# Patient Record
Sex: Male | Born: 1965 | Race: Black or African American | Hispanic: No | Marital: Single | Smoking: Current every day smoker
Health system: Southern US, Community
[De-identification: ages and names within clinical notes are randomized; demographics above are authoritative.]

## PROBLEM LIST (undated history)

## (undated) DIAGNOSIS — F329 Major depressive disorder, single episode, unspecified: Secondary | ICD-10-CM

## (undated) DIAGNOSIS — F32A Depression, unspecified: Secondary | ICD-10-CM

## (undated) DIAGNOSIS — I10 Essential (primary) hypertension: Secondary | ICD-10-CM

## (undated) DIAGNOSIS — F1410 Cocaine abuse, uncomplicated: Secondary | ICD-10-CM

## (undated) DIAGNOSIS — Z720 Tobacco use: Secondary | ICD-10-CM

## (undated) DIAGNOSIS — T63301A Toxic effect of unspecified spider venom, accidental (unintentional), initial encounter: Secondary | ICD-10-CM

## (undated) DIAGNOSIS — I219 Acute myocardial infarction, unspecified: Secondary | ICD-10-CM

## (undated) DIAGNOSIS — E785 Hyperlipidemia, unspecified: Secondary | ICD-10-CM

## (undated) DIAGNOSIS — I2510 Atherosclerotic heart disease of native coronary artery without angina pectoris: Secondary | ICD-10-CM

## (undated) HISTORY — DX: Cocaine abuse, uncomplicated: F14.10

## (undated) HISTORY — DX: Hyperlipidemia, unspecified: E78.5

## (undated) HISTORY — DX: Toxic effect of unspecified spider venom, accidental (unintentional), initial encounter: T63.301A

## (undated) HISTORY — DX: Acute myocardial infarction, unspecified: I21.9

## (undated) HISTORY — PX: HEMORRHOID SURGERY: SHX153

## (undated) HISTORY — DX: Atherosclerotic heart disease of native coronary artery without angina pectoris: I25.10

## (undated) HISTORY — DX: Major depressive disorder, single episode, unspecified: F32.9

## (undated) HISTORY — DX: Depression, unspecified: F32.A

## (undated) HISTORY — DX: Tobacco use: Z72.0

## (undated) HISTORY — PX: CARDIAC CATHETERIZATION: SHX172

## (undated) HISTORY — DX: Essential (primary) hypertension: I10

---

## 1997-12-04 ENCOUNTER — Emergency Department (HOSPITAL_COMMUNITY): Admission: EM | Admit: 1997-12-04 | Discharge: 1997-12-04 | Payer: Self-pay | Admitting: Emergency Medicine

## 2008-02-03 ENCOUNTER — Other Ambulatory Visit: Payer: Self-pay

## 2008-02-03 ENCOUNTER — Emergency Department: Payer: Self-pay | Admitting: Emergency Medicine

## 2008-02-06 ENCOUNTER — Emergency Department: Payer: Self-pay | Admitting: Emergency Medicine

## 2010-02-06 ENCOUNTER — Emergency Department: Payer: Self-pay | Admitting: Internal Medicine

## 2010-02-08 ENCOUNTER — Emergency Department: Payer: Self-pay | Admitting: Emergency Medicine

## 2011-08-25 DIAGNOSIS — I219 Acute myocardial infarction, unspecified: Secondary | ICD-10-CM

## 2011-08-25 HISTORY — PX: CARDIAC CATHETERIZATION: SHX172

## 2011-08-25 HISTORY — DX: Acute myocardial infarction, unspecified: I21.9

## 2011-09-01 ENCOUNTER — Inpatient Hospital Stay: Payer: Self-pay | Admitting: Internal Medicine

## 2011-09-01 DIAGNOSIS — I517 Cardiomegaly: Secondary | ICD-10-CM

## 2011-09-01 LAB — URINALYSIS, COMPLETE
Bacteria: NONE SEEN
Bilirubin,UR: NEGATIVE
Blood: NEGATIVE
Glucose,UR: NEGATIVE mg/dL (ref 0–75)
Ketone: NEGATIVE
Leukocyte Esterase: NEGATIVE
Nitrite: NEGATIVE
Ph: 5 (ref 4.5–8.0)
Protein: NEGATIVE
RBC,UR: <1 /HPF (ref 0–5)
Specific Gravity: 1.012 (ref 1.003–1.030)
Squamous Epithelial: <1
WBC UR: <1 /HPF (ref 0–5)

## 2011-09-01 LAB — DRUG SCREEN, URINE
Amphetamines, Ur Screen: NEGATIVE (ref ?–1000)
Barbiturates, Ur Screen: NEGATIVE (ref ?–200)
Benzodiazepine, Ur Scrn: NEGATIVE (ref ?–200)
Cannabinoid 50 Ng, Ur ~~LOC~~: NEGATIVE (ref ?–50)
Cocaine Metabolite,Ur ~~LOC~~: POSITIVE (ref ?–300)
MDMA (Ecstasy)Ur Screen: NEGATIVE (ref ?–500)
Methadone, Ur Screen: NEGATIVE (ref ?–300)
Opiate, Ur Screen: POSITIVE (ref ?–300)
Phencyclidine (PCP) Ur S: NEGATIVE (ref ?–25)
Tricyclic, Ur Screen: NEGATIVE (ref ?–1000)

## 2011-09-01 LAB — CBC
HCT: 44.7 % (ref 40.0–52.0)
HGB: 15.2 g/dL (ref 13.0–18.0)
MCH: 32.5 pg (ref 26.0–34.0)
MCHC: 34 g/dL (ref 32.0–36.0)
MCV: 96 fL (ref 80–100)
Platelet: 253 10*3/uL (ref 150–440)
RBC: 4.68 10*6/uL (ref 4.40–5.90)
RDW: 12.9 % (ref 11.5–14.5)
WBC: 9.6 10*3/uL (ref 3.8–10.6)

## 2011-09-01 LAB — BASIC METABOLIC PANEL
Anion Gap: 6 — ABNORMAL LOW (ref 7–16)
BUN: 9 mg/dL (ref 7–18)
Calcium, Total: 9.2 mg/dL (ref 8.5–10.1)
Chloride: 105 mmol/L (ref 98–107)
Co2: 28 mmol/L (ref 21–32)
Creatinine: 0.92 mg/dL (ref 0.60–1.30)
EGFR (African American): >60
EGFR (Non-African Amer.): >60
Glucose: 93 mg/dL (ref 65–99)
Osmolality: 276 (ref 275–301)
Potassium: 3.7 mmol/L (ref 3.5–5.1)
Sodium: 139 mmol/L (ref 136–145)

## 2011-09-01 LAB — CK TOTAL AND CKMB (NOT AT ARMC)
CK, Total: 349 U/L — ABNORMAL HIGH (ref 35–232)
CK, Total: 328 U/L — ABNORMAL HIGH (ref 35–232)
CK-MB: 21.2 ng/mL — ABNORMAL HIGH (ref 0.5–3.6)
CK-MB: 19.7 ng/mL — ABNORMAL HIGH (ref 0.5–3.6)

## 2011-09-01 LAB — APTT
Activated PTT: 35.8 secs (ref 23.6–35.9)
Activated PTT: 78.4 secs — ABNORMAL HIGH (ref 23.6–35.9)

## 2011-09-01 LAB — TROPONIN I
Troponin-I: 4.47 ng/mL — ABNORMAL HIGH
Troponin-I: 5.27 ng/mL — ABNORMAL HIGH

## 2011-09-02 DIAGNOSIS — I2510 Atherosclerotic heart disease of native coronary artery without angina pectoris: Secondary | ICD-10-CM

## 2011-09-02 LAB — LIPID PANEL
Cholesterol: 154 mg/dL (ref 0–200)
HDL Cholesterol: 28 mg/dL — ABNORMAL LOW (ref 40–60)
Ldl Cholesterol, Calc: 96 mg/dL (ref 0–100)
Triglycerides: 151 mg/dL (ref 0–200)
VLDL Cholesterol, Calc: 30 mg/dL (ref 5–40)

## 2011-09-02 LAB — TROPONIN I: Troponin-I: 5.95 ng/mL — ABNORMAL HIGH

## 2011-09-02 LAB — CK TOTAL AND CKMB (NOT AT ARMC)
CK, Total: 251 U/L — ABNORMAL HIGH (ref 35–232)
CK-MB: 12.3 ng/mL — ABNORMAL HIGH (ref 0.5–3.6)

## 2011-09-02 LAB — APTT
Activated PTT: 63.4 secs — ABNORMAL HIGH (ref 23.6–35.9)
Activated PTT: 56.7 secs — ABNORMAL HIGH (ref 23.6–35.9)

## 2011-09-02 LAB — MAGNESIUM: Magnesium: 2 mg/dL

## 2011-09-02 LAB — PROTIME-INR
INR: 0.9
Prothrombin Time: 12.3 secs (ref 11.5–14.7)

## 2011-09-02 LAB — HEMOGLOBIN A1C: Hemoglobin A1C: 5.3 % (ref 4.2–6.3)

## 2011-09-03 DIAGNOSIS — I214 Non-ST elevation (NSTEMI) myocardial infarction: Secondary | ICD-10-CM

## 2011-09-03 LAB — BASIC METABOLIC PANEL
Anion Gap: 6 — ABNORMAL LOW (ref 7–16)
BUN: 14 mg/dL (ref 7–18)
Calcium, Total: 8.3 mg/dL — ABNORMAL LOW (ref 8.5–10.1)
Chloride: 108 mmol/L — ABNORMAL HIGH (ref 98–107)
Co2: 26 mmol/L (ref 21–32)
Creatinine: 0.97 mg/dL (ref 0.60–1.30)
EGFR (African American): >60
EGFR (Non-African Amer.): >60
Glucose: 91 mg/dL (ref 65–99)
Osmolality: 279 (ref 275–301)
Potassium: 4 mmol/L (ref 3.5–5.1)
Sodium: 140 mmol/L (ref 136–145)

## 2011-09-03 LAB — CK TOTAL AND CKMB (NOT AT ARMC)
CK, Total: 151 U/L (ref 35–232)
CK-MB: 3.6 ng/mL (ref 0.5–3.6)

## 2011-09-05 LAB — COMPREHENSIVE METABOLIC PANEL
Albumin: 3.6 g/dL (ref 3.4–5.0)
Alkaline Phosphatase: 92 U/L (ref 50–136)
Anion Gap: 5 — ABNORMAL LOW (ref 7–16)
BUN: 11 mg/dL (ref 7–18)
Bilirubin,Total: 0.4 mg/dL (ref 0.2–1.0)
Calcium, Total: 9.3 mg/dL (ref 8.5–10.1)
Chloride: 107 mmol/L (ref 98–107)
Co2: 29 mmol/L (ref 21–32)
Creatinine: 1.16 mg/dL (ref 0.60–1.30)
EGFR (African American): >60
EGFR (Non-African Amer.): >60
Glucose: 78 mg/dL (ref 65–99)
Osmolality: 280 (ref 275–301)
Potassium: 3.9 mmol/L (ref 3.5–5.1)
SGOT(AST): 22 U/L (ref 15–37)
SGPT (ALT): 25 U/L
Sodium: 141 mmol/L (ref 136–145)
Total Protein: 7.6 g/dL (ref 6.4–8.2)

## 2011-09-05 LAB — CK TOTAL AND CKMB (NOT AT ARMC)
CK, Total: 105 U/L (ref 35–232)
CK-MB: <0.5 ng/mL — ABNORMAL LOW (ref 0.5–3.6)

## 2011-09-05 LAB — DRUG SCREEN, URINE

## 2011-09-05 LAB — CBC
HCT: 38.6 % — ABNORMAL LOW (ref 40.0–52.0)
HGB: 13.3 g/dL (ref 13.0–18.0)
MCH: 32.8 pg (ref 26.0–34.0)
MCHC: 34.4 g/dL (ref 32.0–36.0)
MCV: 95 fL (ref 80–100)
Platelet: 224 10*3/uL (ref 150–440)
RBC: 4.05 10*6/uL — ABNORMAL LOW (ref 4.40–5.90)
RDW: 13 % (ref 11.5–14.5)
WBC: 7.3 10*3/uL (ref 3.8–10.6)

## 2011-09-05 LAB — TROPONIN I
Troponin-I: 0.82 ng/mL — ABNORMAL HIGH
Troponin-I: 0.92 ng/mL — ABNORMAL HIGH

## 2011-09-05 LAB — PROTIME-INR
INR: 0.9
Prothrombin Time: 12.5 secs (ref 11.5–14.7)

## 2011-09-05 LAB — PRO B NATRIURETIC PEPTIDE: B-Type Natriuretic Peptide: 50 pg/mL (ref 0–125)

## 2011-09-06 ENCOUNTER — Inpatient Hospital Stay: Payer: Self-pay | Admitting: Internal Medicine

## 2011-09-06 DIAGNOSIS — R079 Chest pain, unspecified: Secondary | ICD-10-CM

## 2011-09-06 LAB — CK TOTAL AND CKMB (NOT AT ARMC)
CK, Total: 96 U/L (ref 35–232)
CK-MB: <0.5 ng/mL — ABNORMAL LOW (ref 0.5–3.6)

## 2011-09-06 LAB — APTT
Activated PTT: 36.3 secs — ABNORMAL HIGH (ref 23.6–35.9)
Activated PTT: 68.3 secs — ABNORMAL HIGH (ref 23.6–35.9)

## 2011-09-06 LAB — TROPONIN I: Troponin-I: 0.91 ng/mL — ABNORMAL HIGH

## 2011-09-08 ENCOUNTER — Encounter: Payer: Self-pay | Admitting: Cardiovascular Disease

## 2011-09-13 ENCOUNTER — Encounter: Payer: Self-pay | Admitting: Cardiovascular Disease

## 2011-09-13 ENCOUNTER — Ambulatory Visit (INDEPENDENT_AMBULATORY_CARE_PROVIDER_SITE_OTHER): Payer: Self-pay | Admitting: Cardiovascular Disease

## 2011-09-13 DIAGNOSIS — I10 Essential (primary) hypertension: Secondary | ICD-10-CM

## 2011-09-13 DIAGNOSIS — R079 Chest pain, unspecified: Secondary | ICD-10-CM

## 2011-09-13 DIAGNOSIS — R0602 Shortness of breath: Secondary | ICD-10-CM

## 2011-09-13 DIAGNOSIS — F1410 Cocaine abuse, uncomplicated: Secondary | ICD-10-CM

## 2011-09-13 DIAGNOSIS — Z720 Tobacco use: Secondary | ICD-10-CM

## 2011-09-13 DIAGNOSIS — F17200 Nicotine dependence, unspecified, uncomplicated: Secondary | ICD-10-CM

## 2011-09-13 DIAGNOSIS — E785 Hyperlipidemia, unspecified: Secondary | ICD-10-CM

## 2011-09-13 DIAGNOSIS — I2510 Atherosclerotic heart disease of native coronary artery without angina pectoris: Secondary | ICD-10-CM

## 2011-09-13 NOTE — Patient Instructions (Signed)
Increase Lisinopril to 40 mg once daily.  Increase Metoprolol to 25 mg twice daily.  Start Plavix 75 mg once daily once you finish Effient.   Follow up in 3 month.

## 2011-09-13 NOTE — Assessment & Plan Note (Signed)
The patient seems to be doing reasonably well after recent angioplasty in bare-metal stent placement. He had non-ST elevation myocardial infarction. Ejection fraction was normal. I had a prolonged discussion with him again about the importance of making positive changes in his life. He understands the risk of recurrent ischemic events if he does not improve his lifestyle. I recommend continuing dual antiplatelet therapy for a minimum of one month. However, he would benefit from continuing that for at least a year given that he did have non-ST elevation myocardial infarction. He is now on Effient 10 mg once daily. He will finish his supplies in about 2 weeks. After that, he can switch to Plavix 75 mg once daily which should be continued for 11 months. I asked him to start an exercise program. He can return to work in one week. He is currently unemployed.

## 2011-09-13 NOTE — Assessment & Plan Note (Signed)
I advised him to quit smoking but his prior to her to quit cocaine and alcohol use which he already did.

## 2011-09-13 NOTE — Assessment & Plan Note (Signed)
His blood pressure is still elevated. I recommend increasing the dose of metoprolol to 25 mg twice daily and the dose of lisinopril to 40 mg once daily.

## 2011-09-13 NOTE — Assessment & Plan Note (Signed)
Continue treatment with Pravachol once daily.

## 2011-09-13 NOTE — Assessment & Plan Note (Signed)
The patient quit cocaine use after his myocardial infarction. I explained to him the dangers of resuming that and interaction with some of his medications.

## 2011-09-13 NOTE — Progress Notes (Signed)
HPI   this is a 46 year old male who is here today for a followup visit. He presented early this month to [facility] with non-ST elevation myocardial infarction in the setting of cocaine use. He underwent cardiac catheterization which showed a 99% mid left circumflex stenosis with a large thrombus. There was mild disease in the LAD and RCA with normal ejection fraction. He underwent an angioplasty in bare-metal stent placement to the mid left circumflex without complications. He presented 2 days after discharge with recurrent chest pain but there was no new ECG changes and cardiac enzymes were trending down from his previous myocardial infarction. He was discharged home. He feels significantly better. He stopped using cocaine and alcohol and is trying to quit smoking. He has been taking his medications as prescribed. He still gets occasional chest tightness feeling which is mostly at rest and does not worsen with physical activities.  Allergies  Allergen Reactions  . Penicillins     rash     Current Outpatient Prescriptions on File Prior to Visit  Medication Sig Dispense Refill  . aspirin 81 MG tablet Take 81 mg by mouth daily.      Marland Kitchen lisinopril (PRINIVIL,ZESTRIL) 20 MG tablet Take 20 mg by mouth daily.      . prasugrel (EFFIENT) 10 MG TABS Take by mouth daily.      . pravastatin (PRAVACHOL) 20 MG tablet Take 20 mg by mouth daily.         Past Medical History  Diagnosis Date  . Acute MI MAY 2013  . CAD (coronary artery disease)     NSTEMI in 05/13 in setting of Cocaine use. Cath: 99% mid LCX stenosis with a large thrombus. PCI and BMS (4.0 X15 mm Vision) placement to mid LCX, LAD: 20%, RCA: 30%, EF: 60%.   . Hyperlipidemia   . Hypertension   . Tobacco abuse   . Cocaine abuse     quit in 08/2011     Past Surgical History  Procedure Date  . Hemorrhoid surgery   . Cardiac catheterization MAY 2013    s/p stent @ [facility]     Family History  Problem Relation Age of Onset  . Heart  attack Maternal Grandfather   . Heart attack Paternal Grandfather      History   Social History  . Marital Status: Married    Spouse Name: N/A    Number of Children: N/A  . Years of Education: N/A   Occupational History  . Not on file.   Social History Main Topics  . Smoking status: Current Everyday Smoker -- 0.2 packs/day for 30 years    Types: Cigarettes  . Smokeless tobacco: Not on file  . Alcohol Use: No  . Drug Use: No     used cocaine 15-20 years  . Sexually Active: Yes    Birth Control/ Protection: Other-see comments   Other Topics Concern  . Not on file   Social History Narrative  . No narrative on file     PHYSICAL EXAM   BP 143/93  Pulse 61  Ht 5\' 10"  (1.778 m)  Wt 156 lb (70.761 kg)  BMI 22.38 kg/m2 Constitutional: He is oriented to person, place, and time. He appears well-developed and well-nourished. No distress.  HENT: No nasal discharge.  Head: Normocephalic and atraumatic.  Eyes: Pupils are equal and round. Right eye exhibits no discharge. Left eye exhibits no discharge.  Neck: Normal range of motion. Neck supple. No JVD present. No thyromegaly  present.  Cardiovascular: Normal rate, regular rhythm, normal heart sounds and. Exam reveals no gallop and no friction rub. No murmur heard.  Pulmonary/Chest: Effort normal and breath sounds normal. No stridor. No respiratory distress. He has no wheezes. He has no rales. He exhibits no tenderness.  Abdominal: Soft. Bowel sounds are normal. He exhibits no distension. There is no tenderness. There is no rebound and no guarding.  Musculoskeletal: Normal range of motion. He exhibits no edema and no tenderness.  Neurological: He is alert and oriented to person, place, and time. Coordination normal.  Skin: Skin is warm and dry. No rash noted. He is not diaphoretic. No erythema. No pallor.  Psychiatric: He has a normal mood and affect. His behavior is normal. Judgment and thought content normal.  Right radial  pulse is normal with no hematoma.     EKG: Sinus  Rhythm  -  Negative T-waves  -Possible  Anterolateral  ischemia. (chronic changes).    ASSESSMENT AND PLAN

## 2011-10-26 ENCOUNTER — Emergency Department: Payer: Self-pay | Admitting: Emergency Medicine

## 2011-10-26 LAB — DRUG SCREEN, URINE
Amphetamines, Ur Screen: NEGATIVE (ref ?–1000)
Barbiturates, Ur Screen: NEGATIVE (ref ?–200)
Benzodiazepine, Ur Scrn: NEGATIVE (ref ?–200)
Cannabinoid 50 Ng, Ur ~~LOC~~: NEGATIVE (ref ?–50)
Cocaine Metabolite,Ur ~~LOC~~: POSITIVE (ref ?–300)
MDMA (Ecstasy)Ur Screen: NEGATIVE (ref ?–500)
Methadone, Ur Screen: NEGATIVE (ref ?–300)
Opiate, Ur Screen: NEGATIVE (ref ?–300)
Phencyclidine (PCP) Ur S: NEGATIVE (ref ?–25)
Tricyclic, Ur Screen: NEGATIVE (ref ?–1000)

## 2011-10-26 LAB — COMPREHENSIVE METABOLIC PANEL
Albumin: 3.7 g/dL (ref 3.4–5.0)
Alkaline Phosphatase: 80 U/L (ref 50–136)
Anion Gap: 7 (ref 7–16)
BUN: 12 mg/dL (ref 7–18)
Bilirubin,Total: 1.2 mg/dL — ABNORMAL HIGH (ref 0.2–1.0)
Calcium, Total: 8.9 mg/dL (ref 8.5–10.1)
Chloride: 107 mmol/L (ref 98–107)
Co2: 27 mmol/L (ref 21–32)
Creatinine: 1.12 mg/dL (ref 0.60–1.30)
EGFR (African American): >60
EGFR (Non-African Amer.): >60
Glucose: 146 mg/dL — ABNORMAL HIGH (ref 65–99)
Osmolality: 284 (ref 275–301)
Potassium: 3 mmol/L — ABNORMAL LOW (ref 3.5–5.1)
SGOT(AST): 17 U/L (ref 15–37)
SGPT (ALT): 15 U/L
Sodium: 141 mmol/L (ref 136–145)
Total Protein: 7 g/dL (ref 6.4–8.2)

## 2011-10-26 LAB — CBC
HCT: 39.8 % — ABNORMAL LOW (ref 40.0–52.0)
HGB: 13.5 g/dL (ref 13.0–18.0)
MCH: 32.2 pg (ref 26.0–34.0)
MCHC: 33.9 g/dL (ref 32.0–36.0)
MCV: 95 fL (ref 80–100)
Platelet: 222 10*3/uL (ref 150–440)
RBC: 4.19 10*6/uL — ABNORMAL LOW (ref 4.40–5.90)
RDW: 13.6 % (ref 11.5–14.5)
WBC: 7.3 10*3/uL (ref 3.8–10.6)

## 2011-10-26 LAB — TROPONIN I
Troponin-I: <0.02 ng/mL
Troponin-I: <0.02 ng/mL

## 2011-10-26 LAB — CK TOTAL AND CKMB (NOT AT ARMC)
CK, Total: 151 U/L (ref 35–232)
CK-MB: 0.8 ng/mL (ref 0.5–3.6)

## 2011-10-26 LAB — PROTIME-INR
INR: 0.9
Prothrombin Time: 12.6 secs (ref 11.5–14.7)

## 2011-12-09 ENCOUNTER — Encounter: Payer: Self-pay | Admitting: Cardiovascular Disease

## 2011-12-09 ENCOUNTER — Ambulatory Visit (INDEPENDENT_AMBULATORY_CARE_PROVIDER_SITE_OTHER): Payer: Medicaid Other | Admitting: Cardiovascular Disease

## 2011-12-09 VITALS — BP 128/96 | HR 55 | Ht 70.0 in | Wt 156.0 lb

## 2011-12-09 DIAGNOSIS — E785 Hyperlipidemia, unspecified: Secondary | ICD-10-CM

## 2011-12-09 DIAGNOSIS — I10 Essential (primary) hypertension: Secondary | ICD-10-CM

## 2011-12-09 DIAGNOSIS — I2510 Atherosclerotic heart disease of native coronary artery without angina pectoris: Secondary | ICD-10-CM

## 2011-12-09 DIAGNOSIS — R079 Chest pain, unspecified: Secondary | ICD-10-CM

## 2011-12-09 DIAGNOSIS — R0602 Shortness of breath: Secondary | ICD-10-CM

## 2011-12-09 NOTE — Patient Instructions (Addendum)
Your physician has requested that you have an echocardiogram. Echocardiography is a painless test that uses sound waves to create images of your heart. It provides your doctor with information about the size and shape of your heart and how well your heart's chambers and valves are working. This procedure takes approximately one hour. There are no restrictions for this procedure.  Your physician recommends that you continue on your current medications as directed. Please refer to the Current Medication list given to you today.  Your physician recommends that you schedule a follow-up appointment in: 3 months with Dr. Kirke Corin

## 2011-12-10 ENCOUNTER — Encounter: Payer: Self-pay | Admitting: Cardiovascular Disease

## 2011-12-10 DIAGNOSIS — R0602 Shortness of breath: Secondary | ICD-10-CM | POA: Insufficient documentation

## 2011-12-10 LAB — BASIC METABOLIC PANEL
BUN/Creatinine Ratio: 14 (ref 9–20)
BUN: 14 mg/dL (ref 6–24)
CO2: 21 mmol/L (ref 19–28)
Calcium: 9.3 mg/dL (ref 8.7–10.2)
Chloride: 102 mmol/L (ref 97–108)
Creatinine, Ser: 1.02 mg/dL (ref 0.76–1.27)
GFR calc Af Amer: 100 mL/min/{1.73_m2} (ref >59)
GFR calc non Af Amer: 87 mL/min/{1.73_m2} (ref >59)
Glucose: 76 mg/dL (ref 65–99)
Potassium: 3.8 mmol/L (ref 3.5–5.2)
Sodium: 138 mmol/L (ref 134–144)

## 2011-12-10 LAB — LIPID PANEL
Chol/HDL Ratio: 4.4 ratio units (ref 0.0–5.0)
Cholesterol, Total: 174 mg/dL (ref 100–199)
HDL: 40 mg/dL (ref >39)
LDL Calculated: 114 mg/dL — ABNORMAL HIGH (ref 0–99)
Triglycerides: 101 mg/dL (ref 0–149)
VLDL Cholesterol Cal: 20 mg/dL (ref 5–40)

## 2011-12-10 LAB — HEPATIC FUNCTION PANEL
ALT: 12 IU/L (ref 0–44)
AST: 18 IU/L (ref 0–40)
Albumin: 4.4 g/dL (ref 3.5–5.5)
Alkaline Phosphatase: 72 IU/L (ref 44–102)
Bilirubin, Direct: 0.28 mg/dL (ref 0.00–0.40)
Total Bilirubin: 1.2 mg/dL (ref 0.0–1.2)
Total Protein: 7 g/dL (ref 6.0–8.5)

## 2011-12-10 NOTE — Assessment & Plan Note (Signed)
The patient seems to be doing reasonably well . Ejection fraction was normal. Continue dual antiplatelet therapy for one year. No clear chest pain but does complain of increased dyspnea.

## 2011-12-10 NOTE — Progress Notes (Signed)
HPI  this is a 46 year old male who is here today for a followup visit. He presented in May of this year to [facility] with non-ST elevation myocardial infarction in the setting of cocaine use. He underwent cardiac catheterization which showed a 99% mid left circumflex stenosis with a large thrombus. There was mild disease in the LAD and RCA with normal ejection fraction. He underwent an angioplasty and bare-metal stent placement to the mid left circumflex without complications. He presented 2 days after discharge with recurrent chest pain but there was no new ECG changes and cardiac enzymes were trending down from his previous myocardial infarction. He was discharged home. No events since then. He stopped using cocaine and alcohol and is trying to quit smoking. He has been taking his medications as prescribed. He was able to get Medicaid recently. Overall, his chest pain is now very rare. However, he complains of increased dyspnea and fatigue. It seems that he has been taking 2 different prescriptions for metoprolol by mistake. One of them was for 25 mg twice daily and the other was for 50 mg twice daily.   Allergies  Allergen Reactions  . Penicillins     rash     Current Outpatient Prescriptions on File Prior to Visit  Medication Sig Dispense Refill  . aspirin 81 MG tablet Take 81 mg by mouth daily.      . pravastatin (PRAVACHOL) 20 MG tablet Take 20 mg by mouth daily.         Past Medical History  Diagnosis Date  . Acute MI MAY 2013  . CAD (coronary artery disease)     NSTEMI in 05/13 in setting of Cocaine use. Cath: 99% mid LCX stenosis with a large thrombus. PCI and BMS (4.0 X15 mm Vision) placement to mid LCX, LAD: 20%, RCA: 30%, EF: 60%.   . Hyperlipidemia   . Hypertension   . Tobacco abuse   . Cocaine abuse     quit in 08/2011  . Spider bite      Past Surgical History  Procedure Date  . Hemorrhoid surgery   . Cardiac catheterization MAY 2013    s/p stent @ [facility]      Family History  Problem Relation Age of Onset  . Heart attack Maternal Grandfather   . Heart attack Paternal Grandfather      History   Social History  . Marital Status: Married    Spouse Name: N/A    Number of Children: N/A  . Years of Education: N/A   Occupational History  . Not on file.   Social History Main Topics  . Smoking status: Current Everyday Smoker -- 0.2 packs/day for 30 years    Types: Cigarettes  . Smokeless tobacco: Not on file  . Alcohol Use: 0.0 oz/week    1-2 Cans of beer per week  . Drug Use: No     used cocaine 15-20 years  . Sexually Active: Yes    Birth Control/ Protection: Other-see comments   Other Topics Concern  . Not on file   Social History Narrative  . No narrative on file     PHYSICAL EXAM   BP 128/96  Pulse 55  Ht 5\' 10"  (1.778 m)  Wt 156 lb (70.761 kg)  BMI 22.38 kg/m2  Constitutional: He is oriented to person, place, and time. He appears well-developed and well-nourished. No distress.  HENT: No nasal discharge.  Head: Normocephalic and atraumatic.  Eyes: Pupils are equal and round. Right  eye exhibits no discharge. Left eye exhibits no discharge.  Neck: Normal range of motion. Neck supple. No JVD present. No thyromegaly present.  Cardiovascular: Normal rate, regular rhythm, normal heart sounds and. Exam reveals no gallop and no friction rub. No murmur heard.  Pulmonary/Chest: Effort normal and breath sounds normal. No stridor. No respiratory distress. He has no wheezes. He has no rales. He exhibits no tenderness.  Abdominal: Soft. Bowel sounds are normal. He exhibits no distension. There is no tenderness. There is no rebound and no guarding.  Musculoskeletal: Normal range of motion. He exhibits no edema and no tenderness.  Neurological: He is alert and oriented to person, place, and time. Coordination normal.  Skin: Skin is warm and dry. No rash noted. He is not diaphoretic. No erythema. No pallor.  Psychiatric: He has  a normal mood and affect. His behavior is normal. Judgment and thought content normal.      EKG: Sinus  Bradycardia  -RSR(V1) -nondiagnostic.   Voltage criteria for LVH  (R(V5) exceeds 2.60 mV).   -  Nonspecific T-abnormality.   ABNORMAL    ASSESSMENT AND PLAN

## 2011-12-10 NOTE — Assessment & Plan Note (Signed)
I recommend fasting lipid and liver profile. He will likely need a more potent statin than pravastatin.

## 2011-12-10 NOTE — Assessment & Plan Note (Signed)
His blood pressure is well controlled but he is mildly bradycardic. He has been taking 75 mg twice daily of Lopressor by mistake. I will decrease the dose back to 50 mg twice daily.

## 2011-12-10 NOTE — Assessment & Plan Note (Addendum)
He reports increased dyspnea and fatigue. I will obtain an echocardiogram.

## 2011-12-13 ENCOUNTER — Other Ambulatory Visit: Payer: Medicaid Other

## 2011-12-14 ENCOUNTER — Encounter: Payer: Self-pay | Admitting: Cardiovascular Disease

## 2011-12-16 ENCOUNTER — Telehealth: Payer: Self-pay

## 2011-12-16 DIAGNOSIS — Z79899 Other long term (current) drug therapy: Secondary | ICD-10-CM

## 2011-12-16 DIAGNOSIS — E785 Hyperlipidemia, unspecified: Secondary | ICD-10-CM

## 2011-12-16 MED ORDER — ATORVASTATIN CALCIUM 40 MG PO TABS: 40.0000 mg | ORAL_TABLET | Freq: Every day | ORAL | Status: DC

## 2011-12-16 NOTE — Telephone Encounter (Signed)
Message copied by Festus Aloe on Thu Dec 16, 2011  2:05 PM ------      Message from: Lorine Bears A      Created: Fri Dec 10, 2011 11:25 AM       Inform patient that labs were normal. Cholesterol was high and not at target. Stop pravastatin. Start atorvastatin 40 mg at bedtime. 6 refills. Repeat fasting lipid and liver profile in 6 weeks.

## 2011-12-16 NOTE — Telephone Encounter (Signed)
Rx sent to [facility] for Atorvastatin 40 mg take one tablet at bedtime.

## 2012-01-10 ENCOUNTER — Other Ambulatory Visit: Payer: Medicaid Other

## 2012-01-10 DIAGNOSIS — R0989 Other specified symptoms and signs involving the circulatory and respiratory systems: Secondary | ICD-10-CM

## 2012-01-18 ENCOUNTER — Emergency Department: Payer: Self-pay | Admitting: Emergency Medicine

## 2012-01-18 LAB — BASIC METABOLIC PANEL
Anion Gap: 6 — ABNORMAL LOW (ref 7–16)
BUN: 10 mg/dL (ref 7–18)
Calcium, Total: 9.2 mg/dL (ref 8.5–10.1)
Chloride: 107 mmol/L (ref 98–107)
Co2: 29 mmol/L (ref 21–32)
Creatinine: 1.09 mg/dL (ref 0.60–1.30)
EGFR (African American): >60
EGFR (Non-African Amer.): >60
Glucose: 126 mg/dL — ABNORMAL HIGH (ref 65–99)
Osmolality: 284 (ref 275–301)
Potassium: 3.5 mmol/L (ref 3.5–5.1)
Sodium: 142 mmol/L (ref 136–145)

## 2012-01-18 LAB — URINALYSIS, COMPLETE
Bacteria: NONE SEEN
Bilirubin,UR: NEGATIVE
Blood: NEGATIVE
Glucose,UR: NEGATIVE mg/dL (ref 0–75)
Ketone: NEGATIVE
Leukocyte Esterase: NEGATIVE
Nitrite: NEGATIVE
Ph: 6 (ref 4.5–8.0)
Protein: NEGATIVE
RBC,UR: NONE SEEN /HPF (ref 0–5)
Specific Gravity: 1.008 (ref 1.003–1.030)
Squamous Epithelial: <1
WBC UR: NONE SEEN /HPF (ref 0–5)

## 2012-01-18 LAB — CBC
HCT: 42.5 % (ref 40.0–52.0)
HGB: 14.7 g/dL (ref 13.0–18.0)
MCH: 32.6 pg (ref 26.0–34.0)
MCHC: 34.5 g/dL (ref 32.0–36.0)
MCV: 94 fL (ref 80–100)
Platelet: 249 10*3/uL (ref 150–440)
RBC: 4.5 10*6/uL (ref 4.40–5.90)
RDW: 13.5 % (ref 11.5–14.5)
WBC: 7.3 10*3/uL (ref 3.8–10.6)

## 2012-01-27 ENCOUNTER — Encounter: Payer: Self-pay | Admitting: Cardiovascular Disease

## 2012-01-28 LAB — CBC
HCT: 40.4 % (ref 40.0–52.0)
HGB: 14.1 g/dL (ref 13.0–18.0)
MCH: 33.2 pg (ref 26.0–34.0)
MCHC: 35 g/dL (ref 32.0–36.0)
MCV: 95 fL (ref 80–100)
Platelet: 251 10*3/uL (ref 150–440)
RBC: 4.25 10*6/uL — ABNORMAL LOW (ref 4.40–5.90)
RDW: 13 % (ref 11.5–14.5)
WBC: 7.6 10*3/uL (ref 3.8–10.6)

## 2012-01-28 LAB — COMPREHENSIVE METABOLIC PANEL
Albumin: 3.6 g/dL (ref 3.4–5.0)
Alkaline Phosphatase: 89 U/L (ref 50–136)
Anion Gap: 10 (ref 7–16)
BUN: 12 mg/dL (ref 7–18)
Bilirubin,Total: 0.8 mg/dL (ref 0.2–1.0)
Calcium, Total: 8.9 mg/dL (ref 8.5–10.1)
Chloride: 107 mmol/L (ref 98–107)
Co2: 25 mmol/L (ref 21–32)
Creatinine: 1.08 mg/dL (ref 0.60–1.30)
EGFR (African American): >60
EGFR (Non-African Amer.): >60
Glucose: 73 mg/dL (ref 65–99)
Osmolality: 281 (ref 275–301)
Potassium: 3.6 mmol/L (ref 3.5–5.1)
SGOT(AST): 20 U/L (ref 15–37)
SGPT (ALT): 13 U/L (ref 12–78)
Sodium: 142 mmol/L (ref 136–145)
Total Protein: 7 g/dL (ref 6.4–8.2)

## 2012-01-28 LAB — DRUG SCREEN, URINE
Amphetamines, Ur Screen: NEGATIVE (ref ?–1000)
Barbiturates, Ur Screen: NEGATIVE (ref ?–200)
Benzodiazepine, Ur Scrn: NEGATIVE (ref ?–200)
Cannabinoid 50 Ng, Ur ~~LOC~~: NEGATIVE (ref ?–50)
Cocaine Metabolite,Ur ~~LOC~~: POSITIVE (ref ?–300)
MDMA (Ecstasy)Ur Screen: NEGATIVE (ref ?–500)
Methadone, Ur Screen: NEGATIVE (ref ?–300)
Opiate, Ur Screen: NEGATIVE (ref ?–300)
Phencyclidine (PCP) Ur S: NEGATIVE (ref ?–25)
Tricyclic, Ur Screen: NEGATIVE (ref ?–1000)

## 2012-01-28 LAB — URINALYSIS, COMPLETE
Bacteria: NONE SEEN
Bilirubin,UR: NEGATIVE
Blood: NEGATIVE
Glucose,UR: NEGATIVE mg/dL (ref 0–75)
Ketone: NEGATIVE
Leukocyte Esterase: NEGATIVE
Nitrite: NEGATIVE
Ph: 6 (ref 4.5–8.0)
Protein: NEGATIVE
RBC,UR: 1 /HPF (ref 0–5)
Specific Gravity: 1.03 (ref 1.003–1.030)
Squamous Epithelial: 1
WBC UR: 1 /HPF (ref 0–5)

## 2012-01-28 LAB — TSH: Thyroid Stimulating Horm: 1.54 u[IU]/mL

## 2012-01-28 LAB — ETHANOL
Ethanol %: <0.003 % (ref 0.000–0.080)
Ethanol: <3 mg/dL

## 2012-01-29 ENCOUNTER — Inpatient Hospital Stay: Payer: Self-pay | Admitting: Psychiatry

## 2012-02-01 ENCOUNTER — Other Ambulatory Visit: Payer: Medicaid Other

## 2012-02-01 LAB — FOLATE: Folic Acid: 13.9 ng/mL (ref 3.1–100.0)

## 2012-02-01 LAB — LIPID PANEL
Cholesterol: 134 mg/dL (ref 0–200)
HDL Cholesterol: 32 mg/dL — ABNORMAL LOW (ref 40–60)
Ldl Cholesterol, Calc: 89 mg/dL (ref 0–100)
Triglycerides: 67 mg/dL (ref 0–200)
VLDL Cholesterol, Calc: 13 mg/dL (ref 5–40)

## 2012-02-10 ENCOUNTER — Telehealth: Payer: Self-pay | Admitting: Cardiovascular Disease

## 2012-02-10 NOTE — Telephone Encounter (Signed)
Pt needs a letter stating he is able to do physical work about 20hrs worth.  He needs this letter faxed to [phone] Attn to Lorinda Creed.  Please call pt if any questions.

## 2012-02-10 NOTE — Telephone Encounter (Signed)
Is this ok with you  ?

## 2012-02-13 ENCOUNTER — Emergency Department: Payer: Self-pay | Admitting: Emergency Medicine

## 2012-02-13 LAB — COMPREHENSIVE METABOLIC PANEL
Albumin: 4.3 g/dL (ref 3.4–5.0)
Alkaline Phosphatase: 100 U/L (ref 50–136)
Anion Gap: 12 (ref 7–16)
BUN: 18 mg/dL (ref 7–18)
Bilirubin,Total: 1.5 mg/dL — ABNORMAL HIGH (ref 0.2–1.0)
Calcium, Total: 9.2 mg/dL (ref 8.5–10.1)
Chloride: 105 mmol/L (ref 98–107)
Co2: 23 mmol/L (ref 21–32)
Creatinine: 1.02 mg/dL (ref 0.60–1.30)
EGFR (African American): >60
EGFR (Non-African Amer.): >60
Glucose: 80 mg/dL (ref 65–99)
Osmolality: 280 (ref 275–301)
Potassium: 4.2 mmol/L (ref 3.5–5.1)
SGOT(AST): 32 U/L (ref 15–37)
SGPT (ALT): 25 U/L (ref 12–78)
Sodium: 140 mmol/L (ref 136–145)
Total Protein: 7.8 g/dL (ref 6.4–8.2)

## 2012-02-13 LAB — CBC
HCT: 41.6 % (ref 40.0–52.0)
HGB: 14.3 g/dL (ref 13.0–18.0)
MCH: 32.3 pg (ref 26.0–34.0)
MCHC: 34.3 g/dL (ref 32.0–36.0)
MCV: 94 fL (ref 80–100)
Platelet: 245 10*3/uL (ref 150–440)
RBC: 4.43 10*6/uL (ref 4.40–5.90)
RDW: 13.4 % (ref 11.5–14.5)
WBC: 14.5 10*3/uL — ABNORMAL HIGH (ref 3.8–10.6)

## 2012-02-13 LAB — TROPONIN I
Troponin-I: <0.02 ng/mL
Troponin-I: <0.02 ng/mL

## 2012-02-13 NOTE — Telephone Encounter (Signed)
Have him come for a follow up visit to discuss this. I think he was recently hospitalized at [facility] and need to know more details.

## 2012-02-14 NOTE — Telephone Encounter (Signed)
I spoke with Pt's father, who says pt is not at home right now. He will get in touch with pt and have him contact us.

## 2012-02-14 NOTE — Telephone Encounter (Signed)
Pt walked in office, requesting a note needed for drug rehab program. Says he needs this faxed today. He understands Dr. Kirke Corin wanted to see him first but he is unable to wait for an appt. Says he is going to participate in a church sponsored rehab called "[facility]" and they need a letter faxed from Dr. Kirke Corin stating he may participate 20 hours/week light duty.    I discussed this with Dr. Kirke Corin in office. He gave the ok to give pt a letter of clearance with no heavy lifting.   Letter given to pt Also faxed to [facility] at # provided.

## 2012-02-15 ENCOUNTER — Ambulatory Visit: Payer: Medicaid Other | Admitting: Cardiovascular Disease

## 2012-02-15 ENCOUNTER — Encounter: Payer: Self-pay | Admitting: Cardiovascular Disease

## 2012-02-15 ENCOUNTER — Ambulatory Visit (INDEPENDENT_AMBULATORY_CARE_PROVIDER_SITE_OTHER): Payer: Medicaid Other | Admitting: Cardiovascular Disease

## 2012-02-15 VITALS — BP 110/80 | HR 56 | Ht 70.0 in | Wt 157.0 lb

## 2012-02-15 DIAGNOSIS — E785 Hyperlipidemia, unspecified: Secondary | ICD-10-CM

## 2012-02-15 DIAGNOSIS — F17200 Nicotine dependence, unspecified, uncomplicated: Secondary | ICD-10-CM

## 2012-02-15 DIAGNOSIS — Z720 Tobacco use: Secondary | ICD-10-CM

## 2012-02-15 DIAGNOSIS — I2510 Atherosclerotic heart disease of native coronary artery without angina pectoris: Secondary | ICD-10-CM

## 2012-02-15 DIAGNOSIS — R079 Chest pain, unspecified: Secondary | ICD-10-CM

## 2012-02-15 DIAGNOSIS — F1410 Cocaine abuse, uncomplicated: Secondary | ICD-10-CM

## 2012-02-15 DIAGNOSIS — I10 Essential (primary) hypertension: Secondary | ICD-10-CM

## 2012-02-15 NOTE — Assessment & Plan Note (Signed)
The patient has known history of coronary artery disease with previous myocardial infarction and bare-metal stent placement to the mid left circumflex. He is having recurrent chest pain with some atypical features. There is a possibility of cocaine-induced chest pain given his relapse. I recommend evaluation with a treadmill nuclear stress test for further evaluation. A treadmill stress test alone is not sufficient due to significant T wave changes in the anterior and anterolateral leads. Continue current medications.

## 2012-02-15 NOTE — Assessment & Plan Note (Signed)
I advised him to quit smoking.  

## 2012-02-15 NOTE — Assessment & Plan Note (Signed)
Continue pravastatin for now. I will likely switch him to atorvastatin in the future.

## 2012-02-15 NOTE — Patient Instructions (Addendum)
Your physician has requested that you have en exercise stress myoview. For further information please visit [email or web address] Please follow instruction sheet, as given.  Do not take Metoprolol the morning of stress test.  Follow up in 3 months. Bring your medications to appointment.

## 2012-02-15 NOTE — Assessment & Plan Note (Signed)
I had a prolonged discussion with the patient about the importance of quitting cocaine use. He is at significant risk for future cardiovascular events if he continues.

## 2012-02-15 NOTE — Assessment & Plan Note (Signed)
His blood pressure is reasonably controlled. 

## 2012-02-15 NOTE — Progress Notes (Signed)
HPI  this is a 46 year old [place] American male who is here today for a followup visit. He presented in May of this year to [facility] with non-ST elevation myocardial infarction in the setting of cocaine use. He underwent cardiac catheterization which showed a 99% mid left circumflex stenosis with a large thrombus. There was mild disease in the LAD and RCA with normal ejection fraction. He underwent an angioplasty and bare-metal stent placement to the mid left circumflex without complications. He was hospitalized recently at [facility] for depression. Unfortunately, he resumed using cocaine and continues to smoke. He continued to have intermittent episode of chest pain after his myocardial infarction. However, he had worsening symptoms recently and went to the emergency room at [facility] over the weekend due to substernal chest tightness which lasted for about an hour. His cardiac enzymes were negative. ECG showed anterolateral T wave changes which were not new. The patient complains of feeling tired and has no energy. He has significant exertional dyspnea continues to have intermittent chest pain.  Allergies  Allergen Reactions  . Penicillins     rash     Current Outpatient Prescriptions on File Prior to Visit  Medication Sig Dispense Refill  . aspirin 81 MG tablet Take 81 mg by mouth daily.      . clopidogrel (PLAVIX) 75 MG tablet Take 75 mg by mouth daily.      Marland Kitchen lisinopril (PRINIVIL,ZESTRIL) 40 MG tablet Take 40 mg by mouth daily.      . metoprolol (LOPRESSOR) 50 MG tablet Take 50 mg by mouth 2 (two) times daily.         Past Medical History  Diagnosis Date  . Acute MI MAY 2013  . CAD (coronary artery disease)     NSTEMI in 05/13 in setting of Cocaine use. Cath: 99% mid LCX stenosis with a large thrombus. PCI and BMS (4.0 X15 mm Vision) placement to mid LCX, LAD: 20%, RCA: 30%, EF: 60%.   . Hyperlipidemia   . Hypertension   . Tobacco abuse   . Cocaine abuse     quit in 08/2011  . Spider bite    . Depression      Past Surgical History  Procedure Date  . Hemorrhoid surgery   . Cardiac catheterization MAY 2013    s/p stent @ [facility]     Family History  Problem Relation Age of Onset  . Heart attack Maternal Grandfather   . Heart attack Paternal Grandfather      History   Social History  . Marital Status: Married    Spouse Name: N/A    Number of Children: N/A  . Years of Education: N/A   Occupational History  . Not on file.   Social History Main Topics  . Smoking status: Current Every Day Smoker -- 0.2 packs/day for 30 years    Types: Cigarettes  . Smokeless tobacco: Not on file  . Alcohol Use: 0.0 oz/week    1-2 Cans of beer per week  . Drug Use: No     used cocaine 15-20 years  . Sexually Active: Yes    Birth Control/ Protection: Other-see comments   Other Topics Concern  . Not on file   Social History Narrative  . No narrative on file     PHYSICAL EXAM   BP 110/80  Pulse 56  Ht 5\' 10"  (1.778 m)  Wt 157 lb (71.215 kg)  BMI 22.53 kg/m2  Constitutional: He is oriented to person, place, and  time. He appears well-developed and well-nourished. No distress.  HENT: No nasal discharge.  Head: Normocephalic and atraumatic.  Eyes: Pupils are equal and round. Right eye exhibits no discharge. Left eye exhibits no discharge.  Neck: Normal range of motion. Neck supple. No JVD present. No thyromegaly present.  Cardiovascular: Normal rate, regular rhythm, normal heart sounds and. Exam reveals no gallop and no friction rub. No murmur heard.  Pulmonary/Chest: Effort normal and breath sounds normal. No stridor. No respiratory distress. He has no wheezes. He has no rales. He exhibits no tenderness.  Abdominal: Soft. Bowel sounds are normal. He exhibits no distension. There is no tenderness. There is no rebound and no guarding.  Musculoskeletal: Normal range of motion. He exhibits no edema and no tenderness.  Neurological: He is alert and oriented to person, place,  and time. Coordination normal.  Skin: Skin is warm and dry. No rash noted. He is not diaphoretic. No erythema. No pallor.  Psychiatric: He has a normal mood and affect. His behavior is normal. Judgment and thought content normal.      EKG: Sinus  Bradycardia  -  Negative T-waves  - Anterior and anterolateral ischemia.   ABNORMAL    ASSESSMENT AND PLAN

## 2012-02-18 ENCOUNTER — Ambulatory Visit: Payer: Self-pay | Admitting: Cardiovascular Disease

## 2012-02-18 ENCOUNTER — Encounter: Payer: Self-pay | Admitting: Cardiovascular Disease

## 2012-02-18 DIAGNOSIS — R079 Chest pain, unspecified: Secondary | ICD-10-CM

## 2012-02-21 ENCOUNTER — Telehealth: Payer: Self-pay

## 2012-02-21 ENCOUNTER — Emergency Department: Payer: Self-pay | Admitting: Emergency Medicine

## 2012-02-21 LAB — CBC
HCT: 36.3 % — ABNORMAL LOW (ref 40.0–52.0)
HGB: 12.7 g/dL — ABNORMAL LOW (ref 13.0–18.0)
MCH: 33 pg (ref 26.0–34.0)
MCHC: 34.9 g/dL (ref 32.0–36.0)
MCV: 95 fL (ref 80–100)
Platelet: 238 10*3/uL (ref 150–440)
RBC: 3.84 10*6/uL — ABNORMAL LOW (ref 4.40–5.90)
RDW: 12.9 % (ref 11.5–14.5)
WBC: 6.4 10*3/uL (ref 3.8–10.6)

## 2012-02-21 LAB — BASIC METABOLIC PANEL
Anion Gap: 8 (ref 7–16)
BUN: 12 mg/dL (ref 7–18)
Calcium, Total: 8.7 mg/dL (ref 8.5–10.1)
Chloride: 109 mmol/L — ABNORMAL HIGH (ref 98–107)
Co2: 28 mmol/L (ref 21–32)
Creatinine: 0.99 mg/dL (ref 0.60–1.30)
EGFR (African American): >60
EGFR (Non-African Amer.): >60
Glucose: 73 mg/dL (ref 65–99)
Osmolality: 287 (ref 275–301)
Potassium: 4 mmol/L (ref 3.5–5.1)
Sodium: 145 mmol/L (ref 136–145)

## 2012-02-21 LAB — CK TOTAL AND CKMB (NOT AT ARMC)
CK, Total: 84 U/L (ref 35–232)
CK-MB: <0.5 ng/mL — ABNORMAL LOW (ref 0.5–3.6)

## 2012-02-21 LAB — TROPONIN I: Troponin-I: <0.02 ng/mL

## 2012-02-21 NOTE — Telephone Encounter (Signed)
Message copied by Marcelle Overlie on Mon Feb 21, 2012  7:55 AM ------      Message from: Lorine Bears A      Created: Fri Feb 18, 2012 12:41 PM       inform him that stress test was normal.

## 2012-02-22 NOTE — Telephone Encounter (Signed)
Informed pt of stress test result.

## 2012-02-25 ENCOUNTER — Encounter: Payer: Self-pay | Admitting: Cardiovascular Disease

## 2012-03-10 ENCOUNTER — Ambulatory Visit: Payer: Medicaid Other | Admitting: Cardiovascular Disease

## 2012-03-17 ENCOUNTER — Other Ambulatory Visit: Payer: Self-pay | Admitting: Cardiovascular Disease

## 2012-03-17 DIAGNOSIS — R079 Chest pain, unspecified: Secondary | ICD-10-CM

## 2012-03-17 DIAGNOSIS — I2510 Atherosclerotic heart disease of native coronary artery without angina pectoris: Secondary | ICD-10-CM

## 2012-04-07 ENCOUNTER — Ambulatory Visit: Payer: Self-pay | Admitting: Cardiovascular Disease

## 2012-05-18 ENCOUNTER — Ambulatory Visit: Payer: Medicaid Other | Admitting: Cardiovascular Disease

## 2012-05-18 ENCOUNTER — Ambulatory Visit: Payer: Self-pay | Admitting: Cardiovascular Disease

## 2012-10-05 ENCOUNTER — Observation Stay: Payer: Self-pay | Admitting: Internal Medicine

## 2012-10-05 LAB — CBC WITH DIFFERENTIAL/PLATELET
Basophil #: 0.1 10*3/uL (ref 0.0–0.1)
Basophil %: 0.7 %
Eosinophil #: 0.2 10*3/uL (ref 0.0–0.7)
Eosinophil %: 2.7 %
HCT: 41.4 % (ref 40.0–52.0)
HGB: 14.4 g/dL (ref 13.0–18.0)
Lymphocyte #: 2.2 10*3/uL (ref 1.0–3.6)
Lymphocyte %: 28.4 %
MCH: 34.3 pg — ABNORMAL HIGH (ref 26.0–34.0)
MCHC: 34.8 g/dL (ref 32.0–36.0)
MCV: 99 fL (ref 80–100)
Monocyte #: 0.6 x10 3/mm (ref 0.2–1.0)
Monocyte %: 7.4 %
Neutrophil #: 4.7 10*3/uL (ref 1.4–6.5)
Neutrophil %: 60.8 %
Platelet: 226 10*3/uL (ref 150–440)
RBC: 4.2 10*6/uL — ABNORMAL LOW (ref 4.40–5.90)
RDW: 13.2 % (ref 11.5–14.5)
WBC: 7.7 10*3/uL (ref 3.8–10.6)

## 2012-10-05 LAB — DRUG SCREEN, URINE

## 2012-10-05 LAB — PROTIME-INR
INR: 0.9
Prothrombin Time: 12.5 secs (ref 11.5–14.7)

## 2012-10-05 LAB — URINALYSIS, COMPLETE
Bacteria: NONE SEEN
Bilirubin,UR: NEGATIVE
Blood: NEGATIVE
Glucose,UR: NEGATIVE mg/dL (ref 0–75)
Hyaline Cast: 5
Ketone: NEGATIVE
Leukocyte Esterase: NEGATIVE
Nitrite: NEGATIVE
Ph: 5 (ref 4.5–8.0)
Protein: NEGATIVE
RBC,UR: <1 /HPF (ref 0–5)
Specific Gravity: 1.025 (ref 1.003–1.030)
Squamous Epithelial: 1
WBC UR: 2 /HPF (ref 0–5)

## 2012-10-05 LAB — COMPREHENSIVE METABOLIC PANEL
Albumin: 3.8 g/dL (ref 3.4–5.0)
Alkaline Phosphatase: 89 U/L (ref 50–136)
Anion Gap: 7 (ref 7–16)
BUN: 18 mg/dL (ref 7–18)
Bilirubin,Total: 0.6 mg/dL (ref 0.2–1.0)
Calcium, Total: 9.4 mg/dL (ref 8.5–10.1)
Chloride: 108 mmol/L — ABNORMAL HIGH (ref 98–107)
Co2: 24 mmol/L (ref 21–32)
Creatinine: 1 mg/dL (ref 0.60–1.30)
EGFR (African American): >60
EGFR (Non-African Amer.): >60
Glucose: 102 mg/dL — ABNORMAL HIGH (ref 65–99)
Osmolality: 280 (ref 275–301)
Potassium: 4 mmol/L (ref 3.5–5.1)
SGOT(AST): 29 U/L (ref 15–37)
SGPT (ALT): 27 U/L (ref 12–78)
Sodium: 139 mmol/L (ref 136–145)
Total Protein: 7.2 g/dL (ref 6.4–8.2)

## 2012-10-05 LAB — CK TOTAL AND CKMB (NOT AT ARMC)
CK, Total: 239 U/L — ABNORMAL HIGH (ref 35–232)
CK, Total: 194 U/L (ref 35–232)
CK-MB: 1 ng/mL (ref 0.5–3.6)
CK-MB: 0.9 ng/mL (ref 0.5–3.6)

## 2012-10-05 LAB — TROPONIN I
Troponin-I: <0.02 ng/mL
Troponin-I: <0.02 ng/mL

## 2012-12-21 ENCOUNTER — Telehealth: Payer: Self-pay

## 2012-12-21 NOTE — Telephone Encounter (Signed)
Request from Disability [employer] , sent to [employer] on 12/21/2012 .

## 2013-01-09 ENCOUNTER — Telehealth: Payer: Self-pay

## 2013-01-09 NOTE — Telephone Encounter (Signed)
Request from Disability Determination Services , sent to [employer] on 01/09/2013.   

## 2014-04-22 ENCOUNTER — Emergency Department: Payer: Self-pay | Admitting: Emergency Medicine

## 2014-04-22 LAB — URINALYSIS, COMPLETE
Bacteria: NONE SEEN
Bilirubin,UR: NEGATIVE
Blood: NEGATIVE
Glucose,UR: NEGATIVE mg/dL (ref 0–75)
Ketone: NEGATIVE
Leukocyte Esterase: NEGATIVE
Nitrite: NEGATIVE
Ph: 5 (ref 4.5–8.0)
Protein: NEGATIVE
RBC,UR: 1 /HPF (ref 0–5)
Specific Gravity: 1.017 (ref 1.003–1.030)
Squamous Epithelial: <1
WBC UR: <1 /HPF (ref 0–5)

## 2014-04-22 LAB — COMPREHENSIVE METABOLIC PANEL
Albumin: 3.6 g/dL (ref 3.4–5.0)
Alkaline Phosphatase: 84 U/L
Anion Gap: 7 (ref 7–16)
BUN: 10 mg/dL (ref 7–18)
Bilirubin,Total: 0.7 mg/dL (ref 0.2–1.0)
Calcium, Total: 8.5 mg/dL (ref 8.5–10.1)
Chloride: 106 mmol/L (ref 98–107)
Co2: 28 mmol/L (ref 21–32)
Creatinine: 1.07 mg/dL (ref 0.60–1.30)
EGFR (African American): >60
EGFR (Non-African Amer.): >60
Glucose: 88 mg/dL (ref 65–99)
Osmolality: 280 (ref 275–301)
Potassium: 3.8 mmol/L (ref 3.5–5.1)
SGOT(AST): 13 U/L — ABNORMAL LOW (ref 15–37)
SGPT (ALT): 18 U/L
Sodium: 141 mmol/L (ref 136–145)
Total Protein: 7.1 g/dL (ref 6.4–8.2)

## 2014-04-22 LAB — CBC
HCT: 45.1 % (ref 40.0–52.0)
HGB: 15.4 g/dL (ref 13.0–18.0)
MCH: 33.2 pg (ref 26.0–34.0)
MCHC: 34.1 g/dL (ref 32.0–36.0)
MCV: 98 fL (ref 80–100)
Platelet: 230 10*3/uL (ref 150–440)
RBC: 4.63 10*6/uL (ref 4.40–5.90)
RDW: 13.5 % (ref 11.5–14.5)
WBC: 7.1 10*3/uL (ref 3.8–10.6)

## 2014-04-22 LAB — TROPONIN I: Troponin-I: <0.02 ng/mL

## 2014-06-04 ENCOUNTER — Observation Stay: Payer: Self-pay | Admitting: Internal Medicine

## 2014-06-05 DIAGNOSIS — R079 Chest pain, unspecified: Secondary | ICD-10-CM

## 2014-08-13 LAB — COMPREHENSIVE METABOLIC PANEL
Albumin: 4.7 g/dL
Alkaline Phosphatase: 79 U/L
Anion Gap: 10 (ref 7–16)
BUN: 18 mg/dL
Bilirubin,Total: 1.3 mg/dL — ABNORMAL HIGH
Calcium, Total: 9.7 mg/dL
Chloride: 102 mmol/L
Co2: 28 mmol/L
Creatinine: 1.12 mg/dL
EGFR (African American): >60
EGFR (Non-African Amer.): >60
Glucose: 93 mg/dL
Potassium: 3.9 mmol/L
SGOT(AST): 23 U/L
SGPT (ALT): 17 U/L
Sodium: 140 mmol/L
Total Protein: 8.2 g/dL — ABNORMAL HIGH

## 2014-08-13 LAB — CBC
HCT: 50.4 % (ref 40.0–52.0)
HGB: 17.2 g/dL (ref 13.0–18.0)
MCH: 33.1 pg (ref 26.0–34.0)
MCHC: 34.2 g/dL (ref 32.0–36.0)
MCV: 97 fL (ref 80–100)
Platelet: 241 10*3/uL (ref 150–440)
RBC: 5.21 10*6/uL (ref 4.40–5.90)
RDW: 13.4 % (ref 11.5–14.5)
WBC: 8.5 10*3/uL (ref 3.8–10.6)

## 2014-08-13 LAB — ETHANOL: Ethanol: <5 mg/dL

## 2014-08-13 LAB — ACETAMINOPHEN LEVEL: Acetaminophen: <10 ug/mL

## 2014-08-13 LAB — SALICYLATE LEVEL: Salicylates, Serum: <4 mg/dL

## 2014-08-13 NOTE — H&P (Signed)
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 The patient is divorced since 25 after being married for two months and lives by himself in a one-bedroom house that is owned by his father.  The patient comes for his first inpatient hospitalization on psychiatry at [facility] with the chief complaint, "I started getting depressed.  I am feeling low and down.  I don't have money coming in.  I don't have a job and I am sick and I am not able to pay bills.  I called mental health and they sent me by cab for help here."  HISTORY OF PRESENT ILLNESS:  When the patient was asked when he last felt well, he reported May 2013.  He reports that he had to quit working because he could not work any more as he feels breathless since he had a massive myocardial infarction in May 2013.  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  According to information obtained from the chart, the patient presented to the Emergency Department  from [facility] with the chief complaint of depression, suicidal ideas, being paranoid, and having auditory and visual hallucinations and having feelings of hurting others and refused to state who it is and he reported that since the heart attack he had stents put in and this limits him from doing things and he can't work.  When he was walking down the street he saw a white woman and he spoke and when he got back home the police were there to bring him here for breaking into the same lady's house and she later apologized.  PAST PSYCHIATRIC  HISTORY:  No previous history of inpatient hospitalization on psychiatry.  No history of suicide attempts. Not being followed by a psychiatrist at this time.  FAMILY HISTORY OF MENTAL ILLNESS:   Brother has depression.  No known history of suicides in the family.  FAMILY HISTORY:  Raised by parents.  Father is retired after working for the school system.  Father is living at 33 years old.  Mother is living, retired, 54 years old.  He has one brother and one sister, close to family.  PERSONAL HISTORY:  Born in [place].  Dropped out in the 11th grade because, "I just dropped out.  No reason."  No GED.   WORK HISTORY:  First job was at [employer] at age 69 years.  This job lasted for a year.  Quit for a better job.  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  This job lasted for five years, quit for another job.  Last worked in May 2013 as a Administrator, Civil Service for [employer], had a myocardial infarction and had to quit working.  MILITARY HISTORY:  None.  MARRIAGES:  Married once.  Marriage lasted for two months.  Cause of divorce- could not get along.  Has a son who is 50 years old from a relationship.  He is in touch with his son.  ALCOHOL AND DRUGS:  First drink  of alcohol at 12 or 13 years.  He used to drink a lot.  He had one DWI, lost his driver's license for cocaine being positive.  Never arrested for public drunkenness.  Currently he has a drink of alcohol occasionally.  He admits that he did smoke cocaine on a regular basis for many years and went to drug rehab in 2001, stayed sober for a year from cocaine smoking and started back smoking cocaine because of stress of breaking up with a girlfriend.  Currently he smokes on an occasional basis whenever his friends give it to him.  Last smoked cocaine on 01/27/2012.  No history of IV drugs.  Does admit smoking nicotine cigarettes at the rate of 6 per day for many years.  MEDICAL HISTORY:  Has high blood pressure.  No known diabetes  mellitus.  Status post  surgery for hemorrhoids.  No major injuries.  No history of motor vehicle accidents.  Never been unconscious.  ALLERGIES:  Penicillin.  CURRENT MEDICATIONS: 1. Lisinopril 40 mg daily for high blood pressure. 2. Clopidogrel 75 mg daily. 3. Metoprolol succinate ER tablet, 50 mg daily. 4. Pravastatin 20 mg at bedtime. 5. Celexa 40 mg daily. 6. Risperdal 1 mg b.i.d. 7. Aspirin 325 mg p.o. daily.  He is being followed by [facility].  There was an initial assessment done.  It said the patient has a long history of cocaine abuse, last used it on 01/27/2012, and he was given a diagnosis of MDDseverewith psychosis.  He does not have a doctor.  [facility]. Can't remember when his last appointment was, can't remember when his next appointment is, and is to be made.  PHYSICAL EXAMINATION: VITAL SIGNS:  Temperature 97.4, pulse 80 per minute and regular, respirations 20 per minute and regular.  Blood pressure 130/90 mmHg.  HEENT:  Head is normocephalic, atraumatic.  Eyes- pupils equal, round, reactive to light and accommodation.  Fundi bilaterally benign.  EOMs visualized.  Tympanic membranes visualized, no exudate.    NECK:  Supple without any organomegaly, lymphadenopathy, or thyromegaly.   CHEST:  Normal expansion.  Normal breath sounds.   HEART:  Normal S1, S2 without murmurs or gallops.  ABDOMEN:  Soft.  No organomegaly.  Bowel sounds heard.  RECTAL:  Examination deferred.  NEURO:  Gait is normal. Romberg is negative.  Cranial nerves II-XII grossly intact.  DTRs 2+ and normal.  Plantars are normal response.  MENTAL STATUS EXAMINATION:  The patient is dressed in hospital clothes, alert and oriented to place, person, and time.  Fully aware of the situation that brought him for admission to [facility].  Affect is flat with mood depressed.  Admits feeling hopeless and helpless,  worthless and useless since he is not able to pay bills, not able to work.  Denies any suicidal or homicidal ideas, admits "not now" but he did feel like that when he was admitted and he is hopeful that he can be helped.  Denies auditory or visual hallucinations and stated "not any more".  He is not hearing any voices, but he just feels tired about the way life is going.  He could spell the word world forward and backward without any problems.  He could count money.  Regarding judgment, for a stamped and addressed envelope he reported mailbox, for a fire he said he would leave.  He knew that apples and oranges were fruits.  He knew a  car and airplane were for transportation.  He could do serial 7s.  He does admit to sleep and appetite disturbance and feeling very tired about the way things are going.   Insight and judgment guarded.  IMPRESSION: AXIS I:  Major depressive disorder, single episode with history of psychosis but currently denies auditory or visual hallucinations.  Alcohol abuse.  Cocaine dependence- abuse at this time.  Nicotine dependence.  AXIS II:  Deferred.  AXIS III:  Hypertension, high cholesterol, status post  hemorrhoid surgery- remote.  AXIS IV:  Severe.  Loss of job, occupational, financial, very limited support.  AXIS V:  GAF: 25.  PLAN:  The patient is admitted to [facility] for close observation, evaluation, and help.  He will be started back on all of his medications along with antidepressant medications to help him with his depression.  During his stay in the hospital he will be given milieu therapy and supportive counseling where coping skills will be addressed.  In addition, substance abuse issues will be addressed.  At the time of discharge social services will look into appropriate placement and probably vocational rehabilitation as the patient was working until he had a myocardial infarction in May 2013.  Appropriate follow-up  appointments will be made in the community.   ____________________________ Jannet Mantis. Guss Bunde, MD skc:bjt D: 01/29/2012 19:41:22 ET T: 01/30/2012 09:12:52 ET JOB#: [id number]  cc: Monika Salk K. Guss Bunde, MD, <Dictator> Beau Fanny MD ELECTRONICALLY SIGNED 01/30/2012 20:57

## 2014-08-14 ENCOUNTER — Inpatient Hospital Stay
Admit: 2014-08-14 | Disposition: A | Discharge status: Home / Self Care | Payer: Self-pay | Attending: Psychiatry | Admitting: Psychiatry

## 2014-08-15 LAB — LIPID PANEL
Cholesterol: 212 mg/dL — ABNORMAL HIGH
HDL Cholesterol: 28 mg/dL — ABNORMAL LOW
Ldl Cholesterol, Calc: 148 mg/dL — ABNORMAL HIGH
Triglycerides: 179 mg/dL — ABNORMAL HIGH
VLDL Cholesterol, Calc: 36 mg/dL

## 2014-08-15 LAB — HEMOGLOBIN A1C: Hemoglobin A1C: 5.2 %

## 2014-08-16 LAB — URINALYSIS, COMPLETE
Bacteria: NONE SEEN
Bilirubin,UR: NEGATIVE
Blood: NEGATIVE
Glucose,UR: NEGATIVE mg/dL (ref 0–75)
Ketone: NEGATIVE
Leukocyte Esterase: NEGATIVE
Nitrite: NEGATIVE
Ph: 5 (ref 4.5–8.0)
Protein: NEGATIVE
Specific Gravity: 1.013 (ref 1.003–1.030)

## 2014-08-16 LAB — DRUG SCREEN, URINE
Amphetamines, Ur Screen: NEGATIVE
Barbiturates, Ur Screen: NEGATIVE
Benzodiazepine, Ur Scrn: NEGATIVE
Cannabinoid 50 Ng, Ur ~~LOC~~: NEGATIVE
Cocaine Metabolite,Ur ~~LOC~~: POSITIVE
MDMA (Ecstasy)Ur Screen: NEGATIVE
Methadone, Ur Screen: NEGATIVE
Opiate, Ur Screen: NEGATIVE
Phencyclidine (PCP) Ur S: NEGATIVE
Tricyclic, Ur Screen: NEGATIVE

## 2014-08-16 NOTE — H&P (Signed)
PATIENT NAME:  Kenneth Morales, Kenneth Morales MR#:  [id number] DATE OF BIRTH:  [birth year]  DATE OF ADMISSION:  10/05/2012  PRIMARY CARE PHYSICIAN: Adrian BlackwaterShaukat Khan, MD  REFERRING PHYSICIAN: Bayard Malesandolph Brown, MD  CHIEF COMPLAINT: Chest pain.   HISTORY OF PRESENT ILLNESS: Kenneth Morales is a 49 year old African American male with past medical history of coronary artery disease status post stent placement about 1 year back, history of hypertension and bipolar disorder who presented to the Emergency Department with complaints of chest pain. The patient has previous history of cocaine use. However, the patient states has not used in the last 1 year. Continues to smoke. The patient states that the pain is on the left side of the chest which is similar to the patient he experienced with MI. The pain started at rest, radiating to the left arm. Denies having any shortness of breath, nausea, vomiting, lightheadedness. Initial work-up with cardiac enzymes and EKG was unremarkable. Concerning about the patient's previous history of coronary artery disease and concern about noncompliance, the patient will be admitted for further work-up.  PAST MEDICAL HISTORY:  1.  Carotid artery disease, status post stent placement.  2.  Hypertension.  3.  Depression. 4.  Bipolar disorder. 5.  Previous admissions for psychosis to behavioral health.  ALLERGIES: PENICILLIN.   HOME MEDICATIONS: 1.  Metoprolol tartrate 50 mg 2 times a day.  2.  Losartan 30 mg once a day.  3.  Dexlansoprazole 1 tablet once a day.  4.  Crestor 10 mg once a day.  5.  Plavix 75 mg once a day.  6.  Atorvastatin 40 mg once a day.  7.  Aspirin 81 mg once a day.   SOCIAL HISTORY: He drinks alcohol occasionally. The patient states last use of cocaine was 1 year back. Currently smokes about 1/2 pack a day.   REVIEW OF SYSTEMS: CONSTITUTIONAL: No generalized weakness, fatigue.  EYES: No change in vision.  ENT: No tinnitus or ear pain.  RESPIRATORY: No cough,  shortness of breath.  CARDIOVASCULAR: Chest pain. GASTROENTEROLOGY:  No nausea, vomiting, abdominal pain. GENITOURINARY: No dysuria or hematuria. ENDOCRINE: No polyuria or polydipsia.  HEME:  No easy bruising or bleeding.  SKIN: No rash or lesions.  MUSCULOSKELETAL: No joint pains or aches.  NEUROLOGIC: No weakness or numbness in any part of the body.  PSYCHIATRIC:  Diagnosis of bipolar disorder.   PHYSICAL EXAMINATION: GENERAL: This is a well built, well nourished, age-appropriate male lying down in the bed, not in distress.  VITAL SIGNS: Temperature 97.6, pulse 64, blood pressure 113/73, respiratory rate 64 and oxygen saturation 97% on room air.  HEENT: Head normocephalic, atraumatic. There is no sclerae icterus. Conjunctivae normal. Pupils equal and react to light. Mucous membranes moist.  NECK: Supple. No lymphadenopathy. No JVD. No carotid bruit. No thyromegaly.  HEART:  Has no focal tenderness.  LUNGS: Bilaterally clear to auscultation.  HEART: S1 and S2 regular rate and rhythm. No murmurs are heard. No pedal edema. Pulses 2+.  ABDOMEN: Bowel sounds present. Soft, nontender and nondistended. No hepatosplenomegaly.  SKIN: No rash or lesions.  LYMPHATIC: No axillary or inguinal lymphadenopathy.  MUSCULOSKELETAL: Good range of motion in all the extremities.  NEUROLOGIC: The patient is alert and oriented to place, person and time. Cranial nerves II through XII intact. Motor 5/5 in upper and lower extremities. No sensory deficits.   LABORATORY AND DIAGNOSTICS: UA negative for nitrites and leukocyte esterase. Troponin less than 0.02. CMP is completely within normal limits. CBC is completely  within normal limits. CK 239. CK-MB 1.  Chest x-ray, one view, portable: Hyperinflation. No acute cardiopulmonary disease.   EKG, 12-lead: Normal sinus rhythm with no ST-T wave abnormalities.  ASSESSMENT AND PLAN: Kenneth Morales is a 49 year old male who comes to the Emergency Department with complaints  of chest pain.  1.  Chest pain.  Considering the patient's risk factors, will further rule out with cardiac enzymes x 3. If negative, the patient will be discharged home. Continue with aspirin, Plavix, statin and beta blocker.  2.  Hypertension. Currently well controlled. Continue the home medications.  3.  Tobacco use. Counseled with the patient.  4.  Keep the patient on deep vein thrombosis prophylaxis with Lovenox.   TIME SPENT: 55 minutes. ____________________________ Susa Griffins, MD pv:sb D: 10/05/2012 08:20:39 ET T: 10/05/2012 08:57:22 ET JOB#: [id number]  cc: Susa Griffins, MD, <Dictator> Laurier Nancy, MD Clerance Lav Fintan Grater MD ELECTRONICALLY SIGNED 10/06/2012 8:01

## 2014-08-16 NOTE — Discharge Summary (Signed)
PATIENT NAME:  Kenneth Morales, Kenneth Morales MR#:  [id number] DATE OF BIRTH:  [birth year]  DATE OF ADMISSION:  10/05/2012 DATE OF DISCHARGE:  10/05/2012  ADMITTING DIAGNOSIS: Chest pain.   DISCHARGE DIAGNOSES: 1.  Chest pain felt to be atypical in nature.  2.  Coronary artery disease with previous stent placement in the past.  3.  Hypertension.  4.  Depression.  5.  Bipolar disorder.  6.  [facility]  PERTINENT LABORATORY AND DIAGNOSTICS:  Chest x-ray showed hyperinflation.   INR 0.9. Troponin less than 0.02. Glucose 102, BUN 18, creatinine 1.0, sodium 139, potassium 4.0, chloride 108, CO2 24, calcium 9.4. LFTs were normal. WBC 7.7, hemoglobin 14.4, platelet count 226, CK-MB 1.0.   EKG:  Normal sinus rhythm with minimal voltage criteria for LVH.  Nonspecific T wave changes. TUDS were negative. His subsequent troponin was negative.   HOSPITAL COURSE: Please refer to H and P done by the admitting physician. The patient is a 49 year old African American male who presented with complaint of having chest pain. He reported that the pain was worse with certain movements like raising his arm. He states that it was going on for the past few days. His symptoms were very atypical and because he continued to have these symptoms he came to the ED.  In the ER, his EKG was normal and his cardiac enzymes were negative. He did have a cardiac cath done in December 2013 which showed at the mid circumflex there was tubular 30% stenosis at this site of prior stent. Otherwise, no other significant obstruction was noted. So because he presented with these symptoms he was placed under observation. I discussed the case with Dr. Welton FlakesKhan who stated that he will see the patient right away in the office today and determine if anything further needs to be done. He is currently being discharged to follow with Dr. Welton FlakesKhan after discharge.   DISCHARGE MEDICATIONS:  1.  Metoprolol tartrate 50 mg 1  tab p.o. b.i.d. 2.  Aspirin 81 mg 1 tab p.o. daily. 3.  Crestor 10 mg at bedtime. 4.  Losartan 25 mg p.o. daily. 5.  Lansoprazole 60 mg 1 tab p.o. daily. 6.  Plavix 75 mg p.o. daily. 7.  Atorvastatin 40 mg at bedtime.   DIET: Low-sodium, low-fat, low-cholesterol.   ACTIVITY: As tolerated.   DISCHARGE FOLLOWUP:  With Dr. Welton FlakesKhan today in his office.  TIME SPENT ON DISCHARGE:  32 minutes. ____________________________ [name], MD shp:sb D: 10/05/2012 12:55:20 ET T: 10/05/2012 13:32:41 ET JOB#: [id number]  cc: Longino Trefz H. Allena KatzPatel, MD, <Dictator> Charise CarwinSHREYANG H Shomari Scicchitano MD ELECTRONICALLY SIGNED 10/07/2012 15:25

## 2014-08-18 NOTE — Discharge Summary (Signed)
PATIENT NAME:  Kenneth Morales, Kenneth Morales MR#:  [id number] DATE OF BIRTH:  1965-05-30  DATE OF ADMISSION:  09/01/2011 DATE OF DISCHARGE:  09/03/2011  DISCHARGE DIAGNOSES: Chest pain due to coronary artery disease, non-ST-elevation myocardial infarction. Stent in mid left circumflex artery. History of polysubstance abuse.   MEDICATIONS:  1. Aspirin 81 mg daily.  2. Lisinopril 20 mg daily.  3. Pravachol 20 mg daily. 4. Effient 10 mg daily.   DIET: Low sodium diet.  CONSULTATIONS: Dr. Kirke CorinArida   PROCEDURES: Angiogram. Cardiac catheterization 05/09 which showed 99% stenosis in the mid LCx. The patient also had thrombus in the left LCx. Thrombectomy was done and bare metal stent was placed in the left circumflex.  The patient is doing much better and has no chest pain.   HOSPITAL COURSE:  49 year old male with history of cocaine abuse, hypertension, and tobacco abuse who came in because of chest pressure. The patient was admitted to the hospitalist service because the patient's initial troponin was 4.47. He was admitted to the Intensive Care Unit and started on a heparin drip along with nitrates and aspirin. Beta blockers not given due to cocaine use. The patient continued on aspirin and Zocor. Second set of troponins were high at 5.95 and CK total 251 and CPK-MB 12.3. The patient was taken for cardiac catheterization yesterday by Dr. Kirke CorinArida. As mentioned, it showed ejection fraction of 60%, 99% stenosis in mid circumflex and a stent was placed. Also the patient had thrombus in the LCx. Thrombectomy was done. The patient's condition is stable. The patient is going to be discharged on aspirin and Plavix. The patient will be getting a one-week supply from an assistance program.  Also Dr. Kirke CorinArida said he will give Effient samples to him.  The patient needs to stop smoking and using cocaine. Follow up with Dr.Arida in a month.   CONDITION: Stable.   DISCHARGE VITAL SIGNS: Temperature 98, pulse 66, respirations 25,   blood pressure 109/71, sats 97% on room air.   TIME SPENT ON DISCHARGE PREPARATION: More than 30 minutes.    ____________________________ Katha HammingSnehalatha Elias Bordner, MD sk:bjt D: 09/03/2011 08:40:32 ET T: 09/03/2011 14:01:32 ET JOB#: [id number]  cc: Katha HammingSnehalatha Lilac Hoff, MD, <Dictator> Muhammad A. Kirke CorinArida, MD Katha HammingSNEHALATHA Tadeo Besecker MD ELECTRONICALLY SIGNED 09/08/2011 23:16

## 2014-08-18 NOTE — Discharge Summary (Signed)
PATIENT NAME:  Kenneth Morales, Kenneth Morales MR#:  [id number] DATE OF BIRTH:  01-03-1966  DATE OF ADMISSION:  09/06/2011 DATE OF DISCHARGE:  09/06/2011  ADMITTING DIAGNOSES: Chest pain.  DISCHARGE DIAGNOSES:  1. Chest pain felt to be due to atypical chest pain. The patient had a recent stent placed on 09/02/2011 and had single severe coronary artery disease at the left circumflex and underwent thrombectomy as well as bare metal stent.  2. Hypertension.  3. History of polysubstance abuse.  4. Status post hemorrhoidectomy.   PERTINENT LABS/EVALUATIONS: Glucose 78, BUN 11, creatinine 1.16, sodium 141, potassium 3.9, chloride 107, and CO2 29. Troponin 0.92. Urine drug screen was negative. WBC 7.3, hemoglobin 13.3, and platelet count 224. INR 0.9.   EKG shows no ST-T wave changes.   CONSULTANTS: Cardiology.   HOSPITAL COURSE: Please see history and physical done by the admitting physician. The patient is a 49 year old African American male who was recently hospitalized and discharged on 09/03/2011 after he had presented with chest pain. At that time, he underwent a cardiac catheterization revealing severe single vessel disease in the left circumflex. He underwent stent placement as well as thrombectomy. The patient tolerated the procedure well and was discharged home. He returned back when his symptoms began last night with complaint of having chest pain. The patient on presentation also was noted to have a slightly elevated blood pressure. His EKG was nonrevealing. His cardiac enzymes were elevated; however, they were trending down from last cardiac enzymes with a troponin of 5.95 on 09/02/2011. The patient was placed on the telemetry floor. Dr. Eldridge DaceMuhammed Arida who had done the catheterization and stent came and saw the patient. He did not feel that the patient had restenosis and this would be too early. Kenneth Morales's symptoms have resolved and he recommended ambulating the patient. If no further symptoms he could be  safely discharged home. The patient is asymptomatic and is stable for discharge.   DISCHARGE MEDICATIONS:  1. Aspirin 81 mg 1 tab p.o. daily.  2. Lisinopril 20 mg daily.  3. Pravachol 20 mg 1 tab p.o. daily.  4. Effient 10 mg p.o. daily to finish total course of four weeks.   5. Metoprolol tartrate 12.5 mg p.o. twice a day.   DIET: Low fat, low sodium.   ACTIVITY: As tolerated.   DISCHARGE FOLLOWUP: Followup with Dr. Eldridge DaceMuhammed Arida on 09/13/2011 as scheduled previously.    TIME SPENT: 32 minutes. ____________________________ [name], MD shp:slb D: 09/06/2011 10:20:22 ET T: 09/07/2011 12:14:22 ET JOB#: [id number]  cc: Kevan Prouty H. Allena KatzPatel, MD, <Dictator> Charise CarwinSHREYANG H Jessamy Torosyan MD ELECTRONICALLY SIGNED 09/09/2011 17:36

## 2014-08-18 NOTE — Consult Note (Signed)
General Aspect 49 year old [place] American male with history of hypertension, hemorrhoids and remote history of cocaine abuse presented to the ED with chest pain for two days (since monday night). Cardiology was consulted for NSTEMI.  He reports developing chest pain at rest on monday night, substernal, constant tight, pressure, no radiation, no exacerbation factor. No sweating, nausea, vomiting. Patient has headache but no dizziness. Pain was constant for two days until he presented to the ER.  He was found to have a high troponin level at 4.47 and was treated with aspirin, nitroglycerin drip and heparin drip and admitted for non-STEMI.   He denies recent cocaine use. Urine tox was positive for cocaine. ER physicin reported he used cocaine last week.   PAST MEDICAL HISTORY:  1. Hypertension. 2. Remote history of cocaine abuse but denies any drug abuse recently.   PAST SURGICAL HISTORY: Hemorrhoid surgery.   SOCIAL HISTORY: Patient smokes q to 5 cigarettes a day for the past 20 years. Patient has occasional alcohol drinking but denies any drug abuse recently. He has remote history of cocaine abuse.   FAMILY HISTORY: Both grandfathers had heart attack. Father had hypertension. No CVA, diabetes history.   Physical Exam:   GEN well developed, well nourished, no acute distress, thin    HEENT red conjunctivae    NECK supple    RESP normal resp effort  clear BS    CARD Regular rate and rhythm  No murmur    ABD denies tenderness  soft    LYMPH negative neck    EXTR negative edema    SKIN normal to palpation    NEURO cranial nerves intact, motor/sensory function intact    PSYCH alert, A+O to time, place, person, good insight   Review of Systems:   Subjective/Chief Complaint cherst pain, resolved in the ER    General: No Complaints    Skin: No Complaints    ENT: No Complaints    Eyes: No Complaints    Neck: No Complaints    Respiratory: No Complaints     Cardiovascular: Chest pain or discomfort    Gastrointestinal: No Complaints    Genitourinary: No Complaints    Vascular: No Complaints    Musculoskeletal: No Complaints    Neurologic: No Complaints    Hematologic: No Complaints    Endocrine: No Complaints    Psychiatric: No Complaints    Review of Systems: All other systems were reviewed and found to be negative    Medications/Allergies Reviewed Medications/Allergies reviewed     cocaine abuse:    hypertension:        Admit Diagnosis:   ELEVATION MYOCARDIAL INFARCTION: 01-Sep-2011, Active, ELEVATION MYOCARDIAL INFARCTION      Admit Reason:   Non-ST elevation myocardial infarction (NSTEMI): (410.70) Active, ICD9, Acute myocardial infarction, subendocardial infarction, episode of care unspecified  Routine Hem:  08-May-13 11:15    WBC (CBC) 9.6   RBC (CBC) 4.68   Hemoglobin (CBC) 15.2   Hematocrit (CBC) 44.7   Platelet Count (CBC) 253   MCV 96   MCH 32.5   MCHC 34.0   RDW 12.9  Routine Chem:  08-May-13 11:15    Glucose, Serum 93   BUN 9   Creatinine (comp) 0.92   Sodium, Serum 139   Potassium, Serum 3.7   Chloride, Serum 105   CO2, Serum 28   Calcium (Total), Serum 9.2   Anion Gap 6   Osmolality (calc) 276   eGFR (African American) >60  eGFR (Non-African American) >60  Cardiac:  08-May-13 11:15    Troponin I 4.47   CK, Total 349   CPK-MB, Serum 21.2  Routine Coag:  08-May-13 11:15    Activated PTT (APTT) 35.8  Cardiac:  08-May-13 20:00    Troponin I 5.27   CK, Total 328   CPK-MB, Serum 19.7   EKG:   Interpretation EKG showing NSR with rate 66 bpm, ST and T wave ABN anterolateral leads   Radiology Results: XRay:    08-May-13 13:11, Chest Portable Single View   Chest Portable Single View    REASON FOR EXAM:    chest pain  COMMENTS:       PROCEDURE: DXR - DXR PORTABLE CHEST SINGLE VIEW  - Sep 01 2011  1:11PM     RESULT: The lungs are clear. The heart and pulmonary vessels are normal.    The bony and mediastinal structures are unremarkable. There is no   effusion. There is no pneumothorax or evidence of congestive failure.    IMPRESSION:  No acute cardiopulmonary disease.    Dictation Site: 2          Verified By: Sundra Aland, M.D., MD    PCN: Other  Vital Signs/Nurse's Notes: **Vital Signs.:   09-May-13 04:00   Temperature Temperature (F) 97.5   Celsius 36.3   Temperature Source oral   Pulse Pulse 60   Respirations Respirations 17   Systolic BP Systolic BP 829   Diastolic BP (mmHg) Diastolic BP (mmHg) 85   Mean BP 97   Pulse Ox % Pulse Ox % 99   Pulse Ox Heart Rate 60     Impression 49 year old [place] American male with history of hypertension, hemorrhoids and remote history of cocaine abuse presented to the ED with chest pain for two days (since monday night). Cardiology was consulted for NSTEMI.  1) Chest pain Nonspecificn  EKG changes Elevated cardiac enz Severe chest pain x 2 days stgarting monday night, constant until arrival in ER and started on medical management. -positive for cocaine. patient denies. Smoking hx. --Will schedule for cardiac cath to evaluate underlying CAD May need bare metal stent for disease.  2) cocaine: counselled this AM He denies any recenrt use  3) HTN: Will monitor BP while inpatient   Electronic Signatures: Ida Rogue (MD)  (Signed 09-May-13 07:38)  Authored: General Aspect/Present Illness, History and Physical Exam, Review of System, Past Medical History, Health Issues, Labs, EKG , Radiology, Allergies, Vital Signs/Nurse's Notes, Impression/Plan   Last Updated: 09-May-13 07:38 by Ida Rogue (MD)

## 2014-08-18 NOTE — H&P (Signed)
PATIENT NAME:  Kenneth Morales, Kenneth Morales#:  [id number] DATE OF BIRTH:  [birth year]  DATE OF ADMISSION:  09/01/2011  PRIMARY CARE PHYSICIAN: Nonlocal  REFERRING PHYSICIAN: Dr. Daphine DeutscherMartin, ED physician.   CHIEF COMPLAINT: Chest pain for two days.   HISTORY OF PRESENT ILLNESS: 49 year old African American male with history of hypertension, hemorrhoids and remote history of cocaine abuse presented to the ED with chest pain for two days; substernal, constant tight, pressure-like, no radiation, no exacerbation factor. No sweating, nausea, vomiting. Patient has headache but no dizziness. He did not see any physician for the past two days until today, he came to ED for further evaluation. He was found to have a high troponin level at 4.47 and was treated with aspirin, nitroglycerin drip and heparin drip and admitted for non-STEMI.   PAST MEDICAL HISTORY:  1. Hypertension. 2. Remote history of cocaine abuse but denies any drug abuse recently.   PAST SURGICAL HISTORY: Hemorrhoid surgery.   SOCIAL HISTORY: Patient smokes q to 5 cigarettes a day for the past 20 years. Patient has occasional alcohol drinking but denies any drug abuse recently. He has remote history of cocaine abuse.   FAMILY HISTORY: Both grandfathers had heart attack. Father had hypertension. No CVA, diabetes history.   ALLERGIES: No.   MEDICATIONS: No.  REVIEW OF SYSTEMS: CONSTITUTIONAL: Patient denies any fever, chills but has headache. No dizziness, weight loss or weakness. HEENT: No double vision, blurred vision, epistaxis, postnasal drip. No hearing loss. No dysphagia. No slurred speech. CARDIOVASCULAR: Positive for chest pain but no orthopnea, nocturnal dyspnea or leg edema. PULMONARY: No cough, sputum, shortness of breath or hemoptysis. GASTROINTESTINAL: No nausea, vomiting, or diarrhea. No abdominal pain, melena, or bloody stool. GENITOURINARY: No dysuria, hematuria, or incontinence. ENDOCRINE: No polyuria or polydipsia. No heat or cold  intolerance. HEMATOLOGY: No easy bruising or bleeding. NEUROLOGY: No syncope, loss of consciousness or seizure. SKIN: No rash or jaundice. PSYCHIATRIC: No depression or anxiety.   LABORATORY, DIAGNOSTIC, AND RADIOLOGICAL DATA: CBC normal. Electrolytes normal. BUN 9, creatinine 0.92, troponin 4.47. CK 349, CK-MB 21.2. PTT 35.8. Chest x-ray no acute cardiopulmonary disease. EKG shows normal sinus rhythm at 66 beats per minute, possible left atrial enlargement with left ventricular hypertrophy, questionable ST elevation, possible early repolarization.   Urine screen: cocaine positive.  IMPRESSION:  1. Non-STEMI.   2. Hypertension.  3. Tobacco use.  4. Cocaine abuse.   PLAN OF TREATMENT:  1. Patient will be admitted to Critical Care Unit. Will continue telemetry monitor, closely monitor vital signs and give oxygen by nasal cannula. Continue heparin drip and nitroglycerin drip.   2. Will continue aspirin 325 mg p.o. daily and start lisinopril and Zocor. Also we will check lipid panel, TSH.  3. GI and deep vein thrombosis prophylaxis.  4. No lopressor due to cocaine positive in urine toxicology test.  Discussed the patient's situation with the patient and the patient's father and Dr. Daphine DeutscherMartin. Dr. Mariah MillingGollan, cardiologist, was already called by Dr. Daphine DeutscherMartin and Dr. Daphine DeutscherMartin discussed patient's situation with him. He will see the patient latera.   TIME SPENT: About 65 minutes.  ____________________________ Shaune PollackQing Colvin Blatt, MD qc:cms D: 09/01/2011 15:32:19 ET T: 09/01/2011 16:00:59 ET  JOB#: [id number] cc: Shaune PollackQing Manreet Kiernan, MD, <Dictator> Shaune PollackQING Icelynn Onken MD ELECTRONICALLY SIGNED 09/01/2011 21:55

## 2014-08-18 NOTE — H&P (Signed)
PATIENT NAME:  Kenneth Morales, SCICCHITANO MR#:  [id number] DATE OF BIRTH:  05/31/1965  DATE OF ADMISSION:  09/06/2011  PRIMARY CARE PHYSICIAN: None  REFERRING PHYSICIAN: Dr. Mindi Junker    CHIEF COMPLAINT: Chest pain.   HISTORY OF PRESENT ILLNESS: This is a 49 year old male who was recently discharged from [facility]. Patient was admitted 09/01/2011 for chest pain due to coronary artery disease where he was found to have non-ST elevated MI where he had a stent put in mid left circumflex artery and he was found to have thrombosis status post thrombectomy. He had a cardiac catheterization done by Dr. Kirke Corin on 09/02/2011. Patient was discharged on aspirin and lisinopril, Pravachol and Effient.   Patient did have an episode of chest pain at rest. Patient reports chest pain is midsternal, radiating to the left chest and left arm. Pain was intermittent. No relieving or provoking factors. As well it was accompanied by nausea, shortness of breath, headache and dizziness. Pain is sharp and of stabbing quality. In ED patient was not found to have any EKG changes and his troponins were elevated but actually were trending down from the ones when he was discharged. Troponins on discharge were 5.95 on 05/09 and today they were 0.82, repeat 0.92. Patient was started on IV heparin drip, given nitro paste and given 324 mg of aspirin and he did take his Effient today at home.    PAST MEDICAL HISTORY:  1. Coronary artery disease.   2. Hypertension.  3. Remote history of cocaine abuse. Denies any recent abuse.   PAST SURGICAL HISTORY: Hemorrhoid surgery.   SOCIAL HISTORY: Smokes five cigarettes a day for the past 20 years. Has occasional alcohol drinking but no abuse. Remote history of cocaine abuse; he reports last time been weeks to months.   FAMILY HISTORY: Both grandfathers had heart attack. Father had hypertension. No CVA. No diabetes.   ALLERGIES: No known drug allergies.   MEDICATIONS:  1. Aspirin 81 mg  daily. 2. Effient 10 mg oral daily.  3. Lisinopril 20 mg oral daily.  4. Pravachol 20 mg at bedtime.   REVIEW OF SYSTEMS: CONSTITUTIONAL: Denies any fever, fatigue, weakness. EYES: Denies any blurry vision, double vision, pain. ENT: Denies any tinnitus, ear pain, hearing loss, epistaxis. RESPIRATORY: Denies any cough, wheezing, hemoptysis, asthma. Has shortness of breath, mild. CARDIOVASCULAR: Has complaints of chest pain radiating to the left arm. Denies any worsening edema, palpitations. GASTROINTESTINAL: Denies any vomiting, diarrhea, abdominal pain. Has complaints of nausea. GENITOURINARY: Denies any dysuria, hematuria, renal colic. ENDO: Denies any polyuria, polydipsia, heat or cold intolerance. HEMATOLOGY: Denies any easy bruising, bleeding diathesis, history of blood clot. MUSCULOSKELETAL: Denies any shoulder pain, knee pain, arthritis, swelling or gout. NEUROLOGICAL: Denies any numbness, weakness, dysarthria, epilepsy. PSYCH: Denies any alcohol abuse. Has remote history of cocaine abuse.   PHYSICAL EXAMINATION:  VITAL SIGNS: Temperature 97.8, pulse 60, respiratory rate 20, blood pressure 159/95, saturating 98% on room air.   GENERAL: Young male, looks comfortable, not apparent distress.   HEENT: Head atraumatic, normocephalic. Pupils equal, reactive to light. Pink conjunctivae. Anicteric sclerae. Moist oral mucosa.   NECK: Supple. No thyromegaly. No JVD.   CHEST: Good air entry bilaterally. No wheezing, rales, rhonchi.   CARDIOVASCULAR: S1, S2 heard. No rubs, murmur, gallops.   ABDOMEN: Soft, nontender, nondistended. Bowel sounds present.   EXTREMITIES: No edema, no clubbing, no cyanosis.   PSYCHIATRIC: Appropriate affect. Awake, alert x3. Intact judgment and insight.   LABORATORY, DIAGNOSTIC AND RADIOLOGICAL DATA: Glucose 78,  BUN 11, creatinine 1.16, sodium 141, potassium 3.9, chloride 107, CO2 29, troponin 0.92. Urine drug negative. White blood cells 7.3, hemoglobin 3.3,  hematocrit 38.6, platelet 224, INR 0.9.   ASSESSMENT AND PLAN: This is a 49 year old male with recent history of non-ST elevated MI status post thrombectomy and bare metal stent in mid left circumflex artery presents with:  1. Chest pain. Cardiac enzymes are mildly elevated but actually it is trending down from discharge level on 05/09. This may represent unstable angina versus restenosis of stent. Patient already given 324 of aspirin. He is on Effient and he is on nitro paste. Was discharged on beta blocker, metoprolol 12.5 b.i.d. Will start on IV heparin drip. [facility] who will see the patient in a.m.  2. Hypertension. Continue with lisinopril. Will add low-dose metoprolol for his history of coronary artery disease.  3. Continue with Pravachol.  4. Tobacco abuse. Patient was counseled to stop smoking.  5. CODE STATUS: Patient is FULL CODE.   TOTAL TIME SPENT ON PATIENT CARE: 45 minutes.   ____________________________ Starleen Armsawood S. Mahrukh Seguin, MD dse:cms D: 09/06/2011 01:23:58 ET T: 09/06/2011 07:08:05 ET JOB#: [id number]  cc: Starleen Armsawood S. Brayli Klingbeil, MD, <Dictator> Ceirra Belli Teena IraniS Miko Sirico MD ELECTRONICALLY SIGNED 09/07/2011 1:18

## 2014-08-18 NOTE — Consult Note (Signed)
General Aspect 49 year old [place] American male with history of hypertension, hemorrhoids and  history of cocaine abuse presented to the ED with chest pain which happened yesterday.   He was recently hospitalized for NSTEMI in setting of Cocaine use. Cardiac caths showed a 99% stenosis in mid LCX with a large thrombus. He underwent a successful PCI and a bare metal stent placement. He was dicharged on the 9th.  Came back with substernal sharp chest pain radiating to left arm similar to his recent MI but less intense. He was hypertensive on presentation. ECG showed no new changes. TnI was slightly high but CKBM was normal. He is chest pain free now.   PAST MEDICAL HISTORY:  1. Hypertension. 2. cocaine abuse . 3.           CAD: NSTEMI in 08/2011. s/p BMS placement to mid LCX.   PAST SURGICAL HISTORY: Hemorrhoid surgery.   SOCIAL HISTORY: Patient smokes q to 5 cigarettes a day for the past 20 years. Patient has occasional alcohol drinking but denies any drug abuse recently. He was positive for cocaine during recent admission.   FAMILY HISTORY: Both grandfathers had heart attack. Father had hypertension. No CVA, diabetes history.   Physical Exam:   GEN well developed, well nourished, no acute distress, thin    HEENT red conjunctivae    NECK supple    RESP normal resp effort  clear BS    CARD Regular rate and rhythm  No murmur    ABD denies tenderness  soft    LYMPH negative neck    EXTR negative edema    SKIN normal to palpation    NEURO cranial nerves intact, motor/sensory function intact    PSYCH alert, A+O to time, place, person, good insight   Review of Systems:   Subjective/Chief Complaint cherst pain, resolved in the ER    General: No Complaints    Skin: No Complaints    ENT: No Complaints    Eyes: No Complaints    Neck: No Complaints    Respiratory: No Complaints    Cardiovascular: Chest pain or discomfort    Gastrointestinal: No Complaints     Genitourinary: No Complaints    Vascular: No Complaints    Musculoskeletal: No Complaints    Neurologic: No Complaints    Hematologic: No Complaints    Endocrine: No Complaints    Psychiatric: No Complaints    Review of Systems: All other systems were reviewed and found to be negative    Medications/Allergies Reviewed Medications/Allergies reviewed   Home Medications: Medication Instructions Status  aspirin 81 mg oral tablet 1 tab(s) orally once a day Active  lisinopril 20 mg oral tablet 1 tab(s) orally once a day Active  Pravachol 20 mg oral tablet 1 tab(s) orally once a day (at bedtime) Active  Effient 10 mg oral tablet 1 tab(s) orally once a day for 4 weeks Active   Routine Hem:  12-May-13 17:13    WBC (CBC) 7.3   RBC (CBC) 4.05   Hemoglobin (CBC) 13.3   Hematocrit (CBC) 38.6   Platelet Count (CBC) 224   MCV 95   MCH 32.8   MCHC 34.4   RDW 13.0  Routine Chem:  12-May-13 17:13    Glucose, Serum 78   BUN 11   Creatinine (comp) 1.16   Sodium, Serum 141   Potassium, Serum 3.9   Chloride, Serum 107   CO2, Serum 29   Calcium (Total), Serum 9.3  Hepatic:  12-May-13  17:13    Bilirubin, Total 0.4   Alkaline Phosphatase 92   SGPT (ALT) 25   SGOT (AST) 22   Total Protein, Serum 7.6   Albumin, Serum 3.6  Routine Chem:  12-May-13 17:13    Osmolality (calc) 280   eGFR (African American) >60   eGFR (Non-African American) >60   Anion Gap 5  Cardiac:  12-May-13 17:13    Troponin I 0.82   CK, Total 105   CPK-MB, Serum < 0.5  Routine Chem:  12-May-13 17:13    B-Type Natriuretic Peptide [facility]) 50  Urine Drugs:  80-HOZ-22 48:25    Tricyclic Antidepressant, Ur Qual (comp) NEGATIVE   Amphetamines, Urine Qual. NEGATIVE   MDMA, Urine Qual. NEGATIVE   Cocaine Metabolite, Urine Qual. NEGATIVE   Opiate, Urine qual NEGATIVE   Phencyclidine, Urine Qual. NEGATIVE   Cannabinoid, Urine Qual. NEGATIVE   Barbiturates, Urine Qual. NEGATIVE   Benzodiazepine, Urine Qual.  NEGATIVE   Methadone, Urine Qual. NEGATIVE  Cardiac:  12-May-13 21:25    Troponin I 0.92  Routine Coag:  12-May-13 23:02    Prothrombin 12.5   INR 0.9   Activated PTT (APTT) 36.3   EKG:   Interpretation EKG showing NSR with rate 66 bpm, ST and T wave ABN anterolateral leads    EKG Comparision Not changed from   Radiology Results: XRay:    12-May-13 17:51, Chest Portable Single View   Chest Portable Single View    REASON FOR EXAM:    chest pain  COMMENTS:       PROCEDURE: DXR - DXR PORTABLE CHEST SINGLE VIEW  - Sep 05 2011  5:51PM     RESULT: Comparison is made to the previous examination of 01 Sep 2011.    Right neck monitoring electrodes are present. The lungs are clear. The   heart and pulmonary vessels are normal. The bony and mediastinal   structures are unremarkable. There is no effusion. There is no   pneumothorax or evidence of congestive failure.    IMPRESSION:  No acute cardiopulmonary disease.    Dictation Site: 6    Verified By: Sundra Aland, M.D., MD    PCN: Other  Vital Signs/Nurse's Notes: **Vital Signs.:   13-May-13 08:00   Vital Signs Type Routine   Temperature Temperature (F) 97.3   Celsius 36.2   Temperature Source oral   Pulse Pulse 58   Systolic BP Systolic BP 003   Diastolic BP (mmHg) Diastolic BP (mmHg) 72   Mean BP 83   Pulse Ox % Pulse Ox % 97   Pulse Ox Activity Level  At rest   Oxygen Delivery Room Air/ 21 %     Impression 1) Chest pain No new EKG changes. TnI is trending down from recent MI. No evidence of new injury with normal CKMB.  Stent restenosis does not happen that fast and subacute stent thrombosis is usually associated with MI. No evidence for both. He is chest pain free now.  Stop Heparin. Ambulate in hallway. If chest pain, can discharge home later today on same medications but will need to add SL NTG prn. He already has a follow up appointment with me within 1 week.   2) cocaine: negative during this admision.    3) HTN: Continue Lisinopril.   Electronic Signatures: Kathlyn Sacramento (MD)  (Signed 13-May-13 08:49)  Authored: General Aspect/Present Illness, History and Physical Exam, Review of System, Home Medications, Labs, EKG , Radiology, Allergies, Vital Signs/Nurse's Notes, Impression/Plan  Last Updated: 13-May-13 08:49 by Kathlyn Sacramento (MD)

## 2014-08-25 NOTE — Consult Note (Addendum)
PATIENT NAME:  Kenneth Morales, Kenneth Morales MR#:  [id number] DATE OF BIRTH:  August 03, 1965  DATE OF CONSULTATION:  08/13/2014  REFERRING PHYSICIAN:   CONSULTING PHYSICIAN:  Audery AmelJohn T. Clapacs, MD  IDENTIFYING INFORMATION AND REASON FOR CONSULTATION: A 49 year old man with a past history of depression, comes into the Emergency Room with a chief complaint "I've been depressed."   HISTORY OF PRESENT ILLNESS: Information from the patient and the chart. The patient says that his mood has been very depressed for the last couple of months. He has not been able to work in about 7 weeks. He barely ever gets out of bed. He feels irritable and angry all the time and loses his temper over minor things. He has been sleeping poorly at night and his appetite has been poor and he has lost quite a bit of weight. He admits that he is still drinking but says that he does not drink every day and he does not think the drinking has gotten any worse since he has been depressed, although this last weekend he had a day of binging. Denies that he is abusing any other drugs. He is not currently getting any kind of outpatient mental health treatment. He has been having some hopeless thoughts and suicidal thoughts. Stopped taking all of his cardiac medicines out of hopelessness.   PAST PSYCHIATRIC HISTORY: The patient had an admission to the hospital in 2013 with severe depression, and at that time was also having some psychotic symptoms. Was treated with citalopram and Risperdal. He said he took his medicine for a little while but then discontinued it and has not been getting any follow up since then. No history of actual full-blown suicide attempts. No other medications that has been used as far as we know.   SUBSTANCE ABUSE HISTORY: History of intermittent alcohol abuse. No history of seizures or delirium tremens. Denies that he abuses any other drugs.   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 He lives with his girlfriend, but she  left him within the last week. He has been staying by himself, minimal contact with family.   PAST MEDICAL HISTORY: The patient has a history of chest pain, coronary artery disease, possible myocardial infarction, and high blood pressure.   FAMILY HISTORY: No known family history of mental illness.   CURRENT MEDICATIONS: He is supposed to be taking lisinopril 20 mg a day, metoprolol 50 mg twice a day, lovastatin 40 mg a day.   ALLERGIES: PENICILLIN.   REVIEW OF SYSTEMS: Depressed mood. Low appetite. Weight loss. Poor sleep. Fatigue and irritability. Also says that he is hearing voices waking him up at night calling his name, but does not report any other psychotic symptoms.   MENTAL STATUS EXAMINATION: Slightly disheveled man who looks his stated age, cooperative with the interview. Eye contact good. Psychomotor activity slow. Speech is quiet, decreased in amount. Affect blunted. Mood stated as depressed. Thoughts are lucid with no loosening of associations or delusions. Has hallucinations at night, but not during the day. No other psychotic symptoms. Had suicidal thoughts that are passive. No homicidal ideation. He is alert and oriented x4. Knows 3 words immediately and can repeat 2 of them at 3 minutes. Judgment and insight appear to be adequate.   LABORATORY RESULTS: We do not have a urine or a drug screen back. Alcohol negative on admission. Bilirubin elevated at 1.3, nothing else noticeable on the chemistry profile. CBC normal.   VITAL SIGNS: Blood pressure 146/103, respirations 14,  pulse 73, temperature 98.1.   ASSESSMENT: A 49 year old man with recurrent severe depression with suicidal ideation, very poor functioning, severe, weight loss and poor self-care. Needs hospitalization for stabilization and safety.   TREATMENT PLAN: Discussed plan with the patient. He will be admitted to the psychiatry ward. Restart citalopram 20 mg a day. I will hold off on antipsychotics. Restart his cardiac  medicine. Suicide precautions in place.   DIAGNOSIS, PRINCIPAL AND PRIMARY:  AXIS I: Major depression, recurrent, severe.   SECONDARY DIAGNOSES:  AXIS I:  1.  Alcohol abuse, moderate.  2.  Coronary artery disease.  3.  Hypertension.   ____________________________ Audery Amel, MD jtc:TM D: 08/13/2014 20:28:00 ET T: 08/13/2014 20:39:53 ET JOB#: [id number]  cc: Audery Amel, MD, <Dictator> Audery Amel MD ELECTRONICALLY SIGNED 08/30/2014 19:30

## 2014-08-25 NOTE — H&P (Signed)
PATIENT NAME:  Kenneth Morales, Kenneth Morales MR#:  [id number] DATE OF BIRTH:  02-19-1966  DATE OF ADMISSION:  06/04/2014   ADMITTING PHYSICIAN: Enid Baas, MD   PRIMARY CARE PHYSICIAN: Imelda Pillow, PA at [facility].   PRIMARY CARDIOLOGIST: Dr. Kirke Corin at [facility].   CHIEF COMPLAINT: Chest pain.   HISTORY OF PRESENT ILLNESS: Kenneth Morales is a 49 year old African American male with past medical history significant for history of coronary artery disease with left circumflex thrombus 99% blockage in 2013 with a thrombectomy and bare metal stent placed in at that time, hypertension, bipolar with depression and ongoing smoking, presents to the hospital from work secondary to chest pain. The patient says Kenneth Morales is having intermittent sharp chest pains in the mid sternal region going on this morning. Kenneth Morales has noticed an occasional chest pains on exertion, especially lifting heavy stuff in the past at work.  This morning the pain was more pressure-like with sharp quality at times and presented to the ER as his chest pain when Kenneth Morales had his heart attack was a similar kind of pain.  Kenneth Morales denies radiation of the pain to the arm, but both his arms ache especially him lifting heavy stuff.  No nausea, vomiting. No diaphoresis or lightheadedness.  The first set of troponin is negative.  EKG is unremarkable, but Kenneth Morales is being admitted for observation.   PAST MEDICAL HISTORY:  1.  Coronary artery disease status post left circumflex bare metal stent placement.  2.  Hypertension.  3.  Depression with bipolar disorder.  4.  Ongoing smoking.   PAST SURGICAL HISTORY: Cardiac stent placement.   ALLERGIES TO MEDICATIONS:  PENICILLIN AND LISINOPRIL, CAN CAUSE DRY COUGH.   CURRENT HOME MEDICATIONS:  1.  Aspirin 81 mg p.o. daily.  2.  Lisinopril 20 mg p.o. daily.  3.  Metoprolol 50 mg p.o. b.i.d.  4.  Atorvastatin 40 mg p.o. at bedtime.   SOCIAL HISTORY: Lives at home by himself, continues to smoke about 1/2 pack per  day. Occasional alcohol use, used cocaine in the past, but Kenneth Morales denies using anything in the last year.   FAMILY HISTORY: Hypertension in the family.   REVIEW OF SYSTEMS.  CONSTITUTIONAL: No fever, fatigue, or weakness.  EYES: No blurred vision, double vision, inflammation or glaucoma.  ENT: No tinnitus, ear pain, hearing loss, epistaxis or discharge.  RESPIRATORY: No cough, wheeze, hemoptysis, or chronic obstructive pulmonary disease.  CARDIOVASCULAR: Positive for chest pain, no orthopnea, edema,  palpitations or syncope.  GASTROINTESTINAL: No nausea, vomiting, diarrhea, abdominal pain, hematemesis, or melena.  GENITOURINARY: No dysuria, hematuria, renal calculus, frequency or incontinence.  ENDOCRINE: No polyuria, nocturia, thyroid problems, heat or cold intolerance.  HEMATOLOGY: No anemia, easy bruising or bleeding.  SKIN: No acne, rash or lesions.  MUSCULOSKELETAL: No neck, back, shoulder pain, arthritis or gout.  NEUROLOGIC: No numbness, weakness, CVA, transient ischemic attack or seizures.  PSYCHOLOGICAL: No anxiety, insomnia or depression.   PHYSICAL EXAMINATION:  VITAL SIGNS: Temperature 98.3 degrees Fahrenheit, pulse 78, respirations 18, blood pressure 145/96, pulse oximetry 99% on room air.  GENERAL: Well-built, well-nourished male, lying in bed, not in any acute distress.  HEENT: Normocephalic, atraumatic. Pupils equal, round, reacting to light. Anicteric sclerae. Extraocular movements intact. Right upper eye lid, towards the inner one-third, there is Hordeolum externum, which is nontender., non-erythematous.  OROPHARYNX: Clear without erythema, mass or exudates.  NECK: Supple. No thyromegaly, JVD or carotid bruits. No lymphadenopathy.  LUNGS: Moving air bilaterally. No wheeze or crackles. No use  of accessory muscles for breathing.  CARDIOVASCULAR: S1, S2, regular rate and rhythm. No murmurs, rubs, or gallops.  ABDOMEN: Soft, nontender, nondistended. No hepatosplenomegaly. Normal  bowel sounds.  EXTREMITIES: No pedal edema. No clubbing or cyanosis, 2+ dorsalis pedis pulses palpable bilaterally.  SKIN: No acne, rash or lesions.  LYMPHATICS: No cervical or inguinal lymphadenopathy.  NEUROLOGIC: Cranial nerves intact. No focal motor or sensory deficits.  PSYCHOLOGICAL: The patient is awake, alert, oriented x 3.   LABORATORY DATA: WBC 7.6, hemoglobin 16.1, hematocrit 47.4, platelet count 227,000.   Sodium 139, potassium 3.7, chloride 105, bicarbonate 27, BUN 11, creatinine 0.98, glucose 91, calcium of 8.9. Troponin is less than 0.02.  Chest x-ray showing clear lung fields, normal chest otherwise.  EKG showing normal sinus rhythm, heart rate of 69. EKG with normal sinus rhythm, heart rate of 69 and T wave inversions noted in V5 and V6.   ASSESSMENT AND PLAN: A 49 year old male with history of hypertension, coronary artery disease status post left circumflex, bare metal stent in 2013, depression, ongoing smoking, admitted for chest pain.  1.  Chest pain, atypical in presentation, angina versus musculoskeletal pain.  We will admit to telemetry under observation.  Recycle troponins. Get a Myoview in the a.m.  cardiology as per the patient's request, Nitro paste ordered. Continue aspirin and statin metoprolol and lisinopril.    2.  Coronary artery disease status post left circumflex thrombus in 2015, status post bare metal stent after thrombectomy in 2013, appears stable; continue cardiac medications. Kenneth Morales has history of cocaine abuse. We will check urine drug screen.  3.  Hypertension, on metoprolol, lisinopril. 4.  Tobacco use disorder.  Nicotine patch. Counseled against smoking for 3 minutes and urine drug screen ordered.  5.  Deep vein thrombosis prophylaxis with Lovenox.   CODE STATUS: FULL CODE.   TIME SPENT ON ADMISSION: 50 minutes.      ____________________________ Enid Baasadhika Rehana Uncapher, MD rk:DT D: 06/04/2014 12:42:14 ET T: 06/04/2014 13:40:53  ET JOB#: [id number]  cc: Enid Baasadhika Jadon Harbaugh, MD, <Dictator> Muhammad A. Kirke CorinArida, MD Chelsa B. Marcelle OverlieHolland, NP   Enid BaasADHIKA Vandella Ord MD ELECTRONICALLY SIGNED 07/06/2014 13:09

## 2014-08-25 NOTE — Discharge Summary (Signed)
PATIENT NAME:  Kenneth Morales, Kenneth Morales MR#:  [id number] DATE OF BIRTH:  1965-10-15  DATE OF ADMISSION:  06/04/2014 DATE OF DISCHARGE:  06/05/2014  PRESENTING COMPLAINT:  Chest pain.   DISCHARGE DIAGNOSES: 1.  Chest pain, resolved.  2.  History of coronary artery disease status post stent placement in the past in 2013.  3.  Ongoing tobacco abuse.   CODE STATUS:  Full code.   MEDICATIONS AT DISCHARGE: 1.  Metoprolol 50 mg b.i.d.  2.  Aspirin 81 mg daily.  3.  Lisinopril 20 mg daily.  4.  Lovastatin 40 mg at bedtime.   FOLLOWUP:  At [facility] in 1 to 2 weeks.   DIAGNOSTIC DATA: 1.  Cardiac enzymes x 3 were negative.  2.  Myoview stress test essentially was negative with ejection fraction of 55%. No wall motion abnormality was noted. 3.  CBC and basic metabolic panel were within normal limits.   HOSPITAL COURSE:  [name] is a 49 year old African-American gentleman with history of coronary artery disease status post stent in 2013, who came to the Emergency Room with:   1.  Chest pain. It appeared atypical in presentation. Cardiac enzymes remained negative. Blood pressure was stable. He was continued on aspirin, statins, beta blockers, and lisinopril. Cardiology did a Myoview stress test, which was negative. Ejection fraction was 55%.  2.  Coronary artery disease status post left circumflex thrombus, status post thrombectomy and bare-metal stent placement in 2013, stable.  3.  History of cocaine use. The patient's urine drug screen was negative.  4.  Tobacco abuse. Smoking cessation counseling was done.  5.  Hypertension. Metoprolol and lisinopril were given.  6.  The patient was advised to follow up at [facility] until he gets his insurance settled in from his job. He was given a 90-day prescription for his medications.   TIME SPENT:  40 minutes.    ____________________________ Wylie HailSona A. Allena KatzPatel, MD sap:nb D: 06/05/2014 14:27:33 ET T: 06/05/2014 21:59:34  ET JOB#: [id number]  cc: [name] A. Allena KatzPatel, MD, <Dictator> Willow OraSONA A Harryette Shuart MD ELECTRONICALLY SIGNED 06/25/2014 17:28

## 2014-08-25 NOTE — H&P (Signed)
PATIENT NAME:  Kenneth Morales, NEAR MR#:  [id number] DATE OF BIRTH:  19-Sep-1965  DATE OF ADMISSION:  08/14/2014  REFERRING PHYSICIAN: Emergency Room MD.   ATTENDING PHYSICIAN: Lota Leamer B. Jennet Maduro, MD.   IDENTIFYING DATA: Mr. Kenneth Morales is a 49 year old male with a history of psychotic depression.   CHIEF COMPLAINT: "I'm suicidal."   HISTORY OF PRESENT ILLNESS: Mr. Kenneth Morales was hospitalized at [facility] in 2013 for psychotic depression. He was treated with Risperdal and Celexa and did get better.  He did not have any problems with mood, anxiety, or psychosis up until 7 weeks ago. He has not been seeing a psychiatrist or taking any medications. 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 He has been trying to find a job, but finds it is difficult. He became increasingly depressed and incapacitated by depression. He complains of poor sleep, decreased appetite, anhedonia, feeling of guilt, hopelessness, worthlessness, poor energy and concentration, to the point that he stays in bed all day long, unable to do anything. He has crying spells and isolates himself. He eventually developed auditory command hallucinations telling him to kill himself and came to the hospital asking for help. He endorses some heightened anxiety, but no panic attacks. He denies symptoms suggestive of bipolar mania. He has been drinking more, but does not think that he would need alcohol detoxification. He used cocaine a few days ago. There is no prescription pill abuse.   PAST PSYCHIATRIC HISTORY: One psychiatric hospitalization before. He never attempted suicide, has never been treated for substance use.   FAMILY PSYCHIATRIC HISTORY: None reported.   PAST MEDICAL HISTORY: Dyslipidemia, hypertension,   ALLERGIES: PENICILLIN.   MEDICATIONS ON ADMISSION: Lovastatin 40 mg at bedtime, lisinopril 20 mg  daily.  SOCIAL HISTORY:  He is a Chartered certified accountant.  He owns a house, lives in the house with his girlfriend who has been supportive and trying to get him to the doctor's office sooner.  He has a daughter who has been also supportive. 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   REVIEW OF SYSTEMS:  CONSTITUTIONAL: No fevers.  No chills. No weight changes.  EYES: No double or blurred vision.  EARS, NOSE, AND THROAT: No hearing loss.  RESPIRATORY: No shortness of breath or cough.  CARDIOVASCULAR: No chest pain or orthopnea.  GASTROINTESTINAL: No abdominal pain, nausea, vomiting, or diarrhea.  GENITOURINARY: No incontinence or frequency.  ENDOCRINE: No heat or cold intolerance.  LYMPHATIC: No anemia or easy bruising.  INTEGUMENTARY: No acne or rash.  MUSCULOSKELETAL: No muscle or joint pain.  NEUROLOGIC: No tingling or weakness.  PSYCHIATRIC: See history of present illness for details.   PHYSICAL EXAMINATION:  VITAL SIGNS: Blood pressure 141/94, pulse 62, respirations 18, temperature 98.1.  GENERAL: This is a well-developed, middle-aged male in no acute distress.  HEENT: The pupils are equal, round, and reactive to light. Sclerae anicteric.  NECK: Supple. No thyromegaly.  LUNGS: Clear to auscultation. No dullness to percussion.  HEART: Regular rhythm and rate. No members, rubs, or gallops.   ABDOMEN: Soft, nontender, nondistended. Positive bowel sounds.  MUSCULOSKELETAL: Normal muscle strength in all extremities.   SKIN: No rashes or bruises.   LYMPHATIC: No cervical adenopathy.  NEUROLOGIC: Cranial nerves II through XII are intact.   LABORATORY DATA: Chemistries are within normal limits. Blood alcohol level less  than 5. LFTs within normal limits except for total bilirubin of 1.3. CBC within normal limits. Serum acetaminophen and salicylates are low. Urinalysis and urine drug screen not available at the time of dictation.   MENTAL STATUS EXAMINATION ON ADMISSION: The  patient is alert and oriented to person, place, time, and situation. He is pleasant, polite, and cooperative. He is well groomed, wearing hospital scrubs. He maintains limited eye contact. His speech is soft. Mood is depressed with flat affect. Thought process is logical, but influenced by auditory hallucinations. He is not delusional or paranoid. He endorses command hallucinations telling him to hurt himself, his cognition is grossly intact. Registration, recall, short and long-term memory are intact. He is of average intelligence and fund of knowledge. His insight and judgment are fair.   SUICIDE RISK ASSESSMENT ON ADMISSION: This is a patient with a history of psychotic depression, who became depressed a few weeks ago following job loss. He is at increased risk of violence.   INITIAL DIAGNOSES:   AXIS I:  1.  Major depressive disorder, recurrent, severe with psychotic features.  2.  Alcohol use disorder, severe.  3.  Cocaine use disorder, severe.   4.  Tobacco use disorder, severe.    AXIS III:   1.  Hypertension.  2.  Dyslipidemia.   PLAN: The patient was [facility] unit for safety, stabilization, and medication management.   1.  Suicidal ideation: The patient is able to contract for safety in the hospital.  2.  Mood. We will restart Celexa 20 mg daily and Risperdal 1 mg at bedtime for depression and psychosis.  3.  Insomnia: We will start trazodone 100 mg at bedtime,  4.  Smoking. He will receiving nicotine products. He is not interested in smoking cessation today.  5.  Hypertension. We will continue lisinopril 20 mg daily.  6.  Dyslipidemia. We will continue Mevacor and a low-fat, low-cholesterol diet.  7.  Alcohol. We will monitor for symptoms of alcohol withdrawal.  8.  Substance abuse treatment. He minimizes the problems and declines residential treatment.  9.  Disposition. He will be discharged to home with his girlfriend.  He  will likely follow up with [facility] IOP program. He will talk to Unk PintoHarvey Bryant tomorrow morning.     ____________________________ Ellin GoodieJolanta B. Jennet MaduroPucilowska, MD jbp:bu D: 08/14/2014 16:40:25 ET T: 08/14/2014 17:14:24 ET JOB#: [id number]  cc: Titania Gault B. Jennet MaduroPucilowska, MD, <Dictator> Shari ProwsJOLANTA B Khanh Cordner MD ELECTRONICALLY SIGNED 08/22/2014 10:41

## 2014-09-04 ENCOUNTER — Ambulatory Visit: Payer: Self-pay

## 2014-12-15 ENCOUNTER — Encounter: Payer: Self-pay | Admitting: Emergency Medicine

## 2014-12-15 ENCOUNTER — Emergency Department
Admission: EM | Admit: 2014-12-15 | Discharge: 2014-12-15 | Disposition: A | Discharge status: Home / Self Care | Payer: Self-pay | Attending: Emergency Medicine | Admitting: Emergency Medicine

## 2014-12-15 DIAGNOSIS — Z720 Tobacco use: Secondary | ICD-10-CM | POA: Insufficient documentation

## 2014-12-15 DIAGNOSIS — Z79899 Other long term (current) drug therapy: Secondary | ICD-10-CM | POA: Insufficient documentation

## 2014-12-15 DIAGNOSIS — M5442 Lumbago with sciatica, left side: Secondary | ICD-10-CM | POA: Insufficient documentation

## 2014-12-15 DIAGNOSIS — Z7982 Long term (current) use of aspirin: Secondary | ICD-10-CM | POA: Insufficient documentation

## 2014-12-15 DIAGNOSIS — Z7902 Long term (current) use of antithrombotics/antiplatelets: Secondary | ICD-10-CM | POA: Insufficient documentation

## 2014-12-15 DIAGNOSIS — I10 Essential (primary) hypertension: Secondary | ICD-10-CM | POA: Insufficient documentation

## 2014-12-15 LAB — URINALYSIS COMPLETE WITH MICROSCOPIC (ARMC ONLY)
Bacteria, UA: NONE SEEN
Bilirubin Urine: NEGATIVE
Glucose, UA: NEGATIVE mg/dL
Hgb urine dipstick: NEGATIVE
Ketones, ur: NEGATIVE mg/dL
Nitrite: NEGATIVE
Protein, ur: NEGATIVE mg/dL
Specific Gravity, Urine: 1.011 (ref 1.005–1.030)
pH: 6 (ref 5.0–8.0)

## 2014-12-15 MED ORDER — CYCLOBENZAPRINE HCL 10 MG PO TABS
10.0000 mg | ORAL_TABLET | Freq: Once | ORAL | Status: AC
  Administered 2014-12-15: 10 mg via ORAL
  Filled 2014-12-15: qty 1

## 2014-12-15 MED ORDER — KETOROLAC TROMETHAMINE 60 MG/2ML IM SOLN
60.0000 mg | Freq: Once | INTRAMUSCULAR | Status: AC
  Administered 2014-12-15: 60 mg via INTRAMUSCULAR
  Filled 2014-12-15: qty 2

## 2014-12-15 MED ORDER — PREDNISONE 10 MG PO TABS: ORAL_TABLET | ORAL | Status: DC

## 2014-12-15 NOTE — ED Notes (Signed)
Patient to ED with c/o left lower back pain radiating down left leg for the last 4-5 days, reports pain is worse today than it has been.

## 2014-12-15 NOTE — ED Provider Notes (Signed)
[facility] Emergency Department Provider Note  ____________________________________________  Time seen: Approximately 9:44 AM  I have reviewed the triage vital signs and the nursing notes.   HISTORY  Chief Complaint Back Pain   HPI Kenneth Morales is a 49 y.o. male is here complaining of constant left lower back pain radiating into his left leg for the last 4 or 5 days. He has had problems with his back but has never seen an orthopedist for it. He denies any recent injury. He states he has been taking aspirin and ibuprofen at home without any relief. He also believes that his "kidneys"however he denies any urinary symptoms. He denies any previous kidney stones. He denies any bowel or bladder incontinence. Currently he rates his pain as 10 out of 10.Patient is ambulatory in the ED.   Past Medical History  Diagnosis Date  . Acute MI MAY 2013  . CAD (coronary artery disease)     NSTEMI in 05/13 in setting of Cocaine use. Cath: 99% mid LCX stenosis with a large thrombus. PCI and BMS (4.0 X15 mm Vision) placement to mid LCX, LAD: 20%, RCA: 30%, EF: 60%.   . Hyperlipidemia   . Hypertension   . Tobacco abuse   . Cocaine abuse     quit in 08/2011  . Spider bite   . Depression     Patient Active Problem List   Diagnosis Date Noted  . SOB (shortness of breath) 12/10/2011  . CAD (coronary artery disease)   . Hyperlipidemia   . Hypertension   . Tobacco abuse   . Cocaine abuse     Past Surgical History  Procedure Laterality Date  . Hemorrhoid surgery    . Cardiac catheterization  MAY 2013    s/p stent @ [facility]    Current Outpatient Rx  Name  Route  Sig  Dispense  Refill  . aspirin 81 MG tablet   Oral   Take 81 mg by mouth daily.         . clopidogrel (PLAVIX) 75 MG tablet   Oral   Take 75 mg by mouth daily.         Marland Kitchen lisinopril (PRINIVIL,ZESTRIL) 40 MG tablet   Oral   Take 40 mg by mouth daily.         . metoprolol (LOPRESSOR) 50 MG  tablet   Oral   Take 50 mg by mouth 2 (two) times daily.         . pravastatin (PRAVACHOL) 20 MG tablet   Oral   Take 20 mg by mouth daily.         . predniSONE (DELTASONE) 10 MG tablet      Take 6 tablets  today, on day 2 take 5 tablets, day 3 take 4 tablets, day 4 take 3 tablets, day 5 take  2 tablets and 1 tablet the last day   21 tablet   0     Allergies Penicillins  Family History  Problem Relation Age of Onset  . Heart attack Maternal Grandfather   . Heart attack Paternal Grandfather     Social History Social History  Substance Use Topics  . Smoking status: Current Every Day Smoker -- 0.25 packs/day for 30 years    Types: Cigarettes  . Smokeless tobacco: None  . Alcohol Use: 0.0 oz/week    1-2 Cans of beer per week    Review of Systems Constitutional: No fever/chills ENT: No sore throat. Cardiovascular: Denies chest pain. Respiratory:  Denies shortness of breath. Gastrointestinal: No abdominal pain.  No nausea, no vomiting.  Genitourinary: Negative for dysuria. Musculoskeletal: Negative for back pain. Skin: Negative for rash. Neurological: Negative for headaches, focal weakness or numbness.  10-point ROS otherwise negative.  ____________________________________________   PHYSICAL EXAM:  VITAL SIGNS: ED Triage Vitals  Enc Vitals Group     BP 12/15/14 0933 150/98 mmHg     Pulse Rate 12/15/14 0933 66     Resp 12/15/14 0933 20     Temp 12/15/14 0933 98.3 F (36.8 C)     Temp Source 12/15/14 0933 Oral     SpO2 12/15/14 0933 99 %     Weight 12/15/14 0933 165 lb (74.844 kg)     Height 12/15/14 0933  (1.778 m)     Head Cir --      Peak Flow --      Pain Score 12/15/14 0930 10     Pain Loc --      Pain Edu? --      Excl. in GC? --     Constitutional: Alert and oriented. Well appearing and in no acute distress. Eyes: Conjunctivae are normal. PERRL. EOMI. Head: Atraumatic. Nose: No congestion/rhinnorhea. Neck: No stridor.    Cardiovascular: Normal rate, regular rhythm. Grossly normal heart sounds.  Good peripheral circulation. Respiratory: Normal respiratory effort.  No retractions. Lungs CTAB. Gastrointestinal: Soft and nontender. No distention.  No CVA tenderness. Musculoskeletal: Back exam no gross deformity was noted. There is some tenderness on the left lumbar paravertebral muscles. No active muscle spasms were seen but patient is restricted in range of motion secondary to pain. Straight leg raises were approximate 75 bilaterally with reproduction of his pain in his left lower SI  area. No lower extremity tenderness nor edema.  No joint effusions. Reflexes were 1+ bilaterally Neurologic:  Normal speech and language. No gross focal neurologic deficits are appreciated. No gait instability. Skin:  Skin is warm, dry and intact. No rash noted. Psychiatric: Mood and affect are normal. Speech and behavior are normal.  ____________________________________________   LABS (all labs ordered are listed, but only abnormal results are displayed)  Labs Reviewed  URINALYSIS COMPLETEWITH MICROSCOPIC ([facility] ONLY) - Abnormal; Notable for the following:    Color, Urine STRAW (*)    APPearance CLEAR (*)    Leukocytes, UA TRACE (*)    Squamous Epithelial / LPF 0-5 (*)    All other components within normal limits    RADIOLOGY  Deferred ____________________________________________   PROCEDURES  Procedure(s) performed: None  Critical Care performed: No  ____________________________________________   INITIAL IMPRESSION / ASSESSMENT AND PLAN / ED COURSE  Pertinent labs & imaging results that were available during my care of the patient were reviewed by me and considered in my medical decision making (see chart for details).  Urinalysis was completely normal and patient was told that this was muscle skeletal pain rather than "kidney pain". While waiting in the emergency room he was given an injection of Toradol 60  mg IM and Flexeril 10 mg by mouth. Patient states that his back pain is much better. He was discharged with a prescription of prednisone 60 mg 6 day taper. ____________________________________________   FINAL CLINICAL IMPRESSION(S) / ED DIAGNOSES  Final diagnoses:  Left-sided low back pain with left-sided sciatica      Tommi Rumps, PA-C 12/15/14 1404  Minna Antis, MD 12/15/14 1525

## 2014-12-16 ENCOUNTER — Emergency Department
Admission: EM | Admit: 2014-12-16 | Discharge: 2014-12-16 | Disposition: A | Discharge status: Home / Self Care | Payer: Self-pay | Attending: Emergency Medicine | Admitting: Emergency Medicine

## 2014-12-16 ENCOUNTER — Encounter: Payer: Self-pay | Admitting: *Deleted

## 2014-12-16 DIAGNOSIS — M545 Low back pain, unspecified: Secondary | ICD-10-CM

## 2014-12-16 DIAGNOSIS — Z880 Allergy status to penicillin: Secondary | ICD-10-CM | POA: Insufficient documentation

## 2014-12-16 DIAGNOSIS — I10 Essential (primary) hypertension: Secondary | ICD-10-CM | POA: Insufficient documentation

## 2014-12-16 DIAGNOSIS — Z7902 Long term (current) use of antithrombotics/antiplatelets: Secondary | ICD-10-CM | POA: Insufficient documentation

## 2014-12-16 DIAGNOSIS — Z7982 Long term (current) use of aspirin: Secondary | ICD-10-CM | POA: Insufficient documentation

## 2014-12-16 DIAGNOSIS — Z79899 Other long term (current) drug therapy: Secondary | ICD-10-CM | POA: Insufficient documentation

## 2014-12-16 DIAGNOSIS — Z720 Tobacco use: Secondary | ICD-10-CM | POA: Insufficient documentation

## 2014-12-16 MED ORDER — KETOROLAC TROMETHAMINE 30 MG/ML IJ SOLN
30.0000 mg | Freq: Once | INTRAMUSCULAR | Status: AC
  Administered 2014-12-16: 30 mg via INTRAMUSCULAR
  Filled 2014-12-16: qty 1

## 2014-12-16 MED ORDER — DIAZEPAM 5 MG PO TABS: 5.0000 mg | ORAL_TABLET | Freq: Three times a day (TID) | ORAL | Status: DC | PRN

## 2014-12-16 MED ORDER — NAPROXEN 500 MG PO TABS: 500.0000 mg | ORAL_TABLET | Freq: Two times a day (BID) | ORAL | Status: DC

## 2014-12-16 MED ORDER — DIAZEPAM 5 MG PO TABS
5.0000 mg | ORAL_TABLET | Freq: Once | ORAL | Status: AC
  Administered 2014-12-16: 5 mg via ORAL
  Filled 2014-12-16: qty 1

## 2014-12-16 NOTE — ED Notes (Signed)
Pt seen yesterday for back pain, pt reports increased back pain today

## 2014-12-16 NOTE — ED Provider Notes (Signed)
[facility] Emergency Department Provider Note  ____________________________________________  Time seen: On arrival  I have reviewed the triage vital signs and the nursing notes.   HISTORY  Chief Complaint Back Pain    HPI Kenneth Morales is a 49 y.o. male who presents with back pain. He was seen yesterday for similar complaints but has not gotten his prescriptions filled. He complains of left-sided paraspinal lower pain. He denies neuro deficits. He denies fevers chills. He denies abdominal pain. No numbness or tingling    Past Medical History  Diagnosis Date  . Acute MI MAY 2013  . CAD (coronary artery disease)     NSTEMI in 05/13 in setting of Cocaine use. Cath: 99% mid LCX stenosis with a large thrombus. PCI and BMS (4.0 X15 mm Vision) placement to mid LCX, LAD: 20%, RCA: 30%, EF: 60%.   . Hyperlipidemia   . Hypertension   . Tobacco abuse   . Cocaine abuse     quit in 08/2011  . Spider bite   . Depression     Patient Active Problem List   Diagnosis Date Noted  . SOB (shortness of breath) 12/10/2011  . CAD (coronary artery disease)   . Hyperlipidemia   . Hypertension   . Tobacco abuse   . Cocaine abuse     Past Surgical History  Procedure Laterality Date  . Hemorrhoid surgery    . Cardiac catheterization  MAY 2013    s/p stent @ [facility]    Current Outpatient Rx  Name  Route  Sig  Dispense  Refill  . aspirin 81 MG tablet   Oral   Take 81 mg by mouth daily.         . clopidogrel (PLAVIX) 75 MG tablet   Oral   Take 75 mg by mouth daily.         . diazepam (VALIUM) 5 MG tablet   Oral   Take 1 tablet (5 mg total) by mouth every 8 (eight) hours as needed for anxiety.   20 tablet   0   . lisinopril (PRINIVIL,ZESTRIL) 40 MG tablet   Oral   Take 40 mg by mouth daily.         . metoprolol (LOPRESSOR) 50 MG tablet   Oral   Take 50 mg by mouth 2 (two) times daily.         . naproxen (NAPROSYN) 500 MG tablet   Oral   Take  1 tablet (500 mg total) by mouth 2 (two) times daily with a meal.   20 tablet   2   . pravastatin (PRAVACHOL) 20 MG tablet   Oral   Take 20 mg by mouth daily.         . predniSONE (DELTASONE) 10 MG tablet      Take 6 tablets  today, on day 2 take 5 tablets, day 3 take 4 tablets, day 4 take 3 tablets, day 5 take  2 tablets and 1 tablet the last day   21 tablet   0     Allergies Penicillins  Family History  Problem Relation Age of Onset  . Heart attack Maternal Grandfather   . Heart attack Paternal Grandfather     Social History Social History  Substance Use Topics  . Smoking status: Current Every Day Smoker -- 0.25 packs/day for 30 years    Types: Cigarettes  . Smokeless tobacco: None  . Alcohol Use: 0.0 oz/week    1-2 Cans of beer  per week    Review of Systems  Constitutional: Negative for fever.  Genitourinary: Negative for dysuria or incontinence Musculoskeletal: Negative for neck pain Skin: Negative for rash. Neurological: Negative for headaches or focal weakness   ____________________________________________   PHYSICAL EXAM:  VITAL SIGNS: ED Triage Vitals  Enc Vitals Group     BP 12/16/14 1719 147/100 mmHg     Pulse Rate 12/16/14 1719 77     Resp --      Temp 12/16/14 1719 98.5 F (36.9 C)     Temp Source 12/16/14 1719 Oral     SpO2 12/16/14 1719 98 %     Weight 12/16/14 1719 165 lb (74.844 kg)     Height 12/16/14 1719 5\' 10"  (1.778 m)     Head Cir --      Peak Flow --      Pain Score 12/16/14 1720 10     Pain Loc --      Pain Edu? --      Excl. in GC? --      Constitutional: Alert and oriented. Well appearing and in no distress. Eyes: Conjunctivae are normal.  ENT   Head: Normocephalic and atraumatic.   Mouth/Throat: Mucous membranes are moist. Cardiovascular: Normal rate, regular rhythm.  Respiratory: Normal respiratory effort without tachypnea nor retractions.  Gastrointestinal: Soft and non-tender in all quadrants. No  distention. There is no CVA tenderness. Musculoskeletal: Nontender with normal range of motion in all extremities. Patient with left lumbar paraspinal muscle tenderness to palpation. No vertebral tenderness to palpation normal lower extremity strength Neurologic:  Normal speech and language. No gross focal neurologic deficits are appreciated. Skin:  Skin is warm, dry and intact. No rash noted. Psychiatric: Mood and affect are normal. Patient exhibits appropriate insight and judgment.  ____________________________________________    LABS (pertinent positives/negatives)  Labs Reviewed - No data to display  ____________________________________________     ____________________________________________    RADIOLOGY I have personally reviewed any xrays that were ordered on this patient: None  ____________________________________________   PROCEDURES  Procedure(s) performed: none   ____________________________________________   INITIAL IMPRESSION / ASSESSMENT AND PLAN / ED COURSE  Pertinent labs & imaging results that were available during my care of the patient were reviewed by me and considered in my medical decision making (see chart for details).  Toradol 30 mg IM given Valium 5 mg by mouth given and recommend supportive care for muscular skeletal back pain  ____________________________________________   FINAL CLINICAL IMPRESSION(S) / ED DIAGNOSES  Final diagnoses:  Left-sided low back pain without sciatica     Jene Every, MD 12/16/14 [phone]

## 2014-12-16 NOTE — ED Notes (Signed)
States he was seen yesterday for back pain  conts to have pain

## 2014-12-16 NOTE — Discharge Instructions (Signed)
Back Pain, Adult Low back pain is very common. About 1 in 5 people have back pain.The cause of low back pain is rarely dangerous. The pain often gets better over time.About half of people with a sudden onset of back pain feel better in just 2 weeks. About 8 in 10 people feel better by 6 weeks.  CAUSES Some common causes of back pain include:  Strain of the muscles or ligaments supporting the spine.  Wear and tear (degeneration) of the spinal discs.  Arthritis.  Direct injury to the back. DIAGNOSIS Most of the time, the direct cause of low back pain is not known.However, back pain can be treated effectively even when the exact cause of the pain is unknown.Answering your caregiver's questions about your overall health and symptoms is one of the most accurate ways to make sure the cause of your pain is not dangerous. If your caregiver needs more information, he or she may order lab work or imaging tests (X-rays or MRIs).However, even if imaging tests show changes in your back, this usually does not require surgery. HOME CARE INSTRUCTIONS For many people, back pain returns.Since low back pain is rarely dangerous, it is often a condition that people can learn to manageon their own.   Remain active. It is stressful on the back to sit or stand in one place. Do not sit, drive, or stand in one place for more than 30 minutes at a time. Take short walks on level surfaces as soon as pain allows.Try to increase the length of time you walk each day.  Do not stay in bed.Resting more than 1 or 2 days can delay your recovery.  Do not avoid exercise or work.Your body is made to move.It is not dangerous to be active, even though your back may hurt.Your back will likely heal faster if you return to being active before your pain is gone.  Pay attention to your body when you bend and lift. Many people have less discomfortwhen lifting if they bend their knees, keep the load close to their bodies,and  avoid twisting. Often, the most comfortable positions are those that put less stress on your recovering back.  Find a comfortable position to sleep. Use a firm mattress and lie on your side with your knees slightly bent. If you lie on your back, put a pillow under your knees.  Only take over-the-counter or prescription medicines as directed by your caregiver. Over-the-counter medicines to reduce pain and inflammation are often the most helpful.Your caregiver may prescribe muscle relaxant drugs.These medicines help dull your pain so you can more quickly return to your normal activities and healthy exercise.  Put ice on the injured area.  Put ice in a plastic bag.  Place a towel between your skin and the bag.  Leave the ice on for 15-20 minutes, 03-04 times a day for the first 2 to 3 days. After that, ice and heat may be alternated to reduce pain and spasms.  Ask your caregiver about trying back exercises and gentle massage. This may be of some benefit.  Avoid feeling anxious or stressed.Stress increases muscle tension and can worsen back pain.It is important to recognize when you are anxious or stressed and learn ways to manage it.Exercise is a great option. SEEK MEDICAL CARE IF:  You have pain that is not relieved with rest or medicine.  You have pain that does not improve in 1 week.  You have new symptoms.  You are generally not feeling well. SEEK   IMMEDIATE MEDICAL CARE IF:   You have pain that radiates from your back into your legs.  You develop new bowel or bladder control problems.  You have unusual weakness or numbness in your arms or legs.  You develop nausea or vomiting.  You develop abdominal pain.  You feel faint. Document Released: 04/12/2005 Document Revised: 10/12/2011 Document Reviewed: 08/14/2013 Exit[employer] Patient Information 2015 [employer]. This information is not intended to replace advice given to you by your health care provider. Make sure you  discuss any questions you have with your health care provider.  

## 2015-01-14 ENCOUNTER — Encounter: Payer: Self-pay | Admitting: Emergency Medicine

## 2015-01-14 ENCOUNTER — Emergency Department
Admission: EM | Admit: 2015-01-14 | Discharge: 2015-01-14 | Disposition: A | Discharge status: Home / Self Care | Payer: Self-pay | Attending: Emergency Medicine | Admitting: Emergency Medicine

## 2015-01-14 DIAGNOSIS — Z7902 Long term (current) use of antithrombotics/antiplatelets: Secondary | ICD-10-CM | POA: Insufficient documentation

## 2015-01-14 DIAGNOSIS — I10 Essential (primary) hypertension: Secondary | ICD-10-CM | POA: Insufficient documentation

## 2015-01-14 DIAGNOSIS — K644 Residual hemorrhoidal skin tags: Secondary | ICD-10-CM

## 2015-01-14 DIAGNOSIS — Z880 Allergy status to penicillin: Secondary | ICD-10-CM | POA: Insufficient documentation

## 2015-01-14 DIAGNOSIS — Z7982 Long term (current) use of aspirin: Secondary | ICD-10-CM | POA: Insufficient documentation

## 2015-01-14 DIAGNOSIS — Z720 Tobacco use: Secondary | ICD-10-CM | POA: Insufficient documentation

## 2015-01-14 DIAGNOSIS — Z791 Long term (current) use of non-steroidal anti-inflammatories (NSAID): Secondary | ICD-10-CM | POA: Insufficient documentation

## 2015-01-14 DIAGNOSIS — K645 Perianal venous thrombosis: Secondary | ICD-10-CM | POA: Insufficient documentation

## 2015-01-14 MED ORDER — PRAMOXINE HCL 1 % RE FOAM: 1.0000 "application " | Freq: Three times a day (TID) | RECTAL | Status: DC | PRN

## 2015-01-14 MED ORDER — TUCKS 50 % EX PADS: 1.0000 "application " | MEDICATED_PAD | Freq: Three times a day (TID) | CUTANEOUS | Status: DC | PRN

## 2015-01-14 MED ORDER — DOCUSATE SODIUM 100 MG PO CAPS: 200.0000 mg | ORAL_CAPSULE | Freq: Two times a day (BID) | ORAL | Status: DC

## 2015-01-14 NOTE — ED Provider Notes (Signed)
[facility] Emergency Department Provider Note  ____________________________________________  Time seen: 9:50 PM  I have reviewed the triage vital signs and the nursing notes.   HISTORY  Chief Complaint Hemorrhoids    HPI Kenneth Morales is a 49 y.o. male who complains of hemorrhoids that became engorged earlier today and been painful all day. He reports a history of hemorrhoids that required resection by [facility] surgery. He's not been taking any stool softeners since then. Denies any chest pain shortness of breath or abdominal pain. No blood in the stool or melena. No nausea vomiting.     Past Medical History  Diagnosis Date  . Acute MI MAY 2013  . CAD (coronary artery disease)     NSTEMI in 05/13 in setting of Cocaine use. Cath: 99% mid LCX stenosis with a large thrombus. PCI and BMS (4.0 X15 mm Vision) placement to mid LCX, LAD: 20%, RCA: 30%, EF: 60%.   . Hyperlipidemia   . Hypertension   . Tobacco abuse   . Cocaine abuse     quit in 08/2011  . Spider bite   . Depression      Patient Active Problem List   Diagnosis Date Noted  . SOB (shortness of breath) 12/10/2011  . CAD (coronary artery disease)   . Hyperlipidemia   . Hypertension   . Tobacco abuse   . Cocaine abuse      Past Surgical History  Procedure Laterality Date  . Hemorrhoid surgery    . Cardiac catheterization  MAY 2013    s/p stent @ [facility]  . Cardiac catheterization       Current Outpatient Rx  Name  Route  Sig  Dispense  Refill  . aspirin 81 MG tablet   Oral   Take 81 mg by mouth daily.         . clopidogrel (PLAVIX) 75 MG tablet   Oral   Take 75 mg by mouth daily.         . diazepam (VALIUM) 5 MG tablet   Oral   Take 1 tablet (5 mg total) by mouth every 8 (eight) hours as needed for anxiety.   20 tablet   0   . docusate sodium (COLACE) 100 MG capsule   Oral   Take 2 capsules (200 mg total) by mouth 2 (two) times daily.   120 capsule   3   .  lisinopril (PRINIVIL,ZESTRIL) 40 MG tablet   Oral   Take 40 mg by mouth daily.         . metoprolol (LOPRESSOR) 50 MG tablet   Oral   Take 50 mg by mouth 2 (two) times daily.         . naproxen (NAPROSYN) 500 MG tablet   Oral   Take 1 tablet (500 mg total) by mouth 2 (two) times daily with a meal.   20 tablet   2   . pramoxine (PROCTOFOAM) 1 % foam   Rectal   Place 1 application rectally 3 (three) times daily as needed for itching.   15 g   0   . pravastatin (PRAVACHOL) 20 MG tablet   Oral   Take 20 mg by mouth daily.         . predniSONE (DELTASONE) 10 MG tablet      Take 6 tablets  today, on day 2 take 5 tablets, day 3 take 4 tablets, day 4 take 3 tablets, day 5 take  2 tablets and 1  tablet the last day   21 tablet   0   . Witch Hazel (TUCKS) 50 % PADS   Apply externally   Apply 1 application topically 3 (three) times daily as needed.   40 each   3      Allergies Penicillins   Family History  Problem Relation Age of Onset  . Heart attack Maternal Grandfather   . Heart attack Paternal Grandfather     Social History Social History  Substance Use Topics  . Smoking status: Current Every Day Smoker -- 0.50 packs/day for 30 years    Types: Cigarettes  . Smokeless tobacco: None  . Alcohol Use: 0.0 oz/week    1-2 Cans of beer per week    Review of Systems  Constitutional:   No fever or chills. No weight changes Eyes:   No blurry vision or double vision.  ENT:   No sore throat. Cardiovascular:   No chest pain. Respiratory:   No dyspnea or cough. Gastrointestinal:   Negative for abdominal pain, vomiting and diarrhea.  No BRBPR or melena. Hemorrhoids as above Genitourinary:   Negative for dysuria, urinary retention, bloody urine, or difficulty urinating. Musculoskeletal:   Negative for back pain. No joint swelling or pain. Skin:   Negative for rash. Neurological:   Negative for headaches, focal weakness or numbness. Psychiatric:  No anxiety or  depression.   Endocrine:  No hot/cold intolerance, changes in energy, or sleep difficulty.  10-point ROS otherwise negative.  ____________________________________________   PHYSICAL EXAM:  VITAL SIGNS: ED Triage Vitals  Enc Vitals Group     BP 01/14/15 1944 154/113 mmHg     Pulse Rate 01/14/15 1944 84     Resp 01/14/15 1944 18     Temp 01/14/15 1944 98.3 F (36.8 C)     Temp Source 01/14/15 1944 Oral     SpO2 01/14/15 1944 95 %     Weight 01/14/15 1944 165 lb (74.844 kg)     Height 01/14/15 1944  (1.778 m)     Head Cir --      Peak Flow --      Pain Score 01/14/15 1946 10     Pain Loc --      Pain Edu? --      Excl. in GC? --      Constitutional:   Alert and oriented. Well appearing and in no distress. Eyes:   No scleral icterus. No conjunctival pallor. PERRL. EOMI ENT   Gastrointestinal:   Soft and nontender. No distention. There is no CVA tenderness.  No rebound, rigidity, or guarding. Rectal exam reveals a large prolapsed external hemorrhoid that is soft but mildly tender to the touch. This is reducible with decompression of the hemorrhoid. Rectal exam is unremarkable other than the external hemorrhoid. Brown stool on exam Genitourinary:   deferred Musculoskeletal:   Nontender with normal range of motion in all extremities. No joint effusions.  No lower extremity tenderness.  No edema. Neurologic:   Normal speech and language.  CN 2-10 normal. Motor grossly intact.  Normal gait. No gross focal neurologic deficits are appreciated.  Skin:    Skin is warm, dry and intact. No rash noted.  No petechiae, purpura, or bullae. Psychiatric:   Mood and affect are normal. Speech and behavior are normal. Patient exhibits appropriate insight and judgment.  ____________________________________________    LABS (pertinent positives/negatives) (all labs ordered are listed, but only abnormal results are displayed) Labs Reviewed - No data to  display ____________________________________________   EKG    ____________________________________________    RADIOLOGY    ____________________________________________   PROCEDURES   ____________________________________________   INITIAL IMPRESSION / ASSESSMENT AND PLAN / ED COURSE  Pertinent labs & imaging results that were available during my care of the patient were reviewed by me and considered in my medical decision making (see chart for details).  Patient presents with prolapsed external hemorrhoid. Not thrombosed. No bleeding. I reduced the hemorrhoid at the bedside with some symptomatic improvement. We'll discharge the patient with Tucks witch hazel, Proctofoam, sitz bath instructions, and daily Colace and have him follow-up with his [facility].     ____________________________________________   FINAL CLINICAL IMPRESSION(S) / ED DIAGNOSES  Final diagnoses:  Prolapsed external hemorrhoids      Sharman Cheek, MD 01/14/15 2218

## 2015-01-14 NOTE — Discharge Instructions (Signed)

## 2015-01-14 NOTE — ED Notes (Signed)
Pt presents to ED with c/o hemorrhoids. Pt states he had them lanced in 2012; reports he noticed they came back this morning while having a bowel movement. Previous surgery was preformed at [facility]. Denies bleeding.

## 2015-06-24 ENCOUNTER — Emergency Department
Admission: EM | Admit: 2015-06-24 | Discharge: 2015-06-24 | Disposition: A | Discharge status: Home / Self Care | Payer: Self-pay | Attending: Emergency Medicine | Admitting: Emergency Medicine

## 2015-06-24 DIAGNOSIS — I10 Essential (primary) hypertension: Secondary | ICD-10-CM | POA: Insufficient documentation

## 2015-06-24 DIAGNOSIS — Z880 Allergy status to penicillin: Secondary | ICD-10-CM | POA: Insufficient documentation

## 2015-06-24 DIAGNOSIS — X58XXXD Exposure to other specified factors, subsequent encounter: Secondary | ICD-10-CM | POA: Insufficient documentation

## 2015-06-24 DIAGNOSIS — Z7982 Long term (current) use of aspirin: Secondary | ICD-10-CM | POA: Insufficient documentation

## 2015-06-24 DIAGNOSIS — S39012D Strain of muscle, fascia and tendon of lower back, subsequent encounter: Secondary | ICD-10-CM | POA: Insufficient documentation

## 2015-06-24 DIAGNOSIS — Z79899 Other long term (current) drug therapy: Secondary | ICD-10-CM | POA: Insufficient documentation

## 2015-06-24 DIAGNOSIS — Z791 Long term (current) use of non-steroidal anti-inflammatories (NSAID): Secondary | ICD-10-CM | POA: Insufficient documentation

## 2015-06-24 DIAGNOSIS — F17210 Nicotine dependence, cigarettes, uncomplicated: Secondary | ICD-10-CM | POA: Insufficient documentation

## 2015-06-24 DIAGNOSIS — Z7901 Long term (current) use of anticoagulants: Secondary | ICD-10-CM | POA: Insufficient documentation

## 2015-06-24 MED ORDER — NAPROXEN 500 MG PO TABS: 500.0000 mg | ORAL_TABLET | Freq: Two times a day (BID) | ORAL | Status: DC

## 2015-06-24 MED ORDER — KETOROLAC TROMETHAMINE 60 MG/2ML IM SOLN
60.0000 mg | Freq: Once | INTRAMUSCULAR | Status: AC
  Administered 2015-06-24: 60 mg via INTRAMUSCULAR
  Filled 2015-06-24: qty 2

## 2015-06-24 MED ORDER — DIAZEPAM 5 MG PO TABS: 5.0000 mg | ORAL_TABLET | Freq: Three times a day (TID) | ORAL | Status: DC | PRN

## 2015-06-24 NOTE — ED Notes (Signed)
TRIAGE ON PAPER

## 2015-06-24 NOTE — ED Notes (Signed)
PT HERE WITH REPORTS OF BACK PAIN  "i WAS PLAYING WITH MY DAUGHTER AND MY BACK JUST CAUGHT AND THAT HAPPENED Sunday  I HAVE BEEN HURTING SINCE THEN - I HAVE COME HERE BEFORE FOR THE SAME PAIN AND YA'LL NORMALLY GIVE ME A SHOT."

## 2015-06-24 NOTE — ED Provider Notes (Signed)
[facility] Emergency Department Provider Note  ____________________________________________  Time seen: Approximately 1:40 PM  I have reviewed the triage vital signs and the nursing notes.   HISTORY  Chief Complaint No chief complaint on file.    HPI Kenneth Morales is a 50 y.o. male patient complaining low back pain for 2 days status post playing with his daughter. He stated he moved around and felt a catch in his back and his pain ever since that incident. Patient stated pain not relieved with over-the-counter anti-inflammatory medications. He denies any radicular component to this pain. Patient denies any bladder or bowel dysfunction. No positive measures taken for this complaint. Patient rated his pain as a 10 over 10. Patient described pain as sharp.   Past Medical History  Diagnosis Date  . Acute MI MAY 2013  . CAD (coronary artery disease)     NSTEMI in 05/13 in setting of Cocaine use. Cath: 99% mid LCX stenosis with a large thrombus. PCI and BMS (4.0 X15 mm Vision) placement to mid LCX, LAD: 20%, RCA: 30%, EF: 60%.   . Hyperlipidemia   . Hypertension   . Tobacco abuse   . Cocaine abuse     quit in 08/2011  . Spider bite   . Depression     Patient Active Problem List   Diagnosis Date Noted  . SOB (shortness of breath) 12/10/2011  . CAD (coronary artery disease)   . Hyperlipidemia   . Hypertension   . Tobacco abuse   . Cocaine abuse     Past Surgical History  Procedure Laterality Date  . Hemorrhoid surgery    . Cardiac catheterization  MAY 2013    s/p stent @ [facility]  . Cardiac catheterization      Current Outpatient Rx  Name  Route  Sig  Dispense  Refill  . aspirin 81 MG tablet   Oral   Take 81 mg by mouth daily.         . clopidogrel (PLAVIX) 75 MG tablet   Oral   Take 75 mg by mouth daily.         . diazepam (VALIUM) 5 MG tablet   Oral   Take 1 tablet (5 mg total) by mouth every 8 (eight) hours as needed for anxiety.    20 tablet   0   . diazepam (VALIUM) 5 MG tablet   Oral   Take 1 tablet (5 mg total) by mouth every 8 (eight) hours as needed for anxiety.   15 tablet   0   . docusate sodium (COLACE) 100 MG capsule   Oral   Take 2 capsules (200 mg total) by mouth 2 (two) times daily.   120 capsule   3   . lisinopril (PRINIVIL,ZESTRIL) 40 MG tablet   Oral   Take 40 mg by mouth daily.         . metoprolol (LOPRESSOR) 50 MG tablet   Oral   Take 50 mg by mouth 2 (two) times daily.         . naproxen (NAPROSYN) 500 MG tablet   Oral   Take 1 tablet (500 mg total) by mouth 2 (two) times daily with a meal.   20 tablet   2   . naproxen (NAPROSYN) 500 MG tablet   Oral   Take 1 tablet (500 mg total) by mouth 2 (two) times daily with a meal.   20 tablet   0   . pramoxine (PROCTOFOAM) 1 %  foam   Rectal   Place 1 application rectally 3 (three) times daily as needed for itching.   15 g   0   . pravastatin (PRAVACHOL) 20 MG tablet   Oral   Take 20 mg by mouth daily.         . predniSONE (DELTASONE) 10 MG tablet      Take 6 tablets  today, on day 2 take 5 tablets, day 3 take 4 tablets, day 4 take 3 tablets, day 5 take  2 tablets and 1 tablet the last day   21 tablet   0   . Witch Hazel (TUCKS) 50 % PADS   Apply externally   Apply 1 application topically 3 (three) times daily as needed.   40 each   3     Allergies Penicillins  Family History  Problem Relation Age of Onset  . Heart attack Maternal Grandfather   . Heart attack Paternal Grandfather     Social History Social History  Substance Use Topics  . Smoking status: Current Every Day Smoker -- 0.50 packs/day for 30 years    Types: Cigarettes  . Smokeless tobacco: Not on file  . Alcohol Use: 0.0 oz/week    1-2 Cans of beer per week    Review of Systems Constitutional: No fever/chills Eyes: No visual changes. ENT: No sore throat. Cardiovascular: Denies chest pain. Respiratory: Denies shortness of  breath. Gastrointestinal: No abdominal pain.  No nausea, no vomiting.  No diarrhea.  No constipation. Genitourinary: Negative for dysuria. Musculoskeletal positive for back pain. Skin: Negative for rash. Neurological: Negative for headaches, focal weakness or numbness. Endocrine:Hypertension ____________________________________________   PHYSICAL EXAM:  VITAL SIGNS: ED Triage Vitals  Enc Vitals Group     BP --      Pulse --      Resp --      Temp --      Temp src --      SpO2 --      Weight --      Height --      Head Cir --      Peak Flow --      Pain Score 06/24/15 1306 10     Pain Loc --      Pain Edu? --      Excl. in GC? --     Constitutional: Alert and oriented. Well appearing and in no acute distress. Eyes: Conjunctivae are normal. PERRL. EOMI. Head: Atraumatic. Nose: No congestion/rhinnorhea. Mouth/Throat: Mucous membranes are moist.  Oropharynx non-erythematous. Neck: No stridor.  No cervical spine tenderness to palpation. Hematological/Lymphatic/Immunilogical: No cervical lymphadenopathy. Cardiovascular: Normal rate, regular rhythm. Grossly normal heart sounds.  Good peripheral circulation. Respiratory: Normal respiratory effort.  No retractions. Lungs CTAB. Gastrointestinal: Soft and nontender. No distention. No abdominal bruits. No CVA tenderness. Musculoskeletal: No obvious spinal deformity. Patient stands with heavy reliance on upper extremities. Patient is some moderate guarding palpation L3 and 4. Patient has negative straight leg test. Neurologic:  Normal speech and language. No gross focal neurologic deficits are appreciated. No gait instability. Skin:  Skin is warm, dry and intact. No rash noted. Psychiatric: Mood and affect are normal. Speech and behavior are normal.  ____________________________________________   LABS (all labs ordered are listed, but only abnormal results are displayed)  Labs Reviewed - No data to  display ____________________________________________  EKG   ____________________________________________  RADIOLOGY   ____________________________________________   PROCEDURES  Procedure(s) performed: None  Critical Care performed: No  ____________________________________________  INITIAL IMPRESSION / ASSESSMENT AND PLAN / ED COURSE  Pertinent labs & imaging results that were available during my care of the patient were reviewed by me and considered in my medical decision making (see chart for details).  Acute lumbar strain. Patient given discharge care instructions. Patient given a work note for 2 days. Patient given a prescription for Valium and naproxen. Patient advised follow-up with family doctor if no improvement or worsening complaint in the next 3 days. ____________________________________________   FINAL CLINICAL IMPRESSION(S) / ED DIAGNOSES  Final diagnoses:  Lumbar strain, subsequent encounter      Joni Reining, PA-C 06/24/15 1343  Arnaldo Natal, MD 06/24/15 [phone]

## 2015-06-24 NOTE — Discharge Instructions (Signed)
Take medication as directed.

## 2015-06-27 ENCOUNTER — Encounter: Payer: Self-pay | Admitting: Emergency Medicine

## 2015-06-27 ENCOUNTER — Emergency Department
Admission: EM | Admit: 2015-06-27 | Discharge: 2015-06-27 | Disposition: A | Discharge status: Home / Self Care | Payer: Self-pay | Attending: Emergency Medicine | Admitting: Emergency Medicine

## 2015-06-27 DIAGNOSIS — Z880 Allergy status to penicillin: Secondary | ICD-10-CM | POA: Insufficient documentation

## 2015-06-27 DIAGNOSIS — Z7902 Long term (current) use of antithrombotics/antiplatelets: Secondary | ICD-10-CM | POA: Insufficient documentation

## 2015-06-27 DIAGNOSIS — Z79899 Other long term (current) drug therapy: Secondary | ICD-10-CM | POA: Insufficient documentation

## 2015-06-27 DIAGNOSIS — Z7982 Long term (current) use of aspirin: Secondary | ICD-10-CM | POA: Insufficient documentation

## 2015-06-27 DIAGNOSIS — X58XXXD Exposure to other specified factors, subsequent encounter: Secondary | ICD-10-CM | POA: Insufficient documentation

## 2015-06-27 DIAGNOSIS — Z791 Long term (current) use of non-steroidal anti-inflammatories (NSAID): Secondary | ICD-10-CM | POA: Insufficient documentation

## 2015-06-27 DIAGNOSIS — S39012D Strain of muscle, fascia and tendon of lower back, subsequent encounter: Secondary | ICD-10-CM | POA: Insufficient documentation

## 2015-06-27 DIAGNOSIS — F17210 Nicotine dependence, cigarettes, uncomplicated: Secondary | ICD-10-CM | POA: Insufficient documentation

## 2015-06-27 DIAGNOSIS — I10 Essential (primary) hypertension: Secondary | ICD-10-CM | POA: Insufficient documentation

## 2015-06-27 MED ORDER — DEXAMETHASONE SODIUM PHOSPHATE 4 MG/ML IJ SOLN
4.0000 mg | Freq: Once | INTRAMUSCULAR | Status: AC
  Administered 2015-06-27: 4 mg via INTRAMUSCULAR
  Filled 2015-06-27: qty 1

## 2015-06-27 MED ORDER — KETOROLAC TROMETHAMINE 30 MG/ML IJ SOLN
30.0000 mg | Freq: Once | INTRAMUSCULAR | Status: AC
  Administered 2015-06-27: 30 mg via INTRAMUSCULAR
  Filled 2015-06-27: qty 1

## 2015-06-27 MED ORDER — PREDNISONE 10 MG PO TABS: 50.0000 mg | ORAL_TABLET | Freq: Every day | ORAL | Status: DC

## 2015-06-27 MED ORDER — CYCLOBENZAPRINE HCL 10 MG PO TABS: 10.0000 mg | ORAL_TABLET | Freq: Three times a day (TID) | ORAL | Status: DC | PRN

## 2015-06-27 NOTE — ED Provider Notes (Signed)
[facility] Emergency Department Provider Note  ____________________________________________  Time seen: Approximately 11:58 AM  I have reviewed the triage vital signs and the nursing notes.   HISTORY  Chief Complaint Back Pain    HPI Kenneth Morales is a 50 y.o. male who presents for evaluation of low back pain. Patient states he was seen here 3 days ago for the same no relief with current prescription medication of Flexeril and Naprosyn. He states that he has had a past medical history of back pains on and off usually gets resolved with the shop at this time pain does not seem to be helping. In addition desires a work note for back to work on Monday. Currently describes his pain as 10 over 10 around the right lumbar paraspinal area. Pain is increased worse with walking and bending relief minimal with rest. Denies any direct, but does a lot of lifting at work.   Past Medical History  Diagnosis Date  . Acute MI (HCC) MAY 2013  . CAD (coronary artery disease)     NSTEMI in 05/13 in setting of Cocaine use. Cath: 99% mid LCX stenosis with a large thrombus. PCI and BMS (4.0 X15 mm Vision) placement to mid LCX, LAD: 20%, RCA: 30%, EF: 60%.   . Hyperlipidemia   . Hypertension   . Tobacco abuse   . Cocaine abuse     quit in 08/2011  . Spider bite   . Depression     Patient Active Problem List   Diagnosis Date Noted  . SOB (shortness of breath) 12/10/2011  . CAD (coronary artery disease)   . Hyperlipidemia   . Hypertension   . Tobacco abuse   . Cocaine abuse     Past Surgical History  Procedure Laterality Date  . Hemorrhoid surgery    . Cardiac catheterization  MAY 2013    s/p stent @ [facility]  . Cardiac catheterization      Current Outpatient Rx  Name  Route  Sig  Dispense  Refill  . aspirin 81 MG tablet   Oral   Take 81 mg by mouth daily.         . clopidogrel (PLAVIX) 75 MG tablet   Oral   Take 75 mg by mouth daily.         .  cyclobenzaprine (FLEXERIL) 10 MG tablet   Oral   Take 1 tablet (10 mg total) by mouth every 8 (eight) hours as needed for muscle spasms.   30 tablet   1   . docusate sodium (COLACE) 100 MG capsule   Oral   Take 2 capsules (200 mg total) by mouth 2 (two) times daily.   120 capsule   3   . lisinopril (PRINIVIL,ZESTRIL) 40 MG tablet   Oral   Take 40 mg by mouth daily.         . metoprolol (LOPRESSOR) 50 MG tablet   Oral   Take 50 mg by mouth 2 (two) times daily.         . naproxen (NAPROSYN) 500 MG tablet   Oral   Take 1 tablet (500 mg total) by mouth 2 (two) times daily with a meal.   20 tablet   2   . naproxen (NAPROSYN) 500 MG tablet   Oral   Take 1 tablet (500 mg total) by mouth 2 (two) times daily with a meal.   20 tablet   0   . pramoxine (PROCTOFOAM) 1 % foam  Rectal   Place 1 application rectally 3 (three) times daily as needed for itching.   15 g   0   . pravastatin (PRAVACHOL) 20 MG tablet   Oral   Take 20 mg by mouth daily.         . predniSONE (DELTASONE) 10 MG tablet   Oral   Take 5 tablets (50 mg total) by mouth daily with breakfast.   25 tablet   0   . Witch Hazel (TUCKS) 50 % PADS   Apply externally   Apply 1 application topically 3 (three) times daily as needed.   40 each   3     Allergies Penicillins  Family History  Problem Relation Age of Onset  . Heart attack Maternal Grandfather   . Heart attack Paternal Grandfather     Social History Social History  Substance Use Topics  . Smoking status: Current Every Day Smoker -- 0.50 packs/day for 30 years    Types: Cigarettes  . Smokeless tobacco: None  . Alcohol Use: 0.0 oz/week    1-2 Cans of beer per week    Review of Systems Constitutional: No fever/chills Eyes: No visual changes. ENT: No sore throat. Cardiovascular: Denies chest pain. Respiratory: Denies shortness of breath. Gastrointestinal: No abdominal pain.  No nausea, no vomiting.  No diarrhea.  No  constipation. Genitourinary: Negative for dysuria. Musculoskeletal: Positive for lumbar paraspinal back pain. Skin: Negative for rash. Neurological: Negative for headaches, focal weakness or numbness.  10-point ROS otherwise negative.  ____________________________________________   PHYSICAL EXAM:  VITAL SIGNS: ED Triage Vitals  Enc Vitals Group     BP 06/27/15 1135 123/77 mmHg     Pulse Rate 06/27/15 1135 94     Resp 06/27/15 1135 18     Temp 06/27/15 1135 98.6 F (37 C)     Temp Source 06/27/15 1135 Oral     SpO2 06/27/15 1136 95 %     Weight 06/27/15 1135 160 lb (72.576 kg)     Height 06/27/15 1135  (1.778 m)     Head Cir --      Peak Flow --      Pain Score 06/27/15 1135 10     Pain Loc --      Pain Edu? --      Excl. in GC? --     Constitutional: Alert and oriented. Well appearing and in no acute distress.r.   Cardiovascular: Normal rate, regular rhythm. Grossly normal heart sounds.  Good peripheral circulation. Respiratory: Normal respiratory effort.  No retractions. Lungs CTAB. Musculoskeletal: No lower extremity tenderness nor edema.  No joint effusions. Straight leg raise was negative bilaterally point tenderness noted to the lumbar right paraspinal region.  Neurologic:  Normal speech and language. No gross focal neurologic deficits are appreciated. No gait instability. Distally neurovascularly intact. Skin:  Skin is warm, dry and intact. No rash noted. Psychiatric: Mood and affect are normal. Speech and behavior are normal.  ____________________________________________   LABS (all labs ordered are listed, but only abnormal results are displayed)  Labs Reviewed - No data to display ____________________________________________   PROCEDURES  Procedure(s) performed: None  Critical Care performed: No  ____________________________________________   INITIAL IMPRESSION / ASSESSMENT AND PLAN / ED COURSE  Pertinent labs & imaging results that were  available during my care of the patient were reviewed by me and considered in my medical decision making (see chart for details).  Acute exacerbation of lumbar pain. Rx encourage the patient to  use Flexeril and stop the Valium. Toradol 30 mg IM given while in the ED. Patient started on a 5 day burst of prednisone and to follow up with PCP or return to the ER. Work excuse given until Monday. Voices no other emergency medical complaints at this time. ____________________________________________   FINAL CLINICAL IMPRESSION(S) / ED DIAGNOSES  Final diagnoses:  Lumbar strain, subsequent encounter     This chart was dictated using voice recognition software/Dragon. Despite best efforts to proofread, errors can occur which can change the meaning. Any change was purely unintentional.   Evangeline Dakinharles M Baer Hinton, PA-C 06/27/15 1234  Governor Rooksebecca Lord, MD 06/27/15 1350

## 2015-06-27 NOTE — ED Notes (Signed)
Lower back pain since last Sun, was seen here earlier this week for the same.

## 2015-09-23 ENCOUNTER — Emergency Department
Admission: EM | Admit: 2015-09-23 | Discharge: 2015-09-23 | Disposition: A | Discharge status: Home / Self Care | Payer: Self-pay | Attending: Student | Admitting: Student

## 2015-09-23 ENCOUNTER — Encounter: Payer: Self-pay | Admitting: Medical Oncology

## 2015-09-23 ENCOUNTER — Emergency Department: Payer: Self-pay

## 2015-09-23 DIAGNOSIS — Z79899 Other long term (current) drug therapy: Secondary | ICD-10-CM | POA: Insufficient documentation

## 2015-09-23 DIAGNOSIS — I10 Essential (primary) hypertension: Secondary | ICD-10-CM | POA: Insufficient documentation

## 2015-09-23 DIAGNOSIS — F17210 Nicotine dependence, cigarettes, uncomplicated: Secondary | ICD-10-CM | POA: Insufficient documentation

## 2015-09-23 DIAGNOSIS — I252 Old myocardial infarction: Secondary | ICD-10-CM | POA: Insufficient documentation

## 2015-09-23 DIAGNOSIS — F329 Major depressive disorder, single episode, unspecified: Secondary | ICD-10-CM | POA: Insufficient documentation

## 2015-09-23 DIAGNOSIS — E785 Hyperlipidemia, unspecified: Secondary | ICD-10-CM | POA: Insufficient documentation

## 2015-09-23 DIAGNOSIS — R0789 Other chest pain: Secondary | ICD-10-CM | POA: Insufficient documentation

## 2015-09-23 DIAGNOSIS — Z7982 Long term (current) use of aspirin: Secondary | ICD-10-CM | POA: Insufficient documentation

## 2015-09-23 DIAGNOSIS — R079 Chest pain, unspecified: Secondary | ICD-10-CM

## 2015-09-23 DIAGNOSIS — I2510 Atherosclerotic heart disease of native coronary artery without angina pectoris: Secondary | ICD-10-CM | POA: Insufficient documentation

## 2015-09-23 LAB — BASIC METABOLIC PANEL
Anion gap: 6 (ref 5–15)
BUN: 10 mg/dL (ref 6–20)
CO2: 26 mmol/L (ref 22–32)
Calcium: 9.2 mg/dL (ref 8.9–10.3)
Chloride: 107 mmol/L (ref 101–111)
Creatinine, Ser: 0.88 mg/dL (ref 0.61–1.24)
GFR calc Af Amer: >60 mL/min (ref >60)
GFR calc non Af Amer: >60 mL/min (ref >60)
Glucose, Bld: 89 mg/dL (ref 65–99)
Potassium: 3.8 mmol/L (ref 3.5–5.1)
Sodium: 139 mmol/L (ref 135–145)

## 2015-09-23 LAB — CBC
HCT: 44.6 % (ref 40.0–52.0)
Hemoglobin: 15.3 g/dL (ref 13.0–18.0)
MCH: 32.8 pg (ref 26.0–34.0)
MCHC: 34.3 g/dL (ref 32.0–36.0)
MCV: 95.8 fL (ref 80.0–100.0)
Platelets: 226 10*3/uL (ref 150–440)
RBC: 4.65 MIL/uL (ref 4.40–5.90)
RDW: 13.2 % (ref 11.5–14.5)
WBC: 10.2 10*3/uL (ref 3.8–10.6)

## 2015-09-23 LAB — TROPONIN I: Troponin I: <0.03 ng/mL (ref <0.031)

## 2015-09-23 MED ORDER — ASPIRIN 81 MG PO CHEW
324.0000 mg | CHEWABLE_TABLET | Freq: Once | ORAL | Status: AC
  Administered 2015-09-23: 324 mg via ORAL
  Filled 2015-09-23: qty 4

## 2015-09-23 NOTE — ED Notes (Signed)
Pt presents with central chest pain since yesterday. States pain is intermittent, "kind of like I need to burp." Pt states he had MI in 2013, and this feels similar. Pt states he is not currently in pain, but when it comes, it is severe. Also reports SOB and diaphoresis today. NAD noted at this time.

## 2015-09-23 NOTE — ED Provider Notes (Addendum)
[facility] Emergency Department Provider Note   ____________________________________________  Time seen: Approximately 11:20 AM  I have reviewed the triage vital signs and the nursing notes.   HISTORY  Chief Complaint Chest Pain    HPI Kenneth Morales is a 50 y.o. male with history of coronaryartery disease status post bare metal stent to left circumflex in 2013, hypertension, hyperlipidemia, tobacco use, history of cocaine use who presents for evaluation of intermittent central chest pain since yesterday, usually gradual onset and self resolves within 20-30 minutes, no modifying factors. Patient reports he had central chest tightness at rest yesterday which was self resolving, it was not worsened with exertion. Today while he was at work he also had 30 minutes of the same as tightness. He reports that when he has the pain he feels a little bit sweaty and short of breath, but he also feels "like there is foam in my throat and I need to burp or spit up ". The pain does not radiate into the arms, torso back or travel down towards the feet. He reports that his pain is in the same location as when he had a prior heart attack but the character of the pain is much different. He reports that previously when he had a heart attack he had pins and needles in his central chest that was radiating into the arms, he is not having that currently. He denies any pleuritic chest pain, no history of PE or DVT. No cough, vomiting, diarrhea, fevers or chills. He reports he had pain between 10 AM and 10:30 AM but pain has since resolved and currently he is chest pain-free.   Past Medical History  Diagnosis Date  . Acute MI (HCC) MAY 2013  . CAD (coronary artery disease)     NSTEMI in 05/13 in setting of Cocaine use. Cath: 99% mid LCX stenosis with a large thrombus. PCI and BMS (4.0 X15 mm Vision) placement to mid LCX, LAD: 20%, RCA: 30%, EF: 60%.   . Hyperlipidemia   . Hypertension    . Tobacco abuse   . Cocaine abuse     quit in 08/2011  . Spider bite   . Depression     Patient Active Problem List   Diagnosis Date Noted  . SOB (shortness of breath) 12/10/2011  . CAD (coronary artery disease)   . Hyperlipidemia   . Hypertension   . Tobacco abuse   . Cocaine abuse     Past Surgical History  Procedure Laterality Date  . Hemorrhoid surgery    . Cardiac catheterization  MAY 2013    s/p stent @ [facility]  . Cardiac catheterization      Current Outpatient Rx  Name  Route  Sig  Dispense  Refill  . aspirin 81 MG tablet   Oral   Take 81 mg by mouth daily.         . clopidogrel (PLAVIX) 75 MG tablet   Oral   Take 75 mg by mouth daily.         . cyclobenzaprine (FLEXERIL) 10 MG tablet   Oral   Take 1 tablet (10 mg total) by mouth every 8 (eight) hours as needed for muscle spasms.   30 tablet   1   . docusate sodium (COLACE) 100 MG capsule   Oral   Take 2 capsules (200 mg total) by mouth 2 (two) times daily.   120 capsule   3   . lisinopril (PRINIVIL,ZESTRIL) 40 MG  tablet   Oral   Take 40 mg by mouth daily.         . metoprolol (LOPRESSOR) 50 MG tablet   Oral   Take 50 mg by mouth 2 (two) times daily.         . naproxen (NAPROSYN) 500 MG tablet   Oral   Take 1 tablet (500 mg total) by mouth 2 (two) times daily with a meal. Patient not taking: Reported on 09/23/2015   20 tablet   2   . naproxen (NAPROSYN) 500 MG tablet   Oral   Take 1 tablet (500 mg total) by mouth 2 (two) times daily with a meal.   20 tablet   0   . pramoxine (PROCTOFOAM) 1 % foam   Rectal   Place 1 application rectally 3 (three) times daily as needed for itching. Patient not taking: Reported on 09/23/2015   15 g   0   . pravastatin (PRAVACHOL) 20 MG tablet   Oral   Take 20 mg by mouth daily.         . predniSONE (DELTASONE) 10 MG tablet   Oral   Take 5 tablets (50 mg total) by mouth daily with breakfast. Patient not taking: Reported on 09/23/2015   25  tablet   0   . Witch Hazel (TUCKS) 50 % PADS   Apply externally   Apply 1 application topically 3 (three) times daily as needed. Patient not taking: Reported on 09/23/2015   40 each   3     Allergies Penicillins  Family History  Problem Relation Age of Onset  . Heart attack Maternal Grandfather   . Heart attack Paternal Grandfather     Social History Social History  Substance Use Topics  . Smoking status: Current Every Day Smoker -- 0.50 packs/day for 30 years    Types: Cigarettes  . Smokeless tobacco: None  . Alcohol Use: 0.0 oz/week    1-2 Cans of beer per week    Review of Systems Constitutional: No fever/chills Eyes: No visual changes. ENT: No sore throat. Cardiovascular: +chest pain. Respiratory: +shortness of breath. Gastrointestinal: No abdominal pain.  No nausea, no vomiting.  No diarrhea.  No constipation. Genitourinary: Negative for dysuria. Musculoskeletal: Negative for back pain. Skin: Negative for rash. Neurological: Negative for headaches, focal weakness or numbness.  10-point ROS otherwise negative.  ____________________________________________   PHYSICAL EXAM:  Filed Vitals:   09/23/15 1100 09/23/15 1115 09/23/15 1130 09/23/15 1200  BP:    141/96  Pulse: 68 67 71 78  Temp:      TempSrc:      Resp: 13 19 21 16   Height:      Weight:      SpO2: 99% 98% 99% 100%    VITAL SIGNS: ED Triage Vitals  Enc Vitals Group     BP 09/23/15 1022 161/108 mmHg     Pulse Rate 09/23/15 1022 74     Resp 09/23/15 1022 20     Temp 09/23/15 1022 98.2 F (36.8 C)     Temp Source 09/23/15 1022 Oral     SpO2 09/23/15 1022 100 %     Weight 09/23/15 1022 160 lb (72.576 kg)     Height 09/23/15 1022 5\' 10"  (1.778 m)     Head Cir --      Peak Flow --      Pain Score 09/23/15 1022 6     Pain Loc --      Pain Edu? --  Excl. in GC? --     Constitutional: Alert and oriented. Well appearing and in no acute distress. Eyes: Conjunctivae are normal. PERRL.  EOMI. Head: Atraumatic. Nose: No congestion/rhinnorhea. Mouth/Throat: Mucous membranes are moist.  Oropharynx non-erythematous. Neck: No stridor.  Supple without meningismus. Cardiovascular: Normal rate, regular rhythm. Grossly normal heart sounds.  Good peripheral circulation. Respiratory: Normal respiratory effort.  No retractions. Lungs CTAB. Gastrointestinal: Soft and nontender. No distention.  No CVA tenderness. Genitourinary: deferred Musculoskeletal: No lower extremity tenderness nor edema.  No joint effusions. No calf swelling/asymmetry/tenderness. Neurologic:  Normal speech and language. No gross focal neurologic deficits are appreciated. No gait instability. Skin:  Skin is warm, dry and intact. No rash noted. Psychiatric: Mood and affect are normal. Speech and behavior are normal.  ____________________________________________   LABS (all labs ordered are listed, but only abnormal results are displayed)  Labs Reviewed  BASIC METABOLIC PANEL  CBC  TROPONIN I   ____________________________________________  EKG  ED ECG REPORT I, Gayla Doss, the attending physician, personally viewed and interpreted this ECG.   Date: 09/23/2015  EKG Time: 10:20  Rate: 72  Rhythm: normal sinus rhythm  Axis: normal  Intervals:none  ST&T Change: No acute ST elevation. T-wave inversions in V4, V5, V6, lead 1, aVL are chronic and there is no change in the EKG when compared to the EKG obtained on 06/04/2014.  ____________________________________________  RADIOLOGY  CXR IMPRESSION: No active cardiopulmonary disease. ____________________________________________   PROCEDURES  Procedure(s) performed: None  Critical Care performed: No  ____________________________________________   INITIAL IMPRESSION / ASSESSMENT AND PLAN / ED COURSE  Pertinent labs & imaging results that were available during my care of the patient were reviewed by me and considered in my medical decision  making (see chart for details).  Kenneth Morales is a 50 y.o. male with history of coronaryartery disease status post bare metal stent to left circumflex in 2013, hypertension, hyperlipidemia, tobacco use, history of cocaine use who presents for evaluation of intermittent central chest pain since yesterday. On exam, he is very well-appearing and in no acute distress. Bowel signs stable, he is afebrile. Currently he is chest pain-free and requesting to go home. His EKG is unchanged from 06/04/2014. Of note, he was seen here at [facility] for chest pain in 2014, had negative cardiac enzymes 3 and normal Myoview stress test. His symptoms today sound a bit atypical for ACS but given his history, we'll obtain cardiac enzymes and discussed the case with cardiology, we'll observe on a cardiac monitor. Pain not ripping or tearing in nature, does not radiate to the back or down towards the feet, doubt acute aortic dissection. No pleuritic pain, no clinical evidence to support DVT, no hypoxia or increased work of breathing, doubt PE. We'll give aspirin. Reassess for disposition.   ----------------------------------------- 12:51 PM on 09/23/2015 ----------------------------------------- CBC, BMP unremarkable, troponin negative. Chest x-ray with no acute cardiopulmonary disease. The patient is sleeping. When I awaken him, he reports he remains chest pain free and he has had no recurrence of chest pain since arrival to the ER. His vital signs remained stable. I discussed the case with Dr. Adrian Blackwater of cardiology, he has previously seen the patient in clinic. We discussed the case and Dr. Welton Flakes recommends the patient be sent to his office now and he will evaluate the patient and perform an echocardiogram to evaluate for wall motion abnormalities. He does not recommend the patient stay in the ER for second troponin as he can  perform additional testing at the office. I discussed this with the patient and he is  comfortable with the plan. He will be discharged from the emergency department and will head straight over to Dr. Milta DeitersKhan's office for evaluation. I discussed with him that if at anytime he develops any additional chest pain, trouble breathing or any other concerns he should return to the emergency department. He voices understanding of this. DC home.  ____________________________________________   FINAL CLINICAL IMPRESSION(S) / ED DIAGNOSES  Final diagnoses:  Chest pain, unspecified chest pain type      NEW MEDICATIONS STARTED DURING THIS VISIT:  New Prescriptions   No medications on file     Note:  This document was prepared using Dragon voice recognition software and may include unintentional dictation errors.    Gayla DossEryka A Everlena Mackley, MD 09/23/15 1255  Gayla DossEryka A Leonila Speranza, MD 09/23/15 [phone]

## 2015-09-23 NOTE — ED Notes (Signed)
Pt reports central chest tightness that began yesterday but went away but worsened again this am, pt has had MI in past and this feels similar. Pt also reports sob

## 2016-07-01 ENCOUNTER — Encounter: Payer: Self-pay | Admitting: Emergency Medicine

## 2016-07-01 ENCOUNTER — Other Ambulatory Visit: Payer: Self-pay

## 2016-07-01 ENCOUNTER — Observation Stay
Admission: EM | Admit: 2016-07-01 | Discharge: 2016-07-02 | Disposition: A | Discharge status: Home / Self Care | Payer: Self-pay | Attending: Internal Medicine | Admitting: Internal Medicine

## 2016-07-01 ENCOUNTER — Emergency Department: Payer: Self-pay

## 2016-07-01 DIAGNOSIS — Z7982 Long term (current) use of aspirin: Secondary | ICD-10-CM | POA: Insufficient documentation

## 2016-07-01 DIAGNOSIS — R079 Chest pain, unspecified: Secondary | ICD-10-CM

## 2016-07-01 DIAGNOSIS — Z91199 Patient's noncompliance with other medical treatment and regimen due to unspecified reason: Secondary | ICD-10-CM

## 2016-07-01 DIAGNOSIS — Z880 Allergy status to penicillin: Secondary | ICD-10-CM | POA: Insufficient documentation

## 2016-07-01 DIAGNOSIS — I252 Old myocardial infarction: Secondary | ICD-10-CM | POA: Insufficient documentation

## 2016-07-01 DIAGNOSIS — I160 Hypertensive urgency: Secondary | ICD-10-CM | POA: Insufficient documentation

## 2016-07-01 DIAGNOSIS — I208 Other forms of angina pectoris: Principal | ICD-10-CM | POA: Insufficient documentation

## 2016-07-01 DIAGNOSIS — Z79899 Other long term (current) drug therapy: Secondary | ICD-10-CM | POA: Insufficient documentation

## 2016-07-01 DIAGNOSIS — I10 Essential (primary) hypertension: Secondary | ICD-10-CM

## 2016-07-01 DIAGNOSIS — E785 Hyperlipidemia, unspecified: Secondary | ICD-10-CM | POA: Insufficient documentation

## 2016-07-01 DIAGNOSIS — Z9119 Patient's noncompliance with other medical treatment and regimen: Secondary | ICD-10-CM | POA: Insufficient documentation

## 2016-07-01 DIAGNOSIS — F329 Major depressive disorder, single episode, unspecified: Secondary | ICD-10-CM | POA: Insufficient documentation

## 2016-07-01 DIAGNOSIS — Z7902 Long term (current) use of antithrombotics/antiplatelets: Secondary | ICD-10-CM | POA: Insufficient documentation

## 2016-07-01 DIAGNOSIS — F17210 Nicotine dependence, cigarettes, uncomplicated: Secondary | ICD-10-CM | POA: Insufficient documentation

## 2016-07-01 DIAGNOSIS — Z8249 Family history of ischemic heart disease and other diseases of the circulatory system: Secondary | ICD-10-CM | POA: Insufficient documentation

## 2016-07-01 DIAGNOSIS — Z87898 Personal history of other specified conditions: Secondary | ICD-10-CM | POA: Insufficient documentation

## 2016-07-01 DIAGNOSIS — Z9114 Patient's other noncompliance with medication regimen: Secondary | ICD-10-CM | POA: Insufficient documentation

## 2016-07-01 DIAGNOSIS — I2510 Atherosclerotic heart disease of native coronary artery without angina pectoris: Secondary | ICD-10-CM | POA: Insufficient documentation

## 2016-07-01 LAB — CBC
HCT: 41.6 % (ref 40.0–52.0)
Hemoglobin: 14.7 g/dL (ref 13.0–18.0)
MCH: 33.9 pg (ref 26.0–34.0)
MCHC: 35.2 g/dL (ref 32.0–36.0)
MCV: 96.1 fL (ref 80.0–100.0)
Platelets: 226 10*3/uL (ref 150–440)
RBC: 4.33 MIL/uL — ABNORMAL LOW (ref 4.40–5.90)
RDW: 13.3 % (ref 11.5–14.5)
WBC: 6.7 10*3/uL (ref 3.8–10.6)

## 2016-07-01 LAB — URINE DRUG SCREEN, QUALITATIVE (ARMC ONLY)
Amphetamines, Ur Screen: NOT DETECTED
Barbiturates, Ur Screen: NOT DETECTED
Benzodiazepine, Ur Scrn: NOT DETECTED
Cannabinoid 50 Ng, Ur ~~LOC~~: NOT DETECTED
Cocaine Metabolite,Ur ~~LOC~~: NOT DETECTED
MDMA (Ecstasy)Ur Screen: NOT DETECTED
Methadone Scn, Ur: NOT DETECTED
Opiate, Ur Screen: NOT DETECTED
Phencyclidine (PCP) Ur S: NOT DETECTED
Tricyclic, Ur Screen: NOT DETECTED

## 2016-07-01 LAB — HEPATIC FUNCTION PANEL
ALT: 13 U/L — ABNORMAL LOW (ref 17–63)
AST: 22 U/L (ref 15–41)
Albumin: 4.2 g/dL (ref 3.5–5.0)
Alkaline Phosphatase: 73 U/L (ref 38–126)
Bilirubin, Direct: <0.1 mg/dL — ABNORMAL LOW (ref 0.1–0.5)
Total Bilirubin: 0.6 mg/dL (ref 0.3–1.2)
Total Protein: 7.5 g/dL (ref 6.5–8.1)

## 2016-07-01 LAB — BASIC METABOLIC PANEL
Anion gap: 7 (ref 5–15)
BUN: 9 mg/dL (ref 6–20)
CO2: 26 mmol/L (ref 22–32)
Calcium: 9.4 mg/dL (ref 8.9–10.3)
Chloride: 109 mmol/L (ref 101–111)
Creatinine, Ser: 0.62 mg/dL (ref 0.61–1.24)
GFR calc Af Amer: >60 mL/min (ref >60)
GFR calc non Af Amer: >60 mL/min (ref >60)
Glucose, Bld: 92 mg/dL (ref 65–99)
Potassium: 3.9 mmol/L (ref 3.5–5.1)
Sodium: 142 mmol/L (ref 135–145)

## 2016-07-01 LAB — TSH: TSH: 2.882 u[IU]/mL (ref 0.350–4.500)

## 2016-07-01 LAB — PROTIME-INR
INR: 0.94
Prothrombin Time: 12.6 seconds (ref 11.4–15.2)

## 2016-07-01 LAB — TROPONIN I
Troponin I: <0.03 ng/mL (ref <0.03)
Troponin I: <0.03 ng/mL (ref <0.03)

## 2016-07-01 LAB — APTT: aPTT: 30 seconds (ref 24–36)

## 2016-07-01 LAB — LIPASE, BLOOD: Lipase: 12 U/L (ref 11–51)

## 2016-07-01 MED ORDER — INFLUENZA VAC SPLIT QUAD 0.5 ML IM SUSY
0.5000 mL | PREFILLED_SYRINGE | INTRAMUSCULAR | Status: AC
  Administered 2016-07-02: 0.5 mL via INTRAMUSCULAR
  Filled 2016-07-01: qty 0.5

## 2016-07-01 MED ORDER — NITROGLYCERIN 2 % TD OINT
TOPICAL_OINTMENT | TRANSDERMAL | Status: AC
  Administered 2016-07-01: 1 [in_us] via TOPICAL
  Filled 2016-07-01: qty 1

## 2016-07-01 MED ORDER — ASPIRIN 81 MG PO CHEW
CHEWABLE_TABLET | ORAL | Status: AC
  Administered 2016-07-01: 324 mg via ORAL
  Filled 2016-07-01: qty 4

## 2016-07-01 MED ORDER — SODIUM CHLORIDE 0.9% FLUSH: 3.0000 mL | INTRAVENOUS | Status: DC | PRN

## 2016-07-01 MED ORDER — LABETALOL HCL 5 MG/ML IV SOLN
20.0000 mg | Freq: Once | INTRAVENOUS | Status: AC
  Administered 2016-07-01: 20 mg via INTRAVENOUS

## 2016-07-01 MED ORDER — LORAZEPAM 2 MG/ML IJ SOLN
1.0000 mg | Freq: Once | INTRAMUSCULAR | Status: AC
  Administered 2016-07-01: 1 mg via INTRAVENOUS

## 2016-07-01 MED ORDER — PNEUMOCOCCAL VAC POLYVALENT 25 MCG/0.5ML IJ INJ
0.5000 mL | INJECTION | INTRAMUSCULAR | Status: AC
  Administered 2016-07-02: 0.5 mL via INTRAMUSCULAR
  Filled 2016-07-01 (×2): qty 0.5

## 2016-07-01 MED ORDER — ONDANSETRON HCL 4 MG/2ML IJ SOLN
4.0000 mg | Freq: Four times a day (QID) | INTRAMUSCULAR | Status: DC | PRN
Start: 2016-07-01 — End: 2016-07-02

## 2016-07-01 MED ORDER — LABETALOL HCL 5 MG/ML IV SOLN: 10.0000 mg | Freq: Once | INTRAVENOUS | Status: DC

## 2016-07-01 MED ORDER — ASPIRIN EC 81 MG PO TBEC
81.0000 mg | DELAYED_RELEASE_TABLET | Freq: Every day | ORAL | Status: DC
  Administered 2016-07-01 – 2016-07-02 (×2): 81 mg via ORAL
  Filled 2016-07-01 (×2): qty 1

## 2016-07-01 MED ORDER — NICOTINE 14 MG/24HR TD PT24
14.0000 mg | MEDICATED_PATCH | Freq: Every day | TRANSDERMAL | Status: DC
Start: 2016-07-02 — End: 2016-07-02

## 2016-07-01 MED ORDER — ASPIRIN 81 MG PO CHEW
324.0000 mg | CHEWABLE_TABLET | Freq: Once | ORAL | Status: AC
Start: 2016-07-01 — End: 2016-07-01
  Administered 2016-07-01: 324 mg via ORAL

## 2016-07-01 MED ORDER — LISINOPRIL 20 MG PO TABS
ORAL_TABLET | ORAL | Status: AC
  Administered 2016-07-01: 40 mg via ORAL
  Filled 2016-07-01: qty 2

## 2016-07-01 MED ORDER — LABETALOL HCL 5 MG/ML IV SOLN
INTRAVENOUS | Status: AC
  Administered 2016-07-01: 20 mg via INTRAVENOUS
  Filled 2016-07-01: qty 4

## 2016-07-01 MED ORDER — LISINOPRIL 10 MG PO TABS
40.0000 mg | ORAL_TABLET | Freq: Every day | ORAL | Status: DC
  Administered 2016-07-01 – 2016-07-02 (×3): 40 mg via ORAL
  Filled 2016-07-01 (×2): qty 4

## 2016-07-01 MED ORDER — METOPROLOL TARTRATE 25 MG PO TABS
25.0000 mg | ORAL_TABLET | Freq: Two times a day (BID) | ORAL | Status: DC
  Administered 2016-07-01 – 2016-07-02 (×2): 25 mg via ORAL
  Filled 2016-07-01 (×2): qty 1

## 2016-07-01 MED ORDER — LABETALOL HCL 5 MG/ML IV SOLN
10.0000 mg | Freq: Once | INTRAVENOUS | Status: AC
  Administered 2016-07-01: 10 mg via INTRAVENOUS

## 2016-07-01 MED ORDER — PRAVASTATIN SODIUM 20 MG PO TABS
20.0000 mg | ORAL_TABLET | Freq: Every day | ORAL | Status: DC
  Administered 2016-07-01 – 2016-07-02 (×2): 20 mg via ORAL
  Filled 2016-07-01 (×2): qty 1

## 2016-07-01 MED ORDER — NITROGLYCERIN 2 % TD OINT
1.0000 [in_us] | TOPICAL_OINTMENT | Freq: Once | TRANSDERMAL | Status: AC
  Administered 2016-07-01: 1 [in_us] via TOPICAL

## 2016-07-01 MED ORDER — NITROGLYCERIN 2 % TD OINT
1.0000 [in_us] | TOPICAL_OINTMENT | Freq: Four times a day (QID) | TRANSDERMAL | Status: DC
  Administered 2016-07-02: 1 [in_us] via TOPICAL
  Filled 2016-07-01: qty 1

## 2016-07-01 MED ORDER — ENOXAPARIN SODIUM 40 MG/0.4ML ~~LOC~~ SOLN
40.0000 mg | SUBCUTANEOUS | Status: DC
  Administered 2016-07-01: 40 mg via SUBCUTANEOUS
  Filled 2016-07-01: qty 0.4

## 2016-07-01 MED ORDER — CLOPIDOGREL BISULFATE 75 MG PO TABS
75.0000 mg | ORAL_TABLET | Freq: Every day | ORAL | Status: DC
  Administered 2016-07-01 – 2016-07-02 (×2): 75 mg via ORAL
  Filled 2016-07-01 (×2): qty 1

## 2016-07-01 MED ORDER — ACETAMINOPHEN 650 MG RE SUPP: 650.0000 mg | Freq: Four times a day (QID) | RECTAL | Status: DC | PRN

## 2016-07-01 MED ORDER — SODIUM CHLORIDE 0.9% FLUSH: 3.0000 mL | Freq: Two times a day (BID) | INTRAVENOUS | Status: DC

## 2016-07-01 MED ORDER — ACETAMINOPHEN 325 MG PO TABS: 650.0000 mg | ORAL_TABLET | Freq: Four times a day (QID) | ORAL | Status: DC | PRN

## 2016-07-01 MED ORDER — LORAZEPAM 2 MG/ML IJ SOLN
INTRAMUSCULAR | Status: AC
  Administered 2016-07-01: 1 mg via INTRAVENOUS
  Filled 2016-07-01: qty 1

## 2016-07-01 MED ORDER — ONDANSETRON HCL 4 MG PO TABS: 4.0000 mg | ORAL_TABLET | Freq: Four times a day (QID) | ORAL | Status: DC | PRN

## 2016-07-01 MED ORDER — SODIUM CHLORIDE 0.9 % IV SOLN: 250.0000 mL | INTRAVENOUS | Status: DC | PRN

## 2016-07-01 MED ORDER — SODIUM CHLORIDE 0.9% FLUSH
3.0000 mL | Freq: Two times a day (BID) | INTRAVENOUS | Status: DC
  Administered 2016-07-01: 3 mL via INTRAVENOUS

## 2016-07-01 NOTE — ED Triage Notes (Signed)
Pt states out of all med x 1 month - including blood thinner and bp pill

## 2016-07-01 NOTE — H&P (Addendum)
[facility] [place] [facility]   PATIENT NAME: Kenneth Morales    MR#:  [id number]  DATE OF BIRTH:  1966/01/17  DATE OF ADMISSION:  07/01/2016  PRIMARY CARE PHYSICIAN: No PCP Per Patient   REQUESTING/REFERRING PHYSICIAN:   CHIEF COMPLAINT:   Chief Complaint  Patient presents with  . Chest Pain    HISTORY OF PRESENT ILLNESS: Kenneth Bilisimothy Yasui  is a 51 y.o. male with a known history of Coronary artery disease, status post MI in 2013, hyperlipidemia, hypertension, ongoing tobacco abuse, noncompliance, not taking these medications for the past one month, who presents to the hospital with complaints of chest pain. Continue the patient, he was doing well up until lunch time, when he started having worsening chest pain in the sternal area. Patient was describing pain as pressure type, and other 10 by intensity, lasting at least 30 minutes. Constant pain. He stated that he was not able to walk, felt like he needed to sit down, the pain was accompanied by shortness of breath, feeling sweaty, presyncopal. He was having headaches as well. There was no pain radiation, no alleviating or aggravating factors. He presented to the hospital for further evaluation and treatment, he was noted to have markedly elevated blood pressure, systolic blood pressure was intermittently as high as 204, diastolic as high as 126. EKG reveals a T depressions in multiple leads, lateral as well as inferior, also LVH . Hospitalist services were contacted for admission  PAST MEDICAL HISTORY:   Past Medical History:  Diagnosis Date  . Acute MI MAY 2013  . CAD (coronary artery disease)    NSTEMI in 05/13 in setting of Cocaine use. Cath: 99% mid LCX stenosis with a large thrombus. PCI and BMS (4.0 X15 mm Vision) placement to mid LCX, LAD: 20%, RCA: 30%, EF: 60%.   . Cocaine abuse    quit in 08/2011  . Depression   . Hyperlipidemia   . Hypertension   . Spider bite   . Tobacco abuse     PAST SURGICAL  HISTORY: Past Surgical History:  Procedure Laterality Date  . CARDIAC CATHETERIZATION  MAY 2013   s/p stent @ [facility]  . CARDIAC CATHETERIZATION    . HEMORRHOID SURGERY      SOCIAL HISTORY:  Social History  Substance Use Topics  . Smoking status: Current Every Day Smoker    Packs/day: 0.50    Years: 30.00    Types: Cigarettes  . Smokeless tobacco: Never Used  . Alcohol use 0.0 oz/week    1 - 2 Cans of beer per week     Comment: weekly    FAMILY HISTORY:  Family History  Problem Relation Age of Onset  . Heart attack Maternal Grandfather   . Heart attack Paternal Grandfather     DRUG ALLERGIES:  Allergies  Allergen Reactions  . Penicillins     rash    Review of Systems  Constitutional: Positive for malaise/fatigue. Negative for chills, fever and weight loss.  HENT: Negative for congestion.   Eyes: Negative for blurred vision and double vision.  Respiratory: Positive for shortness of breath. Negative for cough, sputum production and wheezing.   Cardiovascular: Positive for chest pain. Negative for palpitations, orthopnea, leg swelling and PND.  Gastrointestinal: Negative for abdominal pain, blood in stool, constipation, diarrhea, nausea and vomiting.  Genitourinary: Negative for dysuria, frequency, hematuria and urgency.  Musculoskeletal: Negative for falls.  Neurological: Positive for headaches. Negative for dizziness, tremors and focal weakness.  Endo/Heme/Allergies:  Does not bruise/bleed easily.  Psychiatric/Behavioral: Negative for depression. The patient does not have insomnia.     MEDICATIONS AT HOME:  Prior to Admission medications   Medication Sig Start Date End Date Taking? Authorizing Provider  aspirin 81 MG tablet Take 81 mg by mouth daily.    Historical Provider, MD  clopidogrel (PLAVIX) 75 MG tablet Take 75 mg by mouth daily.    Historical Provider, MD  cyclobenzaprine (FLEXERIL) 10 MG tablet Take 1 tablet (10 mg total) by mouth every 8 (eight) hours as  needed for muscle spasms. 06/27/15   Charmayne Sheer Beers, PA-C  docusate sodium (COLACE) 100 MG capsule Take 2 capsules (200 mg total) by mouth 2 (two) times daily. 01/14/15   Sharman Cheek, MD  lisinopril (PRINIVIL,ZESTRIL) 40 MG tablet Take 40 mg by mouth daily.    Historical Provider, MD  metoprolol (LOPRESSOR) 50 MG tablet Take 50 mg by mouth 2 (two) times daily.    Historical Provider, MD  naproxen (NAPROSYN) 500 MG tablet Take 1 tablet (500 mg total) by mouth 2 (two) times daily with a meal. Patient not taking: Reported on 09/23/2015 12/16/14   Jene Every, MD  naproxen (NAPROSYN) 500 MG tablet Take 1 tablet (500 mg total) by mouth 2 (two) times daily with a meal. 06/24/15   Joni Reining, PA-C  pramoxine (PROCTOFOAM) 1 % foam Place 1 application rectally 3 (three) times daily as needed for itching. Patient not taking: Reported on 09/23/2015 01/14/15   Sharman Cheek, MD  pravastatin (PRAVACHOL) 20 MG tablet Take 20 mg by mouth daily.    Historical Provider, MD  predniSONE (DELTASONE) 10 MG tablet Take 5 tablets (50 mg total) by mouth daily with breakfast. Patient not taking: Reported on 09/23/2015 06/27/15   Charmayne Sheer Beers, PA-C  Witch Hazel (TUCKS) 50 % PADS Apply 1 application topically 3 (three) times daily as needed. Patient not taking: Reported on 09/23/2015 01/14/15   Sharman Cheek, MD      PHYSICAL EXAMINATION:   VITAL SIGNS: Blood pressure (!) 177/126, pulse 65, temperature 98.4 F (36.9 C), temperature source Oral, resp. rate 17, height 5\' 10"  (1.778 m), weight 72.6 kg (160 lb), SpO2 100 %.  GENERAL:  51 y.o.-year-old patient lying in the bed with no acute distress.  EYES: Pupils equal, round, reactive to light and accommodation. No scleral icterus. Extraocular muscles intact.  HEENT: Head atraumatic, normocephalic. Oropharynx and nasopharynx clear.  NECK:  Supple, no jugular venous distention. No thyroid enlargement, no tenderness.  LUNGS: Some diminished breath sounds  bilaterally, no wheezing, rales,rhonchi or crepitation. No use of accessory muscles of respiration.  CARDIOVASCULAR: S1, S2 . Rhythm was regular. No murmurs, rubs, or gallops.  ABDOMEN: Soft, nontender, nondistended. Bowel sounds present. No organomegaly or mass.  EXTREMITIES: No pedal edema, cyanosis, or clubbing.  NEUROLOGIC: Cranial nerves II through XII are intact. Muscle strength 5/5 in all extremities. Sensation intact. Gait not checked.  PSYCHIATRIC: The patient is alert and oriented x 3.  SKIN: No obvious rash, lesion, or ulcer.   LABORATORY PANEL:   CBC  Recent Labs Lab 07/01/16 1440  WBC 6.7  HGB 14.7  HCT 41.6  PLT 226  MCV 96.1  MCH 33.9  MCHC 35.2  RDW 13.3   ------------------------------------------------------------------------------------------------------------------  Chemistries   Recent Labs Lab 07/01/16 1440 07/01/16 1748  NA 142  --   K 3.9  --   CL 109  --   CO2 26  --   GLUCOSE 92  --  BUN 9  --   CREATININE 0.62  --   CALCIUM 9.4  --   AST  --  22  ALT  --  13*  ALKPHOS  --  73  BILITOT  --  0.6   ------------------------------------------------------------------------------------------------------------------  Cardiac Enzymes  Recent Labs Lab 07/01/16 1440  TROPONINI <0.03   ------------------------------------------------------------------------------------------------------------------  RADIOLOGY: Dg Chest 2 View  Result Date: 07/01/2016 CLINICAL DATA:  Chest pain EXAM: CHEST  2 VIEW COMPARISON:  09/23/2015 FINDINGS: The heart size and mediastinal contours are within normal limits. Both lungs are clear. The visualized skeletal structures are unremarkable. IMPRESSION: No active cardiopulmonary disease. Electronically Signed   By: Marlan Palau M.D.   On: 07/01/2016 15:34    EKG: Orders placed or performed during the hospital encounter of 07/01/16  . EKG 12-Lead  . EKG 12-Lead  . ED EKG within 10 minutes  . ED EKG within 10  minutes  EKG in the emergency room reveals sinus rhythm at 64 bpm, left atrial enlargement per EKG criteria LVH, ST-T wave depressions in inferior and lateral leads, no acute ST-T changes  IMPRESSION AND PLAN:  Principal Problem:   Chest pain Active Problems:   Malignant essential hypertension  #1. Chest pain, admit patient to medical floor, restart him on aspirin, Plavix, metoprolol,  nitroglycerin topically, check cardiac enzymes 3, get cardiologist involved for recommendations, get Myoview stress test in the morning #2, malignant essential hypertension, resume outpatient medications, patient has been off medications for at least 1 month, get care management involved for medication management, get urine drug screen, as patient is known to have abused cocaine in the past #3. Headache, likely hypertensive encephalopathy, follow with blood pressure improvement #4. Tobacco abuse. Counseling, discussed this patient for 4 minutes, nicotine replacement therapy will be initiated, patient is agreeable #5. Cocaine abuse, get urine drug screen to rule out cocaine in the system  All the records are reviewed and case discussed with ED provider. Management plans discussed with the patient, family and they are in agreement.  CODE STATUS: Code Status History    This patient does not have a recorded code status. Please follow your organizational policy for patients in this situation.       TOTAL TIME TAKING CARE OF THIS PATIENT: 50 minutes.    Katharina Caper M.D on 07/01/2016 at 7:17 PM  Between 7am to 6pm - Pager - [phone] After 6pm go to [email or web address] - password [email or web address] [facility]  [facility]  Office  [phone]  CC: Primary care physician; No PCP Per Patient

## 2016-07-01 NOTE — ED Triage Notes (Signed)
Chest pain, states feels like he has to burp "like I have bubbles in there".

## 2016-07-01 NOTE — ED Notes (Signed)
Patient here for CP he experienced earlier today. Patient states, "I think it may be acid reflux because I feel fine now". MI with Stents placed in 2013. Patient denies pain at this time.

## 2016-07-01 NOTE — ED Provider Notes (Addendum)
[facility] Emergency Department Provider Note  ____________________________________________   I have reviewed the triage vital signs and the nursing notes.   HISTORY  Chief Complaint Chest Pain    HPI Kenneth Morales is a 51 y.o. male strip bare-metal stent in 2013 to the circumflex presents today with substernal chest pressure. Denies any fever or chills or cough. States he was very short of breath with it. Lasted for about 45 minutes and gradually went away. He had to stop and he is doing. Does not feel like it was exactly the same as his prior heart attack. Denies leg swelling or shortness of breath. Mask and with family out of the room he specifically denies cocaine. Patient states he is not taking any of his medication and hasn't for "a while". Pain was a pressure-like sensation. Nonradiating. Nothing made it better aside from sitting down and nothing made it worse     Past Medical History:  Diagnosis Date  . Acute MI MAY 2013  . CAD (coronary artery disease)    NSTEMI in 05/13 in setting of Cocaine use. Cath: 99% mid LCX stenosis with a large thrombus. PCI and BMS (4.0 X15 mm Vision) placement to mid LCX, LAD: 20%, RCA: 30%, EF: 60%.   . Cocaine abuse    quit in 08/2011  . Depression   . Hyperlipidemia   . Hypertension   . Spider bite   . Tobacco abuse     Patient Active Problem List   Diagnosis Date Noted  . SOB (shortness of breath) 12/10/2011  . CAD (coronary artery disease)   . Hyperlipidemia   . Hypertension   . Tobacco abuse   . Cocaine abuse     Past Surgical History:  Procedure Laterality Date  . CARDIAC CATHETERIZATION  MAY 2013   s/p stent @ [facility]  . CARDIAC CATHETERIZATION    . HEMORRHOID SURGERY      Prior to Admission medications   Medication Sig Start Date End Date Taking? Authorizing Provider  aspirin 81 MG tablet Take 81 mg by mouth daily.    Historical Provider, MD  clopidogrel (PLAVIX) 75 MG tablet Take 75 mg by  mouth daily.    Historical Provider, MD  cyclobenzaprine (FLEXERIL) 10 MG tablet Take 1 tablet (10 mg total) by mouth every 8 (eight) hours as needed for muscle spasms. 06/27/15   Charmayne Sheerharles M Beers, PA-C  docusate sodium (COLACE) 100 MG capsule Take 2 capsules (200 mg total) by mouth 2 (two) times daily. 01/14/15   Sharman CheekPhillip Stafford, MD  lisinopril (PRINIVIL,ZESTRIL) 40 MG tablet Take 40 mg by mouth daily.    Historical Provider, MD  metoprolol (LOPRESSOR) 50 MG tablet Take 50 mg by mouth 2 (two) times daily.    Historical Provider, MD  naproxen (NAPROSYN) 500 MG tablet Take 1 tablet (500 mg total) by mouth 2 (two) times daily with a meal. Patient not taking: Reported on 09/23/2015 12/16/14   Jene Everyobert Kinner, MD  naproxen (NAPROSYN) 500 MG tablet Take 1 tablet (500 mg total) by mouth 2 (two) times daily with a meal. 06/24/15   Joni Reiningonald K Smith, PA-C  pramoxine (PROCTOFOAM) 1 % foam Place 1 application rectally 3 (three) times daily as needed for itching. Patient not taking: Reported on 09/23/2015 01/14/15   Sharman CheekPhillip Stafford, MD  pravastatin (PRAVACHOL) 20 MG tablet Take 20 mg by mouth daily.    Historical Provider, MD  predniSONE (DELTASONE) 10 MG tablet Take 5 tablets (50 mg total) by mouth daily  with breakfast. Patient not taking: Reported on 09/23/2015 06/27/15   Charmayne Sheer Beers, PA-C  Witch Hazel (TUCKS) 50 % PADS Apply 1 application topically 3 (three) times daily as needed. Patient not taking: Reported on 09/23/2015 01/14/15   Sharman Cheek, MD    Allergies Penicillins  Family History  Problem Relation Age of Onset  . Heart attack Maternal Grandfather   . Heart attack Paternal Grandfather     Social History Social History  Substance Use Topics  . Smoking status: Current Every Day Smoker    Packs/day: 0.50    Years: 30.00    Types: Cigarettes  . Smokeless tobacco: Never Used  . Alcohol use 0.0 oz/week    1 - 2 Cans of beer per week     Comment: weekly    Review of Systems Constitutional:  No fever/chills Eyes: No visual changes. ENT: No sore throat. No stiff neck no neck pain Cardiovascular: Positive chest pain. Respiratory: Positive shortness of breath. Gastrointestinal:   no vomiting.  No diarrhea.  No constipation. Genitourinary: Negative for dysuria. Musculoskeletal: Negative lower extremity swelling Skin: Negative for rash. Neurological: Negative for severe headaches, focal weakness or numbness. 10-point ROS otherwise negative.  ____________________________________________   PHYSICAL EXAM:  VITAL SIGNS: ED Triage Vitals  Enc Vitals Group     BP 07/01/16 1438 (!) 179/115     Pulse Rate 07/01/16 1436 65     Resp 07/01/16 1436 16     Temp 07/01/16 1436 98.4 F (36.9 C)     Temp Source 07/01/16 1436 Oral     SpO2 07/01/16 1436 99 %     Weight 07/01/16 1434 160 lb (72.6 kg)     Height 07/01/16 1434 5\' 10"  (1.778 m)     Head Circumference --      Peak Flow --      Pain Score 07/01/16 1740 2     Pain Loc --      Pain Edu? --      Excl. in GC? --     Constitutional: Alert and oriented. Well appearing and in no acute distress. Eyes: Conjunctivae are normal. PERRL. EOMI. Head: Atraumatic. Nose: No congestion/rhinnorhea. Mouth/Throat: Mucous membranes are moist.  Oropharynx non-erythematous. Neck: No stridor.   Nontender with no meningismus Cardiovascular: Normal rate, regular rhythm. Grossly normal heart sounds.  Good peripheral circulation. Respiratory: Normal respiratory effort.  No retractions. Lungs CTAB. Abdominal: Soft and nontender. No distention. No guarding no rebound Back:  There is no focal tenderness or step off.  there is no midline tenderness there are no lesions noted. there is no CVA tenderness Musculoskeletal: No lower extremity tenderness, no upper extremity tenderness. No joint effusions, no DVT signs strong distal pulses no edema Neurologic:  Normal speech and language. No gross focal neurologic deficits are appreciated.  Skin:  Skin is  warm, dry and intact. No rash noted. Psychiatric: Mood and affect are normal. Speech and behavior are normal.  ____________________________________________   LABS (all labs ordered are listed, but only abnormal results are displayed)  Labs Reviewed  CBC - Abnormal; Notable for the following:       Result Value   RBC 4.33 (*)    All other components within normal limits  HEPATIC FUNCTION PANEL - Abnormal; Notable for the following:    ALT 13 (*)    Bilirubin, Direct <0.1 (*)    All other components within normal limits  BASIC METABOLIC PANEL  TROPONIN I  LIPASE, BLOOD  PROTIME-INR  APTT  URINE DRUG SCREEN, QUALITATIVE ([facility] ONLY)   ____________________________________________  EKG  I personally interpreted any EKGs ordered by me or triage  Sinus rhythm at 64 bpm no acute ST elevation, no acute ST depression flipped T waves noted in V4 5 and 6 which is similar to prior EKG  Repeat EKG sinus rhythm, no acute change in his repolarization abnormality, ongoing flipped T waves seen on prior EKGs. ____________________________________________  RADIOLOGY  I reviewed any imaging ordered by me or triage that were performed during my shift and, if possible, patient and/or family made aware of any abnormal findings. ____________________________________________   PROCEDURES  Procedure(s) performed: None  Procedures  Critical Care performed: CRITICAL CARE Performed by: Jeanmarie Plant   Total critical care time: 42 minutes  Critical care time was exclusive of separately billable procedures and treating other patients.  Critical care was necessary to treat or prevent imminent or life-threatening deterioration.  Critical care was time spent personally by me on the following activities: development of treatment plan with patient and/or surrogate as well as nursing, discussions with consultants, evaluation of patient's response to treatment, examination of patient, obtaining  history from patient or surrogate, ordering and performing treatments and interventions, ordering and review of laboratory studies, ordering and review of radiographic studies, pulse oximetry and re-evaluation of patient's condition.   ____________________________________________   INITIAL IMPRESSION / ASSESSMENT AND PLAN / ED COURSE  Pertinent labs & imaging results that were available during my care of the patient were reviewed by me and considered in my medical decision making (see chart for details).  Patient with chest pain and shortness of breath now gone. We have given him nitroglycerin. Blood pressures were elevated. He states, with his family out of his room, that he is not taking cocaine. Therefore we will try labetalol. He does understand that if he is actually taking with cocaine a beta blocker could cause him harm. He is adamant that he has not had cocaine in months. He is asymptomatic at this time he is quite hypertensive. This could be his baseline because of his significant noncompliance issues. Nonetheless, I have discussed with his cardiologist, and they agree with admission, Dr. Welton Flakes. Discussed also with hospitalist and they agree and will admit   ----------------------------------------- 7:31 PM on 07/01/2016 -----------------------------------------  Patient's blood pressure remains quite elevated but he is not having any symptoms with this. Is unclear what his baseline is. Hospitalist requested another 20 mg of labetalol to see if he can bring it down which we will do. Patient likely had elevated blood pressure for many months as he has not been compliant but we will try to get into a more reasonable number given his chest pain today.  ----------------------------------------- 8:04 PM on 07/01/2016 -----------------------------------------  Pressure is 165/113, patient still has no chest pain has not had any since he's been here, repeat EKG documented above. Discussed  with hospitalist Dr. Seth Bake, who did want to give the lisinopril this time. Patient's pressure has gone down significantly but his diastolic is still over 100. They will give the oral lisinopril the hospitalist and he will go upstairs. Patient has no complaints     ____________________________________________   FINAL CLINICAL IMPRESSION(S) / ED DIAGNOSES  Final diagnoses:  None      This chart was dictated using voice recognition software.  Despite best efforts to proofread,  errors can occur which can change meaning.      Jeanmarie Plant, MD 07/01/16 1851    Rudy Jew  Alphonzo Lemmings, MD 07/01/16 1932    Jeanmarie Plant, MD 07/01/16 2005

## 2016-07-02 ENCOUNTER — Other Ambulatory Visit: Payer: Medicaid Other

## 2016-07-02 ENCOUNTER — Other Ambulatory Visit: Payer: Self-pay | Admitting: Radiology

## 2016-07-02 LAB — BASIC METABOLIC PANEL
Anion gap: 7 (ref 5–15)
BUN: 13 mg/dL (ref 6–20)
CO2: 26 mmol/L (ref 22–32)
Calcium: 8.9 mg/dL (ref 8.9–10.3)
Chloride: 107 mmol/L (ref 101–111)
Creatinine, Ser: 1.02 mg/dL (ref 0.61–1.24)
GFR calc Af Amer: >60 mL/min (ref >60)
GFR calc non Af Amer: >60 mL/min (ref >60)
Glucose, Bld: 95 mg/dL (ref 65–99)
Potassium: 3.2 mmol/L — ABNORMAL LOW (ref 3.5–5.1)
Sodium: 140 mmol/L (ref 135–145)

## 2016-07-02 LAB — CBC
HCT: 40.1 % (ref 40.0–52.0)
Hemoglobin: 14 g/dL (ref 13.0–18.0)
MCH: 33.3 pg (ref 26.0–34.0)
MCHC: 34.8 g/dL (ref 32.0–36.0)
MCV: 95.7 fL (ref 80.0–100.0)
Platelets: 226 10*3/uL (ref 150–440)
RBC: 4.19 MIL/uL — ABNORMAL LOW (ref 4.40–5.90)
RDW: 13.4 % (ref 11.5–14.5)
WBC: 7.8 10*3/uL (ref 3.8–10.6)

## 2016-07-02 LAB — TROPONIN I: Troponin I: <0.03 ng/mL (ref <0.03)

## 2016-07-02 MED ORDER — CLOPIDOGREL BISULFATE 75 MG PO TABS: 75.0000 mg | ORAL_TABLET | Freq: Every day | ORAL | 2 refills | Status: DC

## 2016-07-02 MED ORDER — PRAVASTATIN SODIUM 20 MG PO TABS: 20.0000 mg | ORAL_TABLET | Freq: Every day | ORAL | 2 refills | Status: DC

## 2016-07-02 MED ORDER — POTASSIUM CHLORIDE CRYS ER 20 MEQ PO TBCR
40.0000 meq | EXTENDED_RELEASE_TABLET | Freq: Once | ORAL | Status: AC
  Administered 2016-07-02: 40 meq via ORAL
  Filled 2016-07-02: qty 2

## 2016-07-02 MED ORDER — METOPROLOL TARTRATE 25 MG PO TABS: 25.0000 mg | ORAL_TABLET | Freq: Two times a day (BID) | ORAL | 2 refills | Status: DC

## 2016-07-02 MED ORDER — ASPIRIN 81 MG PO TABS: 81.0000 mg | ORAL_TABLET | Freq: Every day | ORAL | 2 refills | Status: DC

## 2016-07-02 MED ORDER — LISINOPRIL 40 MG PO TABS: 40.0000 mg | ORAL_TABLET | Freq: Every day | ORAL | 2 refills | Status: DC

## 2016-07-02 MED ORDER — ISOSORBIDE MONONITRATE ER 30 MG PO TB24: 30.0000 mg | ORAL_TABLET | Freq: Every day | ORAL | 2 refills | Status: DC

## 2016-07-02 NOTE — Progress Notes (Signed)
Kenneth Morales is a 51 y.o. male  [id number]  Primary Cardiologist: Adrian BlackwaterShaukat Ciel Chervenak  Reason for Consultation: Chest pain  HPI: This is a 51 year old African-American male with a past medical history of PCI and stenting in the past presented to the hospital with chest pain because he's been noncompliant with medication as he ran out of medicines because he had no insurance. He has no further chest pain is feeling much better.   Review of Systems: No orthopnea PND and and no further chest pain   Past Medical History:  Diagnosis Date  . Acute MI MAY 2013  . CAD (coronary artery disease)    NSTEMI in 05/13 in setting of Cocaine use. Cath: 99% mid LCX stenosis with a large thrombus. PCI and BMS (4.0 X15 mm Vision) placement to mid LCX, LAD: 20%, RCA: 30%, EF: 60%.   . Cocaine abuse    quit in 08/2011  . Depression   . Hyperlipidemia   . Hypertension   . Spider bite   . Tobacco abuse     Medications Prior to Admission  Medication Sig Dispense Refill  . acetaminophen (TYLENOL) 500 MG tablet Take 500 mg by mouth every 6 (six) hours as needed.    Marland Kitchen. aspirin 81 MG tablet Take 81 mg by mouth daily.    . clopidogrel (PLAVIX) 75 MG tablet Take 75 mg by mouth daily.    . cyclobenzaprine (FLEXERIL) 10 MG tablet Take 1 tablet (10 mg total) by mouth every 8 (eight) hours as needed for muscle spasms. 30 tablet 1  . docusate sodium (COLACE) 100 MG capsule Take 2 capsules (200 mg total) by mouth 2 (two) times daily. 120 capsule 3  . lisinopril (PRINIVIL,ZESTRIL) 40 MG tablet Take 40 mg by mouth daily.    . metoprolol (LOPRESSOR) 50 MG tablet Take 50 mg by mouth 2 (two) times daily.    . naproxen (NAPROSYN) 500 MG tablet Take 1 tablet (500 mg total) by mouth 2 (two) times daily with a meal. 20 tablet 0  . pravastatin (PRAVACHOL) 20 MG tablet Take 20 mg by mouth daily.       Marland Kitchen. aspirin EC  81 mg Oral Daily  . clopidogrel  75 mg Oral Daily  . enoxaparin (LOVENOX) injection  40 mg Subcutaneous Q24H   . Influenza vac split quadrivalent PF  0.5 mL Intramuscular Tomorrow-1000  . lisinopril  40 mg Oral Daily  . metoprolol  25 mg Oral BID  . nicotine  14 mg Transdermal Daily  . nitroGLYCERIN  1 inch Topical Q6H  . pneumococcal 23 valent vaccine  0.5 mL Intramuscular Tomorrow-1000  . pravastatin  20 mg Oral Daily  . sodium chloride flush  3 mL Intravenous Q12H    Infusions:   Allergies  Allergen Reactions  . Penicillins     rash    Social History   Social History  . Marital status: Single    Spouse name: N/A  . Number of children: N/A  . Years of education: N/A   Occupational History  . Not on file.   Social History Main Topics  . Smoking status: Current Every Day Smoker    Packs/day: 0.50    Years: 30.00    Types: Cigarettes  . Smokeless tobacco: Never Used  . Alcohol use 0.0 oz/week    1 - 2 Cans of beer per week     Comment: weekly  . Drug use: No     Comment: used cocaine 15-20 years  .  Sexual activity: Yes    Birth control/ protection: Other-see comments   Other Topics Concern  . Not on file   Social History Narrative  . No narrative on file    Family History  Problem Relation Age of Onset  . Heart attack Maternal Grandfather   . Heart attack Paternal Grandfather     PHYSICAL EXAM: Vitals:   07/02/16 0440 07/02/16 0713  BP: 127/88 (!) 140/95  Pulse: 63 61  Resp: 14 16  Temp: 98 F (36.7 C) 97.8 F (36.6 C)    No intake or output data in the 24 hours ending 07/02/16 0827  General:  Well appearing. No respiratory difficulty HEENT: normal Neck: supple. no JVD. Carotids 2+ bilat; no bruits. No lymphadenopathy or thryomegaly appreciated. Cor: PMI nondisplaced. Regular rate & rhythm. No rubs, gallops or murmurs. Lungs: clear Abdomen: soft, nontender, nondistended. No hepatosplenomegaly. No bruits or masses. Good bowel sounds. Extremities: no cyanosis, clubbing, rash, edema Neuro: alert & oriented x 3, cranial nerves grossly intact. moves all  4 extremities w/o difficulty. Affect pleasant.  ECG: Sinus rhythm with T-wave inversion in in the lateral leads which are unchanged from 2070 daily.  Results for orders placed or performed during the hospital encounter of 07/01/16 (from the past 24 hour(s))  Basic metabolic panel     Status: None   Collection Time: 07/01/16  2:40 PM  Result Value Ref Range   Sodium 142 135 - 145 mmol/L   Potassium 3.9 3.5 - 5.1 mmol/L   Chloride 109 101 - 111 mmol/L   CO2 26 22 - 32 mmol/L   Glucose, Bld 92 65 - 99 mg/dL   BUN 9 6 - 20 mg/dL   Creatinine, Ser 9.52 0.61 - 1.24 mg/dL   Calcium 9.4 8.9 - 84.1 mg/dL   GFR calc non Af Amer >60 >60 mL/min   GFR calc Af Amer >60 >60 mL/min   Anion gap 7 5 - 15  CBC     Status: Abnormal   Collection Time: 07/01/16  2:40 PM  Result Value Ref Range   WBC 6.7 3.8 - 10.6 K/uL   RBC 4.33 (L) 4.40 - 5.90 MIL/uL   Hemoglobin 14.7 13.0 - 18.0 g/dL   HCT 32.4 40.1 - 02.7 %   MCV 96.1 80.0 - 100.0 fL   MCH 33.9 26.0 - 34.0 pg   MCHC 35.2 32.0 - 36.0 g/dL   RDW 25.3 66.4 - 40.3 %   Platelets 226 150 - 440 K/uL  Troponin I     Status: None   Collection Time: 07/01/16  2:40 PM  Result Value Ref Range   Troponin I <0.03 <0.03 ng/mL  Hepatic function panel     Status: Abnormal   Collection Time: 07/01/16  5:48 PM  Result Value Ref Range   Total Protein 7.5 6.5 - 8.1 g/dL   Albumin 4.2 3.5 - 5.0 g/dL   AST 22 15 - 41 U/L   ALT 13 (L) 17 - 63 U/L   Alkaline Phosphatase 73 38 - 126 U/L   Total Bilirubin 0.6 0.3 - 1.2 mg/dL   Bilirubin, Direct <4.7 (L) 0.1 - 0.5 mg/dL   Indirect Bilirubin NOT CALCULATED 0.3 - 0.9 mg/dL  Lipase, blood     Status: None   Collection Time: 07/01/16  5:48 PM  Result Value Ref Range   Lipase 12 11 - 51 U/L  Protime-INR     Status: None   Collection Time: 07/01/16  5:48 PM  Result Value Ref Range   Prothrombin Time 12.6 11.4 - 15.2 seconds   INR 0.94   APTT     Status: None   Collection Time: 07/01/16  5:48 PM  Result Value  Ref Range   aPTT 30 24 - 36 seconds  Urine Drug Screen, Qualitative     Status: None   Collection Time: 07/01/16  8:39 PM  Result Value Ref Range   Tricyclic, Ur Screen NONE DETECTED NONE DETECTED   Amphetamines, Ur Screen NONE DETECTED NONE DETECTED   MDMA (Ecstasy)Ur Screen NONE DETECTED NONE DETECTED   Cocaine Metabolite,Ur [place] NONE DETECTED NONE DETECTED   Opiate, Ur Screen NONE DETECTED NONE DETECTED   Phencyclidine (PCP) Ur S NONE DETECTED NONE DETECTED   Cannabinoid 50 Ng, Ur [place] NONE DETECTED NONE DETECTED   Barbiturates, Ur Screen NONE DETECTED NONE DETECTED   Benzodiazepine, Ur Scrn NONE DETECTED NONE DETECTED   Methadone Scn, Ur NONE DETECTED NONE DETECTED  Troponin I     Status: None   Collection Time: 07/01/16  9:55 PM  Result Value Ref Range   Troponin I <0.03 <0.03 ng/mL  TSH     Status: None   Collection Time: 07/01/16  9:55 PM  Result Value Ref Range   TSH 2.882 0.350 - 4.500 uIU/mL  Troponin I     Status: None   Collection Time: 07/02/16  2:50 AM  Result Value Ref Range   Troponin I <0.03 <0.03 ng/mL  Basic metabolic panel     Status: Abnormal   Collection Time: 07/02/16  2:50 AM  Result Value Ref Range   Sodium 140 135 - 145 mmol/L   Potassium 3.2 (L) 3.5 - 5.1 mmol/L   Chloride 107 101 - 111 mmol/L   CO2 26 22 - 32 mmol/L   Glucose, Bld 95 65 - 99 mg/dL   BUN 13 6 - 20 mg/dL   Creatinine, Ser 1.61 0.61 - 1.24 mg/dL   Calcium 8.9 8.9 - 09.6 mg/dL   GFR calc non Af Amer >60 >60 mL/min   GFR calc Af Amer >60 >60 mL/min   Anion gap 7 5 - 15  CBC     Status: Abnormal   Collection Time: 07/02/16  2:50 AM  Result Value Ref Range   WBC 7.8 3.8 - 10.6 K/uL   RBC 4.19 (L) 4.40 - 5.90 MIL/uL   Hemoglobin 14.0 13.0 - 18.0 g/dL   HCT 04.5 40.9 - 81.1 %   MCV 95.7 80.0 - 100.0 fL   MCH 33.3 26.0 - 34.0 pg   MCHC 34.8 32.0 - 36.0 g/dL   RDW 91.4 78.2 - 95.6 %   Platelets 226 150 - 440 K/uL   Dg Chest 2 View  Result Date: 07/01/2016 CLINICAL DATA:  Chest pain  EXAM: CHEST  2 VIEW COMPARISON:  09/23/2015 FINDINGS: The heart size and mediastinal contours are within normal limits. Both lungs are clear. The visualized skeletal structures are unremarkable. IMPRESSION: No active cardiopulmonary disease. Electronically Signed   By: Marlan Palau M.D.   On: 07/01/2016 15:34     ASSESSMENT AND PLAN: Atypical chest pain with MI being ruled out and EKG unchanged from 2017. Patient was started back on aspirin and Plavix nitrates and lisinopril and is feeling much better and blood pressure is much better. May go home and will do outpatient CTA coronaries in the office to evaluate chest pain. Advise not stressing him.  Leatha Rohner A

## 2016-07-02 NOTE — Progress Notes (Signed)
Notified by Dr. Park BreedKahn that stress test should be cancelled and patient be discharged on current medications. Nuclear med made aware. Dr. Nemiah CommanderKalisetti made aware, en route to see patient.

## 2016-07-02 NOTE — Discharge Summary (Signed)
[facility] [place] [facility]   PATIENT NAME: Kenneth Morales    MR#:  [id number]  DATE OF BIRTH:  July 18, 1965  DATE OF ADMISSION:  07/01/2016   ADMITTING PHYSICIAN: Altamese Dilling, MD  DATE OF DISCHARGE: 07/02/2016 11:19 AM  PRIMARY CARE PHYSICIAN: No PCP Per Patient   ADMISSION DIAGNOSIS:   Noncompliance [Z91.19] Chest pain [R07.9] Chest pain, unspecified type [R07.9] Hypertension, unspecified type [I10]  DISCHARGE DIAGNOSIS:   Principal Problem:   Chest pain Active Problems:   Malignant essential hypertension   SECONDARY DIAGNOSIS:   Past Medical History:  Diagnosis Date  . Acute MI MAY 2013  . CAD (coronary artery disease)    NSTEMI in 05/13 in setting of Cocaine use. Cath: 99% mid LCX stenosis with a large thrombus. PCI and BMS (4.0 X15 mm Vision) placement to mid LCX, LAD: 20%, RCA: 30%, EF: 60%.   . Cocaine abuse    quit in 08/2011  . Depression   . Hyperlipidemia   . Hypertension   . Spider bite   . Tobacco abuse     HOSPITAL COURSE:   51 year old male with past medical history significant for coronary artery disease status post left circumflex PCI in 2013, hypertension, hyperlipidemia, ongoing smoking who has not been taking his medications for several months now presents to the hospital secondary to chest pain.   #1 chest pain-secondary to stable angina from elevated blood pressure. -Once his cardiac medications were restarted, his blood pressure has improved and his chest pain has resolved. -He'll follow up with cardiology as outpatient for either stress test or CT angiogram  #2 coronary reactive disease-started on aspirin, Plavix, lisinopril, statin. Imdur has been added. Also on metoprolol.  #3 hypertensive urgency-will be discharged on metoprolol, lisinopril and Imdur  #4 tobacco use disorder-strongly counseled against smoking at discharge.  Will be discharged today.   DISCHARGE CONDITIONS:   Stable  CONSULTS  OBTAINED:   Treatment Team:  Laurier Nancy, MD  DRUG ALLERGIES:   Allergies  Allergen Reactions  . Penicillins     rash   DISCHARGE MEDICATIONS:   Allergies as of 07/02/2016      Reactions   Penicillins    rash      Medication List    STOP taking these medications   cyclobenzaprine 10 MG tablet Commonly known as:  FLEXERIL   naproxen 500 MG tablet Commonly known as:  NAPROSYN     TAKE these medications   acetaminophen 500 MG tablet Commonly known as:  TYLENOL Take 500 mg by mouth every 6 (six) hours as needed.   aspirin 81 MG tablet Take 1 tablet (81 mg total) by mouth daily.   clopidogrel 75 MG tablet Commonly known as:  PLAVIX Take 1 tablet (75 mg total) by mouth daily.   docusate sodium 100 MG capsule Commonly known as:  COLACE Take 2 capsules (200 mg total) by mouth 2 (two) times daily.   isosorbide mononitrate 30 MG 24 hr tablet Commonly known as:  IMDUR Take 1 tablet (30 mg total) by mouth daily.   lisinopril 40 MG tablet Commonly known as:  PRINIVIL,ZESTRIL Take 1 tablet (40 mg total) by mouth daily.   metoprolol tartrate 25 MG tablet Commonly known as:  LOPRESSOR Take 1 tablet (25 mg total) by mouth 2 (two) times daily. What changed:  medication strength  how much to take   pravastatin 20 MG tablet Commonly known as:  PRAVACHOL Take 1 tablet (20 mg total) by  mouth daily.        DISCHARGE INSTRUCTIONS:   1. F/u with cardiology in 1 week 2. PCP f/u in 1-2 weeks  DIET:   Cardiac diet  ACTIVITY:   Activity as tolerated  OXYGEN:   Home Oxygen: No.  Oxygen Delivery: room air  DISCHARGE LOCATION:   home   If you experience worsening of your admission symptoms, develop shortness of breath, life threatening emergency, suicidal or homicidal thoughts you must seek medical attention immediately by calling 911 or calling your MD immediately  if symptoms less severe.  You Must read complete instructions/literature along with all  the possible adverse reactions/side effects for all the Medicines you take and that have been prescribed to you. Take any new Medicines after you have completely understood and accpet all the possible adverse reactions/side effects.   Please note  You were cared for by a hospitalist during your hospital stay. If you have any questions about your discharge medications or the care you received while you were in the hospital after you are discharged, you can call the unit and asked to speak with the hospitalist on call if the hospitalist that took care of you is not available. Once you are discharged, your primary care physician will handle any further medical issues. Please note that NO REFILLS for any discharge medications will be authorized once you are discharged, as it is imperative that you return to your primary care physician (or establish a relationship with a primary care physician if you do not have one) for your aftercare needs so that they can reassess your need for medications and monitor your lab values.    On the day of Discharge:  VITAL SIGNS:   Blood pressure (!) 140/93, pulse 67, temperature 97.8 F (36.6 C), temperature source Oral, resp. rate 16, height 5\' 10"  (1.778 m), weight 74 kg (163 lb 1.6 oz), SpO2 97 %.  PHYSICAL EXAMINATION:    GENERAL:  51 y.o.-year-old patient lying in the bed with no acute distress.  EYES: Pupils equal, round, reactive to light and accommodation. No scleral icterus. Extraocular muscles intact.  HEENT: Head atraumatic, normocephalic. Oropharynx and nasopharynx clear.  NECK:  Supple, no jugular venous distention. No thyroid enlargement, no tenderness.  LUNGS: Normal breath sounds bilaterally, no wheezing, rales,rhonchi or crepitation. No use of accessory muscles of respiration.  CARDIOVASCULAR: S1, S2 normal. No murmurs, rubs, or gallops.  ABDOMEN: Soft, non-tender, non-distended. Bowel sounds present. No organomegaly or mass.  EXTREMITIES: No  pedal edema, cyanosis, or clubbing.  NEUROLOGIC: Cranial nerves II through XII are intact. Muscle strength 5/5 in all extremities. Sensation intact. Gait not checked.  PSYCHIATRIC: The patient is alert and oriented x 3.  SKIN: No obvious rash, lesion, or ulcer.   DATA REVIEW:   CBC  Recent Labs Lab 07/02/16 0250  WBC 7.8  HGB 14.0  HCT 40.1  PLT 226    Chemistries   Recent Labs Lab 07/01/16 1748 07/02/16 0250  NA  --  140  K  --  3.2*  CL  --  107  CO2  --  26  GLUCOSE  --  95  BUN  --  13  CREATININE  --  1.02  CALCIUM  --  8.9  AST 22  --   ALT 13*  --   ALKPHOS 73  --   BILITOT 0.6  --      Microbiology Results  No results found for this or any previous visit.  RADIOLOGY:  Dg Chest 2 View  Result Date: 07/01/2016 CLINICAL DATA:  Chest pain EXAM: CHEST  2 VIEW COMPARISON:  09/23/2015 FINDINGS: The heart size and mediastinal contours are within normal limits. Both lungs are clear. The visualized skeletal structures are unremarkable. IMPRESSION: No active cardiopulmonary disease. Electronically Signed   By: Marlan Palauharles  Clark M.D.   On: 07/01/2016 15:34     Management plans discussed with the patient, family and they are in agreement.  CODE STATUS:  Code Status History    Date Active Date Inactive Code Status Order ID Comments User Context   07/01/2016  9:04 PM 07/02/2016  2:19 PM Full Code [id number]  Katharina Caperima Vaickute, MD Inpatient      TOTAL TIME TAKING CARE OF THIS PATIENT: 38 minutes.    Enid BaasKALISETTI,Lovis More M.D on 07/02/2016 at 2:33 PM  Between 7am to 6pm - Pager - [phone]  After 6pm Morales to [email or web address] - Scientist, research (life sciences)password [email or web address]  [facility] [place] [facility]  Office  [phone]  CC: Primary care physician; No PCP Per Patient   Note: This dictation was prepared with Dragon dictation along with smaller phrase technology. Any transcriptional errors that result from this process are unintentional.

## 2016-07-02 NOTE — Progress Notes (Signed)
Discharge paperwork reviewed with patient. Information regarding follow-up appointments, medications and education for all newly prescribed meds was provided, all questions answered. Peripheral IV removed, catheter intact. Heart monitor removed, returned to the nurse station. Flu vaccine given in L arm, pneumonia vaccine given in R arm. Transport requested via wheelchair to the lobby for discharge.

## 2016-07-02 NOTE — Progress Notes (Signed)
     Kenneth Morales was admitted to the [facility] on 07/01/2016 for an acute medical condition and is being Discharged on  07/02/2016 . He will need another 2days for recovery and so advised to stay away from work until then. So please excuse him from work for the above Days. Should be able to return to work without any restrictions from 07/05/16.  Call Enid Baasadhika Lelan Cush  MD, [facility] at  [phone] with questions.  Enid BaasKALISETTI,Glennis Borger M.D on 07/02/2016,at 10:12 AM  [facility] [address], [place] [id number]

## 2016-07-03 LAB — HEMOGLOBIN A1C
Hgb A1c MFr Bld: 5.4 % (ref 4.8–5.6)
Mean Plasma Glucose: 108 mg/dL

## 2016-07-03 LAB — HIV ANTIBODY (ROUTINE TESTING W REFLEX): HIV Screen 4th Generation wRfx: NONREACTIVE

## 2017-02-22 ENCOUNTER — Emergency Department: Payer: Medicaid Other

## 2017-02-22 ENCOUNTER — Emergency Department
Admission: EM | Admit: 2017-02-22 | Discharge: 2017-02-23 | Disposition: A | Discharge status: Home / Self Care | Payer: Medicaid Other | Attending: Emergency Medicine | Admitting: Emergency Medicine

## 2017-02-22 ENCOUNTER — Encounter: Payer: Self-pay | Admitting: Emergency Medicine

## 2017-02-22 DIAGNOSIS — Z955 Presence of coronary angioplasty implant and graft: Secondary | ICD-10-CM | POA: Diagnosis not present

## 2017-02-22 DIAGNOSIS — I2510 Atherosclerotic heart disease of native coronary artery without angina pectoris: Secondary | ICD-10-CM | POA: Diagnosis not present

## 2017-02-22 DIAGNOSIS — R0789 Other chest pain: Secondary | ICD-10-CM | POA: Diagnosis not present

## 2017-02-22 DIAGNOSIS — F1490 Cocaine use, unspecified, uncomplicated: Secondary | ICD-10-CM

## 2017-02-22 DIAGNOSIS — I252 Old myocardial infarction: Secondary | ICD-10-CM | POA: Insufficient documentation

## 2017-02-22 DIAGNOSIS — F17210 Nicotine dependence, cigarettes, uncomplicated: Secondary | ICD-10-CM | POA: Insufficient documentation

## 2017-02-22 DIAGNOSIS — R61 Generalized hyperhidrosis: Secondary | ICD-10-CM | POA: Diagnosis not present

## 2017-02-22 DIAGNOSIS — R079 Chest pain, unspecified: Secondary | ICD-10-CM

## 2017-02-22 DIAGNOSIS — I10 Essential (primary) hypertension: Secondary | ICD-10-CM | POA: Insufficient documentation

## 2017-02-22 DIAGNOSIS — R0602 Shortness of breath: Secondary | ICD-10-CM | POA: Insufficient documentation

## 2017-02-22 DIAGNOSIS — R42 Dizziness and giddiness: Secondary | ICD-10-CM | POA: Diagnosis not present

## 2017-02-22 LAB — CBC
HCT: 44.4 % (ref 40.0–52.0)
Hemoglobin: 15.4 g/dL (ref 13.0–18.0)
MCH: 33.7 pg (ref 26.0–34.0)
MCHC: 34.6 g/dL (ref 32.0–36.0)
MCV: 97.1 fL (ref 80.0–100.0)
Platelets: 281 10*3/uL (ref 150–440)
RBC: 4.57 MIL/uL (ref 4.40–5.90)
RDW: 12.9 % (ref 11.5–14.5)
WBC: 7.5 10*3/uL (ref 3.8–10.6)

## 2017-02-22 LAB — BASIC METABOLIC PANEL
Anion gap: 8 (ref 5–15)
BUN: 13 mg/dL (ref 6–20)
CO2: 24 mmol/L (ref 22–32)
Calcium: 9.5 mg/dL (ref 8.9–10.3)
Chloride: 106 mmol/L (ref 101–111)
Creatinine, Ser: 1.12 mg/dL (ref 0.61–1.24)
GFR calc Af Amer: >60 mL/min (ref >60)
GFR calc non Af Amer: >60 mL/min (ref >60)
Glucose, Bld: 90 mg/dL (ref 65–99)
Potassium: 4 mmol/L (ref 3.5–5.1)
Sodium: 138 mmol/L (ref 135–145)

## 2017-02-22 LAB — LIPASE, BLOOD: Lipase: 27 U/L (ref 11–51)

## 2017-02-22 LAB — TROPONIN I
Troponin I: <0.03 ng/mL (ref <0.03)
Troponin I: <0.03 ng/mL (ref <0.03)

## 2017-02-22 MED ORDER — IOPAMIDOL (ISOVUE-370) INJECTION 76%
100.0000 mL | Freq: Once | INTRAVENOUS | Status: AC | PRN
  Administered 2017-02-22: 100 mL via INTRAVENOUS

## 2017-02-22 NOTE — ED Triage Notes (Signed)
Pt via pov from home with chest pain today. States the pain is intermittent in his chest and between the shoulder blades. He states the pain hit at 1600 and again after arrival here. Pt has hx of MI and stents placed. Pt has had URI symptoms x 3 weeks as well. Pt alert & oriented with NAD noted.

## 2017-02-22 NOTE — Discharge Instructions (Signed)
Please do not use illicit drugs or abuse prescription drugs.  Please make an appointment with your cardiologist.  Return to the emergency department if you develop chest pain, shortness of breath, lightheadedness or fainting, palpitations, or any other symptoms concerning to you.

## 2017-02-22 NOTE — ED Notes (Signed)
Patient transported to CT 

## 2017-02-22 NOTE — ED Provider Notes (Signed)
[facility] Emergency Department Provider Note  ____________________________________________  Time seen: Approximately 9:35 PM  I have reviewed the triage vital signs and the nursing notes.   HISTORY  Chief Complaint Chest Pain    HPI Kenneth Morales is a 51 y.o. male with a history of CAD status post MI and multiple stents, cocaine abuse, hypertension and hyperlipidemia presenting with chest pain. The patient reports that for the past 2 weeks, he has been having intermittent chest pain described as a "tightness" in the center of the chest. He has no associated diaphoresis, nausea or vomiting, palpitations, shortness of breath, lightheadedness or syncope. The pain is not associated with exertion and can happen at rest. He notices it most when he lays down at night. He has no associated burping and it is not food related. Today, the patient was at home sitting at rest when he developed the chest tightness but additionally sharp pains that went from the center of the chest to between his shoulder blades. The patient states that he has not been taking his aspirin as prescribed. At this time, the patient is completely symptom-free.  Last stress test 1y ago - I do not have the results but he states they were reportedly negative.  No hx of cath.   Past Medical History:  Diagnosis Date  . Acute MI (HCC) MAY 2013  . CAD (coronary artery disease)    NSTEMI in 05/13 in setting of Cocaine use. Cath: 99% mid LCX stenosis with a large thrombus. PCI and BMS (4.0 X15 mm Vision) placement to mid LCX, LAD: 20%, RCA: 30%, EF: 60%.   . Cocaine abuse (HCC)    quit in 08/2011  . Depression   . Hyperlipidemia   . Hypertension   . Spider bite   . Tobacco abuse     Patient Active Problem List   Diagnosis Date Noted  . 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 Chest pain 07/01/2016  . SOB (shortness of breath) 12/10/2011  . CAD (coronary artery disease)   . Hyperlipidemia    . Hypertension   . Tobacco abuse   . Cocaine abuse [facility])     Past Surgical History:  Procedure Laterality Date  . CARDIAC CATHETERIZATION  MAY 2013   s/p stent @ [facility]  . CARDIAC CATHETERIZATION    . HEMORRHOID SURGERY      Current Outpatient Rx  . Order #: [id number] Class: Historical Med  . Order #: [id number] Class: Print  . Order #: [id number] Class: Print  . Order #: [id number] Class: Print  . Order #: [id number] Class: Print  . Order #: [id number] Class: Print  . Order #: [id number] Class: Print  . Order #: [id number] Class: Print    Allergies Penicillins  Family History  Problem Relation Age of Onset  . Heart attack Maternal Grandfather   . Heart attack Paternal Grandfather     Social History Social History  Substance Use Topics  . Smoking status: Current Every Day Smoker    Packs/day: 0.50    Years: 30.00    Types: Cigarettes  . Smokeless tobacco: Never Used  . Alcohol use 3.6 oz/week    6 Cans of beer per week     Comment: weekly    Review of Systems Constitutional: No fever/chills. Lightheadedness or syncope. No diaphoresis. Eyes: No visual changes. ENT: No sore throat. No congestion or rhinorrhea. Cardiovascular: Positive chest pain. Denies palpitations. Respiratory: Denies shortness of breath.  No cough. Gastrointestinal: No abdominal pain.  No nausea, no vomiting.  No diarrhea.  No constipation. Genitourinary: Negative for dysuria. Musculoskeletal: Negative for back pain. No lower extremity swelling or calf pain. Skin: Negative for rash. Neurological: Negative for headaches. No focal numbness, tingling or weakness.     ____________________________________________   PHYSICAL EXAM:  VITAL SIGNS: ED Triage Vitals  Enc Vitals Group     BP 02/22/17 1742 (!) 136/99     Pulse Rate 02/22/17 1742 76     Resp 02/22/17 1742 14     Temp 02/22/17 1742 99.2 F (37.3 C)     Temp Source 02/22/17 1742 Oral     SpO2 02/22/17 1742 98 %     Weight 02/22/17 1742  165 lb (74.8 kg)     Height 02/22/17 1742 5\' 10"  (1.778 m)     Head Circumference --      Peak Flow --      Pain Score 02/22/17 1750 10     Pain Loc --      Pain Edu? --      Excl. in GC? --     Constitutional: Alert and oriented. Well appearing and in no acute distress. Answers questions appropriately. Eyes: Conjunctivae are normal.  EOMI. No scleral icterus. Head: Atraumatic. Nose: No congestion/rhinnorhea. Mouth/Throat: Mucous membranes are moist.  Neck: No stridor.  Supple.  Positive JVD. No meningismus. Cardiovascular: Normal rate, regular rhythm. No murmurs, rubs or gallops.  Respiratory: Normal respiratory effort.  No accessory muscle use or retractions. Lungs CTAB.  No wheezes, rales or ronchi. Gastrointestinal: Soft, nontender and nondistended.  No guarding or rebound.  No peritoneal signs. Musculoskeletal: No LE edema. No ttp in the calves or palpable cords.  Negative Homan's sign. Neurologic:  A&Ox3.  Speech is clear.  Face and smile are symmetric.  EOMI.  Moves all extremities well. Skin:  Skin is warm, dry and intact. No rash noted. Psychiatric: Mood and affect are normal.  ____________________________________________   LABS (all labs ordered are listed, but only abnormal results are displayed)  Labs Reviewed  BASIC METABOLIC PANEL  CBC  TROPONIN I  TROPONIN I  LIPASE, BLOOD   ____________________________________________  EKG  ED ECG REPORT I, Rockne Menghini, the attending physician, personally viewed and interpreted this ECG.   Date: 02/22/2017  EKG Time: 1737  Rate: 78  Rhythm: normal sinus rhythm  Axis: normal  Intervals:none  ST&T Change: Patient has T-wave inversions in V4 V5 and V6, which are unchanged compared to 07/03/16. No ST elevation. No STEMI.  ____________________________________________  RADIOLOGY  Dg Chest 2 View  Result Date: 02/22/2017 CLINICAL DATA:  Chest pain today EXAM: CHEST  2 VIEW COMPARISON:  07/01/2016 FINDINGS:  The heart size and mediastinal contours are within normal limits. Both lungs are clear. No overt pulmonary edema. No pneumothorax. Minimal blunting of the right lateral costophrenic angle. The visualized skeletal structures are unremarkable. IMPRESSION: No active cardiopulmonary disease. Minimal blunting the right lateral costophrenic angle may be due to a tiny effusion or pleural thickening. Electronically Signed   By: Tollie Eth M.D.   On: 02/22/2017 18:33   Ct Angio Chest Pe W And/or Wo Contrast  Result Date: 02/22/2017 CLINICAL DATA:  Intermittent chest pain. EXAM: CT ANGIOGRAPHY CHEST WITH CONTRAST TECHNIQUE: Multidetector CT imaging of the chest was performed using the standard protocol during bolus administration of intravenous contrast. Multiplanar CT image reconstructions and MIPs were obtained to evaluate the vascular anatomy. CONTRAST:  100 cc Isovue 370 intravenously. COMPARISON:  Chest radiograph 02/22/2017 FINDINGS: Cardiovascular: Satisfactory opacification of the pulmonary arteries to  the segmental level. No evidence of pulmonary embolism. Normal heart size. No pericardial effusion. Left circumflex coronary artery stent noted. Mediastinum/Nodes: No enlarged mediastinal, hilar, or axillary lymph nodes. Thyroid gland, trachea, and esophagus demonstrate no significant findings. Lungs/Pleura: No evidence of focal consolidation, pleural effusion or pneumothorax. Mild chronic interstitial lung changes with peripheral predominance. Upper Abdomen: No acute abnormality. Musculoskeletal: No chest wall abnormality. No acute or significant osseous findings. Review of the MIP images confirms the above findings. IMPRESSION: No evidence of pulmonary embolus. Mild chronic interstitial lung changes. Electronically Signed   By: Ted Mcalpineobrinka  Dimitrova M.D.   On: 02/22/2017 22:43    ____________________________________________   PROCEDURES  Procedure(s) performed: None  Procedures  Critical Care  performed: No ____________________________________________   INITIAL IMPRESSION / ASSESSMENT AND PLAN / ED COURSE  Pertinent labs & imaging results that were available during my care of the patient were reviewed by me and considered in my medical decision making (see chart for details).  51 y.o. male with a history of CAD and stents who does not take his medications as prescribed, cocaine use, presenting with chest pain for the past 2 weeks. Overall, the patient is hypertensive on arrival. He states that he has had some cough or cold symptoms, but those symptoms have improved. Pneumonia is possible but less likely. I'm concerned about ACS or MI given his cocaine use, PE, or possibly aortic pathology given the pain that he has between her shoulder blades. We'll get a CT scan of the chest with contrast, and a repeat troponin. I anticipate admission.  I have reviewed the patient's chart.  ----------------------------------------- 11:14 PM on 02/22/2017 -----------------------------------------  The patient has been hemodynamically stable in the emergency department and has not had any further chest pain. The patient's troponin is negative 2. He states that his cocaine use is greater than 1 week ago. I've offered him admission for continued cardiac evaluation and research location city, but the patient prefers discharge at this time. He will follow-up with his primary cardiologist in the morning. Plan discharge at this time.  ____________________________________________  FINAL CLINICAL IMPRESSION(S) / ED DIAGNOSES  Final diagnoses:  Chest pain, unspecified type  Cocaine use         NEW MEDICATIONS STARTED DURING THIS VISIT:  New Prescriptions   No medications on file      Rockne MenghiniNorman, Anne-Caroline, MD 02/22/17 2315

## 2017-11-28 ENCOUNTER — Inpatient Hospital Stay
Admission: EM | Admit: 2017-11-28 | Discharge: 2017-11-30 | DRG: 281 | Disposition: A | Discharge status: Home / Self Care | Payer: Medicaid Other | Attending: Internal Medicine | Admitting: Internal Medicine

## 2017-11-28 ENCOUNTER — Emergency Department: Payer: Medicaid Other

## 2017-11-28 ENCOUNTER — Other Ambulatory Visit: Payer: Self-pay

## 2017-11-28 ENCOUNTER — Encounter: Payer: Self-pay | Admitting: Emergency Medicine

## 2017-11-28 DIAGNOSIS — I161 Hypertensive emergency: Secondary | ICD-10-CM | POA: Diagnosis present

## 2017-11-28 DIAGNOSIS — Z880 Allergy status to penicillin: Secondary | ICD-10-CM | POA: Diagnosis not present

## 2017-11-28 DIAGNOSIS — Z7902 Long term (current) use of antithrombotics/antiplatelets: Secondary | ICD-10-CM

## 2017-11-28 DIAGNOSIS — Y840 Cardiac catheterization as the cause of abnormal reaction of the patient, or of later complication, without mention of misadventure at the time of the procedure: Secondary | ICD-10-CM | POA: Diagnosis not present

## 2017-11-28 DIAGNOSIS — Z9114 Patient's other noncompliance with medication regimen: Secondary | ICD-10-CM

## 2017-11-28 DIAGNOSIS — Z79899 Other long term (current) drug therapy: Secondary | ICD-10-CM

## 2017-11-28 DIAGNOSIS — Z8249 Family history of ischemic heart disease and other diseases of the circulatory system: Secondary | ICD-10-CM | POA: Diagnosis not present

## 2017-11-28 DIAGNOSIS — I214 Non-ST elevation (NSTEMI) myocardial infarction: Principal | ICD-10-CM | POA: Diagnosis present

## 2017-11-28 DIAGNOSIS — Z7982 Long term (current) use of aspirin: Secondary | ICD-10-CM | POA: Diagnosis not present

## 2017-11-28 DIAGNOSIS — R0789 Other chest pain: Secondary | ICD-10-CM | POA: Diagnosis present

## 2017-11-28 DIAGNOSIS — Z91128 Patient's intentional underdosing of medication regimen for other reason: Secondary | ICD-10-CM

## 2017-11-28 DIAGNOSIS — F17210 Nicotine dependence, cigarettes, uncomplicated: Secondary | ICD-10-CM | POA: Diagnosis present

## 2017-11-28 DIAGNOSIS — I97630 Postprocedural hematoma of a circulatory system organ or structure following a cardiac catheterization: Secondary | ICD-10-CM | POA: Diagnosis not present

## 2017-11-28 DIAGNOSIS — I2510 Atherosclerotic heart disease of native coronary artery without angina pectoris: Secondary | ICD-10-CM | POA: Diagnosis present

## 2017-11-28 DIAGNOSIS — I10 Essential (primary) hypertension: Secondary | ICD-10-CM | POA: Diagnosis present

## 2017-11-28 DIAGNOSIS — E785 Hyperlipidemia, unspecified: Secondary | ICD-10-CM | POA: Diagnosis present

## 2017-11-28 DIAGNOSIS — I252 Old myocardial infarction: Secondary | ICD-10-CM | POA: Diagnosis not present

## 2017-11-28 DIAGNOSIS — Z955 Presence of coronary angioplasty implant and graft: Secondary | ICD-10-CM | POA: Diagnosis not present

## 2017-11-28 LAB — COMPREHENSIVE METABOLIC PANEL
ALT: 15 U/L (ref 0–44)
AST: 32 U/L (ref 15–41)
Albumin: 4 g/dL (ref 3.5–5.0)
Alkaline Phosphatase: 95 U/L (ref 38–126)
Anion gap: 8 (ref 5–15)
BUN: 9 mg/dL (ref 6–20)
CO2: 26 mmol/L (ref 22–32)
Calcium: 9.3 mg/dL (ref 8.9–10.3)
Chloride: 106 mmol/L (ref 98–111)
Creatinine, Ser: 0.88 mg/dL (ref 0.61–1.24)
GFR calc Af Amer: >60 mL/min (ref >60)
GFR calc non Af Amer: >60 mL/min (ref >60)
Glucose, Bld: 96 mg/dL (ref 70–99)
Potassium: 4.1 mmol/L (ref 3.5–5.1)
Sodium: 140 mmol/L (ref 135–145)
Total Bilirubin: 0.7 mg/dL (ref 0.3–1.2)
Total Protein: 7.1 g/dL (ref 6.5–8.1)

## 2017-11-28 LAB — CBC
HCT: 43.7 % (ref 40.0–52.0)
Hemoglobin: 15.5 g/dL (ref 13.0–18.0)
MCH: 34.3 pg — ABNORMAL HIGH (ref 26.0–34.0)
MCHC: 35.5 g/dL (ref 32.0–36.0)
MCV: 96.8 fL (ref 80.0–100.0)
Platelets: 229 10*3/uL (ref 150–440)
RBC: 4.51 MIL/uL (ref 4.40–5.90)
RDW: 13 % (ref 11.5–14.5)
WBC: 8.6 10*3/uL (ref 3.8–10.6)

## 2017-11-28 LAB — TROPONIN I: Troponin I: 1.05 ng/mL (ref <0.03)

## 2017-11-28 LAB — MAGNESIUM: Magnesium: 2.4 mg/dL (ref 1.7–2.4)

## 2017-11-28 LAB — APTT: aPTT: 31 seconds (ref 24–36)

## 2017-11-28 LAB — LIPASE, BLOOD: Lipase: 25 U/L (ref 11–51)

## 2017-11-28 LAB — PROTIME-INR
INR: 0.89
Prothrombin Time: 12 seconds (ref 11.4–15.2)

## 2017-11-28 IMAGING — CR DG CHEST 2V
1 series · 2 of 2 positions shown · non-contrast
Comparison: Chest x-ray [DATE].

CLINICAL DATA: 52-year-old male with central chest pain since [DATE]
p.m. today with some shortness of breath.

EXAM:
CHEST - 2 VIEW

[Series 1: dg chest 2 view · 0.14mm/px · 2 of 2 slices shown]
[im 1/2]
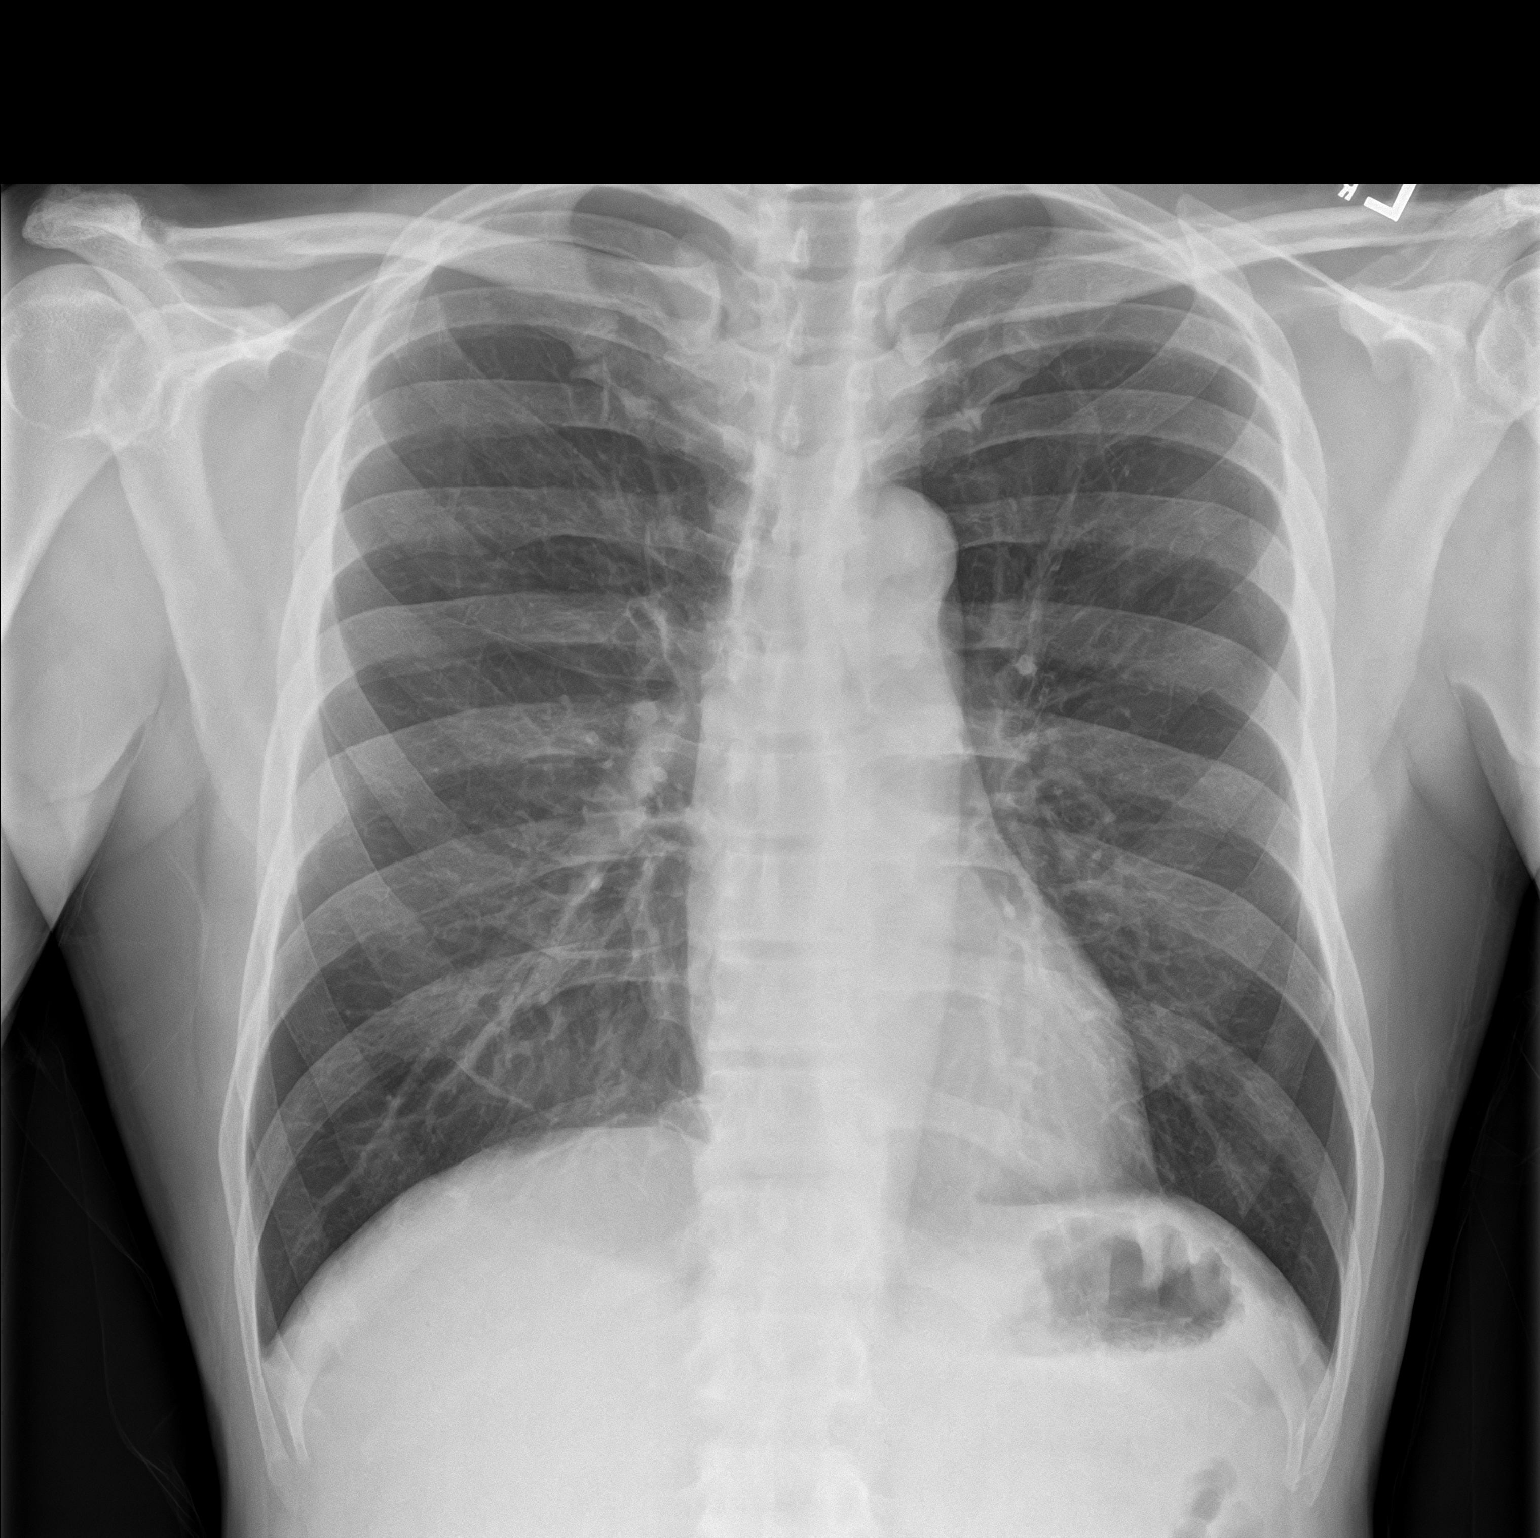
[im 2/2]
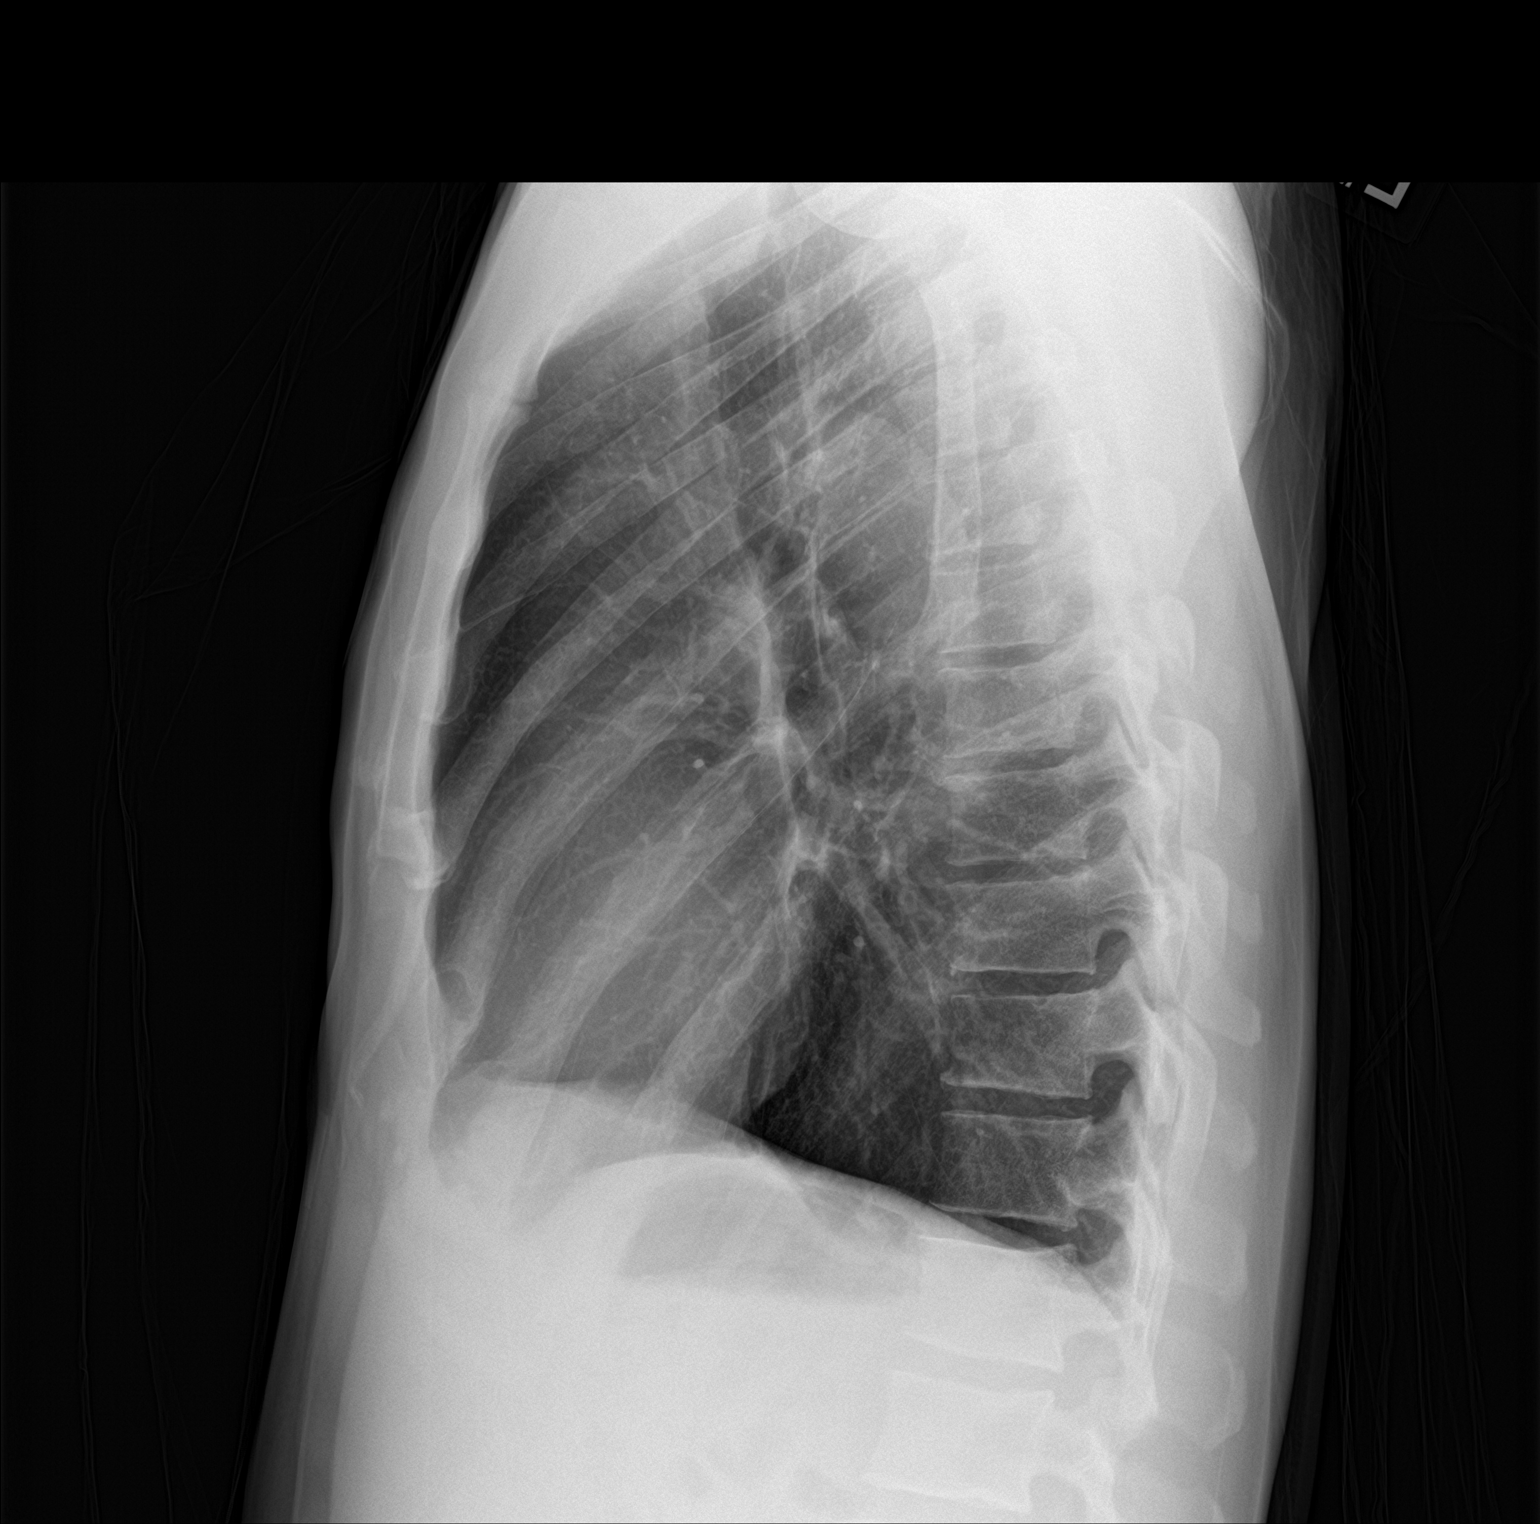

[2 of 2 positions shown; findings below may reference images not displayed]

FINDINGS: Lung volumes are normal. No consolidative airspace disease. No
pleural effusions. No pneumothorax. No pulmonary nodule or mass
noted. Pulmonary vasculature and the cardiomediastinal silhouette
are within normal limits.
IMPRESSION: No radiographic evidence of acute cardiopulmonary disease.

## 2017-11-28 MED ORDER — DOCUSATE SODIUM 100 MG PO CAPS
100.0000 mg | ORAL_CAPSULE | Freq: Two times a day (BID) | ORAL | Status: DC
  Administered 2017-11-29 – 2017-11-30 (×4): 100 mg via ORAL
  Filled 2017-11-28 (×4): qty 1

## 2017-11-28 MED ORDER — METOPROLOL TARTRATE 25 MG PO TABS
25.0000 mg | ORAL_TABLET | Freq: Two times a day (BID) | ORAL | Status: DC
  Administered 2017-11-29 (×2): 25 mg via ORAL
  Filled 2017-11-28 (×2): qty 1

## 2017-11-28 MED ORDER — ACETAMINOPHEN 650 MG RE SUPP: 650.0000 mg | Freq: Four times a day (QID) | RECTAL | Status: DC | PRN

## 2017-11-28 MED ORDER — MORPHINE SULFATE (PF) 4 MG/ML IV SOLN
4.0000 mg | Freq: Once | INTRAVENOUS | Status: AC
  Administered 2017-11-28: 4 mg via INTRAVENOUS
  Filled 2017-11-28: qty 1

## 2017-11-28 MED ORDER — HEPARIN SODIUM (PORCINE) 5000 UNIT/ML IJ SOLN
4000.0000 [IU] | Freq: Once | INTRAMUSCULAR | Status: AC
  Administered 2017-11-28: 4000 [IU] via INTRAVENOUS
  Filled 2017-11-28: qty 1

## 2017-11-28 MED ORDER — ACETAMINOPHEN 325 MG PO TABS
650.0000 mg | ORAL_TABLET | Freq: Four times a day (QID) | ORAL | Status: DC | PRN
  Administered 2017-11-29: 650 mg via ORAL
  Filled 2017-11-28: qty 2

## 2017-11-28 MED ORDER — ONDANSETRON HCL 4 MG/2ML IJ SOLN
4.0000 mg | INTRAMUSCULAR | Status: AC
  Administered 2017-11-28: 4 mg via INTRAVENOUS
  Filled 2017-11-28: qty 2

## 2017-11-28 MED ORDER — ASPIRIN 81 MG PO CHEW
CHEWABLE_TABLET | ORAL | Status: AC
  Filled 2017-11-28: qty 4

## 2017-11-28 MED ORDER — ONDANSETRON HCL 4 MG/2ML IJ SOLN: 4.0000 mg | Freq: Four times a day (QID) | INTRAMUSCULAR | Status: DC | PRN

## 2017-11-28 MED ORDER — HEPARIN (PORCINE) IN NACL 100-0.45 UNIT/ML-% IJ SOLN
1050.0000 [IU]/h | INTRAMUSCULAR | Status: DC
  Administered 2017-11-28: 900 [IU]/h via INTRAVENOUS
  Filled 2017-11-28: qty 250

## 2017-11-28 MED ORDER — ASPIRIN 81 MG PO CHEW
324.0000 mg | CHEWABLE_TABLET | Freq: Once | ORAL | Status: AC
  Administered 2017-11-28: 324 mg via ORAL
  Filled 2017-11-28: qty 4

## 2017-11-28 MED ORDER — LABETALOL HCL 5 MG/ML IV SOLN
10.0000 mg | Freq: Once | INTRAVENOUS | Status: AC
  Administered 2017-11-28: 10 mg via INTRAVENOUS
  Filled 2017-11-28: qty 4

## 2017-11-28 MED ORDER — LISINOPRIL 20 MG PO TABS
40.0000 mg | ORAL_TABLET | Freq: Every day | ORAL | Status: DC
Start: 2017-11-29 — End: 2017-11-29
  Administered 2017-11-29: 40 mg via ORAL
  Filled 2017-11-28: qty 2

## 2017-11-28 MED ORDER — ASPIRIN 325 MG PO TABS
325.0000 mg | ORAL_TABLET | Freq: Every day | ORAL | Status: DC
  Administered 2017-11-29: 325 mg via ORAL
  Filled 2017-11-28 (×2): qty 1

## 2017-11-28 MED ORDER — PRAVASTATIN SODIUM 20 MG PO TABS
20.0000 mg | ORAL_TABLET | Freq: Every day | ORAL | Status: DC
  Administered 2017-11-29: 20 mg via ORAL
  Filled 2017-11-28: qty 1

## 2017-11-28 MED ORDER — ISOSORBIDE MONONITRATE ER 30 MG PO TB24
30.0000 mg | ORAL_TABLET | Freq: Every day | ORAL | Status: DC
  Administered 2017-11-29 – 2017-11-30 (×2): 30 mg via ORAL
  Filled 2017-11-28 (×2): qty 1

## 2017-11-28 MED ORDER — ONDANSETRON HCL 4 MG PO TABS: 4.0000 mg | ORAL_TABLET | Freq: Four times a day (QID) | ORAL | Status: DC | PRN

## 2017-11-28 MED ORDER — HYDROCODONE-ACETAMINOPHEN 5-325 MG PO TABS
1.0000 | ORAL_TABLET | ORAL | Status: DC | PRN
  Administered 2017-11-29 (×2): 1 via ORAL
  Filled 2017-11-28 (×2): qty 1

## 2017-11-28 MED ORDER — BISACODYL 5 MG PO TBEC: 5.0000 mg | DELAYED_RELEASE_TABLET | Freq: Every day | ORAL | Status: DC | PRN

## 2017-11-28 NOTE — ED Triage Notes (Signed)
Pt c/o central chest pain that started at approximately 1430 today, accompanied with SOB. Pt had previous MI 5 years ago with 1 stent placed. Pt taken to 17H for further evaluation.

## 2017-11-28 NOTE — ED Provider Notes (Signed)
Medical screening examination/treatment/procedure(s) were conducted as a shared visit with non-physician practitioner(s) and myself.  I personally evaluated the patient during the encounter.  ED ECG REPORT I, Loleta Roseory Rydan Gulyas, the attending physician, personally viewed and interpreted this ECG.  Date: 11/28/2017 EKG Time: 19:12 Rate: 76 Rhythm: normal sinus rhythm QRS Axis: normal Intervals: normal ST/T Wave abnormalities: Significant deep inverted T waves in lead V5 and V6 with about 2 mm of ST elevation in V4 but no other corresponding ST segment elevation.  I compared this EKG to his last EKG from about 10 months ago and there is not a significant change from prior Narrative Interpretation: Concerning for acute ischemia but does not meet STEMI criteria.   Please see the main H&P for full details.  In summary, the patient has a history of hypertension and diabetes and has been seen by Dr. Welton FlakesKhan in the past for chest pain but apparently has never had a catheterization.  He had acute onset of  Sharp stabbing chest pain in the middle of his chest about 2:30 PM; he was in the middle of putting up a fence and took a break to eat some hot dogs and that was when the pain began.  The pain is not gone away since that time.  He reports some mild associated shortness of breath.  Nothing particular makes it better or worse.  His EKG is concerning for possible ischemia but is not significantly changed from a prior EKG, and it does not meet STEMI criteria.  However when his lab work came back it was most notable for a troponin of greater than 1.  I verify that he still did not have any significant ST segment elevation on the cardiac monitor.  He reports that his pain was unchanged.  I treated with morphine 4 mg IV, Zofran 4 mill grams IV, full dose aspirin, heparin bolus and infusion, and I called and spoke with phone with Dr. Kirke CorinArida, the cardiologist who is covering STEMI call.  He reviewed the EKG from today as  well as the old EKG and agreed that the patient did not meet STEMI criteria.  He recommended against Brilinta and advised medical admission and close monitoring.  I discussed the case with the hospitalist and updated the patient and family.  He was hemodynamically stable at the time of transfer to his bed upstairs.    Loleta RoseForbach, Martyna Thorns, MD 11/29/17 [phone]

## 2017-11-28 NOTE — H&P (Signed)
[facility] - [place] at [facility]   PATIENT NAME: Kenneth Morales    MR#:  [id number]  DATE OF BIRTH:  01/28/1966  DATE OF ADMISSION:  11/28/2017  PRIMARY CARE PHYSICIAN: [facility]   REQUESTING/REFERRING PHYSICIAN:   CHIEF COMPLAINT:   Chief Complaint  Patient presents with  . Chest Pain    HISTORY OF PRESENT ILLNESS: Kenneth Morales  is a 52 y.o. male with a known history of CAD, hypertension, cocaine and tobacco abuse hyperlipidemia, depression. Patient presented to emergency room for acute onset of chest pain started around 2:30 PM, while he was working outside and took a break to eat some hot dogs.  The pain is described as diffuse anterior chest wall pressure, which gets worse with exertion.  Some shortness of breath is associated.  No nausea, no diaphoresis, no fever or chills.  Patient had similar chest pain episodes in the past that will resolve on their own within 5 minutes.  This time, patient states the pain does not go away. At the arrival to emergency room, patient was noted with elevated blood pressure at 190/121.  He admits that he stopped taking all his medications few weeks ago, because there were too many pills.  He denies recent cocaine use or any other illicit drug use. Blood test done emergency room including CBC and CMP are grossly unremarkable.  However the first troponin level is elevated at 1.05. EKG, reviewed by myself, shows normal sinus rhythm with heart rate at 76, normal axis and normal intervals.  Inverted T waves are noted in lead V5 and V6 with about 2 mm of ST elevation in V4, but no other corresponding ST segment elevation.  This changes are very similar to prior EKG done 10 months ago. Chest x-ray is negative for any acute abnormalities. Patient is admitted for further evaluation and treatment.   PAST MEDICAL HISTORY:   Past Medical History:  Diagnosis Date  . Acute MI (HCC) MAY 2013  . CAD (coronary  artery disease)    NSTEMI in 05/13 in setting of Cocaine use. Cath: 99% mid LCX stenosis with a large thrombus. PCI and BMS (4.0 X15 mm Vision) placement to mid LCX, LAD: 20%, RCA: 30%, EF: 60%.   . Cocaine abuse (HCC)    quit in 08/2011  . Depression   . Hyperlipidemia   . Hypertension   . Spider bite   . Tobacco abuse     PAST SURGICAL HISTORY:  Past Surgical History:  Procedure Laterality Date  . CARDIAC CATHETERIZATION  MAY 2013   s/p stent @ [facility]  . CARDIAC CATHETERIZATION    . HEMORRHOID SURGERY      SOCIAL HISTORY:  Social History   Tobacco Use  . Smoking status: Current Every Day Smoker    Packs/day: 0.50    Years: 30.00    Pack years: 15.00    Types: Cigarettes  . Smokeless tobacco: Never Used  Substance Use Topics  . Alcohol use: Yes    Alcohol/week: 3.6 oz    Types: 6 Cans of beer per week    Comment: weekly    FAMILY HISTORY:  Family History  Problem Relation Age of Onset  . Heart attack Maternal Grandfather   . Heart attack Paternal Grandfather     DRUG ALLERGIES:  Allergies  Allergen Reactions  . Penicillins     rash    REVIEW OF SYSTEMS:   CONSTITUTIONAL: No fever, fatigue or weakness.  EYES: No  blurred or double vision.  EARS, NOSE, AND THROAT: No tinnitus or ear pain.  RESPIRATORY: No cough, wheezing or hemoptysis.  Positive for occasional shortness of breath associated with chest pain. CARDIOVASCULAR: Positive for chest pain.  No orthopnea, edema.  GASTROINTESTINAL: No nausea, vomiting, diarrhea or abdominal pain.  GENITOURINARY: No dysuria, hematuria.  ENDOCRINE: No polyuria, nocturia,  HEMATOLOGY: No anemia, easy bruising or bleeding SKIN: No rash or lesion. MUSCULOSKELETAL: No joint pain or arthritis.   NEUROLOGIC: No tingling, numbness, weakness.  PSYCHIATRY: No anxiety or depression.   MEDICATIONS AT HOME:  Prior to Admission medications   Medication Sig Start Date End Date Taking? Authorizing Provider  acetaminophen  (TYLENOL) 500 MG tablet Take 500 mg by mouth every 6 (six) hours as needed.   Yes [provider]  aspirin 81 MG tablet Take 1 tablet (81 mg total) by mouth daily. 07/02/16  Yes Enid BaasKalisetti, Radhika, MD  clopidogrel (PLAVIX) 75 MG tablet Take 1 tablet (75 mg total) by mouth daily. 07/02/16  Yes Enid BaasKalisetti, Radhika, MD  docusate sodium (COLACE) 100 MG capsule Take 2 capsules (200 mg total) by mouth 2 (two) times daily. 01/14/15  Yes Sharman CheekStafford, Phillip, MD  isosorbide mononitrate (IMDUR) 30 MG 24 hr tablet Take 1 tablet (30 mg total) by mouth daily. 07/02/16  Yes Enid BaasKalisetti, Radhika, MD  lisinopril (PRINIVIL,ZESTRIL) 40 MG tablet Take 1 tablet (40 mg total) by mouth daily. 07/02/16  Yes Enid BaasKalisetti, Radhika, MD  metoprolol tartrate (LOPRESSOR) 25 MG tablet Take 1 tablet (25 mg total) by mouth 2 (two) times daily. 07/02/16  Yes Enid BaasKalisetti, Radhika, MD  pravastatin (PRAVACHOL) 20 MG tablet Take 1 tablet (20 mg total) by mouth daily. 07/02/16  Yes Enid BaasKalisetti, Radhika, MD      PHYSICAL EXAMINATION:   VITAL SIGNS: Blood pressure (!) 190/121, pulse 73, temperature 98.6 F (37 C), resp. rate 20, height 5\' 10"  (1.778 m), weight 72.6 kg (160 lb), SpO2 100 %.  GENERAL:  52 y.o.-year-old patient lying in the bed with no acute distress.  Patient still complains of chest pain but improved status post pain medication in emergency room. EYES: Pupils equal, round, reactive to light and accommodation. No scleral icterus. Extraocular muscles intact.  HEENT: Head atraumatic, normocephalic. Oropharynx and nasopharynx clear.  NECK:  Supple, no jugular venous distention. No thyroid enlargement, no tenderness.  LUNGS: Normal breath sounds bilaterally, no wheezing, rales,rhonchi or crepitation. No use of accessory muscles of respiration.  CARDIOVASCULAR: S1, S2 normal. No murmurs, rubs, or gallops.  ABDOMEN: Soft, nontender, nondistended. Bowel sounds present. No organomegaly or mass.  EXTREMITIES: No pedal edema, cyanosis, or  clubbing.  NEUROLOGIC: Cranial nerves II through XII are intact. Muscle strength 5/5 in all extremities. Sensation intact. Gait not checked.  PSYCHIATRIC: The patient is alert and oriented x 3.  SKIN: No obvious rash, lesion, or ulcer.   LABORATORY PANEL:   CBC Recent Labs  Lab 11/28/17 1917  WBC 8.6  HGB 15.5  HCT 43.7  PLT 229  MCV 96.8  MCH 34.3*  MCHC 35.5  RDW 13.0   ------------------------------------------------------------------------------------------------------------------  Chemistries  Recent Labs  Lab 11/28/17 1937  NA 140  K 4.1  CL 106  CO2 26  GLUCOSE 96  BUN 9  CREATININE 0.88  CALCIUM 9.3  MG 2.4  AST 32  ALT 15  ALKPHOS 95  BILITOT 0.7   ------------------------------------------------------------------------------------------------------------------ estimated creatinine clearance is 98.5 mL/min (by C-G formula based on SCr of 0.88 mg/dL). ------------------------------------------------------------------------------------------------------------------ No results for input(s): TSH, T4TOTAL, T3FREE,  THYROIDAB in the last 72 hours.  Invalid input(s): FREET3   Coagulation profile Recent Labs  Lab 11/28/17 1937  INR 0.89   ------------------------------------------------------------------------------------------------------------------- No results for input(s): DDIMER in the last 72 hours. -------------------------------------------------------------------------------------------------------------------  Cardiac Enzymes Recent Labs  Lab 11/28/17 1917  TROPONINI 1.05*   ------------------------------------------------------------------------------------------------------------------ Invalid input(s): POCBNP  ---------------------------------------------------------------------------------------------------------------  Urinalysis    Component Value Date/Time   COLORURINE STRAW (A) 12/15/2014 1144   APPEARANCEUR CLEAR (A)  12/15/2014 1144   APPEARANCEUR Clear 08/16/2014 0552   LABSPEC 1.011 12/15/2014 1144   LABSPEC 1.013 08/16/2014 0552   PHURINE 6.0 12/15/2014 1144   GLUCOSEU NEGATIVE 12/15/2014 1144   GLUCOSEU Negative 08/16/2014 0552   HGBUR NEGATIVE 12/15/2014 1144   BILIRUBINUR NEGATIVE 12/15/2014 1144   BILIRUBINUR Negative 08/16/2014 0552   KETONESUR NEGATIVE 12/15/2014 1144   PROTEINUR NEGATIVE 12/15/2014 1144   NITRITE NEGATIVE 12/15/2014 1144   LEUKOCYTESUR TRACE (A) 12/15/2014 1144   LEUKOCYTESUR Negative 08/16/2014 0552     RADIOLOGY: Dg Chest 2 View  Result Date: 11/28/2017 CLINICAL DATA:  52 year old male with central chest pain since 2:30 p.m. today with some shortness of breath. EXAM: CHEST - 2 VIEW COMPARISON:  Chest x-ray 02/22/2017. FINDINGS: Lung volumes are normal. No consolidative airspace disease. No pleural effusions. No pneumothorax. No pulmonary nodule or mass noted. Pulmonary vasculature and the cardiomediastinal silhouette are within normal limits. IMPRESSION: No radiographic evidence of acute cardiopulmonary disease. Electronically Signed   By: Trudie Reed M.D.   On: 11/28/2017 19:47    EKG: Orders placed or performed during the hospital encounter of 11/28/17  . EKG 12-Lead  . EKG 12-Lead  . EKG 12-Lead  . EKG 12-Lead  . ED EKG within 10 minutes  . ED EKG within 10 minutes    IMPRESSION AND PLAN:  1. NSTEMI.  We will start aspirin and heparin drip.  Continue to monitor closely on telemetry and follow troponin levels.  Will check 2D echo for further evaluation of cardiac function.  Will restart Imdur, metoprolol, lisinopril and statin from home medication list.  Cardiology is consulted for further evaluation and treatment. 2.  Hypertensive emergency.  Will restart home BP medications, including Imdur metoprolol lisinopril.  Continue to monitor blood pressure closely. 3.  Hyperlipidemia, will restart statin. 4.  CAD, status post MI 5 years ago. See management under  #1. 5.  Tobacco abuse.  Smoking cessation was discussed with patient in detail.  All the records are reviewed and case discussed with ED provider. Management plans discussed with the patient, who is in agreement.  CODE STATUS: Code Status History    Date Active Date Inactive Code Status Order ID Comments User Context   07/01/2016 2104 07/02/2016 1419 Full Code [id number]  Katharina Caper, MD Inpatient       TOTAL TIME TAKING CARE OF THIS PATIENT: 45 minutes.    Cammy Copa M.D on 11/28/2017 at 10:12 PM  Between 7am to 6pm - Pager - [phone]  After 6pm go to [email or web address] - password [email or web address] [facility]  [facility]  Office  [phone]  CC: Primary care physician; [facility]

## 2017-11-28 NOTE — ED Provider Notes (Signed)
[facility] Emergency Department Provider Note  ____________________________________________   None    (approximate)  I have reviewed the triage vital signs and the nursing notes.   HISTORY  Chief Complaint Chest Pain   HPI Kenneth Morales is a 52 y.o. male who presents to the emergency department for treatment and evaluation of chest pain that started at about 2:30 after eating a hot dog.  Patient states that he had a MI 5 years ago but this pain does not feel the same.  He states that he has diffuse anterior chest wall pressure seems to get worse with movement and with deep breath.  He did not have any nausea at onset.  He states he has had intermittent pain similar to this in the past that has gone away on its own.  He stopped taking all of his medications about 2 weeks ago because he was "taking so many pills he just was not sure if he was supposed to be taking all of that."   Past Medical History:  Diagnosis Date  . Acute MI (HCC) MAY 2013  . CAD (coronary artery disease)    NSTEMI in 05/13 in setting of Cocaine use. Cath: 99% mid LCX stenosis with a large thrombus. PCI and BMS (4.0 X15 mm Vision) placement to mid LCX, LAD: 20%, RCA: 30%, EF: 60%.   . Cocaine abuse (HCC)    quit in 08/2011  . Depression   . Hyperlipidemia   . Hypertension   . Spider bite   . Tobacco abuse     Patient Active Problem List   Diagnosis Date Noted  . NSTEMI (non-ST elevated myocardial infarction) (HCC) 11/28/2017  . 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 Chest pain 07/01/2016  . SOB (shortness of breath) 12/10/2011  . CAD (coronary artery disease)   . Hyperlipidemia   . Hypertension   . Tobacco abuse   . Cocaine abuse [facility])     Past Surgical History:  Procedure Laterality Date  . CARDIAC CATHETERIZATION  MAY 2013   s/p stent @ [facility]  . CARDIAC CATHETERIZATION    . HEMORRHOID SURGERY      Prior to Admission medications   Medication Sig Start Date  End Date Taking? Authorizing Provider  acetaminophen (TYLENOL) 500 MG tablet Take 500 mg by mouth every 6 (six) hours as needed.   Yes [provider]  aspirin 81 MG tablet Take 1 tablet (81 mg total) by mouth daily. 07/02/16  Yes Enid Baas, MD  clopidogrel (PLAVIX) 75 MG tablet Take 1 tablet (75 mg total) by mouth daily. 07/02/16  Yes Enid Baas, MD  docusate sodium (COLACE) 100 MG capsule Take 2 capsules (200 mg total) by mouth 2 (two) times daily. 01/14/15  Yes Sharman Cheek, MD  isosorbide mononitrate (IMDUR) 30 MG 24 hr tablet Take 1 tablet (30 mg total) by mouth daily. 07/02/16  Yes Enid Baas, MD  lisinopril (PRINIVIL,ZESTRIL) 40 MG tablet Take 1 tablet (40 mg total) by mouth daily. 07/02/16  Yes Enid Baas, MD  metoprolol tartrate (LOPRESSOR) 25 MG tablet Take 1 tablet (25 mg total) by mouth 2 (two) times daily. 07/02/16  Yes Enid Baas, MD  pravastatin (PRAVACHOL) 20 MG tablet Take 1 tablet (20 mg total) by mouth daily. 07/02/16  Yes Enid Baas, MD    Allergies Penicillins  Family History  Problem Relation Age of Onset  . Heart attack Maternal Grandfather   . Heart attack Paternal Grandfather     Social History  Social History   Tobacco Use  . Smoking status: Current Every Day Smoker    Packs/day: 0.50    Years: 30.00    Pack years: 15.00    Types: Cigarettes  . Smokeless tobacco: Never Used  Substance Use Topics  . Alcohol use: Yes    Alcohol/week: 3.6 oz    Types: 6 Cans of beer per week    Comment: weekly  . Drug use: No    Types: Cocaine    Comment: used cocaine 15-20 years    Review of Systems  Constitutional: No fever/chills Eyes: No visual changes. ENT: No sore throat. Cardiovascular: Positive for chest pain. Respiratory: Denies shortness of breath. Gastrointestinal: No abdominal pain.  No nausea, no vomiting.  Genitourinary: Negative for dysuria. Musculoskeletal: Negative for back pain. Skin: Negative  for rash. Neurological: Negative for headaches, focal weakness or numbness.  ____________________________________________   PHYSICAL EXAM:  VITAL SIGNS: ED Triage Vitals  Enc Vitals Group     BP 11/28/17 1916 (!) 196/103     Pulse Rate 11/28/17 1916 75     Resp 11/28/17 1916 17     Temp 11/28/17 1916 98.1 F (36.7 C)     Temp Source 11/28/17 1916 Oral     SpO2 11/28/17 1916 96 %     Weight 11/28/17 1917 160 lb (72.6 kg)     Height --      Head Circumference --      Peak Flow --      Pain Score 11/28/17 1917 10     Pain Loc --      Pain Edu? --      Excl. in GC? --     Constitutional: Alert and oriented. Well appearing and in no acute distress. Eyes: Conjunctivae are normal. Head: Atraumatic. Nose: No congestion/rhinnorhea. Mouth/Throat: Mucous membranes are moist.  Oropharynx non-erythematous. Neck: No stridor.   Cardiovascular: Normal rate, regular rhythm. Grossly normal heart sounds.  Good peripheral circulation. Respiratory: Normal respiratory effort.  No retractions. Lungs CTAB. Gastrointestinal: Soft and nontender. No distention. No abdominal bruits. No CVA tenderness. Musculoskeletal: No lower extremity tenderness nor edema.  No joint effusions. Neurologic:  Normal speech and language. No gross focal neurologic deficits are appreciated. No gait instability. Skin:  Skin is warm, dry and intact. No rash noted. Psychiatric: Mood and affect are normal. Speech and behavior are normal.  ____________________________________________   LABS (all labs ordered are listed, but only abnormal results are displayed)  Labs Reviewed  CBC - Abnormal; Notable for the following components:      Result Value   MCH 34.3 (*)    All other components within normal limits  TROPONIN I - Abnormal; Notable for the following components:   Troponin I 1.05 (*)    All other components within normal limits  MAGNESIUM  COMPREHENSIVE METABOLIC PANEL  LIPASE, BLOOD  PROTIME-INR  APTT    URINE DRUG SCREEN, QUALITATIVE ([facility] ONLY)  HEPARIN LEVEL (UNFRACTIONATED)  CBC  HIV ANTIBODY (ROUTINE TESTING)  BASIC METABOLIC PANEL   ____________________________________________  EKG  See MD note. ____________________________________________  RADIOLOGY  ED MD interpretation:  Chest negative for acute abnormality per radiology.  Official radiology report(s): Dg Chest 2 View  Result Date: 11/28/2017 CLINICAL DATA:  52 year old male with central chest pain since 2:30 p.m. today with some shortness of breath. EXAM: CHEST - 2 VIEW COMPARISON:  Chest x-ray 02/22/2017. FINDINGS: Lung volumes are normal. No consolidative airspace disease. No pleural effusions. No pneumothorax. No pulmonary nodule or  mass noted. Pulmonary vasculature and the cardiomediastinal silhouette are within normal limits. IMPRESSION: No radiographic evidence of acute cardiopulmonary disease. Electronically Signed   By: Trudie Reedaniel  Entrikin M.D.   On: 11/28/2017 19:47    ____________________________________________   PROCEDURES  Procedure(s) performed: None  Procedures  Critical Care performed: No  ____________________________________________   INITIAL IMPRESSION / ASSESSMENT AND PLAN / ED COURSE  As part of my medical decision making, I reviewed the following data within the electronic MEDICAL RECORD NUMBER    52 year old male presenting to the emergency department for treatment and evaluation of chest pain.  Pain is central and feels heavy.  Patient states that he does have a history of MI as well as acid reflux.  He has stopped taking all his medications and cannot determine whether this feels most similar to his GERD or previous MI.  Aspirin ordered.  Clinical Course as of Nov 30 11  Mon Nov 28, 2017  2138 Discussed case with Dr. Kirke CorinArida.  He reviewed the old ECG and the triage ECG from today and agrees it does not meet STEMI criteria.  Recommended against Brillinta, agreed with plan for ASA 325, heparin  bolus and infusion, and BP control.  He recommended labetalol 10 mg IV to start.  Will discuss with hospitalist.   [CF]    Clinical Course User Index [CF] Loleta RoseForbach, Cory, MD     ____________________________________________   FINAL CLINICAL IMPRESSION(S) / ED DIAGNOSES  Final diagnoses:  NSTEMI (non-ST elevated myocardial infarction) [facility](HCC)  Hypertensive emergency  Non compliance w medication regimen    ED Discharge Orders    None       Note:  This document was prepared using Dragon voice recognition software and may include unintentional dictation errors.    Chinita Pesterriplett, Ulah Olmo B, FNP 11/29/17 Kathrynn [id number]    Loleta RoseForbach, Cory, MD 11/29/17 2249

## 2017-11-28 NOTE — ED Notes (Addendum)
Date and time results received: 11/28/17 9:12 PM  Test: Troponin Critical Value: 1.05 ng/mL  Name of Provider Notified: Dr. York CeriseForbach

## 2017-11-28 NOTE — Progress Notes (Signed)
ANTICOAGULATION CONSULT NOTE - Initial Consult  Pharmacy Consult for heparin drip  Indication: chest pain/ACS  Allergies  Allergen Reactions  . Penicillins     rash    Patient Measurements: Height: 5\' 10"  (177.8 cm) Weight: 160 lb (72.6 kg) IBW/kg (Calculated) : 73 Heparin Dosing Weight: 73 kg  Vital Signs: Temp: 98.6 F (37 C) (08/05 2000) Temp Source: Oral (08/05 1916) BP: 190/121 (08/05 2126) Pulse Rate: 73 (08/05 2126)  Labs: Recent Labs    11/28/17 1917 11/28/17 1937  HGB 15.5  --   HCT 43.7  --   PLT 229  --   APTT  --  31  LABPROT  --  12.0  INR  --  0.89  CREATININE  --  0.88  TROPONINI 1.05*  --     Estimated Creatinine Clearance: 98.5 mL/min (by C-G formula based on SCr of 0.88 mg/dL).   Medical History: Past Medical History:  Diagnosis Date  . Acute MI (HCC) MAY 2013  . CAD (coronary artery disease)    NSTEMI in 05/13 in setting of Cocaine use. Cath: 99% mid LCX stenosis with a large thrombus. PCI and BMS (4.0 X15 mm Vision) placement to mid LCX, LAD: 20%, RCA: 30%, EF: 60%.   . Cocaine abuse (HCC)    quit in 08/2011  . Depression   . Hyperlipidemia   . Hypertension   . Spider bite   . Tobacco abuse     Medications:  No anticoag in PTA meds  Assessment: Trop 1.05  Goal of Therapy:  Heparin level 0.3-0.7 units/ml Monitor platelets by anticoagulation protocol: Yes   Plan:  4000 unit bolus and initial rate of 900 units/hr. First heparin level 6 hours after start of infusion.  Kimberli Winne S 11/28/2017,9:58 PM

## 2017-11-29 ENCOUNTER — Inpatient Hospital Stay
Admit: 2017-11-29 | Discharge: 2017-11-29 | Disposition: A | Discharge status: Home / Self Care | Payer: Medicaid Other | Attending: Internal Medicine | Admitting: Internal Medicine

## 2017-11-29 ENCOUNTER — Inpatient Hospital Stay: Payer: Medicaid Other

## 2017-11-29 ENCOUNTER — Encounter
Admission: EM | Disposition: A | Discharge status: Home / Self Care | Payer: Self-pay | Source: Home / Self Care | Attending: Internal Medicine

## 2017-11-29 DIAGNOSIS — Z955 Presence of coronary angioplasty implant and graft: Secondary | ICD-10-CM

## 2017-11-29 HISTORY — PX: CORONARY STENT INTERVENTION: CATH118234

## 2017-11-29 HISTORY — PX: LEFT HEART CATH AND CORONARY ANGIOGRAPHY: CATH118249

## 2017-11-29 LAB — BASIC METABOLIC PANEL
Anion gap: 7 (ref 5–15)
Anion gap: 7 (ref 5–15)
BUN: 9 mg/dL (ref 6–20)
BUN: 8 mg/dL (ref 6–20)
CO2: 26 mmol/L (ref 22–32)
CO2: 26 mmol/L (ref 22–32)
Calcium: 8.9 mg/dL (ref 8.9–10.3)
Calcium: 8.9 mg/dL (ref 8.9–10.3)
Chloride: 105 mmol/L (ref 98–111)
Chloride: 104 mmol/L (ref 98–111)
Creatinine, Ser: 1.04 mg/dL (ref 0.61–1.24)
Creatinine, Ser: 1 mg/dL (ref 0.61–1.24)
GFR calc Af Amer: >60 mL/min (ref >60)
GFR calc Af Amer: >60 mL/min (ref >60)
GFR calc non Af Amer: >60 mL/min (ref >60)
GFR calc non Af Amer: >60 mL/min (ref >60)
Glucose, Bld: 112 mg/dL — ABNORMAL HIGH (ref 70–99)
Glucose, Bld: 103 mg/dL — ABNORMAL HIGH (ref 70–99)
Potassium: 3.7 mmol/L (ref 3.5–5.1)
Potassium: 3.3 mmol/L — ABNORMAL LOW (ref 3.5–5.1)
Sodium: 138 mmol/L (ref 135–145)
Sodium: 137 mmol/L (ref 135–145)

## 2017-11-29 LAB — CBC
HCT: 41.8 % (ref 40.0–52.0)
HCT: 40.7 % (ref 40.0–52.0)
Hemoglobin: 14.7 g/dL (ref 13.0–18.0)
Hemoglobin: 14.4 g/dL (ref 13.0–18.0)
MCH: 34 pg (ref 26.0–34.0)
MCH: 34.2 pg — ABNORMAL HIGH (ref 26.0–34.0)
MCHC: 35.1 g/dL (ref 32.0–36.0)
MCHC: 35.3 g/dL (ref 32.0–36.0)
MCV: 96.9 fL (ref 80.0–100.0)
MCV: 96.7 fL (ref 80.0–100.0)
Platelets: 229 10*3/uL (ref 150–440)
Platelets: 210 10*3/uL (ref 150–440)
RBC: 4.32 MIL/uL — ABNORMAL LOW (ref 4.40–5.90)
RBC: 4.21 MIL/uL — ABNORMAL LOW (ref 4.40–5.90)
RDW: 13.2 % (ref 11.5–14.5)
RDW: 13 % (ref 11.5–14.5)
WBC: 9.1 10*3/uL (ref 3.8–10.6)
WBC: 8.2 10*3/uL (ref 3.8–10.6)

## 2017-11-29 LAB — PROTIME-INR
INR: 0.98
Prothrombin Time: 12.9 seconds (ref 11.4–15.2)

## 2017-11-29 LAB — HEPARIN LEVEL (UNFRACTIONATED)
Heparin Unfractionated: 0.19 IU/mL — ABNORMAL LOW (ref 0.30–0.70)
Heparin Unfractionated: 0.49 IU/mL (ref 0.30–0.70)

## 2017-11-29 LAB — GLUCOSE, CAPILLARY: Glucose-Capillary: 101 mg/dL — ABNORMAL HIGH (ref 70–99)

## 2017-11-29 LAB — POCT ACTIVATED CLOTTING TIME: Activated Clotting Time: 324 seconds

## 2017-11-29 LAB — TROPONIN I: Troponin I: 3.39 ng/mL (ref <0.03)

## 2017-11-29 SURGERY — LEFT HEART CATH AND CORONARY ANGIOGRAPHY
Anesthesia: Moderate Sedation | Laterality: Right

## 2017-11-29 MED ORDER — TICAGRELOR 90 MG PO TABS
ORAL_TABLET | ORAL | Status: AC
  Filled 2017-11-29: qty 2

## 2017-11-29 MED ORDER — OXYCODONE HCL 5 MG PO TABS
10.0000 mg | ORAL_TABLET | ORAL | Status: DC | PRN
  Filled 2017-11-29: qty 2

## 2017-11-29 MED ORDER — MIDAZOLAM HCL 2 MG/2ML IJ SOLN
INTRAMUSCULAR | Status: AC
  Filled 2017-11-29: qty 2

## 2017-11-29 MED ORDER — SODIUM CHLORIDE 0.9 % IV SOLN
INTRAVENOUS | Status: AC | PRN
  Administered 2017-11-29: 1.75 mg/kg/h via INTRAVENOUS

## 2017-11-29 MED ORDER — BIVALIRUDIN TRIFLUOROACETATE 250 MG IV SOLR
INTRAVENOUS | Status: AC
  Filled 2017-11-29: qty 250

## 2017-11-29 MED ORDER — NITROGLYCERIN 5 MG/ML IV SOLN
INTRAVENOUS | Status: AC
  Filled 2017-11-29: qty 10

## 2017-11-29 MED ORDER — ZOLPIDEM TARTRATE 5 MG PO TABS
5.0000 mg | ORAL_TABLET | Freq: Once | ORAL | Status: AC
  Administered 2017-11-29: 5 mg via ORAL
  Filled 2017-11-29: qty 1

## 2017-11-29 MED ORDER — MORPHINE SULFATE (PF) 2 MG/ML IV SOLN: 2.0000 mg | INTRAVENOUS | Status: DC | PRN

## 2017-11-29 MED ORDER — SODIUM CHLORIDE 0.9 % IV SOLN: 250.0000 mL | INTRAVENOUS | Status: DC | PRN

## 2017-11-29 MED ORDER — BIVALIRUDIN BOLUS VIA INFUSION - CUPID
INTRAVENOUS | Status: DC | PRN
  Administered 2017-11-29: 51.375 mg via INTRAVENOUS

## 2017-11-29 MED ORDER — LIDOCAINE HCL (PF) 1 % IJ SOLN
INTRAMUSCULAR | Status: AC
  Filled 2017-11-29: qty 30

## 2017-11-29 MED ORDER — ASPIRIN 81 MG PO CHEW
81.0000 mg | CHEWABLE_TABLET | ORAL | Status: AC
  Administered 2017-11-29: 81 mg via ORAL

## 2017-11-29 MED ORDER — ASPIRIN 81 MG PO CHEW
CHEWABLE_TABLET | ORAL | Status: AC
Start: 2017-11-29 — End: 2017-11-29
  Filled 2017-11-29: qty 1

## 2017-11-29 MED ORDER — OXYCODONE HCL 5 MG PO TABS: 10.0000 mg | ORAL_TABLET | ORAL | Status: DC | PRN

## 2017-11-29 MED ORDER — SODIUM CHLORIDE 0.9% FLUSH: 3.0000 mL | Freq: Two times a day (BID) | INTRAVENOUS | Status: DC

## 2017-11-29 MED ORDER — HEPARIN BOLUS VIA INFUSION
2200.0000 [IU] | Freq: Once | INTRAVENOUS | Status: AC
  Administered 2017-11-29: 2200 [IU] via INTRAVENOUS
  Filled 2017-11-29: qty 2200

## 2017-11-29 MED ORDER — SODIUM CHLORIDE 0.9% FLUSH: 3.0000 mL | INTRAVENOUS | Status: DC | PRN

## 2017-11-29 MED ORDER — CLOPIDOGREL BISULFATE 75 MG PO TABS: 75.0000 mg | ORAL_TABLET | Freq: Every day | ORAL | Status: DC

## 2017-11-29 MED ORDER — CLOPIDOGREL BISULFATE 75 MG PO TABS
300.0000 mg | ORAL_TABLET | Freq: Once | ORAL | Status: AC
  Administered 2017-11-29: 300 mg via ORAL
  Filled 2017-11-29: qty 4

## 2017-11-29 MED ORDER — SODIUM CHLORIDE 0.9 % WEIGHT BASED INFUSION
3.0000 mL/kg/h | INTRAVENOUS | Status: AC
  Administered 2017-11-29: 3 mL/kg/h via INTRAVENOUS

## 2017-11-29 MED ORDER — FENTANYL CITRATE (PF) 100 MCG/2ML IJ SOLN
INTRAMUSCULAR | Status: AC
  Filled 2017-11-29: qty 2

## 2017-11-29 MED ORDER — CLOPIDOGREL BISULFATE 75 MG PO TABS
75.0000 mg | ORAL_TABLET | Freq: Every day | ORAL | Status: DC
  Administered 2017-11-30: 75 mg via ORAL
  Filled 2017-11-29: qty 1

## 2017-11-29 MED ORDER — ATORVASTATIN CALCIUM 80 MG PO TABS
80.0000 mg | ORAL_TABLET | Freq: Every day | ORAL | Status: DC
  Administered 2017-11-29: 80 mg via ORAL
  Filled 2017-11-29 (×2): qty 1

## 2017-11-29 MED ORDER — ONDANSETRON HCL 4 MG/2ML IJ SOLN: 4.0000 mg | Freq: Four times a day (QID) | INTRAMUSCULAR | Status: DC | PRN

## 2017-11-29 MED ORDER — SODIUM CHLORIDE 0.9% FLUSH
3.0000 mL | Freq: Two times a day (BID) | INTRAVENOUS | Status: DC
  Administered 2017-11-29 – 2017-11-30 (×2): 3 mL via INTRAVENOUS

## 2017-11-29 MED ORDER — IOPAMIDOL (ISOVUE-300) INJECTION 61%
INTRAVENOUS | Status: DC | PRN
  Administered 2017-11-29: 100 mL via INTRA_ARTERIAL
  Administered 2017-11-29: 335 mL via INTRA_ARTERIAL

## 2017-11-29 MED ORDER — HEPARIN (PORCINE) IN NACL 1000-0.9 UT/500ML-% IV SOLN
INTRAVENOUS | Status: AC
Start: 2017-11-29 — End: ?
  Filled 2017-11-29: qty 1000

## 2017-11-29 MED ORDER — SODIUM CHLORIDE 0.9 % WEIGHT BASED INFUSION
1.0000 mL/kg/h | INTRAVENOUS | Status: DC
  Administered 2017-11-29: 1 mL/kg/h via INTRAVENOUS

## 2017-11-29 MED ORDER — SODIUM CHLORIDE 0.9 % WEIGHT BASED INFUSION: 1.0000 mL/kg/h | INTRAVENOUS | Status: DC

## 2017-11-29 MED ORDER — METOPROLOL TARTRATE 25 MG PO TABS
12.5000 mg | ORAL_TABLET | Freq: Two times a day (BID) | ORAL | Status: DC
  Administered 2017-11-29 – 2017-11-30 (×2): 12.5 mg via ORAL
  Filled 2017-11-29 (×2): qty 1

## 2017-11-29 MED ORDER — OXYCODONE HCL 5 MG PO TABS
5.0000 mg | ORAL_TABLET | ORAL | Status: DC | PRN
  Administered 2017-11-29: 10 mg via ORAL
  Filled 2017-11-29: qty 2

## 2017-11-29 MED ORDER — TICAGRELOR 90 MG PO TABS
ORAL_TABLET | ORAL | Status: DC | PRN
  Administered 2017-11-29: 180 mg via ORAL

## 2017-11-29 MED ORDER — ASPIRIN 81 MG PO CHEW
81.0000 mg | CHEWABLE_TABLET | Freq: Every day | ORAL | Status: DC
  Administered 2017-11-30: 81 mg via ORAL
  Filled 2017-11-29: qty 1

## 2017-11-29 MED ORDER — LISINOPRIL 10 MG PO TABS
10.0000 mg | ORAL_TABLET | Freq: Every day | ORAL | Status: DC
  Administered 2017-11-30: 10 mg via ORAL
  Filled 2017-11-29: qty 1

## 2017-11-29 MED ORDER — MIDAZOLAM HCL 2 MG/2ML IJ SOLN
INTRAMUSCULAR | Status: DC | PRN
  Administered 2017-11-29: 1 mg via INTRAVENOUS

## 2017-11-29 MED ORDER — ACETAMINOPHEN 325 MG PO TABS: 650.0000 mg | ORAL_TABLET | ORAL | Status: DC | PRN

## 2017-11-29 MED ORDER — LABETALOL HCL 5 MG/ML IV SOLN: 10.0000 mg | INTRAVENOUS | Status: AC | PRN

## 2017-11-29 MED ORDER — ISOSORBIDE MONONITRATE ER 30 MG PO TB24
30.0000 mg | ORAL_TABLET | Freq: Once | ORAL | Status: AC
  Administered 2017-11-29: 30 mg via ORAL
  Filled 2017-11-29: qty 1

## 2017-11-29 MED ORDER — HYDRALAZINE HCL 20 MG/ML IJ SOLN: 5.0000 mg | INTRAMUSCULAR | Status: AC | PRN

## 2017-11-29 MED ORDER — SODIUM CHLORIDE 0.9% FLUSH
3.0000 mL | Freq: Two times a day (BID) | INTRAVENOUS | Status: DC
  Administered 2017-11-30: 3 mL via INTRAVENOUS

## 2017-11-29 MED ORDER — HYDROMORPHONE HCL 1 MG/ML IJ SOLN: 0.5000 mg | INTRAMUSCULAR | Status: DC | PRN

## 2017-11-29 MED ORDER — FENTANYL CITRATE (PF) 100 MCG/2ML IJ SOLN
INTRAMUSCULAR | Status: DC | PRN
  Administered 2017-11-29 (×2): 25 ug via INTRAVENOUS

## 2017-11-29 SURGICAL SUPPLY — 19 items
BALLN TREK RX 2.5X12 (BALLOONS) ×3
BALLOON TREK RX 2.5X12 (BALLOONS) IMPLANT
CATH INFINITI 5FR ANG PIGTAIL (CATHETERS) ×1 IMPLANT
CATH INFINITI 5FR JL4 (CATHETERS) ×1 IMPLANT
CATH INFINITI JR4 5F (CATHETERS) ×1 IMPLANT
CATH VISTA GUIDE 6FR JR4 SH (CATHETERS) ×1 IMPLANT
CATH VISTA GUIDE 6FR XB3.5 SH (CATHETERS) ×1 IMPLANT
DEVICE CLOSURE MYNXGRIP 6/7F (Vascular Products) ×1 IMPLANT
DEVICE INFLAT 30 PLUS (MISCELLANEOUS) ×1 IMPLANT
KIT MANI 3VAL PERCEP (MISCELLANEOUS) ×3 IMPLANT
NDL PERC 18GX7CM (NEEDLE) IMPLANT
NEEDLE PERC 18GX7CM (NEEDLE) ×3 IMPLANT
PACK CARDIAC CATH (CUSTOM PROCEDURE TRAY) ×3 IMPLANT
SHEATH AVANTI 5FR X 11CM (SHEATH) ×1 IMPLANT
SHEATH AVANTI 6FR X 11CM (SHEATH) ×1 IMPLANT
WIRE G HI TQ BMW 190 (WIRE) ×2 IMPLANT
WIRE GUIDERIGHT .035X150 (WIRE) ×1 IMPLANT
WIRE HI TORQ WHISPER MS 190CM (WIRE) ×1 IMPLANT
WIRE INTUITION PROPEL ST 180CM (WIRE) ×1 IMPLANT

## 2017-11-29 NOTE — Progress Notes (Signed)
Notified MD of pt blood pressure. Given 25 mg po of metoprolol. MD ordered to give Imdur 30mg  at this time. Per MD do not want to lower blood pressure too fast, at least get it to systolic to 160 by morning.

## 2017-11-29 NOTE — Progress Notes (Signed)
ANTICOAGULATION CONSULT NOTE - Initial Consult  Pharmacy Consult for heparin drip  Indication: chest pain/ACS  Allergies  Allergen Reactions  . Penicillins     rash    Patient Measurements: Height: 5\' 10"  (177.8 cm) Weight: 151 lb 3.2 oz (68.6 kg) IBW/kg (Calculated) : 73 Heparin Dosing Weight: 73 kg  Vital Signs: Temp: 98.4 F (36.9 C) (08/06 0323) Temp Source: Oral (08/06 0323) BP: 132/95 (08/06 0323) Pulse Rate: 61 (08/06 0323)  Labs: Recent Labs    11/28/17 1917 11/28/17 1937 11/29/17 0511  HGB 15.5  --  14.7  HCT 43.7  --  41.8  PLT 229  --  229  APTT  --  31  --   LABPROT  --  12.0  --   INR  --  0.89  --   HEPARINUNFRC  --   --  0.19*  CREATININE  --  0.88  --   TROPONINI 1.05*  --   --     Estimated Creatinine Clearance: 93.1 mL/min (by C-G formula based on SCr of 0.88 mg/dL).   Medical History: Past Medical History:  Diagnosis Date  . Acute MI (HCC) MAY 2013  . CAD (coronary artery disease)    NSTEMI in 05/13 in setting of Cocaine use. Cath: 99% mid LCX stenosis with a large thrombus. PCI and BMS (4.0 X15 mm Vision) placement to mid LCX, LAD: 20%, RCA: 30%, EF: 60%.   . Cocaine abuse (HCC)    quit in 08/2011  . Depression   . Hyperlipidemia   . Hypertension   . Spider bite   . Tobacco abuse     Medications:  No anticoag in PTA meds  Assessment: Trop 1.05  Goal of Therapy:  Heparin level 0.3-0.7 units/ml Monitor platelets by anticoagulation protocol: Yes   Plan:  4000 unit bolus and initial rate of 900 units/hr. First heparin level 6 hours after start of infusion.  08/06 0500 heparin level 0.19. 2200 unit bolus and increase rate to 1050 units/hr. Recheck in 6 hours.  Krayton Wortley S 11/29/2017,5:55 AM

## 2017-11-29 NOTE — Plan of Care (Signed)
  Problem: Education: Goal: Knowledge of General Education information will improve Description Including pain rating scale, medication(s)/side effects and non-pharmacologic comfort measures Outcome: Progressing   Problem: Health Behavior/Discharge Planning: Goal: Ability to manage health-related needs will improve Outcome: Progressing   Problem: Clinical Measurements: Goal: Diagnostic test results will improve Outcome: Progressing   Problem: Cardiac: Goal: Ability to achieve and maintain adequate cardiovascular perfusion will improve Outcome: Progressing

## 2017-11-29 NOTE — Plan of Care (Signed)

## 2017-11-29 NOTE — Progress Notes (Signed)
Patient ID: Kenneth Morales, male   DOB: [birth year], 52 y.o.   MRN: [id number]  [employer] PROGRESS NOTE  Kenneth Morales NFA:[id number] DOB: [birth year] DOA: 11/28/2017 PCP: [facility]  HPI/Subjective: Patient's seen this morning and he was feeling better and had no complaints of chest pain or shortness of breath.  He did have chest pressure yesterday and some shortness of breath and sweating.  Post cardiac catheterization today he was having pain in the groin 10 out of 10 intensity radiating into his lower abdomen.  Objective: Vitals:   11/29/17 1515 11/29/17 1541  BP:  118/90  Pulse: (!) 57 (!) 52  Resp: 12 16  Temp:  97.8 F (36.6 C)  SpO2: 97% 99%    Filed Weights   11/28/17 1955 11/29/17 0334 11/29/17 1129  Weight: 72.6 kg (160 lb) 68.6 kg (151 lb 3.2 oz) 68.5 kg (151 lb)    ROS: Review of Systems  Constitutional: Negative for chills and fever.  Eyes: Negative for blurred vision.  Respiratory: Negative for cough and shortness of breath.   Cardiovascular: Negative for chest pain.  Gastrointestinal: Positive for abdominal pain. Negative for constipation, diarrhea, nausea and vomiting.  Genitourinary: Negative for dysuria.  Musculoskeletal: Negative for joint pain.  Neurological: Negative for dizziness and headaches.   Exam: Physical Exam  Constitutional: He is oriented to person, place, and time.  HENT:  Nose: No mucosal edema.  Mouth/Throat: No oropharyngeal exudate or posterior oropharyngeal edema.  Eyes: Pupils are equal, round, and reactive to light. Conjunctivae, EOM and lids are normal.  Neck: No JVD present. Carotid bruit is not present. No edema present. No thyroid mass and no thyromegaly present.  Cardiovascular: S1 normal and S2 normal. Exam reveals no gallop.  No murmur heard. Pulses:      Dorsalis pedis pulses are 2+ on the right side, and 2+ on the left side.  Respiratory: No respiratory distress. He has no wheezes. He has  no rhonchi. He has no rales.  GI: Soft. Bowel sounds are normal. There is tenderness in the right lower quadrant.  Musculoskeletal:       Right ankle: He exhibits no swelling.       Left ankle: He exhibits no swelling.  Lymphadenopathy:    He has no cervical adenopathy.  Neurological: He is alert and oriented to person, place, and time. No cranial nerve deficit.  Skin: Skin is warm. No rash noted. Nails show no clubbing.  Psychiatric: He has a normal mood and affect.      Data Reviewed: Basic Metabolic Panel: Recent Labs  Lab 11/28/17 1937 11/29/17 0511 11/29/17 0938  NA 140 138 137  K 4.1 3.7 3.3*  CL 106 105 104  CO2 26 26 26   GLUCOSE 96 112* 103*  BUN 9 9 8   CREATININE 0.88 1.04 1.00  CALCIUM 9.3 8.9 8.9  MG 2.4  --   --    Liver Function Tests: Recent Labs  Lab 11/28/17 1937  AST 32  ALT 15  ALKPHOS 95  BILITOT 0.7  PROT 7.1  ALBUMIN 4.0   Recent Labs  Lab 11/28/17 1937  LIPASE 25   CBC: Recent Labs  Lab 11/28/17 1917 11/29/17 0511 11/29/17 0938  WBC 8.6 9.1 8.2  HGB 15.5 14.7 14.4  HCT 43.7 41.8 40.7  MCV 96.8 96.9 96.7  PLT 229 229 210   Cardiac Enzymes: Recent Labs  Lab 11/28/17 1917 11/29/17 0511  TROPONINI 1.05* 3.39*    CBG:  Recent Labs  Lab 11/29/17 0815  GLUCAP 101*     Studies: Dg Chest 2 View  Result Date: 11/28/2017 CLINICAL DATA:  52 year old male with central chest pain since 2:30 p.m. today with some shortness of breath. EXAM: CHEST - 2 VIEW COMPARISON:  Chest x-ray 02/22/2017. FINDINGS: Lung volumes are normal. No consolidative airspace disease. No pleural effusions. No pneumothorax. No pulmonary nodule or mass noted. Pulmonary vasculature and the cardiomediastinal silhouette are within normal limits. IMPRESSION: No radiographic evidence of acute cardiopulmonary disease. Electronically Signed   By: Trudie Reed M.D.   On: 11/28/2017 19:47    Scheduled Meds: . aspirin  81 mg Oral Daily  . aspirin  325 mg Oral  Daily  . atorvastatin  80 mg Oral q1800  . [START ON 11/30/2017] clopidogrel  75 mg Oral Daily  . docusate sodium  100 mg Oral BID  . isosorbide mononitrate  30 mg Oral Daily  . lisinopril  40 mg Oral Daily  . metoprolol tartrate  12.5 mg Oral BID  . sodium chloride flush  3 mL Intravenous Q12H  . [START ON 11/30/2017] sodium chloride flush  3 mL Intravenous Q12H   Continuous Infusions: . sodium chloride    . [START ON 11/30/2017] sodium chloride    . sodium chloride 1 mL/kg/hr (11/29/17 1602)  . sodium chloride      Assessment/Plan:  1. NSTEMI.  Patient initially placed on heparin drip aspirin and beta-blocker.  Patient had a blockage in the RCA which could not be crossed or stented at this time.  Medical management with aspirin, Plavix, atorvastatin and beta-blocker. 2. Right groin pain and right lower quadrant pain.  Could be a cardiac catheterization complication.  Get a CT scan of the abdomen and pelvis to rule out retroperitoneal bleed or groin hematoma.  Pain control with oxycodone and PRN morphine. 3. Essential hypertension on metoprolol and lisinopril 4. Hyperlipidemia unspecified on atorvastatin.  Check a lipid profile 5. Tobacco abuse.  Smoking cessation counseling done 4 minutes by me.  Code Status:     Code Status Orders  (From admission, onward)        Start     Ordered   11/28/17 2335  Full code  Continuous     11/28/17 2334    Code Status History    Date Active Date Inactive Code Status Order ID Comments User Context   07/01/2016 2104 07/02/2016 1419 Full Code [id number]  Katharina Caper, MD Inpatient      Disposition Plan: Evaluate daily for disposition  Consultants:  Cardiology  Time spent: 35 minutes, with evaluation this morning and again this afternoon.  Nirel Babler [employer]

## 2017-11-29 NOTE — Consult Note (Signed)
Kenneth Morales is a 52 y.o. male  [id number]  Primary Cardiologist: Adrian BlackwaterShaukat Cina Klumpp Reason for Consultation: non-STEMI  HPI: this is a 52 year old African-American male with a history of cocaine use and tobacco use and history of coronary artery disease presented to the hospital with chest pain . He says his chest pain was pressure type associated with shortness of breath and according all day and would not go away thus why he came to the hospital where his blood pressure was 190 /121. His troponins were elevated thus I was asked to evaluate the patient.she currently is not having chest pain.   Review of Systems: no orthopnea but is having headache   Past Medical History:  Diagnosis Date  . Acute MI (HCC) MAY 2013  . CAD (coronary artery disease)    NSTEMI in 05/13 in setting of Cocaine use. Cath: 99% mid LCX stenosis with a large thrombus. PCI and BMS (4.0 X15 mm Vision) placement to mid LCX, LAD: 20%, RCA: 30%, EF: 60%.   . Cocaine abuse (HCC)    quit in 08/2011  . Depression   . Hyperlipidemia   . Hypertension   . Spider bite   . Tobacco abuse     Medications Prior to Admission  Medication Sig Dispense Refill  . acetaminophen (TYLENOL) 500 MG tablet Take 500 mg by mouth every 6 (six) hours as needed.    Marland Kitchen. aspirin 81 MG tablet Take 1 tablet (81 mg total) by mouth daily. 30 tablet 2  . clopidogrel (PLAVIX) 75 MG tablet Take 1 tablet (75 mg total) by mouth daily. 30 tablet 2  . docusate sodium (COLACE) 100 MG capsule Take 2 capsules (200 mg total) by mouth 2 (two) times daily. 120 capsule 3  . isosorbide mononitrate (IMDUR) 30 MG 24 hr tablet Take 1 tablet (30 mg total) by mouth daily. 30 tablet 2  . lisinopril (PRINIVIL,ZESTRIL) 40 MG tablet Take 1 tablet (40 mg total) by mouth daily. 30 tablet 2  . metoprolol tartrate (LOPRESSOR) 25 MG tablet Take 1 tablet (25 mg total) by mouth 2 (two) times daily. 60 tablet 2  . pravastatin (PRAVACHOL) 20 MG tablet Take 1 tablet (20 mg total)  by mouth daily. 30 tablet 2     . aspirin  325 mg Oral Daily  . docusate sodium  100 mg Oral BID  . isosorbide mononitrate  30 mg Oral Daily  . lisinopril  40 mg Oral Daily  . metoprolol tartrate  25 mg Oral BID  . pravastatin  20 mg Oral Daily    Infusions: . heparin 1,050 Units/hr (11/29/17 [id number])    Allergies  Allergen Reactions  . Penicillins     rash    Social History   Socioeconomic History  . Marital status: Single    Spouse name: Not on file  . Number of children: Not on file  . Years of education: Not on file  . Highest education level: Not on file  Occupational History  . Not on file  Social Needs  . Financial resource strain: Not on file  . Food insecurity:    Worry: Not on file    Inability: Not on file  . Transportation needs:    Medical: Not on file    Non-medical: Not on file  Tobacco Use  . Smoking status: Current Every Day Smoker    Packs/day: 0.50    Years: 30.00    Pack years: 15.00    Types: Cigarettes  . Smokeless  tobacco: Never Used  Substance and Sexual Activity  . Alcohol use: Yes    Alcohol/week: 3.6 oz    Types: 6 Cans of beer per week    Comment: weekly  . Drug use: No    Types: Cocaine    Comment: used cocaine 15-20 years  . Sexual activity: Yes    Birth control/protection: Other-see comments  Lifestyle  . Physical activity:    Days per week: Not on file    Minutes per session: Not on file  . Stress: Not on file  Relationships  . Social connections:    Talks on phone: Not on file    Gets together: Not on file    Attends religious service: Not on file    Active member of club or organization: Not on file    Attends meetings of clubs or organizations: Not on file    Relationship status: Not on file  . Intimate partner violence:    Fear of current or ex partner: Not on file    Emotionally abused: Not on file    Physically abused: Not on file    Forced sexual activity: Not on file  Other Topics Concern  . Not on file   Social History Narrative  . Not on file    Family History  Problem Relation Age of Onset  . Heart attack Maternal Grandfather   . Heart attack Paternal Grandfather     PHYSICAL EXAM: Vitals:   11/29/17 0323 11/29/17 0815  BP: (!) 132/95 121/82  Pulse: 61 64  Resp: 18 14  Temp: 98.4 F (36.9 C) 98.3 F (36.8 C)  SpO2: 97% 97%     Intake/Output Summary (Last 24 hours) at 11/29/2017 0853 Last data filed at 11/29/2017 0300 Gross per 24 hour  Intake 39.15 ml  Output -  Net 39.15 ml    General:  Well appearing. No respiratory difficulty HEENT: normal Neck: supple. no JVD. Carotids 2+ bilat; no bruits. No lymphadenopathy or thryomegaly appreciated. Cor: PMI nondisplaced. Regular rate & rhythm. No rubs, gallops or murmurs. Lungs: clear Abdomen: soft, nontender, nondistended. No hepatosplenomegaly. No bruits or masses. Good bowel sounds. Extremities: no cyanosis, clubbing, rash, edema Neuro: alert & oriented x 3, cranial nerves grossly intact. moves all 4 extremities w/o difficulty. Affect pleasant.  ECG: normal sinus rhythm with deep T-wave inversion in anterolateral leads suggestive of ischemia  Results for orders placed or performed during the hospital encounter of 11/28/17 (from the past 24 hour(s))  CBC     Status: Abnormal   Collection Time: 11/28/17  7:17 PM  Result Value Ref Range   WBC 8.6 3.8 - 10.6 K/uL   RBC 4.51 4.40 - 5.90 MIL/uL   Hemoglobin 15.5 13.0 - 18.0 g/dL   HCT 96.0 45.4 - 09.8 %   MCV 96.8 80.0 - 100.0 fL   MCH 34.3 (H) 26.0 - 34.0 pg   MCHC 35.5 32.0 - 36.0 g/dL   RDW 11.9 14.7 - 82.9 %   Platelets 229 150 - 440 K/uL  Troponin I     Status: Abnormal   Collection Time: 11/28/17  7:17 PM  Result Value Ref Range   Troponin I 1.05 (HH) <0.03 ng/mL  Magnesium     Status: None   Collection Time: 11/28/17  7:37 PM  Result Value Ref Range   Magnesium 2.4 1.7 - 2.4 mg/dL  Comprehensive metabolic panel     Status: None   Collection Time: 11/28/17   7:37 PM  Result  Value Ref Range   Sodium 140 135 - 145 mmol/L   Potassium 4.1 3.5 - 5.1 mmol/L   Chloride 106 98 - 111 mmol/L   CO2 26 22 - 32 mmol/L   Glucose, Bld 96 70 - 99 mg/dL   BUN 9 6 - 20 mg/dL   Creatinine, Ser 1.61 0.61 - 1.24 mg/dL   Calcium 9.3 8.9 - 09.6 mg/dL   Total Protein 7.1 6.5 - 8.1 g/dL   Albumin 4.0 3.5 - 5.0 g/dL   AST 32 15 - 41 U/L   ALT 15 0 - 44 U/L   Alkaline Phosphatase 95 38 - 126 U/L   Total Bilirubin 0.7 0.3 - 1.2 mg/dL   GFR calc non Af Amer >60 >60 mL/min   GFR calc Af Amer >60 >60 mL/min   Anion gap 8 5 - 15  Lipase, blood     Status: None   Collection Time: 11/28/17  7:37 PM  Result Value Ref Range   Lipase 25 11 - 51 U/L  Protime-INR     Status: None   Collection Time: 11/28/17  7:37 PM  Result Value Ref Range   Prothrombin Time 12.0 11.4 - 15.2 seconds   INR 0.89   APTT     Status: None   Collection Time: 11/28/17  7:37 PM  Result Value Ref Range   aPTT 31 24 - 36 seconds  Heparin level (unfractionated)     Status: Abnormal   Collection Time: 11/29/17  5:11 AM  Result Value Ref Range   Heparin Unfractionated 0.19 (L) 0.30 - 0.70 IU/mL  CBC     Status: Abnormal   Collection Time: 11/29/17  5:11 AM  Result Value Ref Range   WBC 9.1 3.8 - 10.6 K/uL   RBC 4.32 (L) 4.40 - 5.90 MIL/uL   Hemoglobin 14.7 13.0 - 18.0 g/dL   HCT 04.5 40.9 - 81.1 %   MCV 96.9 80.0 - 100.0 fL   MCH 34.0 26.0 - 34.0 pg   MCHC 35.1 32.0 - 36.0 g/dL   RDW 91.4 78.2 - 95.6 %   Platelets 229 150 - 440 K/uL  Basic metabolic panel     Status: Abnormal   Collection Time: 11/29/17  5:11 AM  Result Value Ref Range   Sodium 138 135 - 145 mmol/L   Potassium 3.7 3.5 - 5.1 mmol/L   Chloride 105 98 - 111 mmol/L   CO2 26 22 - 32 mmol/L   Glucose, Bld 112 (H) 70 - 99 mg/dL   BUN 9 6 - 20 mg/dL   Creatinine, Ser 2.13 0.61 - 1.24 mg/dL   Calcium 8.9 8.9 - 08.6 mg/dL   GFR calc non Af Amer >60 >60 mL/min   GFR calc Af Amer >60 >60 mL/min   Anion gap 7 5 - 15   Troponin I     Status: Abnormal   Collection Time: 11/29/17  5:11 AM  Result Value Ref Range   Troponin I 3.39 (HH) <0.03 ng/mL  Glucose, capillary     Status: Abnormal   Collection Time: 11/29/17  8:15 AM  Result Value Ref Range   Glucose-Capillary 101 (H) 70 - 99 mg/dL   Comment 1 Notify RN    Dg Chest 2 View  Result Date: 11/28/2017 CLINICAL DATA:  52 year old male with central chest pain since 2:30 p.m. today with some shortness of breath. EXAM: CHEST - 2 VIEW COMPARISON:  Chest x-ray 02/22/2017. FINDINGS: Lung volumes are normal. No consolidative airspace  disease. No pleural effusions. No pneumothorax. No pulmonary nodule or mass noted. Pulmonary vasculature and the cardiomediastinal silhouette are within normal limits. IMPRESSION: No radiographic evidence of acute cardiopulmonary disease. Electronically Signed   By: Trudie Reed M.D.   On: 11/28/2017 19:47     ASSESSMENT AND PLAN: acute coronary syndrome with elevated troponin over 3 with ischemic EKG changes suggestive of non-STEMI. Patient was explained risk and benefits and patient has agreed to have left heart catheterization which will be scheduled today.  Vielka Klinedinst A

## 2017-11-29 NOTE — Progress Notes (Signed)
Unable to cross lesion of mid RCA and Diagnal#3, thus add plavix and treat medically. May go home with f/u this Thursday at 10am.

## 2017-11-30 ENCOUNTER — Encounter: Payer: Self-pay | Admitting: Cardiovascular Disease

## 2017-11-30 DIAGNOSIS — Z955 Presence of coronary angioplasty implant and graft: Secondary | ICD-10-CM

## 2017-11-30 LAB — BASIC METABOLIC PANEL
Anion gap: 6 (ref 5–15)
BUN: 9 mg/dL (ref 6–20)
CO2: 24 mmol/L (ref 22–32)
Calcium: 8.6 mg/dL — ABNORMAL LOW (ref 8.9–10.3)
Chloride: 108 mmol/L (ref 98–111)
Creatinine, Ser: 0.88 mg/dL (ref 0.61–1.24)
GFR calc Af Amer: >60 mL/min (ref >60)
GFR calc non Af Amer: >60 mL/min (ref >60)
Glucose, Bld: 103 mg/dL — ABNORMAL HIGH (ref 70–99)
Potassium: 3.5 mmol/L (ref 3.5–5.1)
Sodium: 138 mmol/L (ref 135–145)

## 2017-11-30 LAB — LIPID PANEL
Cholesterol: 150 mg/dL (ref 0–200)
HDL: 32 mg/dL — ABNORMAL LOW (ref >40)
LDL Cholesterol: 89 mg/dL (ref 0–99)
Total CHOL/HDL Ratio: 4.7 RATIO
Triglycerides: 146 mg/dL (ref <150)
VLDL: 29 mg/dL (ref 0–40)

## 2017-11-30 LAB — CBC
HCT: 38.7 % — ABNORMAL LOW (ref 40.0–52.0)
Hemoglobin: 13.7 g/dL (ref 13.0–18.0)
MCH: 34.4 pg — ABNORMAL HIGH (ref 26.0–34.0)
MCHC: 35.3 g/dL (ref 32.0–36.0)
MCV: 97.4 fL (ref 80.0–100.0)
Platelets: 205 10*3/uL (ref 150–440)
RBC: 3.97 MIL/uL — ABNORMAL LOW (ref 4.40–5.90)
RDW: 12.9 % (ref 11.5–14.5)
WBC: 8.9 10*3/uL (ref 3.8–10.6)

## 2017-11-30 LAB — GLUCOSE, CAPILLARY: Glucose-Capillary: 155 mg/dL — ABNORMAL HIGH (ref 70–99)

## 2017-11-30 LAB — HIV ANTIBODY (ROUTINE TESTING W REFLEX): HIV Screen 4th Generation wRfx: NONREACTIVE

## 2017-11-30 MED ORDER — LISINOPRIL 10 MG PO TABS: 10.0000 mg | ORAL_TABLET | Freq: Every day | ORAL | 0 refills | Status: DC

## 2017-11-30 MED ORDER — ATORVASTATIN CALCIUM 80 MG PO TABS: 80.0000 mg | ORAL_TABLET | Freq: Every day | ORAL | 0 refills | Status: DC

## 2017-11-30 MED ORDER — OXYCODONE HCL 10 MG PO TABS: 10.0000 mg | ORAL_TABLET | Freq: Four times a day (QID) | ORAL | 0 refills | Status: AC | PRN

## 2017-11-30 MED ORDER — ISOSORBIDE MONONITRATE ER 30 MG PO TB24: 30.0000 mg | ORAL_TABLET | Freq: Every day | ORAL | 0 refills | Status: DC

## 2017-11-30 MED ORDER — CLOPIDOGREL BISULFATE 75 MG PO TABS: 75.0000 mg | ORAL_TABLET | Freq: Every day | ORAL | 0 refills | Status: DC

## 2017-11-30 MED ORDER — METOPROLOL TARTRATE 25 MG PO TABS: 12.5000 mg | ORAL_TABLET | Freq: Two times a day (BID) | ORAL | 0 refills | Status: DC

## 2017-11-30 NOTE — Discharge Summary (Signed)
[facility] - [place] at [facility]   PATIENT NAME: Kenneth Morales    MR#:  [id number]  DATE OF BIRTH:  1965/04/30  DATE OF ADMISSION:  11/28/2017 ADMITTING PHYSICIAN: Cammy Copa, MD  DATE OF DISCHARGE: 11/30/2017  PRIMARY CARE PHYSICIAN: [facility]    ADMISSION DIAGNOSIS:  NSTEMI (non-ST elevated myocardial infarction) (HCC) [I21.4] Non compliance w medication regimen [Z91.14] Hypertensive emergency [I16.1]  DISCHARGE DIAGNOSIS:  Active Problems:   NSTEMI (non-ST elevated myocardial infarction) (HCC)   SECONDARY DIAGNOSIS:   Past Medical History:  Diagnosis Date  . Acute MI (HCC) MAY 2013  . CAD (coronary artery disease)    NSTEMI in 05/13 in setting of Cocaine use. Cath: 99% mid LCX stenosis with a large thrombus. PCI and BMS (4.0 X15 mm Vision) placement to mid LCX, LAD: 20%, RCA: 30%, EF: 60%.   . Cocaine abuse (HCC)    quit in 08/2011  . Depression   . Hyperlipidemia   . Hypertension   . Spider bite   . Tobacco abuse     HOSPITAL COURSE:   1.  NSTEMI.  Patient was initially placed on heparin drip aspirin and beta-blocker.  Cardiac catheterization showed a 99% blockage of the RCA which could not be crossed with the guidewire.  Medical management recommended with aspirin, Plavix atorvastatin and beta-blocker. 2.  Retroperitoneal bleed status post cardiac catheterization with right groin pain and right lower quadrant pain.  Patient watched overnight and having less pain today.  Hemoglobin stable and vital signs stable.  Pain control with oxycodone to go home with. 3.  Essential hypertension on metoprolol and lisinopril.  I cut back on his dosages. 4.  Hyperlipidemia unspecified.  LDL 89.  On high intensity atorvastatin at this time.  Goal LDL less than 70. 5.  Tobacco abuse.  Patient must stop smoking.  DISCHARGE CONDITIONS:   Satisfactory  CONSULTS OBTAINED:  Treatment Team:  Laurier Nancy, MD  DRUG ALLERGIES:    Allergies  Allergen Reactions  . Penicillins     rash    DISCHARGE MEDICATIONS:   Allergies as of 11/30/2017      Reactions   Penicillins    rash      Medication List    STOP taking these medications   pravastatin 20 MG tablet Commonly known as:  PRAVACHOL     TAKE these medications   acetaminophen 500 MG tablet Commonly known as:  TYLENOL Take 500 mg by mouth every 6 (six) hours as needed.   aspirin 81 MG tablet Take 1 tablet (81 mg total) by mouth daily.   atorvastatin 80 MG tablet Commonly known as:  LIPITOR Take 1 tablet (80 mg total) by mouth daily at 6 PM.   clopidogrel 75 MG tablet Commonly known as:  PLAVIX Take 1 tablet (75 mg total) by mouth daily.   docusate sodium 100 MG capsule Commonly known as:  COLACE Take 2 capsules (200 mg total) by mouth 2 (two) times daily.   isosorbide mononitrate 30 MG 24 hr tablet Commonly known as:  IMDUR Take 1 tablet (30 mg total) by mouth daily.   lisinopril 10 MG tablet Commonly known as:  PRINIVIL,ZESTRIL Take 1 tablet (10 mg total) by mouth daily. What changed:    medication strength  how much to take   metoprolol tartrate 25 MG tablet Commonly known as:  LOPRESSOR Take 0.5 tablets (12.5 mg total) by mouth 2 (two) times daily. What changed:  how much to take  Oxycodone HCl 10 MG Tabs Take 1 tablet (10 mg total) by mouth every 6 (six) hours as needed for up to 3 days for moderate pain.        DISCHARGE INSTRUCTIONS:    Follow-up Dr. Welton Flakes cardiology on Thursday in the office at 10 AM Follow-up PMD 5 days  If you experience worsening of your admission symptoms, develop shortness of breath, life threatening emergency, suicidal or homicidal thoughts you must seek medical attention immediately by calling 911 or calling your MD immediately  if symptoms less severe.  You Must read complete instructions/literature along with all the possible adverse reactions/side effects for all the Medicines you take  and that have been prescribed to you. Take any new Medicines after you have completely understood and accept all the possible adverse reactions/side effects.   Please note  You were cared for by a hospitalist during your hospital stay. If you have any questions about your discharge medications or the care you received while you were in the hospital after you are discharged, you can call the unit and asked to speak with the hospitalist on call if the hospitalist that took care of you is not available. Once you are discharged, your primary care physician will handle any further medical issues. Please note that NO REFILLS for any discharge medications will be authorized once you are discharged, as it is imperative that you return to your primary care physician (or establish a relationship with a primary care physician if you do not have one) for your aftercare needs so that they can reassess your need for medications and monitor your lab values.    Today   CHIEF COMPLAINT:   Chief Complaint  Patient presents with  . Chest Pain    HISTORY OF PRESENT ILLNESS:  Kenneth Morales  is a 52 y.o. male presented with chest pain   VITAL SIGNS:  Blood pressure (!) 142/90, pulse 73, temperature 98.3 F (36.8 C), temperature source Oral, resp. rate 20, height 5\' 10"  (1.778 m), weight 70.4 kg (155 lb 3.2 oz), SpO2 97 %.   PHYSICAL EXAMINATION:  GENERAL:  52 y.o.-year-old patient lying in the bed with no acute distress.  EYES: Pupils equal, round, reactive to light and accommodation. No scleral icterus. Extraocular muscles intact.  HEENT: Head atraumatic, normocephalic. Oropharynx and nasopharynx clear.  NECK:  Supple, no jugular venous distention. No thyroid enlargement, no tenderness.  LUNGS: Normal breath sounds bilaterally, no wheezing, rales,rhonchi or crepitation. No use of accessory muscles of respiration.  CARDIOVASCULAR: S1, S2 normal. No murmurs, rubs, or gallops.  ABDOMEN: Soft, non-tender,  non-distended. Bowel sounds present. No organomegaly or mass.  EXTREMITIES: No pedal edema, cyanosis, or clubbing.  Soreness in the right groin.  No hematoma seen. NEUROLOGIC: Cranial nerves II through XII are intact. Muscle strength 5/5 in all extremities. Sensation intact. Gait not checked.  PSYCHIATRIC: The patient is alert and oriented x 3.  SKIN: No obvious rash, lesion, or ulcer.   DATA REVIEW:   CBC Recent Labs  Lab 11/30/17 0507  WBC 8.9  HGB 13.7  HCT 38.7*  PLT 205    Chemistries  Recent Labs  Lab 11/28/17 1937  11/30/17 0507  NA 140   < > 138  K 4.1   < > 3.5  CL 106   < > 108  CO2 26   < > 24  GLUCOSE 96   < > 103*  BUN 9   < > 9  CREATININE 0.88   < >  0.88  CALCIUM 9.3   < > 8.6*  MG 2.4  --   --   AST 32  --   --   ALT 15  --   --   ALKPHOS 95  --   --   BILITOT 0.7  --   --    < > = values in this interval not displayed.    Cardiac Enzymes Recent Labs  Lab 11/29/17 0511  TROPONINI 3.39*      RADIOLOGY:  Ct Abdomen Pelvis Wo Contrast  Result Date: 11/30/2017 CLINICAL DATA:  Cardiac cath today with RT groin pain. Evaluate for retroperitoneal bleed. EXAM: CT ABDOMEN AND PELVIS WITHOUT CONTRAST TECHNIQUE: Multidetector CT imaging of the abdomen and pelvis was performed following the standard protocol without IV contrast. COMPARISON:  Chest x-ray 11/28/2017 FINDINGS: Lower chest: Mitral calcification. Heart size is normal. Lung bases are clear. Hepatobiliary: Small cyst is identified in the LEFT hepatic lobe measuring 6 millimeters in diameter. Gallbladder is present. There is contrast in the gallbladder following cardiac catheterization earlier today. Pancreas: Unremarkable. No pancreatic ductal dilatation or surrounding inflammatory changes. Spleen: Normal in size without focal abnormality. Adrenals/Urinary Tract: Adrenal glands are unremarkable. Kidneys are normal, without renal calculi, focal lesion, or hydronephrosis. Bladder is unremarkable.  Stomach/Bowel: The stomach and small bowel loops are normal in appearance. There are colonic diverticula but no acute diverticulitis. The appendix is not well seen. Vascular/Lymphatic: There is dense atherosclerotic calcification of the abdominal aorta. No aneurysm. No retroperitoneal or mesenteric adenopathy. Reproductive: Prostatic calcifications present. Other: Within the RIGHT groin, there is high attenuation material consistent with inguinal hematoma. Hematoma extends inferiorly to the RIGHT hemiscrotum and superiorly into the RIGHT paracolic gutter and RIGHT retroperitoneum. Largest axial measurement is 2.5 x 1.6 centimeters. Hematoma extends 10.4 centimeters superior to inferior. No free pelvic fluid or hemorrhage. Musculoskeletal: No acute or significant osseous findings. IMPRESSION: 1. RIGHT groin hematoma extends into the RIGHT retroperitoneum and into the RIGHT hemiscrotum. 2. Hematoma measures 2.5 x 1.6 x 10.4 centimeters. 3.  Aortic atherosclerosis.  (ICD10-I70.0) 4. Colonic diverticulosis. These results were called by telephone at the time of interpretation on 11/29/2017 at 5:07 pm to Dr. Alford Highland , who verbally acknowledged these results. Electronically Signed   By: Norva Pavlov M.D.   On: 11/30/2017 08:35   Dg Chest 2 View  Result Date: 11/28/2017 CLINICAL DATA:  52 year old male with central chest pain since 2:30 p.m. today with some shortness of breath. EXAM: CHEST - 2 VIEW COMPARISON:  Chest x-ray 02/22/2017. FINDINGS: Lung volumes are normal. No consolidative airspace disease. No pleural effusions. No pneumothorax. No pulmonary nodule or mass noted. Pulmonary vasculature and the cardiomediastinal silhouette are within normal limits. IMPRESSION: No radiographic evidence of acute cardiopulmonary disease. Electronically Signed   By: Trudie Reed M.D.   On: 11/28/2017 19:47      Management plans discussed with the patient, family and they are in agreement.  CODE STATUS:      Code Status Orders  (From admission, onward)        Start     Ordered   11/28/17 2335  Full code  Continuous     11/28/17 2334    Code Status History    Date Active Date Inactive Code Status Order ID Comments User Context   07/01/2016 2104 07/02/2016 1419 Full Code [id number]  Katharina Caper, MD Inpatient      TOTAL TIME TAKING CARE OF THIS PATIENT: 35 minutes.    Kenneth Morales [employer] M.D  on 11/30/2017 at 1:52 PM  Between 7am to 6pm - Pager - [phone]  After 6pm go to [email or web address] - Social research officer, governmentpassword [email or web address]  [facility] Office  [phone]  CC: Primary care physician; [facility]

## 2017-11-30 NOTE — Progress Notes (Signed)
Discharge instructions given to patient. Prescription given and put in discharge packet. Both IV's taken out and tele monitor off. Patient verbalized understanding with no further questions. Appointments also set up. Patient driving home, patient has ambulated well with no issues. Will be taken down to car in wheelchair by volunteer.

## 2017-11-30 NOTE — Plan of Care (Signed)
  Problem: Education: Goal: Knowledge of General Education information will improve Description Including pain rating scale, medication(s)/side effects and non-pharmacologic comfort measures Outcome: Adequate for Discharge   Problem: Health Behavior/Discharge Planning: Goal: Ability to manage health-related needs will improve Outcome: Adequate for Discharge   Problem: Clinical Measurements: Goal: Ability to maintain clinical measurements within normal limits will improve Outcome: Adequate for Discharge Goal: Will remain free from infection Outcome: Adequate for Discharge Goal: Diagnostic test results will improve Outcome: Adequate for Discharge Goal: Respiratory complications will improve Outcome: Adequate for Discharge Goal: Cardiovascular complication will be avoided Outcome: Adequate for Discharge   Problem: Activity: Goal: Risk for activity intolerance will decrease Outcome: Adequate for Discharge   Problem: Nutrition: Goal: Adequate nutrition will be maintained Outcome: Adequate for Discharge   Problem: Coping: Goal: Level of anxiety will decrease Outcome: Adequate for Discharge   Problem: Elimination: Goal: Will not experience complications related to bowel motility Outcome: Adequate for Discharge Goal: Will not experience complications related to urinary retention Outcome: Adequate for Discharge   Problem: Pain Managment: Goal: General experience of comfort will improve Outcome: Adequate for Discharge   Problem: Safety: Goal: Ability to remain free from injury will improve Outcome: Adequate for Discharge   Problem: Skin Integrity: Goal: Risk for impaired skin integrity will decrease Outcome: Adequate for Discharge   Problem: Education: Goal: Understanding of cardiac disease, CV risk reduction, and recovery process will improve Outcome: Adequate for Discharge Goal: Individualized Educational Video(s) Outcome: Adequate for Discharge   Problem:  Activity: Goal: Ability to tolerate increased activity will improve Outcome: Adequate for Discharge   Problem: Cardiac: Goal: Ability to achieve and maintain adequate cardiovascular perfusion will improve Outcome: Adequate for Discharge   Problem: Education: Goal: Understanding of CV disease, CV risk reduction, and recovery process will improve Outcome: Adequate for Discharge Goal: Individualized Educational Video(s) Outcome: Adequate for Discharge   Problem: Activity: Goal: Ability to return to baseline activity level will improve Outcome: Adequate for Discharge   Problem: Cardiovascular: Goal: Ability to achieve and maintain adequate cardiovascular perfusion will improve Outcome: Adequate for Discharge Goal: Vascular access site(s) Level 0-1 will be maintained Outcome: Adequate for Discharge   Problem: Health Behavior/Discharge Planning: Goal: Ability to safely manage health-related needs after discharge will improve Outcome: Adequate for Discharge

## 2017-11-30 NOTE — Progress Notes (Signed)
Patient taken down by volunteer.

## 2017-12-01 LAB — ECHOCARDIOGRAM COMPLETE
Height: 70 in
Weight: 2416 oz

## 2018-01-26 ENCOUNTER — Encounter (HOSPITAL_COMMUNITY): Payer: Self-pay

## 2018-01-26 ENCOUNTER — Emergency Department
Admission: EM | Admit: 2018-01-26 | Discharge: 2018-01-26 | Disposition: A | Discharge status: Other Acute Inpatient Hospital | Payer: Medicaid Other | Attending: Emergency Medicine | Admitting: Emergency Medicine

## 2018-01-26 ENCOUNTER — Other Ambulatory Visit: Payer: Self-pay

## 2018-01-26 ENCOUNTER — Emergency Department: Payer: Medicaid Other

## 2018-01-26 ENCOUNTER — Inpatient Hospital Stay (HOSPITAL_COMMUNITY)
Admission: AD | Admit: 2018-01-26 | Discharge: 2018-01-28 | DRG: 247 | Disposition: A | Discharge status: Home / Self Care | Payer: Medicaid Other | Source: Other Acute Inpatient Hospital | Attending: Cardiology | Admitting: Cardiology

## 2018-01-26 DIAGNOSIS — I249 Acute ischemic heart disease, unspecified: Secondary | ICD-10-CM

## 2018-01-26 DIAGNOSIS — I200 Unstable angina: Secondary | ICD-10-CM | POA: Diagnosis not present

## 2018-01-26 DIAGNOSIS — F17210 Nicotine dependence, cigarettes, uncomplicated: Secondary | ICD-10-CM | POA: Diagnosis present

## 2018-01-26 DIAGNOSIS — I10 Essential (primary) hypertension: Secondary | ICD-10-CM | POA: Diagnosis present

## 2018-01-26 DIAGNOSIS — Z955 Presence of coronary angioplasty implant and graft: Secondary | ICD-10-CM

## 2018-01-26 DIAGNOSIS — Z880 Allergy status to penicillin: Secondary | ICD-10-CM

## 2018-01-26 DIAGNOSIS — T464X6A Underdosing of angiotensin-converting-enzyme inhibitors, initial encounter: Secondary | ICD-10-CM | POA: Diagnosis present

## 2018-01-26 DIAGNOSIS — Z9119 Patient's noncompliance with other medical treatment and regimen: Secondary | ICD-10-CM

## 2018-01-26 DIAGNOSIS — I25110 Atherosclerotic heart disease of native coronary artery with unstable angina pectoris: Secondary | ICD-10-CM | POA: Diagnosis present

## 2018-01-26 DIAGNOSIS — E876 Hypokalemia: Secondary | ICD-10-CM | POA: Diagnosis present

## 2018-01-26 DIAGNOSIS — Y712 Prosthetic and other implants, materials and accessory cardiovascular devices associated with adverse incidents: Secondary | ICD-10-CM | POA: Diagnosis present

## 2018-01-26 DIAGNOSIS — Z79899 Other long term (current) drug therapy: Secondary | ICD-10-CM

## 2018-01-26 DIAGNOSIS — Z91128 Patient's intentional underdosing of medication regimen for other reason: Secondary | ICD-10-CM

## 2018-01-26 DIAGNOSIS — I252 Old myocardial infarction: Secondary | ICD-10-CM | POA: Insufficient documentation

## 2018-01-26 DIAGNOSIS — R0789 Other chest pain: Secondary | ICD-10-CM | POA: Insufficient documentation

## 2018-01-26 DIAGNOSIS — R110 Nausea: Secondary | ICD-10-CM | POA: Diagnosis not present

## 2018-01-26 DIAGNOSIS — R61 Generalized hyperhidrosis: Secondary | ICD-10-CM | POA: Insufficient documentation

## 2018-01-26 DIAGNOSIS — R0602 Shortness of breath: Secondary | ICD-10-CM | POA: Insufficient documentation

## 2018-01-26 DIAGNOSIS — T463X6A Underdosing of coronary vasodilators, initial encounter: Secondary | ICD-10-CM | POA: Diagnosis present

## 2018-01-26 DIAGNOSIS — I2510 Atherosclerotic heart disease of native coronary artery without angina pectoris: Secondary | ICD-10-CM | POA: Diagnosis not present

## 2018-01-26 DIAGNOSIS — Z720 Tobacco use: Secondary | ICD-10-CM | POA: Diagnosis present

## 2018-01-26 DIAGNOSIS — T82855A Stenosis of coronary artery stent, initial encounter: Secondary | ICD-10-CM | POA: Diagnosis present

## 2018-01-26 DIAGNOSIS — F14988 Cocaine use, unspecified with other cocaine-induced disorder: Secondary | ICD-10-CM | POA: Diagnosis present

## 2018-01-26 DIAGNOSIS — E785 Hyperlipidemia, unspecified: Secondary | ICD-10-CM | POA: Diagnosis present

## 2018-01-26 LAB — CBC WITH DIFFERENTIAL/PLATELET
Basophils Absolute: 0.1 10*3/uL (ref 0–0.1)
Basophils Relative: 1 %
Eosinophils Absolute: 0.1 10*3/uL (ref 0–0.7)
Eosinophils Relative: 2 %
HCT: 41.5 % (ref 40.0–52.0)
Hemoglobin: 14.9 g/dL (ref 13.0–18.0)
Lymphocytes Relative: 25 %
Lymphs Abs: 1.7 10*3/uL (ref 1.0–3.6)
MCH: 35.5 pg — ABNORMAL HIGH (ref 26.0–34.0)
MCHC: 36 g/dL (ref 32.0–36.0)
MCV: 98.6 fL (ref 80.0–100.0)
Monocytes Absolute: 0.9 10*3/uL (ref 0.2–1.0)
Monocytes Relative: 13 %
Neutro Abs: 4 10*3/uL (ref 1.4–6.5)
Neutrophils Relative %: 59 %
Platelets: 208 10*3/uL (ref 150–440)
RBC: 4.21 MIL/uL — ABNORMAL LOW (ref 4.40–5.90)
RDW: 14.2 % (ref 11.5–14.5)
WBC: 6.7 10*3/uL (ref 3.8–10.6)

## 2018-01-26 LAB — LIPID PANEL
Cholesterol: 193 mg/dL (ref 0–200)
HDL: 42 mg/dL (ref >40)
LDL Cholesterol: 131 mg/dL — ABNORMAL HIGH (ref 0–99)
Total CHOL/HDL Ratio: 4.6 RATIO
Triglycerides: 99 mg/dL (ref <150)
VLDL: 20 mg/dL (ref 0–40)

## 2018-01-26 LAB — COMPREHENSIVE METABOLIC PANEL
ALT: 13 U/L (ref 0–44)
AST: 19 U/L (ref 15–41)
Albumin: 4.1 g/dL (ref 3.5–5.0)
Alkaline Phosphatase: 97 U/L (ref 38–126)
Anion gap: 7 (ref 5–15)
BUN: 13 mg/dL (ref 6–20)
CO2: 25 mmol/L (ref 22–32)
Calcium: 9.2 mg/dL (ref 8.9–10.3)
Chloride: 109 mmol/L (ref 98–111)
Creatinine, Ser: 0.97 mg/dL (ref 0.61–1.24)
GFR calc Af Amer: >60 mL/min (ref >60)
GFR calc non Af Amer: >60 mL/min (ref >60)
Glucose, Bld: 98 mg/dL (ref 70–99)
Potassium: 4.2 mmol/L (ref 3.5–5.1)
Sodium: 141 mmol/L (ref 135–145)
Total Bilirubin: 0.8 mg/dL (ref 0.3–1.2)
Total Protein: 7.1 g/dL (ref 6.5–8.1)

## 2018-01-26 LAB — PROTIME-INR
INR: 1.01
Prothrombin Time: 13.2 seconds (ref 11.4–15.2)

## 2018-01-26 LAB — TROPONIN I: Troponin I: <0.03 ng/mL (ref <0.03)

## 2018-01-26 LAB — HEPARIN LEVEL (UNFRACTIONATED): Heparin Unfractionated: 0.21 IU/mL — ABNORMAL LOW (ref 0.30–0.70)

## 2018-01-26 LAB — APTT: aPTT: 130 seconds — ABNORMAL HIGH (ref 24–36)

## 2018-01-26 LAB — MRSA PCR SCREENING: MRSA by PCR: NEGATIVE

## 2018-01-26 MED ORDER — HEPARIN SODIUM (PORCINE) 5000 UNIT/ML IJ SOLN: 5000.0000 [IU] | Freq: Three times a day (TID) | INTRAMUSCULAR | Status: DC

## 2018-01-26 MED ORDER — NITROGLYCERIN 0.4 MG SL SUBL
0.4000 mg | SUBLINGUAL_TABLET | Freq: Once | SUBLINGUAL | Status: AC
  Administered 2018-01-26: 0.4 mg via SUBLINGUAL
  Filled 2018-01-26: qty 1

## 2018-01-26 MED ORDER — HEPARIN (PORCINE) IN NACL 100-0.45 UNIT/ML-% IJ SOLN
1050.0000 [IU]/h | INTRAMUSCULAR | Status: DC
  Administered 2018-01-26: 1050 [IU]/h via INTRAVENOUS
  Filled 2018-01-26 (×2): qty 250

## 2018-01-26 MED ORDER — CARVEDILOL 12.5 MG PO TABS
12.5000 mg | ORAL_TABLET | Freq: Two times a day (BID) | ORAL | Status: DC
  Administered 2018-01-27 – 2018-01-28 (×2): 12.5 mg via ORAL
  Filled 2018-01-26: qty 1
  Filled 2018-01-26: qty 4
  Filled 2018-01-26: qty 1

## 2018-01-26 MED ORDER — ASPIRIN 81 MG PO CHEW: 324.0000 mg | CHEWABLE_TABLET | Freq: Once | ORAL | Status: DC

## 2018-01-26 MED ORDER — MORPHINE SULFATE (PF) 4 MG/ML IV SOLN
4.0000 mg | Freq: Once | INTRAVENOUS | Status: AC
  Administered 2018-01-26: 4 mg via INTRAVENOUS
  Filled 2018-01-26: qty 1

## 2018-01-26 MED ORDER — SODIUM CHLORIDE 0.9 % WEIGHT BASED INFUSION
1.0000 mL/kg/h | INTRAVENOUS | Status: DC
  Administered 2018-01-27: 1 mL/kg/h via INTRAVENOUS

## 2018-01-26 MED ORDER — SODIUM CHLORIDE 0.9 % IV SOLN: 250.0000 mL | INTRAVENOUS | Status: DC | PRN

## 2018-01-26 MED ORDER — ATORVASTATIN CALCIUM 80 MG PO TABS
80.0000 mg | ORAL_TABLET | Freq: Every day | ORAL | Status: DC
  Administered 2018-01-27: 80 mg via ORAL
  Filled 2018-01-26: qty 1

## 2018-01-26 MED ORDER — NITROGLYCERIN 0.4 MG SL SUBL: 0.4000 mg | SUBLINGUAL_TABLET | SUBLINGUAL | Status: DC | PRN

## 2018-01-26 MED ORDER — HEPARIN (PORCINE) IN NACL 100-0.45 UNIT/ML-% IJ SOLN
1300.0000 [IU]/h | INTRAMUSCULAR | Status: DC
  Filled 2018-01-26: qty 250

## 2018-01-26 MED ORDER — ASPIRIN EC 81 MG PO TBEC
81.0000 mg | DELAYED_RELEASE_TABLET | Freq: Every day | ORAL | Status: DC
  Administered 2018-01-26: 81 mg via ORAL
  Filled 2018-01-26: qty 1

## 2018-01-26 MED ORDER — AMLODIPINE BESYLATE 5 MG PO TABS
5.0000 mg | ORAL_TABLET | Freq: Every day | ORAL | Status: DC
  Administered 2018-01-26 – 2018-01-28 (×3): 5 mg via ORAL
  Filled 2018-01-26 (×3): qty 1

## 2018-01-26 MED ORDER — SODIUM CHLORIDE 0.9% FLUSH
3.0000 mL | Freq: Two times a day (BID) | INTRAVENOUS | Status: DC
  Administered 2018-01-26 – 2018-01-27 (×2): 3 mL via INTRAVENOUS

## 2018-01-26 MED ORDER — ASPIRIN EC 81 MG PO TBEC: 81.0000 mg | DELAYED_RELEASE_TABLET | Freq: Every day | ORAL | Status: DC

## 2018-01-26 MED ORDER — SODIUM CHLORIDE 0.9% FLUSH: 3.0000 mL | INTRAVENOUS | Status: DC | PRN

## 2018-01-26 MED ORDER — HEPARIN SODIUM (PORCINE) 5000 UNIT/ML IJ SOLN
4000.0000 [IU] | Freq: Once | INTRAMUSCULAR | Status: AC
  Administered 2018-01-26: 4000 [IU] via INTRAVENOUS

## 2018-01-26 MED ORDER — ONDANSETRON HCL 4 MG/2ML IJ SOLN: 4.0000 mg | Freq: Four times a day (QID) | INTRAMUSCULAR | Status: DC | PRN

## 2018-01-26 MED ORDER — SODIUM CHLORIDE 0.9 % IV SOLN
INTRAVENOUS | Status: DC
  Administered 2018-01-26: 20 mL/h via INTRAVENOUS

## 2018-01-26 MED ORDER — ENSURE ENLIVE PO LIQD: 237.0000 mL | Freq: Two times a day (BID) | ORAL | Status: DC

## 2018-01-26 MED ORDER — ASPIRIN 81 MG PO CHEW
81.0000 mg | CHEWABLE_TABLET | ORAL | Status: AC
  Administered 2018-01-27: 81 mg via ORAL
  Filled 2018-01-26: qty 1

## 2018-01-26 MED ORDER — SODIUM CHLORIDE 0.9 % WEIGHT BASED INFUSION
3.0000 mL/kg/h | INTRAVENOUS | Status: DC
  Administered 2018-01-27: 3 mL/kg/h via INTRAVENOUS

## 2018-01-26 MED ORDER — ACETAMINOPHEN 325 MG PO TABS
650.0000 mg | ORAL_TABLET | ORAL | Status: DC | PRN
  Filled 2018-01-26: qty 2

## 2018-01-26 MED ORDER — HYDRALAZINE HCL 50 MG PO TABS
25.0000 mg | ORAL_TABLET | Freq: Two times a day (BID) | ORAL | Status: DC
  Administered 2018-01-26 – 2018-01-28 (×4): 25 mg via ORAL
  Filled 2018-01-26 (×4): qty 1

## 2018-01-26 MED ORDER — METOPROLOL TARTRATE 12.5 MG HALF TABLET: 12.5000 mg | ORAL_TABLET | Freq: Two times a day (BID) | ORAL | Status: DC

## 2018-01-26 MED ORDER — NITROGLYCERIN IN D5W 200-5 MCG/ML-% IV SOLN
0.0000 ug/min | INTRAVENOUS | Status: DC
  Administered 2018-01-26: 5 ug/min via INTRAVENOUS
  Filled 2018-01-26: qty 250

## 2018-01-26 NOTE — ED Triage Notes (Signed)
Patient to ED via [facility] c/o chest pain. Per EMS patient reported left sided chest pain with radiation to back. ST elevation noted. EMS reported administering 324 mg ASA, 50 mcg of fentanyl, and nitro pace. Patient alert and oriented at this time x4.

## 2018-01-26 NOTE — H&P (Signed)
Cardiology Admission History and Physical:   Patient ID: Kenneth Morales MRN: [id number]; DOB: 28-Aug-1965   Admission date: 01/26/2018  Primary Care Provider: [facility] Primary Cardiologist: No primary care provider on file.  Primary Electrophysiologist:  None   Chief Complaint: Chest pain  Patient Profile:   Kenneth Morales is a 52 y.o. male with severe triple-vessel coronary artery disease  History of Present Illness:   Kenneth Morales is a 52 year old male with known coronary artery disease with prior non-ST elevation myocardial infarction in May 2013 in the setting of cocaine use which demonstrated a cath with 99% mid left circumflex stenosis with large thrombus burden.  PCI, bare-metal stent 4.0 x 15 mm vision, placement to mid circumflex with residual LAD of 20%, RCA 30% and ejection fraction of 60% who was transferred from [facility] emergency department for evaluation of chest pain.  He then returned to the hospital in August 2019 with a non-ST elevation myocardial infarction with cardiac catheterization as described below. Dr. Park Breed perform last catheterization on 11/29/2017 which demonstrated an ostial first diagonal lesion of 65%, third marginal lesion of 100% stenosis in the proximal RCA to mid RCA lesion of 99% stenosis.  He had an unsuccessful attempt of his PCI and stent to mid RCA obtuse marginal 3 and diagonal 2.  Further intervention was deferred and patient was being treated medically.  He was placed on dual antiplatelet therapy with aspirin and clopidogrel for a minimum of 6 months.    His chest pain began approximately 30 minutes prior to his ER evaluation.  He began to have nausea, diaphoresis, shortness of breath.  This seems to be worse than the pain noted in August.  His hospital course was complicated during that stay with a retroperitoneal bleed status post cardiac catheterization with right groin pain and right lower quadrant pain.  Hemoglobin was  stable.  Pain control with oxycodone and he was allowed to go home.  Tobacco use was discussed.     Diagnostic Diagram         Troponin thus far is negative.  LDL 131.  He was transferred here to discuss the possibility of repeat cardiac catheterization with reattempt at PCI versus consultation for bypass surgery.  Currently he is resting comfortably in bed.  Feels better.  No shortness of breath.  On IV heparin drip.  Blood pressure is highly elevated.  Past Medical History:  Diagnosis Date  . Acute MI (HCC) MAY 2013  . CAD (coronary artery disease)    NSTEMI in 05/13 in setting of Cocaine use. Cath: 99% mid LCX stenosis with a large thrombus. PCI and BMS (4.0 X15 mm Vision) placement to mid LCX, LAD: 20%, RCA: 30%, EF: 60%.   . Cocaine abuse (HCC)    quit in 08/2011  . Depression   . Hyperlipidemia   . Hypertension   . Spider bite   . Tobacco abuse     Past Surgical History:  Procedure Laterality Date  . CARDIAC CATHETERIZATION  MAY 2013   s/p stent @ [facility]  . CARDIAC CATHETERIZATION    . CORONARY STENT INTERVENTION N/A 11/29/2017   Procedure: CORONARY STENT INTERVENTION;  Surgeon: Alwyn Pea, MD;  Location: [facility] INVASIVE CV LAB;  Service: Cardiovascular;  Laterality: N/A;  . HEMORRHOID SURGERY    . LEFT HEART CATH AND CORONARY ANGIOGRAPHY Right 11/29/2017   Procedure: Left heart catheterization with possible PCI;  Surgeon: Laurier Nancy, MD;  Location: [facility] INVASIVE CV LAB;  Service: Cardiovascular;  Laterality: Right;     Medications Prior to Admission: Prior to Admission medications   Medication Sig Start Date End Date Taking? Authorizing Provider  aspirin 81 MG tablet Take 1 tablet (81 mg total) by mouth daily. Patient not taking: Reported on 01/26/2018 07/02/16   Enid Baas, MD  atorvastatin (LIPITOR) 80 MG tablet Take 1 tablet (80 mg total) by mouth daily at 6 PM. 11/30/17   Alford Highland, MD  clopidogrel (PLAVIX) 75 MG tablet Take 1 tablet (75 mg  total) by mouth daily. Patient not taking: Reported on 01/26/2018 11/30/17   Alford Highland, MD  hydrALAZINE (APRESOLINE) 25 MG tablet Take 25 mg by mouth 2 (two) times daily.    [provider]  isosorbide mononitrate (IMDUR) 30 MG 24 hr tablet Take 1 tablet (30 mg total) by mouth daily. Patient not taking: Reported on 01/26/2018 11/30/17   Alford Highland, MD  lisinopril (PRINIVIL,ZESTRIL) 10 MG tablet Take 1 tablet (10 mg total) by mouth daily. Patient not taking: Reported on 01/26/2018 11/30/17   Alford Highland, MD  metoprolol tartrate (LOPRESSOR) 25 MG tablet Take 0.5 tablets (12.5 mg total) by mouth 2 (two) times daily. Patient taking differently: Take 25 mg by mouth daily.  11/30/17   Alford Highland, MD     Allergies:    Allergies  Allergen Reactions  . Penicillins Rash    Has patient had a PCN reaction causing immediate rash, facial/tongue/throat swelling, SOB or lightheadedness with hypotension: Yes Has patient had a PCN reaction causing severe rash involving mucus membranes or skin necrosis: No Has patient had a PCN reaction that required hospitalization: No Has patient had a PCN reaction occurring within the last 10 years: No If all of the above answers are "NO", then may proceed with Cephalosporin use.    Social History:   Social History   Socioeconomic History  . Marital status: Single    Spouse name: Not on file  . Number of children: Not on file  . Years of education: Not on file  . Highest education level: Not on file  Occupational History  . Not on file  Social Needs  . Financial resource strain: Not on file  . Food insecurity:    Worry: Not on file    Inability: Not on file  . Transportation needs:    Medical: Not on file    Non-medical: Not on file  Tobacco Use  . Smoking status: Current Every Day Smoker    Packs/day: 0.50    Years: 30.00    Pack years: 15.00    Types: Cigarettes  . Smokeless tobacco: Never Used  Substance and Sexual Activity    . Alcohol use: Yes    Alcohol/week: 6.0 standard drinks    Types: 6 Cans of beer per week    Comment: weekly  . Drug use: No    Types: Cocaine    Comment: used cocaine 15-20 years  . Sexual activity: Yes    Birth control/protection: Other-see comments  Lifestyle  . Physical activity:    Days per week: Not on file    Minutes per session: Not on file  . Stress: Not on file  Relationships  . Social connections:    Talks on phone: Not on file    Gets together: Not on file    Attends religious service: Not on file    Active member of club or organization: Not on file    Attends meetings of clubs or organizations: Not on  file    Relationship status: Not on file  . Intimate partner violence:    Fear of current or ex partner: Not on file    Emotionally abused: Not on file    Physically abused: Not on file    Forced sexual activity: Not on file  Other Topics Concern  . Not on file  Social History Narrative  . Not on file    Family History:   The patient's family history includes Heart attack in his maternal grandfather and paternal grandfather.    ROS:  Please see the history of present illness.  Currently denies any fevers chills bleeding orthopnea PND syncope  all other ROS reviewed and negative.     Physical Exam/Data:  There were no vitals filed for this visit. No intake or output data in the 24 hours ending 01/26/18 2026 There were no vitals filed for this visit. There is no height or weight on file to calculate BMI.  General:  Well nourished, well developed, in no acute distress HEENT: normal Lymph: no adenopathy Neck: no JVD Endocrine:  No thryomegaly Vascular: No carotid bruits; FA pulses 2+ bilaterally without bruits  Cardiac:  normal S1, S2; RRR; no murmur  Lungs:  clear to auscultation bilaterally, no wheezing, rhonchi or rales  Abd: soft, nontender, no hepatomegaly  Ext: no edema Musculoskeletal:  No deformities, BUE and BLE strength normal and equal Skin:  warm and dry  Neuro:  CNs 2-12 intact, no focal abnormalities noted Psych:  Normal affect    EKG:  The ECG that was done  was personally reviewed and demonstrates sinus rhythm with LVH, marked T wave inversion in the lateral precordial leads, ST segment elevation biphasic in the early precordial leads, ST segment depression in the inferior leads with T wave inversion as well.  This is an accentuation of his EKG pattern seen last on 11/30/2017.  Relevant CV Studies:  See heart catheterization as above.  Echocardiogram 11/29/2017: - Left ventricle: The cavity size was normal. There was severe   concentric hypertrophy. The estimated ejection fraction was 65%.   Wall motion was normal; there were no regional wall motion   abnormalities. Doppler parameters are consistent with abnormal   left ventricular relaxation (grade 1 diastolic dysfunction). - Aortic valve: There was moderate regurgitation. - Mitral valve: There was mild regurgitation. - Left atrium: The atrium was mildly dilated. - Right ventricle: The cavity size was mildly dilated.  Impressions:  - Normal LVEF, mild diastolic dysfunction, severe LVH. ModeratAR,   mild MR.  Laboratory Data:  Chemistry Recent Labs  Lab 01/26/18 1400  NA 141  K 4.2  CL 109  CO2 25  GLUCOSE 98  BUN 13  CREATININE 0.97  CALCIUM 9.2  GFRNONAA >60  GFRAA >60  ANIONGAP 7    Recent Labs  Lab 01/26/18 1400  PROT 7.1  ALBUMIN 4.1  AST 19  ALT 13  ALKPHOS 97  BILITOT 0.8   Hematology Recent Labs  Lab 01/26/18 1400  WBC 6.7  RBC 4.21*  HGB 14.9  HCT 41.5  MCV 98.6  MCH 35.5*  MCHC 36.0  RDW 14.2  PLT 208   Cardiac Enzymes Recent Labs  Lab 01/26/18 1400  TROPONINI <0.03   No results for input(s): TROPIPOC in the last 168 hours.  BNPNo results for input(s): BNP, PROBNP in the last 168 hours.  DDimer No results for input(s): DDIMER in the last 168 hours.  Radiology/Studies:  Dg Chest Port 1 View  Result  Date:  01/26/2018 CLINICAL DATA:  Chest pain EXAM: PORTABLE CHEST 1 VIEW COMPARISON:  11/28/2017 FINDINGS: Heart size and vascularity normal.  No effusion Mild left lower lobe atelectasis/infiltrate.  Right lung clear. IMPRESSION: Mild left lower lobe atelectasis/infiltrate. Electronically Signed   By: Marlan Palau M.D.   On: 01/26/2018 14:39    Assessment and Plan:   Acute coronary syndrome/unstable angina - Markedly abnormal ECG with accentuation especially in the inferior leads of ST segment depression with continued biphasic ST segment elevation in the anterior precordial leads. - Unable to successfully perform PCI in August, see cath note as above. - We will make him n.p.o. past midnight for repeat cardiac catheterization and perhaps attempt at PCI.  If interventional team feels as though lesions would be best treated with coronary artery bypass graft, consult cardiothoracic surgery tomorrow. - For now, I will hold off on clopidogrel 75 mg since he has not been taking this medication reportedly but continue with aspirin.  There is a chance that he is going to potentially need surgery. -Continue with IV heparin.  Note, his last groin approach catheterization resulted in a retroperitoneal bleed, please see prior discharge summary.  Current hemoglobin 14.9.  CAD - Multivessel severe coronary artery disease with unsuccessful PCI attempt previously. - IV nitroglycerin -EF was normal. -Continue with high intensity statin  Prior cocaine use - Urine tox screen on 07/01/2016 was negative for cocaine.  Recent non-ST elevation myocardial infarction - Prior troponin peak was 3.39 on 11/29/2017.  Currently troponin is normal.  If it does become elevated, please start on IV heparin.  Tobacco abuse -Continue to encourage cessation  Hyperlipidemia -Statin, high intensity  Essential hypertension - I will go ahead and place him on carvedilol 12.5 mg twice a day to assist with his highly elevated blood  pressure.  I will also add amlodipine 5 mg once a day.  IV nitroglycerin as well.  He did have headache previously on isosorbide.  He has not been taking lisinopril at home.  He also has not been taking isosorbide because of headache.  Severity of Illness: The appropriate patient status for this patient is INPATIENT. Inpatient status is judged to be reasonable and necessary in order to provide the required intensity of service to ensure the patient's safety. The patient's presenting symptoms, physical exam findings, and initial radiographic and laboratory data in the context of their chronic comorbidities is felt to place them at high risk for further clinical deterioration. Furthermore, it is not anticipated that the patient will be medically stable for discharge from the hospital within 2 midnights of admission. The following factors support the patient status of inpatient.   " The patient's presenting symptoms include unstable angina. " The worrisome physical exam findings include chest pain. " The initial radiographic and laboratory data are worrisome because of markedly abnormal EKG. " The chronic co-morbidities include medical noncompliance, hypertension.   * I certify that at the point of admission it is my clinical judgment that the patient will require inpatient hospital care spanning beyond 2 midnights from the point of admission due to high intensity of service, high risk for further deterioration and high frequency of surveillance required.*    For questions or updates, please contact [facility] Please consult [email or web address] for contact info under        Signed, Donato Schultz, MD  01/26/2018 8:26 PM

## 2018-01-26 NOTE — ED Notes (Signed)
Patient resting comfortably at this time. A&Ox4. Family present at bedside.

## 2018-01-26 NOTE — Progress Notes (Signed)
ANTICOAGULATION CONSULT NOTE - Initial Consult  Pharmacy Consult for heparin Indication: chest pain/ACS  Allergies  Allergen Reactions  . Penicillins Rash    Has patient had a PCN reaction causing immediate rash, facial/tongue/throat swelling, SOB or lightheadedness with hypotension: Yes Has patient had a PCN reaction causing severe rash involving mucus membranes or skin necrosis: No Has patient had a PCN reaction that required hospitalization: No Has patient had a PCN reaction occurring within the last 10 years: No If all of the above answers are "NO", then may proceed with Cephalosporin use.    Patient Measurements: Weight: 160 lb 15 oz (73 kg) Heparin Dosing Weight: 73 kg  Vital Signs: Temp: 98 F (36.7 C) (10/03 1407) Temp Source: Oral (10/03 1407) BP: 161/104 (10/03 1407) Pulse Rate: 59 (10/03 1407)  Labs: No results for input(s): HGB, HCT, PLT, APTT, LABPROT, INR, HEPARINUNFRC, HEPRLOWMOCWT, CREATININE, CKTOTAL, CKMB, TROPONINI in the last 72 hours.  CrCl cannot be calculated (Patient's most recent lab result is older than the maximum 21 days allowed.).   Medical History: Past Medical History:  Diagnosis Date  . Acute MI (HCC) MAY 2013  . CAD (coronary artery disease)    NSTEMI in 05/13 in setting of Cocaine use. Cath: 99% mid LCX stenosis with a large thrombus. PCI and BMS (4.0 X15 mm Vision) placement to mid LCX, LAD: 20%, RCA: 30%, EF: 60%.   . Cocaine abuse (HCC)    quit in 08/2011  . Depression   . Hyperlipidemia   . Hypertension   . Spider bite   . Tobacco abuse     Medications:  Infusions:  . sodium chloride    . heparin      Assessment: 52 yom cc chest pain with positive EKG with ST elevation. No PTA OAC noted. Pharmacy consulted to manage heparin for ACS.  Goal of Therapy:  Heparin level 0.3-0.7 units/ml Monitor platelets by anticoagulation protocol: Yes   Plan:  Give 4000 units bolus x 1 Start heparin infusion at 1050 units/hr Check  anti-Xa level in 6 hours and daily while on heparin Continue to monitor H&H and platelets  Carola Frost, Pharm.D., BCPS Clinical Pharmacist 01/26/2018,2:12 PM

## 2018-01-26 NOTE — ED Provider Notes (Signed)
[facility] Emergency Department Provider Note       Time seen: ----------------------------------------- 1:57 PM on 01/26/2018 -----------------------------------------   I have reviewed the triage vital signs and the nursing notes.  HISTORY   Chief Complaint No chief complaint on file.    HPI Kenneth Morales is a 52 y.o. male with a history of acute MI, coronary artery disease, NSTEMI, cocaine abuse, hyperlipidemia, hypertension who presents to the ED for chest pain that started about 30 minutes ago.  Patient states he had sweats, nausea and shortness of breath.  Patient reports this pain is worse than when he was here in August and had a left heart cath.  Reportedly at that time he had a 90% blockage they were treating medically.  He states that the pain is improved slightly.  Past Medical History:  Diagnosis Date  . Acute MI (HCC) MAY 2013  . CAD (coronary artery disease)    NSTEMI in 05/13 in setting of Cocaine use. Cath: 99% mid LCX stenosis with a large thrombus. PCI and BMS (4.0 X15 mm Vision) placement to mid LCX, LAD: 20%, RCA: 30%, EF: 60%.   . Cocaine abuse (HCC)    quit in 08/2011  . Depression   . Hyperlipidemia   . Hypertension   . Spider bite   . Tobacco abuse     Patient Active Problem List   Diagnosis Date Noted  . NSTEMI (non-ST elevated myocardial infarction) (HCC) 11/28/2017  . 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 Chest pain 07/01/2016  . SOB (shortness of breath) 12/10/2011  . CAD (coronary artery disease)   . Hyperlipidemia   . Hypertension   . Tobacco abuse   . Cocaine abuse [facility])     Past Surgical History:  Procedure Laterality Date  . CARDIAC CATHETERIZATION  MAY 2013   s/p stent @ [facility]  . CARDIAC CATHETERIZATION    . CORONARY STENT INTERVENTION N/A 11/29/2017   Procedure: CORONARY STENT INTERVENTION;  Surgeon: Alwyn Pea, MD;  Location: [facility] INVASIVE CV LAB;  Service: Cardiovascular;  Laterality:  N/A;  . HEMORRHOID SURGERY    . LEFT HEART CATH AND CORONARY ANGIOGRAPHY Right 11/29/2017   Procedure: Left heart catheterization with possible PCI;  Surgeon: Laurier Nancy, MD;  Location: [facility] INVASIVE CV LAB;  Service: Cardiovascular;  Laterality: Right;    Allergies Penicillins  Social History Social History   Tobacco Use  . Smoking status: Current Every Day Smoker    Packs/day: 0.50    Years: 30.00    Pack years: 15.00    Types: Cigarettes  . Smokeless tobacco: Never Used  Substance Use Topics  . Alcohol use: Yes    Alcohol/week: 6.0 standard drinks    Types: 6 Cans of beer per week    Comment: weekly  . Drug use: No    Types: Cocaine    Comment: used cocaine 15-20 years   Review of Systems Constitutional: Negative for fever. Cardiovascular: Positive for chest pain Respiratory: Positive for shortness of breath Gastrointestinal: Negative for abdominal pain, positive for nausea Musculoskeletal: Negative for back pain. Skin: Positive for diaphoresis Neurological: Negative for headaches, focal weakness or numbness.  All systems negative/normal/unremarkable except as stated in the HPI  ____________________________________________   PHYSICAL EXAM:  VITAL SIGNS: ED Triage Vitals  Enc Vitals Group     BP      Pulse      Resp      Temp      Temp src  SpO2      Weight      Height      Head Circumference      Peak Flow      Pain Score      Pain Loc      Pain Edu?      Excl. in GC?    Constitutional: Alert and oriented. Well appearing and in no distress. Eyes: Conjunctivae are normal. Normal extraocular movements. ENT   Head: Normocephalic and atraumatic.   Nose: No congestion/rhinnorhea.   Mouth/Throat: Mucous membranes are moist.   Neck: No stridor. Cardiovascular: Normal rate, regular rhythm. No murmurs, rubs, or gallops. Respiratory: Normal respiratory effort without tachypnea nor retractions. Breath sounds are clear and equal  bilaterally. No wheezes/rales/rhonchi. Gastrointestinal: Soft and nontender. Normal bowel sounds Musculoskeletal: Nontender with normal range of motion in extremities. No lower extremity tenderness nor edema. Neurologic:  Normal speech and language. No gross focal neurologic deficits are appreciated.  Skin:  Skin is warm, dry and intact. No rash noted. Psychiatric: Mood and affect are normal. Speech and behavior are normal.  ____________________________________________  EKG: Interpreted by me.  Sinus rhythm the rate of 62 bpm, likely LVH with repolarization abnormality, anterior ST elevation possibly due to LVH  ____________________________________________  ED COURSE:  As part of my medical decision making, I reviewed the following data within the electronic MEDICAL RECORD NUMBER History obtained from family if available, nursing notes, old chart and ekg, as well as notes from prior ED visits. Patient presented for chest pain, we will assess with labs and imaging as indicated at this time.   Procedures ____________________________________________   LABS (pertinent positives/negatives)  Labs Reviewed  CBC WITH DIFFERENTIAL/PLATELET - Abnormal; Notable for the following components:      Result Value   RBC 4.21 (*)    MCH 35.5 (*)    All other components within normal limits  LIPID PANEL - Abnormal; Notable for the following components:   LDL Cholesterol 131 (*)    All other components within normal limits  COMPREHENSIVE METABOLIC PANEL  TROPONIN I  URINE DRUG SCREEN, QUALITATIVE ([facility] ONLY)  HEPARIN LEVEL (UNFRACTIONATED)  PROTIME-INR  APTT   CRITICAL CARE Performed by: Ulice Dash   Total critical care time: 30 minutes  Critical care time was exclusive of separately billable procedures and treating other patients.  Critical care was necessary to treat or prevent imminent or life-threatening deterioration.  Critical care was time spent personally by me on the  following activities: development of treatment plan with patient and/or surrogate as well as nursing, discussions with consultants, evaluation of patient's response to treatment, examination of patient, obtaining history from patient or surrogate, ordering and performing treatments and interventions, ordering and review of laboratory studies, ordering and review of radiographic studies, pulse oximetry and re-evaluation of patient's condition.  RADIOLOGY Images were viewed by me  Chest x-ray IMPRESSION: Mild left lower lobe atelectasis/infiltrate. ____________________________________________  DIFFERENTIAL DIAGNOSIS   STEMI, NSTEMI, cocaine abuse, unstable angina  FINAL ASSESSMENT AND PLAN  Chest pain, unstable angina   Plan: The patient had presented for sudden onset chest pain approximately 30 minutes ago. Patient's labs did not reveal any acute process. Patient's imaging did reveal left lower lobe atelectasis or infiltrate.  He does not have any symptoms of pneumonia.  I discussed with cardiology who recommended transfer to a tertiary care center who can provide bypass surgery if needed.  Patient is agreeable to this plan.  We do have him on heparin drip  and currently his pain is improved.   Ulice Dash, MD   Note: This note was generated in part or whole with voice recognition software. Voice recognition is usually quite accurate but there are transcription errors that can and very often do occur. I apologize for any typographical errors that were not detected and corrected.     Emily Filbert, MD 01/26/18 [phone]

## 2018-01-26 NOTE — ED Provider Notes (Signed)
I discussed with Dr. Delton See on-call at [facility] who accepted the patient in transfer for possible re-cath or bypass surgery.  Patient is understanding of this.   Emily Filbert, MD 01/26/18 [phone]

## 2018-01-27 ENCOUNTER — Inpatient Hospital Stay (HOSPITAL_COMMUNITY)
Admission: AD | Disposition: A | Discharge status: Home / Self Care | Payer: Self-pay | Source: Other Acute Inpatient Hospital | Attending: Cardiology

## 2018-01-27 DIAGNOSIS — I25110 Atherosclerotic heart disease of native coronary artery with unstable angina pectoris: Secondary | ICD-10-CM

## 2018-01-27 DIAGNOSIS — E876 Hypokalemia: Secondary | ICD-10-CM

## 2018-01-27 HISTORY — PX: LEFT HEART CATH AND CORONARY ANGIOGRAPHY: CATH118249

## 2018-01-27 HISTORY — PX: CORONARY CTO INTERVENTION: CATH118236

## 2018-01-27 HISTORY — PX: CORONARY STENT INTERVENTION: CATH118234

## 2018-01-27 LAB — LIPID PANEL
Cholesterol: 159 mg/dL (ref 0–200)
HDL: 38 mg/dL — ABNORMAL LOW (ref >40)
LDL Cholesterol: 102 mg/dL — ABNORMAL HIGH (ref 0–99)
Total CHOL/HDL Ratio: 4.2 RATIO
Triglycerides: 97 mg/dL (ref <150)
VLDL: 19 mg/dL (ref 0–40)

## 2018-01-27 LAB — RAPID URINE DRUG SCREEN, HOSP PERFORMED
Amphetamines: NOT DETECTED
Barbiturates: NOT DETECTED
Benzodiazepines: NOT DETECTED
Cocaine: POSITIVE — AB
Opiates: POSITIVE — AB
Tetrahydrocannabinol: NOT DETECTED

## 2018-01-27 LAB — CBC
HCT: 43.4 % (ref 39.0–52.0)
HCT: 37.9 % — ABNORMAL LOW (ref 39.0–52.0)
Hemoglobin: 14.7 g/dL (ref 13.0–17.0)
Hemoglobin: 12.9 g/dL — ABNORMAL LOW (ref 13.0–17.0)
MCH: 32.9 pg (ref 26.0–34.0)
MCH: 32.9 pg (ref 26.0–34.0)
MCHC: 33.9 g/dL (ref 30.0–36.0)
MCHC: 34 g/dL (ref 30.0–36.0)
MCV: 97.1 fL (ref 78.0–100.0)
MCV: 96.7 fL (ref 78.0–100.0)
Platelets: 220 10*3/uL (ref 150–400)
Platelets: 210 10*3/uL (ref 150–400)
RBC: 4.47 MIL/uL (ref 4.22–5.81)
RBC: 3.92 MIL/uL — ABNORMAL LOW (ref 4.22–5.81)
RDW: 13.6 % (ref 11.5–15.5)
RDW: 13.6 % (ref 11.5–15.5)
WBC: 7.2 10*3/uL (ref 4.0–10.5)
WBC: 7.2 10*3/uL (ref 4.0–10.5)

## 2018-01-27 LAB — BASIC METABOLIC PANEL
Anion gap: 7 (ref 5–15)
BUN: 11 mg/dL (ref 6–20)
CO2: 23 mmol/L (ref 22–32)
Calcium: 8.6 mg/dL — ABNORMAL LOW (ref 8.9–10.3)
Chloride: 108 mmol/L (ref 98–111)
Creatinine, Ser: 0.88 mg/dL (ref 0.61–1.24)
GFR calc Af Amer: >60 mL/min (ref >60)
GFR calc non Af Amer: >60 mL/min (ref >60)
Glucose, Bld: 90 mg/dL (ref 70–99)
Potassium: 3.3 mmol/L — ABNORMAL LOW (ref 3.5–5.1)
Sodium: 138 mmol/L (ref 135–145)

## 2018-01-27 LAB — CREATININE, SERUM
Creatinine, Ser: 0.96 mg/dL (ref 0.61–1.24)
GFR calc Af Amer: >60 mL/min (ref >60)
GFR calc non Af Amer: >60 mL/min (ref >60)

## 2018-01-27 LAB — POCT ACTIVATED CLOTTING TIME
Activated Clotting Time: 252 seconds
Activated Clotting Time: 301 seconds
Activated Clotting Time: 367 seconds

## 2018-01-27 LAB — TROPONIN I: Troponin I: <0.03 ng/mL (ref <0.03)

## 2018-01-27 LAB — HEPARIN LEVEL (UNFRACTIONATED): Heparin Unfractionated: 0.46 IU/mL (ref 0.30–0.70)

## 2018-01-27 SURGERY — LEFT HEART CATH AND CORONARY ANGIOGRAPHY
Anesthesia: LOCAL

## 2018-01-27 MED ORDER — MIDAZOLAM HCL 2 MG/2ML IJ SOLN
INTRAMUSCULAR | Status: AC
  Filled 2018-01-27: qty 2

## 2018-01-27 MED ORDER — HEPARIN (PORCINE) IN NACL 1000-0.9 UT/500ML-% IV SOLN
INTRAVENOUS | Status: DC | PRN
  Administered 2018-01-27: 500 mL

## 2018-01-27 MED ORDER — FENTANYL CITRATE (PF) 100 MCG/2ML IJ SOLN
INTRAMUSCULAR | Status: DC | PRN
  Administered 2018-01-27 (×3): 25 ug via INTRAVENOUS

## 2018-01-27 MED ORDER — ONDANSETRON HCL 4 MG/2ML IJ SOLN: 4.0000 mg | Freq: Four times a day (QID) | INTRAMUSCULAR | Status: DC | PRN

## 2018-01-27 MED ORDER — IOHEXOL 350 MG/ML SOLN
INTRAVENOUS | Status: DC | PRN
  Administered 2018-01-27: 155 mL via INTRA_ARTERIAL

## 2018-01-27 MED ORDER — LIDOCAINE HCL (PF) 1 % IJ SOLN
INTRAMUSCULAR | Status: AC
  Filled 2018-01-27: qty 30

## 2018-01-27 MED ORDER — TIROFIBAN HCL IN NACL 5-0.9 MG/100ML-% IV SOLN
0.1500 ug/kg/min | INTRAVENOUS | Status: AC
  Filled 2018-01-27: qty 100

## 2018-01-27 MED ORDER — NITROGLYCERIN 0.4 MG SL SUBL: 0.4000 mg | SUBLINGUAL_TABLET | SUBLINGUAL | Status: DC

## 2018-01-27 MED ORDER — HEPARIN (PORCINE) IN NACL 1000-0.9 UT/500ML-% IV SOLN
INTRAVENOUS | Status: AC
  Filled 2018-01-27: qty 500

## 2018-01-27 MED ORDER — LABETALOL HCL 5 MG/ML IV SOLN: 10.0000 mg | INTRAVENOUS | Status: AC | PRN

## 2018-01-27 MED ORDER — TIROFIBAN HCL IN NACL 5-0.9 MG/100ML-% IV SOLN
INTRAVENOUS | Status: AC
  Filled 2018-01-27: qty 100

## 2018-01-27 MED ORDER — SODIUM CHLORIDE 0.9 % IV SOLN: INTRAVENOUS | Status: AC

## 2018-01-27 MED ORDER — TICAGRELOR 90 MG PO TABS
ORAL_TABLET | ORAL | Status: AC
  Filled 2018-01-27: qty 2

## 2018-01-27 MED ORDER — TIROFIBAN (AGGRASTAT) BOLUS VIA INFUSION
INTRAVENOUS | Status: DC | PRN
  Administered 2018-01-27: 1707.5 ug via INTRAVENOUS

## 2018-01-27 MED ORDER — ACETAMINOPHEN 325 MG PO TABS: 650.0000 mg | ORAL_TABLET | ORAL | Status: DC | PRN

## 2018-01-27 MED ORDER — ASPIRIN 81 MG PO CHEW
81.0000 mg | CHEWABLE_TABLET | Freq: Every day | ORAL | Status: DC
  Administered 2018-01-28: 81 mg via ORAL
  Filled 2018-01-27: qty 1

## 2018-01-27 MED ORDER — TICAGRELOR 90 MG PO TABS
ORAL_TABLET | ORAL | Status: DC | PRN
  Administered 2018-01-27: 180 mg via ORAL

## 2018-01-27 MED ORDER — ENSURE ENLIVE PO LIQD: 237.0000 mL | Freq: Every day | ORAL | Status: DC

## 2018-01-27 MED ORDER — TRAZODONE HCL 50 MG PO TABS
25.0000 mg | ORAL_TABLET | Freq: Every evening | ORAL | Status: DC | PRN
  Administered 2018-01-27: 25 mg via ORAL
  Filled 2018-01-27: qty 1

## 2018-01-27 MED ORDER — HEPARIN SODIUM (PORCINE) 1000 UNIT/ML IJ SOLN
INTRAMUSCULAR | Status: DC | PRN
  Administered 2018-01-27: 3000 [IU] via INTRAVENOUS
  Administered 2018-01-27: 6000 [IU] via INTRAVENOUS
  Administered 2018-01-27: 2000 [IU] via INTRAVENOUS
  Administered 2018-01-27: 3500 [IU] via INTRAVENOUS

## 2018-01-27 MED ORDER — HEPARIN SODIUM (PORCINE) 1000 UNIT/ML IJ SOLN
INTRAMUSCULAR | Status: AC
  Filled 2018-01-27: qty 1

## 2018-01-27 MED ORDER — POTASSIUM CHLORIDE CRYS ER 20 MEQ PO TBCR
40.0000 meq | EXTENDED_RELEASE_TABLET | Freq: Once | ORAL | Status: AC
  Administered 2018-01-27: 40 meq via ORAL
  Filled 2018-01-27: qty 2

## 2018-01-27 MED ORDER — VERAPAMIL HCL 2.5 MG/ML IV SOLN
INTRAVENOUS | Status: AC
  Filled 2018-01-27: qty 2

## 2018-01-27 MED ORDER — TIROFIBAN HCL IN NACL 5-0.9 MG/100ML-% IV SOLN
INTRAVENOUS | Status: AC | PRN
  Administered 2018-01-27: 0.15 ug/kg/min via INTRAVENOUS

## 2018-01-27 MED ORDER — NITROGLYCERIN 1 MG/10 ML FOR IR/CATH LAB
INTRA_ARTERIAL | Status: AC
  Filled 2018-01-27: qty 10

## 2018-01-27 MED ORDER — MIDAZOLAM HCL 2 MG/2ML IJ SOLN
INTRAMUSCULAR | Status: DC | PRN
  Administered 2018-01-27: 1 mg via INTRAVENOUS
  Administered 2018-01-27: 2 mg via INTRAVENOUS
  Administered 2018-01-27: 1 mg via INTRAVENOUS

## 2018-01-27 MED ORDER — LIDOCAINE HCL (PF) 1 % IJ SOLN
INTRAMUSCULAR | Status: DC | PRN
  Administered 2018-01-27: 2 mL

## 2018-01-27 MED ORDER — TICAGRELOR 90 MG PO TABS
90.0000 mg | ORAL_TABLET | Freq: Two times a day (BID) | ORAL | Status: DC
  Filled 2018-01-27: qty 1

## 2018-01-27 MED ORDER — SODIUM CHLORIDE 0.9% FLUSH: 3.0000 mL | Freq: Two times a day (BID) | INTRAVENOUS | Status: DC

## 2018-01-27 MED ORDER — SODIUM CHLORIDE 0.9% FLUSH: 3.0000 mL | INTRAVENOUS | Status: DC | PRN

## 2018-01-27 MED ORDER — FENTANYL CITRATE (PF) 100 MCG/2ML IJ SOLN
INTRAMUSCULAR | Status: AC
  Filled 2018-01-27: qty 2

## 2018-01-27 MED ORDER — SODIUM CHLORIDE 0.9 % IV SOLN: 250.0000 mL | INTRAVENOUS | Status: DC | PRN

## 2018-01-27 MED ORDER — VERAPAMIL HCL 2.5 MG/ML IV SOLN
INTRAVENOUS | Status: DC | PRN
  Administered 2018-01-27: 10 mL via INTRA_ARTERIAL

## 2018-01-27 MED ORDER — HYDRALAZINE HCL 20 MG/ML IJ SOLN: 5.0000 mg | INTRAMUSCULAR | Status: AC | PRN

## 2018-01-27 MED ORDER — ADULT MULTIVITAMIN W/MINERALS CH
1.0000 | ORAL_TABLET | Freq: Every day | ORAL | Status: DC
  Administered 2018-01-28: 1 via ORAL
  Filled 2018-01-27: qty 1

## 2018-01-27 SURGICAL SUPPLY — 23 items
BALLN SAPPHIRE 1.5X15 (BALLOONS) ×2
BALLN SAPPHIRE 2.0X15 (BALLOONS) ×4
BALLN SAPPHIRE 2.75X15 (BALLOONS) ×2
BALLN SAPPHIRE ~~LOC~~ 3.75X8 (BALLOONS) ×1 IMPLANT
BALLOON SAPPHIRE 1.5X15 (BALLOONS) IMPLANT
BALLOON SAPPHIRE 2.0X15 (BALLOONS) IMPLANT
BALLOON SAPPHIRE 2.75X15 (BALLOONS) IMPLANT
CATH 5FR JL3.5 JR4 ANG PIG MP (CATHETERS) ×1 IMPLANT
CATH LAUNCHER 6FR EBU 3.75 (CATHETERS) ×1 IMPLANT
CATH LAUNCHER 6FR EBU3.5 (CATHETERS) ×1 IMPLANT
DEVICE RAD COMP TR BAND LRG (VASCULAR PRODUCTS) ×1 IMPLANT
GLIDESHEATH SLEND SS 6F .021 (SHEATH) ×1 IMPLANT
GUIDEWIRE INQWIRE 1.5J.035X260 (WIRE) IMPLANT
INQWIRE 1.5J .035X260CM (WIRE) ×2
KIT ENCORE 26 ADVANTAGE (KITS) ×1 IMPLANT
KIT HEART LEFT (KITS) ×2 IMPLANT
KIT HEMO VALVE WATCHDOG (MISCELLANEOUS) ×1 IMPLANT
PACK CARDIAC CATHETERIZATION (CUSTOM PROCEDURE TRAY) ×2 IMPLANT
STENT SYNERGY DES 3X16 (Permanent Stent) ×1 IMPLANT
TRANSDUCER W/STOPCOCK (MISCELLANEOUS) ×2 IMPLANT
TUBING CIL FLEX 10 FLL-RA (TUBING) ×2 IMPLANT
WIRE ASAHI PROWATER 180CM (WIRE) ×1 IMPLANT
WIRE FIGHTER CROSSING 190CM (WIRE) ×1 IMPLANT

## 2018-01-27 NOTE — Progress Notes (Addendum)
 Progress Note  Patient Name: Kenneth Morales Date of Encounter: 01/27/2018  Primary Cardiologist: KHAN,SHAUKAT A, MD  Subjective   Feelig okay this morning. Denies chest pain, SOB, or headache.  Inpatient Medications    Scheduled Meds: . amLODipine  5 mg Oral Daily  . [START ON 01/28/2018] aspirin EC  81 mg Oral Daily  . atorvastatin  80 mg Oral q1800  . carvedilol  12.5 mg Oral BID WC  . feeding supplement (ENSURE ENLIVE)  237 mL Oral BID BM  . hydrALAZINE  25 mg Oral BID  . sodium chloride flush  3 mL Intravenous Q12H   Continuous Infusions: . sodium chloride    . sodium chloride 1 mL/kg/hr (01/27/18 0517)  . heparin 1,300 Units/hr (01/27/18 0008)  . nitroGLYCERIN 5 mcg/min (01/27/18 0000)   PRN Meds: sodium chloride, acetaminophen, nitroGLYCERIN, ondansetron (ZOFRAN) IV, sodium chloride flush   Vital Signs    Vitals:   01/27/18 0003 01/27/18 0436 01/27/18 0610 01/27/18 0751  BP: 118/75 127/81  (!) 132/93  Pulse: 81 70  62  Resp: 19 11  15  Temp: 98.1 F (36.7 C) 98 F (36.7 C)    TempSrc: Oral Oral    SpO2: 94% 94%  96%  Weight:   68.3 kg   Height:        Intake/Output Summary (Last 24 hours) at 01/27/2018 1025 Last data filed at 01/27/2018 0900 Gross per 24 hour  Intake 526.49 ml  Output 275 ml  Net 251.49 ml   Filed Weights   01/26/18 2137 01/27/18 0610  Weight: 68.3 kg 68.3 kg    Telemetry    NSR - Personally Reviewed  ECG    NSR, ST-elevation in precordial leads, ST depression inferior leads, ST-changes consistent with LVH / early repolarization - Personally Reviewed  Physical Exam   GEN: No acute distress.   Neck: No JVD Cardiac: RRR, no murmurs, rubs, or gallops.  Respiratory: Clear to auscultation bilaterally. GI: Soft, nontender, non-distended  MS: No edema; No deformity. Neuro:  Nonfocal  Psych: Normal affect   Labs    Chemistry Recent Labs  Lab 01/26/18 1400 01/26/18 2150 01/27/18 0601  NA 141  --  138  K 4.2  --   3.3*  CL 109  --  108  CO2 25  --  23  GLUCOSE 98  --  90  BUN 13  --  11  CREATININE 0.97 0.96 0.88  CALCIUM 9.2  --  8.6*  PROT 7.1  --   --   ALBUMIN 4.1  --   --   AST 19  --   --   ALT 13  --   --   ALKPHOS 97  --   --   BILITOT 0.8  --   --   GFRNONAA >60 >60 >60  GFRAA >60 >60 >60  ANIONGAP 7  --  7     Hematology Recent Labs  Lab 01/26/18 1400 01/26/18 2150 01/27/18 0601  WBC 6.7 7.2 7.2  RBC 4.21* 4.47 3.92*  HGB 14.9 14.7 12.9*  HCT 41.5 43.4 37.9*  MCV 98.6 97.1 96.7  MCH 35.5* 32.9 32.9  MCHC 36.0 33.9 34.0  RDW 14.2 13.6 13.6  PLT 208 220 210    Cardiac Enzymes Recent Labs  Lab 01/26/18 1400  TROPONINI <0.03   No results for input(s): TROPIPOC in the last 168 hours.   BNPNo results for input(s): BNP, PROBNP in the last 168 hours.   DDimer No   results for input(s): DDIMER in the last 168 hours.   Radiology    Dg Chest Port 1 View  Result Date: 01/26/2018 CLINICAL DATA:  Chest pain EXAM: PORTABLE CHEST 1 VIEW COMPARISON:  11/28/2017 FINDINGS: Heart size and vascularity normal.  No effusion Mild left lower lobe atelectasis/infiltrate.  Right lung clear. IMPRESSION: Mild left lower lobe atelectasis/infiltrate. Electronically Signed   By: Charles  Clark M.D.   On: 01/26/2018 14:39    Cardiac Studies   Cath Today  Patient Profile     52 y.o. male with history of NSTEMI, s/p stent in 2013, and unsuccessful PCI Aug 2019 presented from [facility] with chest pain and ST-Elevations.  Assessment & Plan    1) Unstable Angina vs ACS: Presented with chest pain and SOB with ST elevation in precordial leads and reciprocal depression in inferior leads; but no troponin leak thus far. Findings consistent with unstable angina with LVH and early repol. History of unsuccessful PCI in Aug of this year. LHC today to evaluate for intervienable lesion, will consider CTVS consult pending these results. - Holding Plavix as surgery may be required - ASA, Atorvastatin 80mg  Daily, Coreg 12.5mg BID, - Heparin gtt and Nitro gtt - Trend Trops  2) HLD: Atorvastin as above 3) HTN: Controlled this AM. Continue with Amlodipine, Coreg, Hydralazine (also on nitro gtt) 4) Cocaine use: Still with intermitted use. Denies recent use. This continues to put patient at significant risk. 5) Hypokalemia: 3.3 this AM. Replete with 40mg PO.   For questions or updates, please contact [facility] Please consult [email or web address] for contact info under        Signed, Alexander Melvin, MD  01/27/2018, 10:25 AM    I have examined the patient and reviewed assessment and plan and discussed with patient.  Agree with above as stated.  Based on prior results, I don't think he needs CABG given lack of LAD disease.  Will have to base therapy on cath results. Avoid cocaine.    Kenneth Morales   

## 2018-01-27 NOTE — Progress Notes (Signed)
ANTICOAGULATION CONSULT NOTE - Follow-up Consult  Pharmacy Consult for heparin Indication: chest pain/ACS  Allergies  Allergen Reactions  . Penicillins Rash    Has patient had a PCN reaction causing immediate rash, facial/tongue/throat swelling, SOB or lightheadedness with hypotension: Yes Has patient had a PCN reaction causing severe rash involving mucus membranes or skin necrosis: No Has patient had a PCN reaction that required hospitalization: No Has patient had a PCN reaction occurring within the last 10 years: No If all of the above answers are "NO", then may proceed with Cephalosporin use.    Patient Measurements: Height: 5\' 10"  (177.8 cm) Weight: 150 lb 9.2 oz (68.3 kg) IBW/kg (Calculated) : 73 Heparin Dosing Weight: 68 kg  Vital Signs: Temp: 98 F (36.7 C) (10/04 0436) Temp Source: Oral (10/04 0436) BP: 132/93 (10/04 0751) Pulse Rate: 62 (10/04 0751)  Labs: Recent Labs    01/26/18 1400 01/26/18 1448 01/26/18 2150 01/27/18 0601  HGB 14.9  --  14.7 12.9*  HCT 41.5  --  43.4 37.9*  PLT 208  --  220 210  APTT  --  130*  --   --   LABPROT  --  13.2  --   --   INR  --  1.01  --   --   HEPARINUNFRC  --   --  0.21* 0.46  CREATININE 0.97  --  0.96 0.88  TROPONINI <0.03  --   --   --     Estimated Creatinine Clearance: 92.7 mL/min (by C-G formula based on SCr of 0.88 mg/dL).  Assessment: 52 yom cc chest pain with positive EKG with ST elevation. Pt on heparin for ACS. Plan for cath today -Heparin level at goal   Goal of Therapy:  Heparin level 0.3-0.7 units/ml Monitor platelets by anticoagulation protocol: Yes   Plan:  -No heparin changes needed -Will follow plans post cath  Harland German, PharmD Clinical Pharmacist Please check Amion for pharmacy contact number

## 2018-01-27 NOTE — Progress Notes (Signed)
EKG CRITICAL VALUE     12 lead EKG performed.  Critical value noted.  Marissa, RN notified.   Rachel Bo, CCT 01/27/2018 10:24 AM

## 2018-01-27 NOTE — Progress Notes (Signed)
Back from the cath lab by bed awake and alert. TR band to right wrist intact. Elevated with pillow. Pulse ox to tright thumb. Instructed to avoid moving right arm

## 2018-01-27 NOTE — Progress Notes (Signed)
ANTICOAGULATION CONSULT NOTE - Follow-up Consult  Pharmacy Consult for heparin Indication: chest pain/ACS  Allergies  Allergen Reactions  . Penicillins Rash    Has patient had a PCN reaction causing immediate rash, facial/tongue/throat swelling, SOB or lightheadedness with hypotension: Yes Has patient had a PCN reaction causing severe rash involving mucus membranes or skin necrosis: No Has patient had a PCN reaction that required hospitalization: No Has patient had a PCN reaction occurring within the last 10 years: No If all of the above answers are "NO", then may proceed with Cephalosporin use.    Patient Measurements: Height: 5\' 10"  (177.8 cm) Weight: 150 lb 9.2 oz (68.3 kg) IBW/kg (Calculated) : 73 Heparin Dosing Weight: 68 kg  Vital Signs: Temp: 98.3 F (36.8 C) (10/03 2137) Temp Source: Oral (10/03 2137) BP: 158/102 (10/03 2137) Pulse Rate: 72 (10/03 2137)  Labs: Recent Labs    01/26/18 1400 01/26/18 1448 01/26/18 2150  HGB 14.9  --   --   HCT 41.5  --   --   PLT 208  --   --   APTT  --  130*  --   LABPROT  --  13.2  --   INR  --  1.01  --   HEPARINUNFRC  --   --  0.21*  CREATININE 0.97  --   --   TROPONINI <0.03  --   --     Estimated Creatinine Clearance: 84.1 mL/min (by C-G formula based on SCr of 0.97 mg/dL).  Assessment: 52 yom cc chest pain with positive EKG with ST elevation. Pt on heparin for ACS. Plan to cath in the morning. Heparin level subtherapeutic (0.21) on gtt at 1050 units/hr. No issues with line or bleeding reported per RN.  Goal of Therapy:  Heparin level 0.3-0.7 units/ml Monitor platelets by anticoagulation protocol: Yes   Plan:  Increase heparin to 1300 units/hr Will f/u 6 hr heparin level  Christoper Fabian, PharmD, BCPS Clinical pharmacist  **Pharmacist phone directory can now be found on [email or web address] ([email or web address]).  Listed under [facility]. 01/27/2018,12:02 AM

## 2018-01-27 NOTE — H&P (View-Only) (Signed)
Progress Note  Patient Name: Kenneth Morales Date of Encounter: 01/27/2018  Primary Cardiologist: Laurier Nancy, MD  Subjective   Feelig okay this morning. Denies chest pain, SOB, or headache.  Inpatient Medications    Scheduled Meds: . amLODipine  5 mg Oral Daily  . [START ON 01/28/2018] aspirin EC  81 mg Oral Daily  . atorvastatin  80 mg Oral q1800  . carvedilol  12.5 mg Oral BID WC  . feeding supplement (ENSURE ENLIVE)  237 mL Oral BID BM  . hydrALAZINE  25 mg Oral BID  . sodium chloride flush  3 mL Intravenous Q12H   Continuous Infusions: . sodium chloride    . sodium chloride 1 mL/kg/hr (01/27/18 0517)  . heparin 1,300 Units/hr (01/27/18 0008)  . nitroGLYCERIN 5 mcg/min (01/27/18 0000)   PRN Meds: sodium chloride, acetaminophen, nitroGLYCERIN, ondansetron (ZOFRAN) IV, sodium chloride flush   Vital Signs    Vitals:   01/27/18 0003 01/27/18 0436 01/27/18 0610 01/27/18 0751  BP: 118/75 127/81  (!) 132/93  Pulse: 81 70  62  Resp: 19 11  15   Temp: 98.1 F (36.7 C) 98 F (36.7 C)    TempSrc: Oral Oral    SpO2: 94% 94%  96%  Weight:   68.3 kg   Height:        Intake/Output Summary (Last 24 hours) at 01/27/2018 1025 Last data filed at 01/27/2018 0900 Gross per 24 hour  Intake 526.49 ml  Output 275 ml  Net 251.49 ml   Filed Weights   01/26/18 2137 01/27/18 0610  Weight: 68.3 kg 68.3 kg    Telemetry    NSR - Personally Reviewed  ECG    NSR, ST-elevation in precordial leads, ST depression inferior leads, ST-changes consistent with LVH / early repolarization - Personally Reviewed  Physical Exam   GEN: No acute distress.   Neck: No JVD Cardiac: RRR, no murmurs, rubs, or gallops.  Respiratory: Clear to auscultation bilaterally. GI: Soft, nontender, non-distended  MS: No edema; No deformity. Neuro:  Nonfocal  Psych: Normal affect   Labs    Chemistry Recent Labs  Lab 01/26/18 1400 01/26/18 2150 01/27/18 0601  NA 141  --  138  K 4.2  --   3.3*  CL 109  --  108  CO2 25  --  23  GLUCOSE 98  --  90  BUN 13  --  11  CREATININE 0.97 0.96 0.88  CALCIUM 9.2  --  8.6*  PROT 7.1  --   --   ALBUMIN 4.1  --   --   AST 19  --   --   ALT 13  --   --   ALKPHOS 97  --   --   BILITOT 0.8  --   --   GFRNONAA >60 >60 >60  GFRAA >60 >60 >60  ANIONGAP 7  --  7     Hematology Recent Labs  Lab 01/26/18 1400 01/26/18 2150 01/27/18 0601  WBC 6.7 7.2 7.2  RBC 4.21* 4.47 3.92*  HGB 14.9 14.7 12.9*  HCT 41.5 43.4 37.9*  MCV 98.6 97.1 96.7  MCH 35.5* 32.9 32.9  MCHC 36.0 33.9 34.0  RDW 14.2 13.6 13.6  PLT 208 220 210    Cardiac Enzymes Recent Labs  Lab 01/26/18 1400  TROPONINI <0.03   No results for input(s): TROPIPOC in the last 168 hours.   BNPNo results for input(s): BNP, PROBNP in the last 168 hours.   DDimer No  results for input(s): DDIMER in the last 168 hours.   Radiology    Dg Chest Port 1 View  Result Date: 01/26/2018 CLINICAL DATA:  Chest pain EXAM: PORTABLE CHEST 1 VIEW COMPARISON:  11/28/2017 FINDINGS: Heart size and vascularity normal.  No effusion Mild left lower lobe atelectasis/infiltrate.  Right lung clear. IMPRESSION: Mild left lower lobe atelectasis/infiltrate. Electronically Signed   By: Marlan Palau M.D.   On: 01/26/2018 14:39    Cardiac Studies   Cath Today  Patient Profile     52 y.o. male with history of NSTEMI, s/p stent in 2013, and unsuccessful PCI Aug 2019 presented from [facility] with chest pain and ST-Elevations.  Assessment & Plan    1) Unstable Angina vs ACS: Presented with chest pain and SOB with ST elevation in precordial leads and reciprocal depression in inferior leads; but no troponin leak thus far. Findings consistent with unstable angina with LVH and early repol. History of unsuccessful PCI in Aug of this year. LHC today to evaluate for intervienable lesion, will consider CTVS consult pending these results. - Holding Plavix as surgery may be required - ASA, Atorvastatin 80mg   Daily, Coreg 12.5mg  BID, - Heparin gtt and Nitro gtt - Trend Trops  2) HLD: Atorvastin as above 3) HTN: Controlled this AM. Continue with Amlodipine, Coreg, Hydralazine (also on nitro gtt) 4) Cocaine use: Still with intermitted use. Denies recent use. This continues to put patient at significant risk. 5) Hypokalemia: 3.3 this AM. Replete with 40mg  PO.   For questions or updates, please contact [facility] Please consult [email or web address] for contact info under        Signed, Beola Cord, MD  01/27/2018, 10:25 AM    I have examined the patient and reviewed assessment and plan and discussed with patient.  Agree with above as stated.  Based on prior results, I don't think he needs CABG given lack of LAD disease.  Will have to base therapy on cath results. Avoid cocaine.    Lance Muss

## 2018-01-27 NOTE — Interval H&P Note (Signed)
Cath Lab Visit (complete for each Cath Lab visit)  Clinical Evaluation Leading to the Procedure:   ACS: Yes.    Non-ACS:    Anginal Classification: CCS IV  Anti-ischemic medical therapy: Minimal Therapy (1 class of medications)  Non-Invasive Test Results: No non-invasive testing performed  Prior CABG: No previous CABG      History and Physical Interval Note:  01/27/2018 3:55 PM  Kenneth Morales  has presented today for surgery, with the diagnosis of unstable angina  The various methods of treatment have been discussed with the patient and family. After consideration of risks, benefits and other options for treatment, the patient has consented to  Procedure(s): LEFT HEART CATH AND CORONARY ANGIOGRAPHY (N/A) as a surgical intervention .  The patient's history has been reviewed, patient examined, no change in status, stable for surgery.  I have reviewed the patient's chart and labs.  Questions were answered to the patient's satisfaction.     Lance Muss

## 2018-01-27 NOTE — Progress Notes (Signed)
Transported to the cath lab by bed, awake and alert.

## 2018-01-27 NOTE — Progress Notes (Addendum)
Initial Nutrition Assessment  DOCUMENTATION CODES:   Not applicable  INTERVENTION:   -Once diet is advanced, will add:   -Ensure Enlive po daily, each supplement provides 350 kcal and 20 grams of protein -MVI with minerals daly  NUTRITION DIAGNOSIS:   Increased nutrient needs related to acute illness, social / environmental circumstances(MI) as evidenced by estimated needs.  GOAL:   Patient will meet greater than or equal to 90% of their needs  MONITOR:   PO intake, Supplement acceptance, Diet advancement, Labs, Weight trends, Skin, I & O's  REASON FOR ASSESSMENT:   Malnutrition Screening Tool    ASSESSMENT:   Kenneth Morales is a 52 year old male with known coronary artery disease with prior non-ST elevation myocardial infarction in May 2013 in the setting of cocaine use which demonstrated a cath with 99% mid left circumflex stenosis with large thrombus burden.  PCI, bare-metal stent 4.0 x 15 mm vision, placement to mid circumflex with residual LAD of 20%, RCA 30% and ejection fraction of 60% who was transferred from [facility] emergency department for evaluation of chest pain.  Pt admitted with acute coronary syndrome and unstable angina.   Spoke with pt at bedside, who reports great appetite, which has improved since hospitalization. He reports he is really hungry now, due to NPO status. He shares that he typically consumes 3 meals per day and there have been no changes with his diet or physical activity. He shares that he was hospitalized for an MI approximately one month ago and suspects that stress contributed to his recent weight loss.   Pt shares he lost "a few" pounds. He is unsure of UBW and denies any changes in his appearance or the way his clothing fits. Reviewed wt hx, which reveals a 3% wt loss over the past 2 months, which is not significant for time frame.  Case discussed with RN, who confirms that pt is NPO for cath today.   Pt exam was prematurely ended due to pt  taking a phone call during interview.   Labs reviewed: K: 3.3  NUTRITION - FOCUSED PHYSICAL EXAM:    Most Recent Value  Orbital Region  No depletion  Upper Arm Region  No depletion  Thoracic and Lumbar Region  No depletion  Buccal Region  No depletion  Temple Region  No depletion  Clavicle Bone Region  No depletion  Clavicle and Acromion Bone Region  No depletion  Scapular Bone Region  Mild depletion  Dorsal Hand  No depletion  Patellar Region  No depletion  Anterior Thigh Region  No depletion  Posterior Calf Region  No depletion  Edema (RD Assessment)  None  Hair  Reviewed  Eyes  Reviewed  Mouth  Reviewed  Skin  Reviewed  Nails  Reviewed       Diet Order:   Diet Order            Diet NPO time specified  Diet effective 1000              EDUCATION NEEDS:   No education needs have been identified at this time  Skin:  Skin Assessment: Reviewed RN Assessment  Last BM:  01/25/18  Height:   Ht Readings from Last 1 Encounters:  01/26/18 5\' 10"  (1.778 m)    Weight:   Wt Readings from Last 1 Encounters:  01/27/18 68.3 kg    Ideal Body Weight:  75.5 kg  BMI:  Body mass index is 21.61 kg/m.  Estimated Nutritional Needs:   Kcal:  1700-1900  Protein:  85-100 grams  Fluid:  1.7-1.9 L    Rylon Poitra A. Mayford Knife, RD, LDN, CDE Pager: [phone] After hours Pager: [phone]

## 2018-01-27 NOTE — Progress Notes (Signed)
Transported to the cath lab by bed but was asked to go back to 2c and to come to do procedure in 2 hours. Pt and family made aware. MD claimed to restart heparin.

## 2018-01-28 LAB — CBC
HCT: 39.9 % (ref 39.0–52.0)
Hemoglobin: 13.4 g/dL (ref 13.0–17.0)
MCH: 32.6 pg (ref 26.0–34.0)
MCHC: 33.6 g/dL (ref 30.0–36.0)
MCV: 97.1 fL (ref 78.0–100.0)
Platelets: 224 10*3/uL (ref 150–400)
RBC: 4.11 MIL/uL — ABNORMAL LOW (ref 4.22–5.81)
RDW: 13.6 % (ref 11.5–15.5)
WBC: 7.1 10*3/uL (ref 4.0–10.5)

## 2018-01-28 LAB — BASIC METABOLIC PANEL
Anion gap: 9 (ref 5–15)
BUN: 7 mg/dL (ref 6–20)
CO2: 21 mmol/L — ABNORMAL LOW (ref 22–32)
Calcium: 8.7 mg/dL — ABNORMAL LOW (ref 8.9–10.3)
Chloride: 108 mmol/L (ref 98–111)
Creatinine, Ser: 0.98 mg/dL (ref 0.61–1.24)
GFR calc Af Amer: >60 mL/min (ref >60)
GFR calc non Af Amer: >60 mL/min (ref >60)
Glucose, Bld: 95 mg/dL (ref 70–99)
Potassium: 3.6 mmol/L (ref 3.5–5.1)
Sodium: 138 mmol/L (ref 135–145)

## 2018-01-28 MED ORDER — TICAGRELOR 90 MG PO TABS: 90.0000 mg | ORAL_TABLET | Freq: Two times a day (BID) | ORAL | 5 refills | Status: DC

## 2018-01-28 MED ORDER — TICAGRELOR 90 MG PO TABS
90.0000 mg | ORAL_TABLET | Freq: Two times a day (BID) | ORAL | Status: DC
  Administered 2018-01-28: 90 mg via ORAL

## 2018-01-28 MED ORDER — CARVEDILOL 12.5 MG PO TABS: 12.5000 mg | ORAL_TABLET | Freq: Two times a day (BID) | ORAL | 5 refills | Status: DC

## 2018-01-28 MED ORDER — ATORVASTATIN CALCIUM 80 MG PO TABS: 80.0000 mg | ORAL_TABLET | Freq: Every day | ORAL | 5 refills | Status: DC

## 2018-01-28 MED ORDER — HYDRALAZINE HCL 25 MG PO TABS: 25.0000 mg | ORAL_TABLET | Freq: Two times a day (BID) | ORAL | 0 refills | Status: DC

## 2018-01-28 MED ORDER — AMLODIPINE BESYLATE 5 MG PO TABS: 5.0000 mg | ORAL_TABLET | Freq: Every day | ORAL | 5 refills | Status: DC

## 2018-01-28 MED ORDER — TICAGRELOR 90 MG PO TABS: 90.0000 mg | ORAL_TABLET | Freq: Two times a day (BID) | ORAL | 0 refills | Status: DC

## 2018-01-28 MED ORDER — CARVEDILOL 12.5 MG PO TABS: 12.5000 mg | ORAL_TABLET | Freq: Two times a day (BID) | ORAL | 0 refills | Status: DC

## 2018-01-28 MED ORDER — AMLODIPINE BESYLATE 5 MG PO TABS: 5.0000 mg | ORAL_TABLET | Freq: Every day | ORAL | 0 refills | Status: DC

## 2018-01-28 MED ORDER — HYDRALAZINE HCL 25 MG PO TABS: 25.0000 mg | ORAL_TABLET | Freq: Two times a day (BID) | ORAL | 5 refills | Status: DC

## 2018-01-28 NOTE — Progress Notes (Signed)
Progress Note  Patient Name: Kenneth Morales Date of Encounter: 01/28/2018  Primary Cardiologist: Laurier Nancy, MD   Subjective   Kenneth Morales is postop day 1 AV groove circumflex PCI and stenting and obtuse marginal branch PTCA in the setting of unstable angina.  He has residual disease in his dominant RCA, diagonal branch with normal LV function.  He is pain-free today.  Enzymes are negative.  His right radial puncture site is stable.  He is off all drips.  Inpatient Medications    Scheduled Meds: . amLODipine  5 mg Oral Daily  . aspirin  81 mg Oral Daily  . atorvastatin  80 mg Oral q1800  . carvedilol  12.5 mg Oral BID WC  . feeding supplement (ENSURE ENLIVE)  237 mL Oral QHS  . hydrALAZINE  25 mg Oral BID  . multivitamin with minerals  1 tablet Oral Daily  . sodium chloride flush  3 mL Intravenous Q12H  . ticagrelor  90 mg Oral Q12H   Continuous Infusions: . sodium chloride Stopped (01/28/18 0659)  . nitroGLYCERIN Stopped (01/28/18 1610)   PRN Meds: sodium chloride, acetaminophen, nitroGLYCERIN, ondansetron (ZOFRAN) IV, sodium chloride flush, traZODone   Vital Signs    Vitals:   01/28/18 0641 01/28/18 0648 01/28/18 0649 01/28/18 0800  BP:  129/85 129/85 97/64  Pulse:  62 (!) 59 70  Resp:   (!) 9 17  Temp:    97.9 F (36.6 C)  TempSrc:    Oral  SpO2:   98% 97%  Weight: 68.9 kg     Height:        Intake/Output Summary (Last 24 hours) at 01/28/2018 0842 Last data filed at 01/28/2018 0700 Gross per 24 hour  Intake 883.39 ml  Output 1125 ml  Net -241.61 ml   Filed Weights   01/26/18 2137 01/27/18 0610 01/28/18 0641  Weight: 68.3 kg 68.3 kg 68.9 kg    Telemetry    Sinus rhythm- Personally Reviewed  ECG    Normal sinus rhythm at 66 with left ventricular hypertrophy and repolarization changes.- Personally Reviewed  Physical Exam   GEN: No acute distress.   Neck: No JVD Cardiac: RRR, no murmurs, rubs, or gallops.  Respiratory: Clear to auscultation  bilaterally. GI: Soft, nontender, non-distended  MS: No edema; No deformity. Neuro:  Nonfocal  Psych: Normal affect  Extremities: Right radial puncture site stable  Labs    Chemistry Recent Labs  Lab 01/26/18 1400 01/26/18 2150 01/27/18 0601 01/28/18 0225  NA 141  --  138 138  K 4.2  --  3.3* 3.6  CL 109  --  108 108  CO2 25  --  23 21*  GLUCOSE 98  --  90 95  BUN 13  --  11 7  CREATININE 0.97 0.96 0.88 0.98  CALCIUM 9.2  --  8.6* 8.7*  PROT 7.1  --   --   --   ALBUMIN 4.1  --   --   --   AST 19  --   --   --   ALT 13  --   --   --   ALKPHOS 97  --   --   --   BILITOT 0.8  --   --   --   GFRNONAA >60 >60 >60 >60  GFRAA >60 >60 >60 >60  ANIONGAP 7  --  7 9     Hematology Recent Labs  Lab 01/26/18 2150 01/27/18 0601 01/28/18 0225  WBC 7.2  7.2 7.1  RBC 4.47 3.92* 4.11*  HGB 14.7 12.9* 13.4  HCT 43.4 37.9* 39.9  MCV 97.1 96.7 97.1  MCH 32.9 32.9 32.6  MCHC 33.9 34.0 33.6  RDW 13.6 13.6 13.6  PLT 220 210 224    Cardiac Enzymes Recent Labs  Lab 01/26/18 1400 01/27/18 1258  TROPONINI <0.03 <0.03   No results for input(s): TROPIPOC in the last 168 hours.   BNPNo results for input(s): BNP, PROBNP in the last 168 hours.   DDimer No results for input(s): DDIMER in the last 168 hours.   Radiology    Dg Chest Port 1 View  Result Date: 01/26/2018 CLINICAL DATA:  Chest pain EXAM: PORTABLE CHEST 1 VIEW COMPARISON:  11/28/2017 FINDINGS: Heart size and vascularity normal.  No effusion Mild left lower lobe atelectasis/infiltrate.  Right lung clear. IMPRESSION: Mild left lower lobe atelectasis/infiltrate. Electronically Signed   By: Marlan Palau M.D.   On: 01/26/2018 14:39    Cardiac Studies   Cardiac catheterization/PCI and stent (01/27/2018)  Conclusion     Ost 1st Diag to 1st Diag lesion is 80% stenosed.  Prox RCA to Mid RCA lesion is 99% stenosed. This appears dissected.  Prox Cx to Mid Cx lesion is 10% stenosed. Old BMS is patent  Mid Cx lesion  is 75% stenosed.  A drug-eluting stent was successfully placed using a STENT SYNERGY DES 3X16.  Post intervention, there is a 0% residual stenosis.  3rd Mrg lesion is 100% stenosed.  Balloon angioplasty was performed using a BALLOON SAPPHIRE 2.0X15.  Post intervention, there is a 25% residual stenosis. Unable to deliver stent through toruosity.  The left ventricular systolic function is normal.  LV end diastolic pressure is normal.  The left ventricular ejection fraction is 55-65% by visual estimate.  There is no aortic valve stenosis.    Recommend uninterrupted dual antiplatelet therapy with Aspirin 81mg  daily and Ticagrelor 90mg  twice daily for a minimum of 6 months (stable ischemic heart disease - Class I recommendation).   Can switch to Plavix if there is an affordability issue with Brilinta.   He needs to have importance of medication compliance stressed.    If he shows compliance with meds and continues to have angina, we can attempt CTO PCI of the RCA, at a later time.     Patient Profile     52 y.o. male with history of NSTEMI, s/p stent in 2013, and unsuccessful PCI Aug 2019 presented from [facility] with chest pain and ST-Elevations.  His other problems include hypertension, hyperlipidemia and cocaine abuse which he uses intermittently and recently.  He underwent stenting of his AV groove circumflex, and PTCA of his third obtuse marginal branch.  He has residual disease in his mid dominant RCA and diagonal branch with normal LV function.  He is pain-free this morning and stable for discharge.  Assessment & Plan    1: Coronary artery disease- history of prior circumflex stenting, unsuccessful RCA intervention with disease in his AV groove circumflex, obtuse marginal branch and diagonal branch.  He is postop day 1 stenting in the AV groove beyond prior stent,  POBA of third obtuse marginal branch.  The plan apparently was for continued medical therapy of the subtotally  occluded RCA which was recently instrumented and diagonal branch.  He has remained pain-free.  He can be discharged home today on dual antiplatelet therapy including aspirin and Brilinta.  He has a follow-up already scheduled at [facility] clinic later this month.  2: Hyperlipidemia-on  atorvastatin  3: Hypertension- controlled on current medications  4: Cocaine use-counseled about discontinuing use in setting of CAD.   For questions or updates, please contact [facility] Please consult [email or web address] for contact info under        Signed, Nanetta Batty, MD  01/28/2018, 8:42 AM

## 2018-01-28 NOTE — Discharge Summary (Addendum)
Discharge Summary    Patient ID: Kenneth Morales,  MRN: [id number], DOB/AGE: 54-Jun-1967 54 y.o.  Admit date: 01/26/2018 Discharge date: 01/28/2018  Primary Care Provider: [facility] Primary Cardiologist: Laurier Nancy, MD --> plans to follow with Dr. Lady Gary with [facility]  Discharge Diagnoses    Principal Problem:   Unstable angina [facility]) Active Problems:   Hyperlipidemia   Hypertension   Tobacco abuse   History of Present Illness     Kenneth Morales is a 52 y.o. male with past medical history of CAD (s/p NSTEMI in 2013 in the setting of cocaine use with BMS to LCx, repeat NSTEMI in 11/2017 with unsuccessful attempt at PCI to RCA and 3rd Mrg), HTN, HLD, tobacco use, and medication noncompliance who presented to [facility] on 01/26/2018 for evaluation of recurrent chest pain and was transferred to [facility] for further evaluation.   He reported having new onset chest discomfort starting 30 minutes prior to his arrival to the ED with associated nausea, diaphoresis, and shortness of breath which was worse than his prior symptoms in 11/2017. Initial troponin was negative but EKG showed diffuse ST depression along the inferior leads. Given his cardiac catheterization in 11/2017 and presenting symptoms, he was started on IV Heparin with anticipation of a repeat cardiac catheterization the following day.  Hospital Course     Consultants: None  The following morning, he denied any recurrent chest discomfort and cyclic troponin values remained negative. UDS was positive for opiates and cocaine. Catheterization was performed by Dr. Eldridge Dace later in the day and showed 80% Ost 1st Diag, 99% RCA stenosis, 10% Proximal LCx with patent stent but 75% Mid-LCx stenosis, and 100% 3rd Mrg stenosis. He underwent DES placement to the mid-LCx and balloon angioplasty to the 3rd Mrg with the lesion being reduced to 25%. He was started on DAPT with ASA and Brilinta and it was mentioned that if  he continued to have angina and was compliant with his medications, CTO PCI of the RCA could be considered in the future.  On 01/28/2018, he denied any recurrent chest discomfort and his right radial cath site appeared stable. He was last examined by Dr. Allyson Sabal and deemed stable for discharge. Case management was consulted to assist with medication needs and he was provided with a 30-day card for Brilinta.  Compliance with this and ASA were strongly encouraged. He was started on beta-blocker therapy during admission but if he has recurrent cocaine use, would need to consider discontinuing this.  Was also discharged on Amlodipine 5 mg daily and Hydralazine 25 mg twice daily for BP control along with Atorvastatin 80 mg daily for HLD. Has been referred to Cardiac Rehab at [facility]. He wishes to follow-up with Dr. Lady Gary at [facility] and has been informed to do so within two weeks of discharge.  __________  Discharge Vitals Blood pressure 126/85, pulse 65, temperature 98.1 F (36.7 C), temperature source Oral, resp. rate 16, height 5\' 10"  (1.778 m), weight 68.9 kg, SpO2 97 %.  Filed Weights   01/26/18 2137 01/27/18 0610 01/28/18 0641  Weight: 68.3 kg 68.3 kg 68.9 kg    Labs & Radiologic Studies     CBC Recent Labs    01/26/18 1400  01/27/18 0601 01/28/18 0225  WBC 6.7   < > 7.2 7.1  NEUTROABS 4.0  --   --   --   HGB 14.9   < > 12.9* 13.4  HCT 41.5   < >  37.9* 39.9  MCV 98.6   < > 96.7 97.1  PLT 208   < > 210 224   < > = values in this interval not displayed.   Basic Metabolic Panel Recent Labs    16/10/96 0601 01/28/18 0225  NA 138 138  K 3.3* 3.6  CL 108 108  CO2 23 21*  GLUCOSE 90 95  BUN 11 7  CREATININE 0.88 0.98  CALCIUM 8.6* 8.7*   Liver Function Tests Recent Labs    01/26/18 1400  AST 19  ALT 13  ALKPHOS 97  BILITOT 0.8  PROT 7.1  ALBUMIN 4.1   No results for input(s): LIPASE, AMYLASE in the last 72 hours. Cardiac Enzymes Recent Labs    01/26/18 1400  01/27/18 1258  TROPONINI <0.03 <0.03   BNP Invalid input(s): POCBNP D-Dimer No results for input(s): DDIMER in the last 72 hours. Hemoglobin A1C No results for input(s): HGBA1C in the last 72 hours. Fasting Lipid Panel Recent Labs    01/27/18 0601  CHOL 159  HDL 38*  LDLCALC 102*  TRIG 97  CHOLHDL 4.2   Thyroid Function Tests No results for input(s): TSH, T4TOTAL, T3FREE, THYROIDAB in the last 72 hours.  Invalid input(s): FREET3  Dg Chest Port 1 View  Result Date: 01/26/2018 CLINICAL DATA:  Chest pain EXAM: PORTABLE CHEST 1 VIEW COMPARISON:  11/28/2017 FINDINGS: Heart size and vascularity normal.  No effusion Mild left lower lobe atelectasis/infiltrate.  Right lung clear. IMPRESSION: Mild left lower lobe atelectasis/infiltrate. Electronically Signed   By: Marlan Palau M.D.   On: 01/26/2018 14:39     Diagnostic Studies/Procedures     Cardiac Catheterization: 01/27/2018  Ost 1st Diag to 1st Diag lesion is 80% stenosed.  Prox RCA to Mid RCA lesion is 99% stenosed. This appears dissected.  Prox Cx to Mid Cx lesion is 10% stenosed. Old BMS is patent  Mid Cx lesion is 75% stenosed.  A drug-eluting stent was successfully placed using a STENT SYNERGY DES 3X16.  Post intervention, there is a 0% residual stenosis.  3rd Mrg lesion is 100% stenosed.  Balloon angioplasty was performed using a BALLOON SAPPHIRE 2.0X15.  Post intervention, there is a 25% residual stenosis. Unable to deliver stent through toruosity.  The left ventricular systolic function is normal.  LV end diastolic pressure is normal.  The left ventricular ejection fraction is 55-65% by visual estimate.  There is no aortic valve stenosis.    Recommend uninterrupted dual antiplatelet therapy with Aspirin 81mg  daily and Ticagrelor 90mg  twice daily for a minimum of 6 months (stable ischemic heart disease - Class I recommendation).   Can switch to Plavix if there is an affordability issue with  Brilinta.   He needs to have importance of medication compliance stressed.    If he shows compliance with meds and continues to have angina, we can attempt CTO PCI of the RCA, at a later time.  Disposition   Pt is being discharged home today in good condition.  Follow-up Plans & Appointments    Follow-up Information    Fath, Darlin Priestly, MD. Schedule an appointment as soon as possible for a visit in 1 week(s).   Specialty:  Cardiology Contact information: [address] [place] [address] [phone]          Discharge Instructions    Amb Referral to Cardiac Rehabilitation   Complete by:  As directed    Diagnosis:  Coronary Stents   Diet - low sodium heart healthy  Complete by:  As directed    Discharge instructions   Complete by:  As directed    PLEASE REMEMBER TO BRING ALL OF YOUR MEDICATIONS TO EACH OF YOUR FOLLOW-UP OFFICE VISITS.  PLEASE ATTEND ALL SCHEDULED FOLLOW-UP APPOINTMENTS.   Activity: Increase activity slowly as tolerated. You may shower, but no soaking baths (or swimming) for 1 week. No driving for 24 hours. No lifting over 5 lbs for 1 week. No sexual activity for 1 week.   You May Return to Work: in 1 week (if applicable)  Wound Care: You may wash cath site gently with soap and water. Keep cath site clean and dry. If you notice pain, swelling, bleeding or pus at your cath site, please call [phone].   Increase activity slowly   Complete by:  As directed       Discharge Medications     Medication List    STOP taking these medications   clopidogrel 75 MG tablet Commonly known as:  PLAVIX   isosorbide mononitrate 30 MG 24 hr tablet Commonly known as:  IMDUR   lisinopril 10 MG tablet Commonly known as:  PRINIVIL,ZESTRIL   metoprolol tartrate 25 MG tablet Commonly known as:  LOPRESSOR     TAKE these medications   amLODipine 5 MG tablet Commonly known as:  NORVASC Take 1 tablet (5 mg total) by mouth daily. Start taking  on:  01/29/2018   aspirin 81 MG tablet Take 1 tablet (81 mg total) by mouth daily.   atorvastatin 80 MG tablet Commonly known as:  LIPITOR Take 1 tablet (80 mg total) by mouth daily at 6 PM.   carvedilol 12.5 MG tablet Commonly known as:  COREG Take 1 tablet (12.5 mg total) by mouth 2 (two) times daily with a meal.   hydrALAZINE 25 MG tablet Commonly known as:  APRESOLINE Take 1 tablet (25 mg total) by mouth 2 (two) times daily.   nitroGLYCERIN 0.4 MG SL tablet Commonly known as:  NITROSTAT Place 0.4 mg under the tongue as directed. Place 1 tablet (0.4mg  total) under the tongue every 5 minutes as needed for chest pain. May take 3 doses.   ticagrelor 90 MG Tabs tablet Commonly known as:  BRILINTA Take 1 tablet (90 mg total) by mouth every 12 (twelve) hours.         Allergies Allergies  Allergen Reactions  . Penicillins Rash    Has patient had a PCN reaction causing immediate rash, facial/tongue/throat swelling, SOB or lightheadedness with hypotension: Yes Has patient had a PCN reaction causing severe rash involving mucus membranes or skin necrosis: No Has patient had a PCN reaction that required hospitalization: No Has patient had a PCN reaction occurring within the last 10 years: No If all of the above answers are "NO", then may proceed with Cephalosporin use.     Acute coronary syndrome (MI, NSTEMI, STEMI, etc) this admission?: Yes.     AHA/ACC Clinical Performance & Quality Measures: 1. Aspirin prescribed? - Yes 2. ADP Receptor Inhibitor (Plavix/Clopidogrel, Brilinta/Ticagrelor or Effient/Prasugrel) prescribed (includes medically managed patients)? - Yes 3. Beta Blocker prescribed? - Yes 4. High Intensity Statin (Lipitor 40-80mg  or Crestor 20-40mg ) prescribed? - Yes 5. EF assessed during THIS hospitalization? - Yes 6. For EF <40%, was ACEI/ARB prescribed? - Not Applicable (EF >/= 40%) 7. For EF <40%, Aldosterone Antagonist (Spironolactone or Eplerenone) prescribed?  - Not Applicable (EF >/= 40%) 8. Cardiac Rehab Phase II ordered (Included Medically managed Patients)? - Yes    Outstanding Labs/Studies  None  Duration of Discharge Encounter   Greater than 30 minutes including physician time.  Signed, Ellsworth Lennox, PA-C 01/28/2018, 1:11 PM \  Agree with note by Reita May  Status post mid AV groove circumflex PCI and drug-eluting stenting and angioplasty of third marginal branch was with residual high-grade mid dominant RCA stenosis and diagonal branch disease.  This was performed radially by Dr. Eldridge Dace.  He stable for discharge and is scheduled to follow-up with Dr. Lady Gary or [facility] in several weeks.  Runell Gess, M.D., FACP, [facility], [place] [facility] [address] [place], [place]  [address]  [phone] 01/28/2018 4:15 PM

## 2018-01-28 NOTE — Progress Notes (Addendum)
CARDIAC REHAB PHASE I   PRE:  Rate/Rhythm: 72  BP:  Sitting: 119/89     SaO2: 97ra  MODE:  Ambulation: 300 ft   POST:  Rate/Rhythm: 70  BP:  Sitting: 120/94     SaO2: 97ra  11:50a-12:25p Patient ambulated independently with shortness of breath. No other complaints. Education completed. Agreed to Cardiac Rehab at [facility]. Order placed. Per patient will f/u with Dr. Lady Gary at [facility].  Barnabas Lister Granvel [place], MS 01/28/2018 12:24 PM

## 2018-01-28 NOTE — Care Management Note (Signed)
Case Management Note  Patient Details  Name: Kenneth Morales MRN: [id number] Date of Birth: [birth year]  Subjective/Objective:    From home with his daughter, s/p stent intervention, will be on brilinta, NCM gave patient the 30 day savings coupon, he will go to a [facility] in [place] to get his brilinta.  He states he has transportation home, he also states he does not have any money to get the rest of his medications, he has Medicaid with a copay of 3.00 each.  The pharmacy will assist him with  4 coreg tablets, 4 hydralizine tabs and 1 norvasc tab.  MD will need to send script to Main pharmacy and Cherly Hensen the RN will pick up the medications before patient is discharged.                 Action/Plan: DC home when ready.   Expected Discharge Date:                  Expected Discharge Plan:  Home/Self Care(Independent from home)  In-House Referral:     Discharge planning Services  CM Consult, Medication Assistance  Post Acute Care Choice:    Choice offered to:     DME Arranged:    DME Agency:     HH Arranged:    HH Agency:     Status of Service:  Completed, signed off  If discussed at [employer] of Stay Meetings, dates discussed:    Additional Comments:  Leone Haven, RN 01/28/2018, 9:40 AM

## 2018-01-28 NOTE — Progress Notes (Signed)
EKG CRITICAL VALUE     12 lead EKG performed.  Critical value noted.  Nigel Sloop, RN notified.   Milo Solana H, CCT 01/28/2018 7:42 AM

## 2018-01-30 ENCOUNTER — Encounter (HOSPITAL_COMMUNITY): Payer: Self-pay | Admitting: Interventional Cardiology

## 2018-01-30 ENCOUNTER — Telehealth: Payer: Self-pay | Admitting: Student

## 2018-01-30 NOTE — Telephone Encounter (Signed)
Returned call. Left message on machine with BS information requested.

## 2018-01-30 NOTE — Telephone Encounter (Signed)
Spoke with pharmacy

## 2018-01-30 NOTE — Telephone Encounter (Signed)
Please call Toniann Fail @ [facility] @ [phone], she's needing B. [employer] DEA # for this pt's Rx's

## 2018-01-31 MED FILL — Nitroglycerin IV Soln 100 MCG/ML in D5W: INTRA_ARTERIAL | Qty: 10 | Status: AC

## 2018-01-31 MED FILL — Heparin Sod (Porcine)-NaCl IV Soln 1000 Unit/500ML-0.9%: INTRAVENOUS | Qty: 500 | Status: AC

## 2018-03-28 ENCOUNTER — Other Ambulatory Visit: Payer: Self-pay

## 2018-03-28 ENCOUNTER — Emergency Department
Admission: EM | Admit: 2018-03-28 | Discharge: 2018-03-28 | Disposition: A | Discharge status: Home / Self Care | Payer: Medicaid Other | Attending: Emergency Medicine | Admitting: Emergency Medicine

## 2018-03-28 ENCOUNTER — Encounter: Payer: Self-pay | Admitting: Emergency Medicine

## 2018-03-28 DIAGNOSIS — Z7982 Long term (current) use of aspirin: Secondary | ICD-10-CM | POA: Insufficient documentation

## 2018-03-28 DIAGNOSIS — I2510 Atherosclerotic heart disease of native coronary artery without angina pectoris: Secondary | ICD-10-CM | POA: Insufficient documentation

## 2018-03-28 DIAGNOSIS — F17210 Nicotine dependence, cigarettes, uncomplicated: Secondary | ICD-10-CM | POA: Insufficient documentation

## 2018-03-28 DIAGNOSIS — M545 Low back pain, unspecified: Secondary | ICD-10-CM

## 2018-03-28 DIAGNOSIS — Z79899 Other long term (current) drug therapy: Secondary | ICD-10-CM | POA: Diagnosis not present

## 2018-03-28 DIAGNOSIS — I10 Essential (primary) hypertension: Secondary | ICD-10-CM | POA: Insufficient documentation

## 2018-03-28 MED ORDER — METHYLPREDNISOLONE SODIUM SUCC 125 MG IJ SOLR
125.0000 mg | Freq: Once | INTRAMUSCULAR | Status: AC
  Administered 2018-03-28: 125 mg via INTRAMUSCULAR
  Filled 2018-03-28: qty 2

## 2018-03-28 NOTE — ED Triage Notes (Signed)
Pt states he got out of a car wrong two days prior and since has had pain with movement.

## 2018-03-28 NOTE — ED Notes (Signed)
Pt c/o bilateral lower back pain with movement. Pt states he had a similar pain to this recently, and received a steroid shot that made it feel better.

## 2018-03-28 NOTE — ED Provider Notes (Signed)
[facility] Emergency Department Provider Note   ____________________________________________   First MD Initiated Contact with Patient 03/28/18 [phone]     (approximate)  I have reviewed the triage vital signs and the nursing notes.   HISTORY  Chief Complaint Back Pain    HPI Kenneth Morales is a 52 y.o. male patient complain of low back pain for 2 days.  Patient state onset of complaint when he got out of car and felt a "catch" in his back.  Patient denies radicular component to his pain.  Patient denies bladder bowel dysfunction.  No palliative measures for complaint.  Patient rates pain as a 7/10.  Patient described the pain is "aching".  Patient states this is a ongoing problem and occurs once or twice a year and is relieved by steroidal injections.  Past Medical History:  Diagnosis Date  . Acute MI (HCC) MAY 2013  . CAD (coronary artery disease)    NSTEMI in 05/13 in setting of Cocaine use. Cath: 99% mid LCX stenosis with a large thrombus. PCI and BMS (4.0 X15 mm Vision) placement to mid LCX, LAD: 20%, RCA: 30%, EF: 60%.   . Cocaine abuse (HCC)    quit in 08/2011  . Depression   . Hyperlipidemia   . Hypertension   . Spider bite   . Tobacco abuse     Patient Active Problem List   Diagnosis Date Noted  . Unstable angina (HCC) 01/26/2018  . NSTEMI (non-ST elevated myocardial infarction) (HCC) 11/28/2017  . 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 Chest pain 07/01/2016  . SOB (shortness of breath) 12/10/2011  . CAD (coronary artery disease)   . Hyperlipidemia   . Hypertension   . Tobacco abuse   . Cocaine abuse [facility])     Past Surgical History:  Procedure Laterality Date  . CARDIAC CATHETERIZATION  MAY 2013   s/p stent @ [facility]  . CARDIAC CATHETERIZATION    . CORONARY CTO INTERVENTION N/A 01/27/2018   Procedure: CORONARY CTO INTERVENTION;  Surgeon: Corky Crafts, MD;  Location: [facility] INVASIVE CV LAB;  Service: Cardiovascular;   Laterality: N/A;  . CORONARY STENT INTERVENTION N/A 11/29/2017   Procedure: CORONARY STENT INTERVENTION;  Surgeon: Alwyn Pea, MD;  Location: [facility] INVASIVE CV LAB;  Service: Cardiovascular;  Laterality: N/A;  . CORONARY STENT INTERVENTION N/A 01/27/2018   Procedure: CORONARY STENT INTERVENTION;  Surgeon: Corky Crafts, MD;  Location: [facility] INVASIVE CV LAB;  Service: Cardiovascular;  Laterality: N/A;  mid cfx  . HEMORRHOID SURGERY    . LEFT HEART CATH AND CORONARY ANGIOGRAPHY Right 11/29/2017   Procedure: Left heart catheterization with possible PCI;  Surgeon: Laurier Nancy, MD;  Location: [facility] INVASIVE CV LAB;  Service: Cardiovascular;  Laterality: Right;  . LEFT HEART CATH AND CORONARY ANGIOGRAPHY N/A 01/27/2018   Procedure: LEFT HEART CATH AND CORONARY ANGIOGRAPHY;  Surgeon: Corky Crafts, MD;  Location[facility] INVASIVE CV LAB;  Service: Cardiovascular;  Laterality: N/A;    Prior to Admission medications   Medication Sig Start Date End Date Taking? Authorizing Provider  amLODipine (NORVASC) 5 MG tablet Take 1 tablet (5 mg total) by mouth daily. 01/29/18   Strader, Lennart Pall, PA-C  aspirin 81 MG tablet Take 1 tablet (81 mg total) by mouth daily. Patient not taking: Reported on 01/26/2018 07/02/16   Enid Baas, MD  atorvastatin (LIPITOR) 80 MG tablet Take 1 tablet (80 mg total) by mouth daily at 6 PM. 01/28/18   [place] Ouch, Lennart Pall,  PA-C  carvedilol (COREG) 12.5 MG tablet Take 1 tablet (12.5 mg total) by mouth 2 (two) times daily with a meal. 01/28/18   Strader, [place] M, PA-C  hydrALAZINE (APRESOLINE) 25 MG tablet Take 1 tablet (25 mg total) by mouth 2 (two) times daily. 01/28/18   Strader, Lennart Pall, PA-C  nitroGLYCERIN (NITROSTAT) 0.4 MG SL tablet Place 0.4 mg under the tongue as directed. Place 1 tablet (0.4mg  total) under the tongue every 5 minutes as needed for chest pain. May take 3 doses. 01/23/18 01/23/19  [provider]  ticagrelor (BRILINTA) 90 MG TABS tablet  Take 1 tablet (90 mg total) by mouth every 12 (twelve) hours. 01/28/18   Strader, Lennart Pall, PA-C    Allergies Penicillins  Family History  Problem Relation Age of Onset  . Heart attack Maternal Grandfather   . Heart attack Paternal Grandfather     Social History Social History   Tobacco Use  . Smoking status: Current Every Day Smoker    Packs/day: 0.50    Years: 30.00    Pack years: 15.00    Types: Cigarettes  . Smokeless tobacco: Never Used  Substance Use Topics  . Alcohol use: Yes    Alcohol/week: 6.0 standard drinks    Types: 6 Cans of beer per week    Comment: weekly  . Drug use: No    Types: Cocaine    Comment: used cocaine 15-20 years    Review of Systems Constitutional: No fever/chills Eyes: No visual changes. ENT: No sore throat. Cardiovascular: Denies chest pain. Respiratory: Denies shortness of breath. Gastrointestinal: No abdominal pain.  No nausea, no vomiting.  No diarrhea.  No constipation. Genitourinary: Negative for dysuria. Musculoskeletal: Negative for back pain. Skin: Negative for rash. Neurological: Negative for headaches, focal weakness or numbness. Psychiatric:Cocaine abuse. Endocrine:Hyperlipidemia and hypertension. Allergic/Immunilogical: Penicillin ____________________________________________   PHYSICAL EXAM:  VITAL SIGNS: ED Triage Vitals  Enc Vitals Group     BP 03/28/18 0817 (!) 123/94     Pulse Rate 03/28/18 0817 81     Resp 03/28/18 0817 18     Temp 03/28/18 0817 98.5 F (36.9 C)     Temp Source 03/28/18 0817 Oral     SpO2 03/28/18 0817 100 %     Weight 03/28/18 0818 155 lb (70.3 kg)     Height 03/28/18 0818 5\' 10"  (1.778 m)     Head Circumference --      Peak Flow --      Pain Score 03/28/18 0818 7     Pain Loc --      Pain Edu? --      Excl. in GC? --    Constitutional: Alert and oriented. Well appearing and in no acute distress. Cardiovascular: Normal rate, regular rhythm. Grossly normal heart sounds.  Good  peripheral circulation. Respiratory: Normal respiratory effort.  No retractions. Lungs CTAB. Musculoskeletal: No obvious spinal deformity.  Patient has decreased range of motion with right lateral movements.  Patient has left spinal muscle spasms.. Neurologic:  Normal speech and language. No gross focal neurologic deficits are appreciated. No gait instability. Skin:  Skin is warm, dry and intact. No rash noted. Psychiatric: Mood and affect are normal. Speech and behavior are normal.  ____________________________________________   LABS (all labs ordered are listed, but only abnormal results are displayed)  Labs Reviewed - No data to display ____________________________________________  EKG   ____________________________________________  RADIOLOGY  ED MD interpretation:    Official radiology report(s): No results found.  ____________________________________________  PROCEDURES  Procedure(s) performed: None  Procedures  Critical Care performed: No  ____________________________________________   INITIAL IMPRESSION / ASSESSMENT AND PLAN / ED COURSE  As part of my medical decision making, I reviewed the following data within the electronic MEDICAL RECORD NUMBER   Patient presents with non-provoked low back pain for 2 days.  Denies radicular component to his back pain.  Patient given discharge care instructions advised follow-up PCP for continued care.       ____________________________________________   FINAL CLINICAL IMPRESSION(S) / ED DIAGNOSES  Final diagnoses:  Midline low back pain without sciatica, unspecified chronicity     ED Discharge Orders    None       Note:  This document was prepared using Dragon voice recognition software and may include unintentional dictation errors.    Joni ReiningSmith, Jocilynn Grade K, PA-C 03/28/18 [id number]    Jene EveryKinner, Robert, MD 03/28/18 1030

## 2018-07-02 ENCOUNTER — Emergency Department
Admission: EM | Admit: 2018-07-02 | Discharge: 2018-07-02 | Disposition: A | Discharge status: Home / Self Care | Payer: Medicaid Other | Attending: Emergency Medicine | Admitting: Emergency Medicine

## 2018-07-02 ENCOUNTER — Encounter: Payer: Self-pay | Admitting: Emergency Medicine

## 2018-07-02 ENCOUNTER — Other Ambulatory Visit: Payer: Self-pay

## 2018-07-02 DIAGNOSIS — Z79899 Other long term (current) drug therapy: Secondary | ICD-10-CM | POA: Insufficient documentation

## 2018-07-02 DIAGNOSIS — R112 Nausea with vomiting, unspecified: Secondary | ICD-10-CM

## 2018-07-02 DIAGNOSIS — F1410 Cocaine abuse, uncomplicated: Secondary | ICD-10-CM | POA: Diagnosis not present

## 2018-07-02 DIAGNOSIS — I10 Essential (primary) hypertension: Secondary | ICD-10-CM

## 2018-07-02 DIAGNOSIS — I252 Old myocardial infarction: Secondary | ICD-10-CM | POA: Diagnosis not present

## 2018-07-02 DIAGNOSIS — I2510 Atherosclerotic heart disease of native coronary artery without angina pectoris: Secondary | ICD-10-CM | POA: Diagnosis not present

## 2018-07-02 DIAGNOSIS — Z7902 Long term (current) use of antithrombotics/antiplatelets: Secondary | ICD-10-CM | POA: Diagnosis not present

## 2018-07-02 DIAGNOSIS — F17210 Nicotine dependence, cigarettes, uncomplicated: Secondary | ICD-10-CM | POA: Insufficient documentation

## 2018-07-02 DIAGNOSIS — K2900 Acute gastritis without bleeding: Secondary | ICD-10-CM

## 2018-07-02 LAB — HEPATIC FUNCTION PANEL
ALT: 14 U/L (ref 0–44)
AST: 24 U/L (ref 15–41)
Albumin: 3.9 g/dL (ref 3.5–5.0)
Alkaline Phosphatase: 72 U/L (ref 38–126)
Bilirubin, Direct: 0.3 mg/dL — ABNORMAL HIGH (ref 0.0–0.2)
Indirect Bilirubin: 1.1 mg/dL — ABNORMAL HIGH (ref 0.3–0.9)
Total Bilirubin: 1.4 mg/dL — ABNORMAL HIGH (ref 0.3–1.2)
Total Protein: 6.8 g/dL (ref 6.5–8.1)

## 2018-07-02 LAB — CBC WITH DIFFERENTIAL/PLATELET
Abs Immature Granulocytes: 0.02 10*3/uL (ref 0.00–0.07)
Basophils Absolute: 0 10*3/uL (ref 0.0–0.1)
Basophils Relative: 0 %
Eosinophils Absolute: 0.1 10*3/uL (ref 0.0–0.5)
Eosinophils Relative: 1 %
HCT: 48.7 % (ref 39.0–52.0)
Hemoglobin: 16.8 g/dL (ref 13.0–17.0)
Immature Granulocytes: 0 %
Lymphocytes Relative: 14 %
Lymphs Abs: 1.2 10*3/uL (ref 0.7–4.0)
MCH: 33.1 pg (ref 26.0–34.0)
MCHC: 34.5 g/dL (ref 30.0–36.0)
MCV: 95.9 fL (ref 80.0–100.0)
Monocytes Absolute: 0.5 10*3/uL (ref 0.1–1.0)
Monocytes Relative: 6 %
Neutro Abs: 6.8 10*3/uL (ref 1.7–7.7)
Neutrophils Relative %: 79 %
Platelets: 268 10*3/uL (ref 150–400)
RBC: 5.08 MIL/uL (ref 4.22–5.81)
RDW: 12.8 % (ref 11.5–15.5)
WBC: 8.6 10*3/uL (ref 4.0–10.5)
nRBC: 0 % (ref 0.0–0.2)

## 2018-07-02 LAB — BASIC METABOLIC PANEL
Anion gap: 5 (ref 5–15)
BUN: 8 mg/dL (ref 6–20)
CO2: 25 mmol/L (ref 22–32)
Calcium: 7.8 mg/dL — ABNORMAL LOW (ref 8.9–10.3)
Chloride: 111 mmol/L (ref 98–111)
Creatinine, Ser: 0.93 mg/dL (ref 0.61–1.24)
GFR calc Af Amer: >60 mL/min (ref >60)
GFR calc non Af Amer: >60 mL/min (ref >60)
Glucose, Bld: 104 mg/dL — ABNORMAL HIGH (ref 70–99)
Potassium: 3.5 mmol/L (ref 3.5–5.1)
Sodium: 141 mmol/L (ref 135–145)

## 2018-07-02 LAB — TROPONIN I: Troponin I: <0.03 ng/mL (ref <0.03)

## 2018-07-02 LAB — LIPASE, BLOOD: Lipase: 24 U/L (ref 11–51)

## 2018-07-02 MED ORDER — METOCLOPRAMIDE HCL 5 MG/ML IJ SOLN
10.0000 mg | Freq: Once | INTRAMUSCULAR | Status: AC
  Administered 2018-07-02: 10 mg via INTRAVENOUS
  Filled 2018-07-02: qty 2

## 2018-07-02 MED ORDER — ONDANSETRON HCL 4 MG/2ML IJ SOLN
4.0000 mg | Freq: Once | INTRAMUSCULAR | Status: AC
  Administered 2018-07-02: 4 mg via INTRAVENOUS
  Filled 2018-07-02: qty 2

## 2018-07-02 MED ORDER — SODIUM CHLORIDE 0.9 % IV BOLUS
1000.0000 mL | Freq: Once | INTRAVENOUS | Status: AC
  Administered 2018-07-02: 1000 mL via INTRAVENOUS

## 2018-07-02 MED ORDER — HYDRALAZINE HCL 20 MG/ML IJ SOLN
10.0000 mg | Freq: Once | INTRAMUSCULAR | Status: AC
  Administered 2018-07-02: 10 mg via INTRAVENOUS
  Filled 2018-07-02: qty 1

## 2018-07-02 MED ORDER — FAMOTIDINE 20 MG PO TABS: 20.0000 mg | ORAL_TABLET | Freq: Two times a day (BID) | ORAL | 0 refills | Status: DC

## 2018-07-02 MED ORDER — METOCLOPRAMIDE HCL 10 MG PO TABS: 10.0000 mg | ORAL_TABLET | Freq: Four times a day (QID) | ORAL | 0 refills | Status: DC | PRN

## 2018-07-02 NOTE — ED Provider Notes (Signed)
-----------------------------------------   7:21 PM on 07/02/2018 ----------------------------------------- Assumed care of patient from Dr. Marisa Severin for follow-up of labs and symptomatic management of likely gastritis.  Labs unremarkable.  Patient feeling better after GI cocktail and requesting food.  Tolerating oral intake.  Blood pressure improved.  Recommend discharge home, Pepcid and Reglan for likely gastritis, follow-up closely with primary care this week.  Continue home medications.   Sharman Cheek, MD 07/02/18 Ernestina [place]

## 2018-07-02 NOTE — ED Notes (Signed)
Family at bedside. 

## 2018-07-02 NOTE — ED Provider Notes (Signed)
[facility] Emergency Department Provider Note ____________________________________________   First MD Initiated Contact with Patient 07/02/18 1422     (approximate)  I have reviewed the triage vital signs and the nursing notes.   HISTORY  Chief Complaint Nausea and Emesis    HPI Kenneth Morales is a 53 y.o. male with PMH as noted below including CAD with prior MI and stents who presents with nausea and vomiting, acute onset around 8 AM today and persistent since then, nonbloody, and not associated with any abdominal pain or fever.  He has had no diarrhea.  The patient denies any chest pain or shortness of breath.  He does have a mild headache.  He states he has not been able to afford his blood pressure medication so has not been taking them recently.  Past Medical History:  Diagnosis Date  . Acute MI (HCC) MAY 2013  . CAD (coronary artery disease)    NSTEMI in 05/13 in setting of Cocaine use. Cath: 99% mid LCX stenosis with a large thrombus. PCI and BMS (4.0 X15 mm Vision) placement to mid LCX, LAD: 20%, RCA: 30%, EF: 60%.   . Cocaine abuse (HCC)    quit in 08/2011  . Depression   . Hyperlipidemia   . Hypertension   . Spider bite   . Tobacco abuse     Patient Active Problem List   Diagnosis Date Noted  . Unstable angina (HCC) 01/26/2018  . NSTEMI (non-ST elevated myocardial infarction) (HCC) 11/28/2017  . 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 Chest pain 07/01/2016  . SOB (shortness of breath) 12/10/2011  . CAD (coronary artery disease)   . Hyperlipidemia   . Hypertension   . Tobacco abuse   . Cocaine abuse [facility])     Past Surgical History:  Procedure Laterality Date  . CARDIAC CATHETERIZATION  MAY 2013   s/p stent @ [facility]  . CARDIAC CATHETERIZATION    . CORONARY CTO INTERVENTION N/A 01/27/2018   Procedure: CORONARY CTO INTERVENTION;  Surgeon: Corky Crafts, MD;  Location: [facility] INVASIVE CV LAB;  Service: Cardiovascular;   Laterality: N/A;  . CORONARY STENT INTERVENTION N/A 11/29/2017   Procedure: CORONARY STENT INTERVENTION;  Surgeon: Alwyn Pea, MD;  Location: [facility] INVASIVE CV LAB;  Service: Cardiovascular;  Laterality: N/A;  . CORONARY STENT INTERVENTION N/A 01/27/2018   Procedure: CORONARY STENT INTERVENTION;  Surgeon: Corky Crafts, MD;  Location: [facility] INVASIVE CV LAB;  Service: Cardiovascular;  Laterality: N/A;  mid cfx  . HEMORRHOID SURGERY    . LEFT HEART CATH AND CORONARY ANGIOGRAPHY Right 11/29/2017   Procedure: Left heart catheterization with possible PCI;  Surgeon: Laurier Nancy, MD;  Location[facility] INVASIVE CV LAB;  Service: Cardiovascular;  Laterality: Right;  . LEFT HEART CATH AND CORONARY ANGIOGRAPHY N/A 01/27/2018   Procedure: LEFT HEART CATH AND CORONARY ANGIOGRAPHY;  Surgeon: Corky Crafts, MD;  Location: [facility] INVASIVE CV LAB;  Service: Cardiovascular;  Laterality: N/A;    Prior to Admission medications   Medication Sig Start Date End Date Taking? Authorizing Provider  clopidogrel (PLAVIX) 75 MG tablet Take 75 mg by mouth daily. 05/10/18 05/10/19 Yes [provider]  metoprolol tartrate (LOPRESSOR) 25 MG tablet Take 25 mg by mouth 2 (two) times daily. 02/20/18  Yes [provider]  amLODipine (NORVASC) 5 MG tablet Take 1 tablet (5 mg total) by mouth daily. 01/29/18   Strader, Lennart Pall, PA-C  aspirin 81 MG tablet Take 1 tablet (81 mg total)  by mouth daily. Patient not taking: Reported on 01/26/2018 07/02/16   Enid Baas, MD  atorvastatin (LIPITOR) 80 MG tablet Take 1 tablet (80 mg total) by mouth daily at 6 PM. 01/28/18   Strader, Lennart Pall, PA-C  carvedilol (COREG) 12.5 MG tablet Take 1 tablet (12.5 mg total) by mouth 2 (two) times daily with a meal. 01/28/18   Strader, [place] M, PA-C  hydrALAZINE (APRESOLINE) 25 MG tablet Take 1 tablet (25 mg total) by mouth 2 (two) times daily. 01/28/18   Strader, Lennart Pall, PA-C  losartan (COZAAR) 25 MG tablet Take 25 mg  by mouth daily. 01/23/18   [provider]  nitroGLYCERIN (NITROSTAT) 0.4 MG SL tablet Place 0.4 mg under the tongue as directed. Place 1 tablet (0.4mg  total) under the tongue every 5 minutes as needed for chest pain. May take 3 doses. 01/23/18 01/23/19  [provider]  ticagrelor (BRILINTA) 90 MG TABS tablet Take 1 tablet (90 mg total) by mouth every 12 (twelve) hours. 01/28/18   Strader, Lennart Pall, PA-C    Allergies Penicillins  Family History  Problem Relation Age of Onset  . Heart attack Maternal Grandfather   . Heart attack Paternal Grandfather     Social History Social History   Tobacco Use  . Smoking status: Current Every Day Smoker    Packs/day: 0.50    Years: 30.00    Pack years: 15.00    Types: Cigarettes  . Smokeless tobacco: Never Used  Substance Use Topics  . Alcohol use: Yes    Alcohol/week: 6.0 standard drinks    Types: 6 Cans of beer per week    Comment: weekly  . Drug use: No    Types: Cocaine    Comment: used cocaine 15-20 years    Review of Systems  Constitutional: No fever. Eyes: No redness. ENT: No neck pain. Cardiovascular: Denies chest pain. Respiratory: Denies shortness of breath. Gastrointestinal: Positive for nausea and vomiting. Genitourinary: Negative for flank pain.  Musculoskeletal: Negative for back pain. Skin: Negative for rash. Neurological: Positive for headache..   ____________________________________________   PHYSICAL EXAM:  VITAL SIGNS: ED Triage Vitals  Enc Vitals Group     BP 07/02/18 1422 (!) 203/114     Pulse Rate 07/02/18 1422 62     Resp 07/02/18 1422 14     Temp 07/02/18 1422 (!) 97.5 F (36.4 C)     Temp Source 07/02/18 1422 Oral     SpO2 07/02/18 1422 100 %     Weight 07/02/18 1424 150 lb (68 kg)     Height 07/02/18 1424 5\' 10"  (1.778 m)     Head Circumference --      Peak Flow --      Pain Score 07/02/18 1424 0     Pain Loc --      Pain Edu? --      Excl. in GC? --      Constitutional: Alert and oriented.  Uncomfortable appearing but in no acute distress. Eyes: Conjunctivae are normal.  Head: Atraumatic. Nose: No congestion/rhinnorhea. Mouth/Throat: Mucous membranes are slightly dry.   Neck: Normal range of motion.  Cardiovascular: Normal rate, regular rhythm. Grossly normal heart sounds.  Good peripheral circulation. Respiratory: Normal respiratory effort.  No retractions. Lungs CTAB. Gastrointestinal: Soft and nontender. No distention.  Genitourinary: No flank tenderness. Musculoskeletal: No lower extremity edema.  Extremities warm and well perfused.  Neurologic:  Normal speech and language. No gross focal neurologic deficits are appreciated.  Skin:  Skin  is warm and dry. No rash noted. Psychiatric: Mood and affect are normal. Speech and behavior are normal.  ____________________________________________   LABS (all labs ordered are listed, but only abnormal results are displayed)  Labs Reviewed  CBC WITH DIFFERENTIAL/PLATELET  BASIC METABOLIC PANEL  HEPATIC FUNCTION PANEL  LIPASE, BLOOD  TROPONIN I   ____________________________________________  EKG  ED ECG REPORT I, Dionne Bucy, the attending physician, personally viewed and interpreted this ECG.  Date: 07/02/2018 EKG Time: 1421 Rate: 63 Rhythm: normal sinus rhythm QRS Axis: normal Intervals: normal ST/T Wave abnormalities: LVH with marked repolarization abnormality including borderline ST elevation in V2 and V3 and deep T wave inversions in V5 and V6 Narrative Interpretation: Repolarization abnormality and nonspecific ST abnormalities, unchanged from prior EKG of 01/28/2018  ____________________________________________  RADIOLOGY    ____________________________________________   PROCEDURES  Procedure(s) performed: No  Procedures  Critical Care performed: Yes  CRITICAL CARE Performed by: Dionne Bucy   Total critical care time: 40  minutes  Critical care time was exclusive of separately billable procedures and treating other patients.  Critical care was necessary to treat or prevent imminent or life-threatening deterioration.  Critical care was time spent personally by me on the following activities: development of treatment plan with patient and/or surrogate as well as nursing, discussions with consultants, evaluation of patient's response to treatment, examination of patient, obtaining history from patient or surrogate, ordering and performing treatments and interventions, ordering and review of laboratory studies, ordering and review of radiographic studies, pulse oximetry and re-evaluation of patient's condition. ____________________________________________   INITIAL IMPRESSION / ASSESSMENT AND PLAN / ED COURSE  Pertinent labs & imaging results that were available during my care of the patient were reviewed by me and considered in my medical decision making (see chart for details).  53 year old male with PMH as noted above including CAD with prior stents presents with nausea and vomiting for approximately the last 6 hours.  He has no abdominal chest pain.  He reports being noncompliant with his blood pressure medication.  EMS found the patient to be significantly hypertensive.  EKG obtained in the field was concerning for possible acute ischemia or STEMI and was transmitted to [place] prior to the patient's arrival.  Review of the past EKGs shows a similar morphology with repolarization abnormality and both ST elevations and T wave inversions, and since the patient was not having chest pain or other anginal equivalents we did not activate code STEMI.  On ED arrival, the patient continues to have nausea and active dry heaving.  His blood pressure is significantly elevated but his other vital signs are normal.  He is otherwise relatively well-appearing.  EKG here shows similar LVH with marked repolarization abnormality, and is  essentially identical to prior EKG from October of last year.  The abdomen is soft and nontender.  The remainder of the exam is as described above.    Overall presentation is most consistent with GI etiology given the that the patient is having nausea and vomiting with no associated symptoms.  This does not feel like when he had an MI.  Given that the patient has no symptoms of ACS and no acute EKG changes, there is no indication for STEMI activation or emergent cardiology consultation.  It is possible that the patient is having some myocardial strain related to his hypertension so we will obtain cardiac enzymes and keep him on the monitor.  Differential includes acute gastroenteritis, PUD, gastritis, hepatobiliary etiology, or less likely SBO or colitis -  although I have a low suspicion for this given the lack of abdominal pain or tenderness.  We will obtain lab work-up, give symptomatic treatment with Zofran and fluids and reassess.  ----------------------------------------- 3:53 PM on 07/02/2018 -----------------------------------------  The patient's nausea improved although he is now feeling somewhat nauseated again.  He continues to have no chest or abdominal pain.  Blood pressure is somewhat improved after hydralazine.  The CBC was resulted and is normal.  His other labs were apparently canceled and need to be redrawn.  Overall I still suspect most likely gastritis or gastroenteritis.  We will await the results of the lab work-up and give additional antiemetics.  We will continue to monitor the blood pressure.  If he remains significantly hypertensive or has persistent nausea vomiting he may require further observation and admission.  Otherwise if his symptoms resolve it is possible that he will be appropriate for discharge home.  I am signing the patient out to the oncoming physician Dr. Scotty Court. ____________________________________________   FINAL CLINICAL IMPRESSION(S) / ED  DIAGNOSES  Final diagnoses:  Nausea and vomiting, intractability of vomiting not specified, unspecified vomiting type      NEW MEDICATIONS STARTED DURING THIS VISIT:  New Prescriptions   No medications on file     Note:  This document was prepared using Dragon voice recognition software and may include unintentional dictation errors.    Dionne Bucy, MD 07/02/18 1555

## 2018-07-02 NOTE — ED Notes (Signed)
.   Pt is resting, Respirations even and unlabored, NAD. Stretcher lowest postion and locked. Call bell within reach. Denies any needs at this time RN will continue to monitor.    

## 2018-07-02 NOTE — ED Triage Notes (Signed)
Pt to ED via EMS from home c/o n/v that started around 8am this morning, 6 episodes of vomiting, denies pain.  States cardiac hx of MI's and stents placed.  States not taking BP medication like supposed to d/t financial reasons.  EMS BP 190/123 and then 188/111.

## 2018-07-02 NOTE — Discharge Instructions (Addendum)
Take all your medications as prescribed and follow up with your doctor this week for recheck of your blood pressure.  Take pepcid and reglan as needed for nausea and follow a simple diet until you feel back to normal.

## 2018-07-02 NOTE — ED Notes (Signed)
Responded to call bell. Pt requesting water. Dr Scotty Court approved. Pt provided cup of ice water.

## 2018-07-04 ENCOUNTER — Inpatient Hospital Stay: Payer: Medicaid Other

## 2018-07-04 ENCOUNTER — Inpatient Hospital Stay
Admission: EM | Admit: 2018-07-04 | Discharge: 2018-07-07 | DRG: 066 | Disposition: A | Discharge status: Rehabilitation Facility | Payer: Medicaid Other | Attending: Internal Medicine | Admitting: Internal Medicine

## 2018-07-04 ENCOUNTER — Other Ambulatory Visit: Payer: Self-pay

## 2018-07-04 ENCOUNTER — Emergency Department: Payer: Medicaid Other

## 2018-07-04 ENCOUNTER — Encounter: Payer: Self-pay | Admitting: Emergency Medicine

## 2018-07-04 DIAGNOSIS — R269 Unspecified abnormalities of gait and mobility: Secondary | ICD-10-CM | POA: Diagnosis not present

## 2018-07-04 DIAGNOSIS — Z888 Allergy status to other drugs, medicaments and biological substances status: Secondary | ICD-10-CM | POA: Diagnosis not present

## 2018-07-04 DIAGNOSIS — Z9119 Patient's noncompliance with other medical treatment and regimen: Secondary | ICD-10-CM | POA: Diagnosis not present

## 2018-07-04 DIAGNOSIS — I10 Essential (primary) hypertension: Secondary | ICD-10-CM | POA: Diagnosis present

## 2018-07-04 DIAGNOSIS — H532 Diplopia: Secondary | ICD-10-CM | POA: Diagnosis present

## 2018-07-04 DIAGNOSIS — Z9114 Patient's other noncompliance with medication regimen: Secondary | ICD-10-CM

## 2018-07-04 DIAGNOSIS — Z880 Allergy status to penicillin: Secondary | ICD-10-CM

## 2018-07-04 DIAGNOSIS — R7989 Other specified abnormal findings of blood chemistry: Secondary | ICD-10-CM | POA: Diagnosis present

## 2018-07-04 DIAGNOSIS — R270 Ataxia, unspecified: Secondary | ICD-10-CM

## 2018-07-04 DIAGNOSIS — I2510 Atherosclerotic heart disease of native coronary artery without angina pectoris: Secondary | ICD-10-CM | POA: Diagnosis present

## 2018-07-04 DIAGNOSIS — F1910 Other psychoactive substance abuse, uncomplicated: Secondary | ICD-10-CM | POA: Diagnosis not present

## 2018-07-04 DIAGNOSIS — Z955 Presence of coronary angioplasty implant and graft: Secondary | ICD-10-CM

## 2018-07-04 DIAGNOSIS — K5900 Constipation, unspecified: Secondary | ICD-10-CM | POA: Diagnosis present

## 2018-07-04 DIAGNOSIS — R066 Hiccough: Secondary | ICD-10-CM | POA: Diagnosis not present

## 2018-07-04 DIAGNOSIS — H5500 Unspecified nystagmus: Secondary | ICD-10-CM | POA: Diagnosis present

## 2018-07-04 DIAGNOSIS — R29701 NIHSS score 1: Secondary | ICD-10-CM | POA: Diagnosis present

## 2018-07-04 DIAGNOSIS — Z79899 Other long term (current) drug therapy: Secondary | ICD-10-CM | POA: Diagnosis not present

## 2018-07-04 DIAGNOSIS — F1410 Cocaine abuse, uncomplicated: Secondary | ICD-10-CM | POA: Diagnosis present

## 2018-07-04 DIAGNOSIS — R29702 NIHSS score 2: Secondary | ICD-10-CM | POA: Diagnosis not present

## 2018-07-04 DIAGNOSIS — F17210 Nicotine dependence, cigarettes, uncomplicated: Secondary | ICD-10-CM | POA: Diagnosis present

## 2018-07-04 DIAGNOSIS — Z7902 Long term (current) use of antithrombotics/antiplatelets: Secondary | ICD-10-CM | POA: Diagnosis not present

## 2018-07-04 DIAGNOSIS — I639 Cerebral infarction, unspecified: Secondary | ICD-10-CM | POA: Diagnosis present

## 2018-07-04 DIAGNOSIS — R296 Repeated falls: Secondary | ICD-10-CM | POA: Diagnosis present

## 2018-07-04 DIAGNOSIS — R29703 NIHSS score 3: Secondary | ICD-10-CM | POA: Diagnosis not present

## 2018-07-04 DIAGNOSIS — I69393 Ataxia following cerebral infarction: Secondary | ICD-10-CM | POA: Diagnosis not present

## 2018-07-04 DIAGNOSIS — Z7982 Long term (current) use of aspirin: Secondary | ICD-10-CM

## 2018-07-04 DIAGNOSIS — R531 Weakness: Secondary | ICD-10-CM | POA: Diagnosis present

## 2018-07-04 DIAGNOSIS — E785 Hyperlipidemia, unspecified: Secondary | ICD-10-CM | POA: Diagnosis present

## 2018-07-04 DIAGNOSIS — I69398 Other sequelae of cerebral infarction: Secondary | ICD-10-CM | POA: Diagnosis not present

## 2018-07-04 DIAGNOSIS — I252 Old myocardial infarction: Secondary | ICD-10-CM | POA: Diagnosis not present

## 2018-07-04 DIAGNOSIS — I951 Orthostatic hypotension: Secondary | ICD-10-CM | POA: Diagnosis not present

## 2018-07-04 DIAGNOSIS — I69354 Hemiplegia and hemiparesis following cerebral infarction affecting left non-dominant side: Secondary | ICD-10-CM | POA: Diagnosis present

## 2018-07-04 DIAGNOSIS — Z8249 Family history of ischemic heart disease and other diseases of the circulatory system: Secondary | ICD-10-CM

## 2018-07-04 LAB — CBC
HCT: 44.6 % (ref 39.0–52.0)
Hemoglobin: 15.4 g/dL (ref 13.0–17.0)
MCH: 33.2 pg (ref 26.0–34.0)
MCHC: 34.5 g/dL (ref 30.0–36.0)
MCV: 96.1 fL (ref 80.0–100.0)
Platelets: 245 10*3/uL (ref 150–400)
RBC: 4.64 MIL/uL (ref 4.22–5.81)
RDW: 13.1 % (ref 11.5–15.5)
WBC: 7.6 10*3/uL (ref 4.0–10.5)
nRBC: 0 % (ref 0.0–0.2)

## 2018-07-04 LAB — COMPREHENSIVE METABOLIC PANEL
ALT: 16 U/L (ref 0–44)
AST: 24 U/L (ref 15–41)
Albumin: 3.8 g/dL (ref 3.5–5.0)
Alkaline Phosphatase: 66 U/L (ref 38–126)
Anion gap: 6 (ref 5–15)
BUN: 14 mg/dL (ref 6–20)
CO2: 23 mmol/L (ref 22–32)
Calcium: 8.9 mg/dL (ref 8.9–10.3)
Chloride: 108 mmol/L (ref 98–111)
Creatinine, Ser: 0.89 mg/dL (ref 0.61–1.24)
GFR calc Af Amer: >60 mL/min (ref >60)
GFR calc non Af Amer: >60 mL/min (ref >60)
Glucose, Bld: 108 mg/dL — ABNORMAL HIGH (ref 70–99)
Potassium: 3.7 mmol/L (ref 3.5–5.1)
Sodium: 137 mmol/L (ref 135–145)
Total Bilirubin: 0.8 mg/dL (ref 0.3–1.2)
Total Protein: 7.5 g/dL (ref 6.5–8.1)

## 2018-07-04 LAB — GLUCOSE, CAPILLARY: Glucose-Capillary: 108 mg/dL — ABNORMAL HIGH (ref 70–99)

## 2018-07-04 LAB — TROPONIN I
Troponin I: 0.04 ng/mL (ref <0.03)
Troponin I: 0.03 ng/mL (ref <0.03)
Troponin I: 0.03 ng/mL (ref <0.03)

## 2018-07-04 MED ORDER — ACETAMINOPHEN 325 MG PO TABS
650.0000 mg | ORAL_TABLET | ORAL | Status: DC | PRN
  Administered 2018-07-04 – 2018-07-05 (×3): 650 mg via ORAL
  Filled 2018-07-04 (×3): qty 2

## 2018-07-04 MED ORDER — AMLODIPINE BESYLATE 5 MG PO TABS
5.0000 mg | ORAL_TABLET | Freq: Every day | ORAL | Status: DC
  Administered 2018-07-04 – 2018-07-07 (×4): 5 mg via ORAL
  Filled 2018-07-04 (×4): qty 1

## 2018-07-04 MED ORDER — ASPIRIN 81 MG PO CHEW
324.0000 mg | CHEWABLE_TABLET | Freq: Once | ORAL | Status: AC
  Administered 2018-07-04: 324 mg via ORAL
  Filled 2018-07-04: qty 4

## 2018-07-04 MED ORDER — CARVEDILOL 6.25 MG PO TABS: 12.5000 mg | ORAL_TABLET | Freq: Two times a day (BID) | ORAL | Status: DC

## 2018-07-04 MED ORDER — ENOXAPARIN SODIUM 40 MG/0.4ML ~~LOC~~ SOLN
40.0000 mg | SUBCUTANEOUS | Status: DC
  Administered 2018-07-04 – 2018-07-05 (×2): 40 mg via SUBCUTANEOUS
  Filled 2018-07-04 (×2): qty 0.4

## 2018-07-04 MED ORDER — STROKE: EARLY STAGES OF RECOVERY BOOK
Freq: Once | Status: AC
  Administered 2018-07-04: 16:00:00

## 2018-07-04 MED ORDER — IOHEXOL 350 MG/ML SOLN
75.0000 mL | Freq: Once | INTRAVENOUS | Status: AC | PRN
  Administered 2018-07-04: 18:00:00 75 mL via INTRAVENOUS

## 2018-07-04 MED ORDER — CLOPIDOGREL BISULFATE 75 MG PO TABS: 75.0000 mg | ORAL_TABLET | Freq: Every day | ORAL | Status: DC

## 2018-07-04 MED ORDER — ACETAMINOPHEN 650 MG RE SUPP: 650.0000 mg | RECTAL | Status: DC | PRN

## 2018-07-04 MED ORDER — METOPROLOL TARTRATE 25 MG PO TABS
25.0000 mg | ORAL_TABLET | Freq: Two times a day (BID) | ORAL | Status: DC
  Administered 2018-07-04 – 2018-07-07 (×6): 25 mg via ORAL
  Filled 2018-07-04 (×6): qty 1

## 2018-07-04 MED ORDER — TICAGRELOR 90 MG PO TABS
90.0000 mg | ORAL_TABLET | Freq: Two times a day (BID) | ORAL | Status: DC
  Filled 2018-07-04 (×2): qty 1

## 2018-07-04 MED ORDER — HYDRALAZINE HCL 50 MG PO TABS: 25.0000 mg | ORAL_TABLET | Freq: Two times a day (BID) | ORAL | Status: DC

## 2018-07-04 MED ORDER — LOSARTAN POTASSIUM 50 MG PO TABS: 25.0000 mg | ORAL_TABLET | Freq: Every day | ORAL | Status: DC

## 2018-07-04 MED ORDER — NITROGLYCERIN 0.4 MG SL SUBL: 0.4000 mg | SUBLINGUAL_TABLET | SUBLINGUAL | Status: DC

## 2018-07-04 MED ORDER — RANOLAZINE ER 500 MG PO TB12
500.0000 mg | ORAL_TABLET | Freq: Two times a day (BID) | ORAL | Status: DC
  Administered 2018-07-04 – 2018-07-07 (×6): 500 mg via ORAL
  Filled 2018-07-04 (×7): qty 1

## 2018-07-04 MED ORDER — ATORVASTATIN CALCIUM 20 MG PO TABS
80.0000 mg | ORAL_TABLET | Freq: Every day | ORAL | Status: DC
  Administered 2018-07-04 – 2018-07-06 (×3): 80 mg via ORAL
  Filled 2018-07-04 (×2): qty 4

## 2018-07-04 MED ORDER — ACETAMINOPHEN 160 MG/5ML PO SOLN
650.0000 mg | ORAL | Status: DC | PRN
  Filled 2018-07-04: qty 20.3

## 2018-07-04 MED ORDER — ASPIRIN EC 81 MG PO TBEC
81.0000 mg | DELAYED_RELEASE_TABLET | Freq: Every day | ORAL | Status: DC
  Administered 2018-07-05 – 2018-07-07 (×3): 81 mg via ORAL
  Filled 2018-07-04 (×3): qty 1

## 2018-07-04 MED ORDER — SENNOSIDES-DOCUSATE SODIUM 8.6-50 MG PO TABS: 1.0000 | ORAL_TABLET | Freq: Every evening | ORAL | Status: DC | PRN

## 2018-07-04 MED ORDER — FAMOTIDINE 20 MG PO TABS
20.0000 mg | ORAL_TABLET | Freq: Two times a day (BID) | ORAL | Status: DC
  Administered 2018-07-04 – 2018-07-07 (×7): 20 mg via ORAL
  Filled 2018-07-04 (×7): qty 1

## 2018-07-04 NOTE — ED Notes (Signed)
ED TO INPATIENT HANDOFF REPORT  ED Nurse Name and Phone #:  Isaiah Blakes [phone]  S Name/Age/Gender Kenneth Morales 53 y.o. male Room/Bed: ED12A/ED12A  Code Status   Code Status: Full Code  Home/SNF/Other Home Patient oriented to: self, place, time and situation Is this baseline? Yes   Triage Complete: Triage complete  Chief Complaint inablity to walk;sweats  Triage Note Pt pulled out of car. Reports unable to walk.  Legs dragging as family took pt from car to wheelchair.  Pt reports weakness since here Sunday. On brilenta.  Mild right facial droop. Diaphoretic. Able to move legs but cannot walk.  Feels lightheaded/off balance when standing.  C/o headache.     Allergies Allergies  Allergen Reactions  . Brilinta [Ticagrelor] Shortness Of Breath  . Penicillins Rash    Has patient had a PCN reaction causing immediate rash, facial/tongue/throat swelling, SOB or lightheadedness with hypotension: Yes Has patient had a PCN reaction causing severe rash involving mucus membranes or skin necrosis: No Has patient had a PCN reaction that required hospitalization: No Has patient had a PCN reaction occurring within the last 10 years: No If all of the above answers are "NO", then may proceed with Cephalosporin use.    Level of Care/Admitting Diagnosis ED Disposition    ED Disposition Condition Comment   Admit  Hospital Area: [facility] [[id number]]  Level of Care: Med-Surg [16]  Diagnosis: Stroke (cerebrum) [facility]) [id number]  Admitting Physician: Adrian Saran [[id number]]  Attending Physician: MODY, Patricia Pesa [[id number]]  Estimated length of stay: past midnight tomorrow  Certification:: I certify this patient will need inpatient services for at least 2 midnights  PT Class (Do Not Modify): Inpatient [101]  PT Acc Code (Do Not Modify): Private [1]       B Medical/Surgery History Past Medical History:  Diagnosis Date  . Acute MI (HCC) MAY 2013  . CAD (coronary artery  disease)    NSTEMI in 05/13 in setting of Cocaine use. Cath: 99% mid LCX stenosis with a large thrombus. PCI and BMS (4.0 X15 mm Vision) placement to mid LCX, LAD: 20%, RCA: 30%, EF: 60%.   . Cocaine abuse (HCC)    quit in 08/2011  . Depression   . Hyperlipidemia   . Hypertension   . Spider bite   . Tobacco abuse    Past Surgical History:  Procedure Laterality Date  . CARDIAC CATHETERIZATION  MAY 2013   s/p stent @ [facility]  . CARDIAC CATHETERIZATION    . CORONARY CTO INTERVENTION N/A 01/27/2018   Procedure: CORONARY CTO INTERVENTION;  Surgeon: Corky Crafts, MD;  Location: [facility] INVASIVE CV LAB;  Service: Cardiovascular;  Laterality: N/A;  . CORONARY STENT INTERVENTION N/A 11/29/2017   Procedure: CORONARY STENT INTERVENTION;  Surgeon: Alwyn Pea, MD;  Location: [facility] INVASIVE CV LAB;  Service: Cardiovascular;  Laterality: N/A;  . CORONARY STENT INTERVENTION N/A 01/27/2018   Procedure: CORONARY STENT INTERVENTION;  Surgeon: Corky Crafts, MD;  Location: [facility] INVASIVE CV LAB;  Service: Cardiovascular;  Laterality: N/A;  mid cfx  . HEMORRHOID SURGERY    . LEFT HEART CATH AND CORONARY ANGIOGRAPHY Right 11/29/2017   Procedure: Left heart catheterization with possible PCI;  Surgeon: Laurier Nancy, MD;  Location: [facility] INVASIVE CV LAB;  Service: Cardiovascular;  Laterality: Right;  . LEFT HEART CATH AND CORONARY ANGIOGRAPHY N/A 01/27/2018   Procedure: LEFT HEART CATH AND CORONARY ANGIOGRAPHY;  Surgeon: Corky Crafts, MD;  Location: [facility] INVASIVE CV LAB;  Service:  Cardiovascular;  Laterality: N/A;     A IV Location/Drains/Wounds Patient Lines/Drains/Airways Status   Active Line/Drains/Airways    Name:   Placement date:   Placement time:   Site:   Days:   Peripheral IV 07/02/18 Right Antecubital   07/02/18    1432    Antecubital   2          Intake/Output Last 24 hours No intake or output data in the 24 hours ending 07/04/18 1442  Labs/Imaging Results for orders placed  or performed during the hospital encounter of 07/04/18 (from the past 48 hour(s))  Glucose, capillary     Status: Abnormal   Collection Time: 07/04/18 12:56 PM  Result Value Ref Range   Glucose-Capillary 108 (H) 70 - 99 mg/dL  CBC     Status: None   Collection Time: 07/04/18  1:09 PM  Result Value Ref Range   WBC 7.6 4.0 - 10.5 K/uL   RBC 4.64 4.22 - 5.81 MIL/uL   Hemoglobin 15.4 13.0 - 17.0 g/dL   HCT 70.6 23.7 - 62.8 %   MCV 96.1 80.0 - 100.0 fL   MCH 33.2 26.0 - 34.0 pg   MCHC 34.5 30.0 - 36.0 g/dL   RDW 31.5 17.6 - 16.0 %   Platelets 245 150 - 400 K/uL   nRBC 0.0 0.0 - 0.2 %    Comment: Performed at [facility], [address] [place], [place] [address]  Comprehensive metabolic panel     Status: Abnormal   Collection Time: 07/04/18  1:09 PM  Result Value Ref Range   Sodium 137 135 - 145 mmol/L   Potassium 3.7 3.5 - 5.1 mmol/L   Chloride 108 98 - 111 mmol/L   CO2 23 22 - 32 mmol/L   Glucose, Bld 108 (H) 70 - 99 mg/dL   BUN 14 6 - 20 mg/dL   Creatinine, Ser 6.26 0.61 - 1.24 mg/dL   Calcium 8.9 8.9 - 94.8 mg/dL   Total Protein 7.5 6.5 - 8.1 g/dL   Albumin 3.8 3.5 - 5.0 g/dL   AST 24 15 - 41 U/L   ALT 16 0 - 44 U/L   Alkaline Phosphatase 66 38 - 126 U/L   Total Bilirubin 0.8 0.3 - 1.2 mg/dL   GFR calc non Af Amer >60 >60 mL/min   GFR calc Af Amer >60 >60 mL/min   Anion gap 6 5 - 15    Comment: Performed at [facility], [address] [place], [place] [address]  Troponin I - ONCE - STAT     Status: Abnormal   Collection Time: 07/04/18  1:09 PM  Result Value Ref Range   Troponin I 0.04 (HH) <0.03 ng/mL    Comment: CRITICAL RESULT CALLED TO, READ BACK BY AND VERIFIED WITH Tory Emerald RN 07/04/2018 @ 1355 RDW/SDR Performed at [facility], [address] [place], [place] [address]    Ct Head Wo Contrast  Result Date: 07/04/2018 CLINICAL DATA:  53 year old male with weakness and ataxia for 2 days. EXAM: CT HEAD WITHOUT CONTRAST  TECHNIQUE: Contiguous axial images were obtained from the base of the skull through the vertex without intravenous contrast. COMPARISON:  08/15/2014 and CT FINDINGS: Brain: Small to moderate RIGHT cerebellar infarct is noted without hemorrhage, new since 08/15/2014. No other infarct identified. No mass lesion, midline shift, hydrocephalus or extra-axial collection identified. Vascular: No hyperdense vessel or unexpected calcification. Skull: Normal. Negative for fracture or focal lesion. Sinuses/Orbits: No acute finding.  Other: None. 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  No hemorrhage. Electronically Signed   By: Harmon PierJeffrey  Hu M.D.   On: 07/04/2018 13:42    Pending Labs Unresulted Labs (From admission, onward)    Start     Ordered   07/11/18 0500  Creatinine, serum  (enoxaparin (LOVENOX)    CrCl >/= 30 ml/min)  Weekly,   STAT    Comments:  while on enoxaparin therapy    07/04/18 1426   07/05/18 0500  Hemoglobin A1c  Tomorrow morning,   STAT     07/04/18 1426   07/05/18 0500  Lipid panel  Tomorrow morning,   STAT    Comments:  Fasting    07/04/18 1426   07/04/18 1423  CBC  (enoxaparin (LOVENOX)    CrCl >/= 30 ml/min)  Once,   STAT    Comments:  Baseline for enoxaparin therapy IF NOT ALREADY DRAWN.  Notify MD if PLT < 100 K.    07/04/18 1426   07/04/18 1423  Creatinine, serum  (enoxaparin (LOVENOX)    CrCl >/= 30 ml/min)  Once,   STAT    Comments:  Baseline for enoxaparin therapy IF NOT ALREADY DRAWN.    07/04/18 1426   07/04/18 1321  Urinalysis, Complete w Microscopic  Once,   STAT     07/04/18 1320          Vitals/Pain Today's Vitals   07/04/18 1305 07/04/18 1308 07/04/18 1318 07/04/18 1415  BP:  (!) 156/88  (!) 141/106  Pulse:  (!) 57    Resp:  17  10  Temp: 98.3 F (36.8 C)     SpO2:  98%    Weight:      Height:      PainSc:   5      Isolation Precautions No active isolations  Medications Medications  aspirin EC tablet 81 mg (has no administration  in time range)  nitroGLYCERIN (NITROSTAT) SL tablet 0.4 mg (has no administration in time range)  atorvastatin (LIPITOR) tablet 80 mg (has no administration in time range)  ticagrelor (BRILINTA) tablet 90 mg (has no administration in time range)  amLODipine (NORVASC) tablet 5 mg (has no administration in time range)  famotidine (PEPCID) tablet 20 mg (has no administration in time range)   stroke: mapping our early stages of recovery book (has no administration in time range)  acetaminophen (TYLENOL) tablet 650 mg (has no administration in time range)    Or  acetaminophen (TYLENOL) solution 650 mg (has no administration in time range)    Or  acetaminophen (TYLENOL) suppository 650 mg (has no administration in time range)  senna-docusate (Senokot-S) tablet 1 tablet (has no administration in time range)  enoxaparin (LOVENOX) injection 40 mg (has no administration in time range)  metoprolol tartrate (LOPRESSOR) tablet 25 mg (has no administration in time range)  aspirin chewable tablet 324 mg (324 mg Oral Given 07/04/18 1415)    Mobility walks Low fall risk   Focused Assessments Cardiac Assessment Handoff:  Cardiac Rhythm: Sinus bradycardia, Normal sinus rhythm Lab Results  Component Value Date   CKTOTAL 194 10/05/2012   CKMB 0.9 10/05/2012   TROPONINI 0.04 (HH) 07/04/2018   No results found for: DDIMER Does the Patient currently have chest pain? No  , Neuro Assessment Handoff:  Swallow screen pass? Yes  Cardiac Rhythm: Sinus bradycardia, Normal sinus rhythm       Neuro Assessment: Exceptions to WDL Neuro Checks:      Last Documented NIHSS  Modified Score:   Has TPA been given? No  If patient is a Neuro Trauma and patient is going to OR before floor call report to 4N Charge nurse: [phone] or [phone]     R Recommendations: See Admitting Provider Note  Report given to:   Additional Notes:

## 2018-07-04 NOTE — H&P (Signed)
[facility] [place] [facility]   PATIENT NAME: Kenneth Morales    MR#:  [id number]  DATE OF BIRTH:  1965/06/24  DATE OF ADMISSION:  07/04/2018  PRIMARY CARE PHYSICIAN: [facility]   REQUESTING/REFERRING PHYSICIAN: [email or web address]  CHIEF COMPLAINT:   Dizziness falls HISTORY OF PRESENT ILLNESS:  Kenneth Morales  is a 53 y.o. male with a known history of CAD status post PCI not compliant with Brilinta due to feeling shortness of breath, essential hypertension and tobacco dependence who presents to the emergency room due to above complaint.  Patient was evaluated in the emergency room a few days ago for nausea and dizziness.  He was discharged home.  Patient reports since that time he has had increasing dizziness and numerous falls.  CT shows subacute cerebellar infarct.  PAST MEDICAL HISTORY:   Past Medical History:  Diagnosis Date  . Acute MI (HCC) MAY 2013  . CAD (coronary artery disease)    NSTEMI in 05/13 in setting of Cocaine use. Cath: 99% mid LCX stenosis with a large thrombus. PCI and BMS (4.0 X15 mm Vision) placement to mid LCX, LAD: 20%, RCA: 30%, EF: 60%.   . Cocaine abuse (HCC)    quit in 08/2011  . Depression   . Hyperlipidemia   . Hypertension   . Spider bite   . Tobacco abuse     PAST SURGICAL HISTORY:   Past Surgical History:  Procedure Laterality Date  . CARDIAC CATHETERIZATION  MAY 2013   s/p stent @ [facility]  . CARDIAC CATHETERIZATION    . CORONARY CTO INTERVENTION N/A 01/27/2018   Procedure: CORONARY CTO INTERVENTION;  Surgeon: Corky Crafts, MD;  Location: [facility] INVASIVE CV LAB;  Service: Cardiovascular;  Laterality: N/A;  . CORONARY STENT INTERVENTION N/A 11/29/2017   Procedure: CORONARY STENT INTERVENTION;  Surgeon: Alwyn Pea, MD;  Location: [facility] INVASIVE CV LAB;  Service: Cardiovascular;  Laterality: N/A;  . CORONARY STENT INTERVENTION N/A 01/27/2018   Procedure: CORONARY STENT INTERVENTION;  Surgeon:  Corky Crafts, MD;  Location: [facility] INVASIVE CV LAB;  Service: Cardiovascular;  Laterality: N/A;  mid cfx  . HEMORRHOID SURGERY    . LEFT HEART CATH AND CORONARY ANGIOGRAPHY Right 11/29/2017   Procedure: Left heart catheterization with possible PCI;  Surgeon: Laurier Nancy, MD;  Location: [facility] INVASIVE CV LAB;  Service: Cardiovascular;  Laterality: Right;  . LEFT HEART CATH AND CORONARY ANGIOGRAPHY N/A 01/27/2018   Procedure: LEFT HEART CATH AND CORONARY ANGIOGRAPHY;  Surgeon: Corky Crafts, MD;  Location: [facility] INVASIVE CV LAB;  Service: Cardiovascular;  Laterality: N/A;    SOCIAL HISTORY:   Social History   Tobacco Use  . Smoking status: Current Every Day Smoker    Packs/day: 0.50    Years: 30.00    Pack years: 15.00    Types: Cigarettes  . Smokeless tobacco: Never Used  Substance Use Topics  . Alcohol use: Yes    Alcohol/week: 6.0 standard drinks    Types: 6 Cans of beer per week    Comment: weekly    FAMILY HISTORY:   Family History  Problem Relation Age of Onset  . Heart attack Maternal Grandfather   . Heart attack Paternal Grandfather     DRUG ALLERGIES:   Allergies  Allergen Reactions  . Penicillins Rash    Has patient had a PCN reaction causing immediate rash, facial/tongue/throat swelling, SOB or lightheadedness with hypotension: Yes Has patient had a PCN reaction causing severe rash  involving mucus membranes or skin necrosis: No Has patient had a PCN reaction that required hospitalization: No Has patient had a PCN reaction occurring within the last 10 years: No If all of the above answers are "NO", then may proceed with Cephalosporin use.    REVIEW OF SYSTEMS:   Review of Systems  Constitutional: Positive for malaise/fatigue. Negative for chills and fever.  HENT: Negative.  Negative for ear discharge, ear pain, hearing loss, nosebleeds and sore throat.   Eyes: Negative.  Negative for blurred vision and pain.  Respiratory: Negative.  Negative for  cough, hemoptysis, shortness of breath and wheezing.   Cardiovascular: Negative.  Negative for chest pain, palpitations and leg swelling.  Gastrointestinal: Negative.  Negative for abdominal pain, blood in stool, diarrhea, nausea and vomiting.  Genitourinary: Negative.  Negative for dysuria.  Musculoskeletal: Positive for falls. Negative for back pain.  Skin: Negative.   Neurological: Positive for dizziness, weakness and headaches. Negative for tremors, speech change, focal weakness and seizures.  Endo/Heme/Allergies: Negative.  Does not bruise/bleed easily.  Psychiatric/Behavioral: Negative.  Negative for depression, hallucinations and suicidal ideas.    MEDICATIONS AT HOME:   Prior to Admission medications   Medication Sig Start Date End Date Taking? Authorizing Provider  amLODipine (NORVASC) 5 MG tablet Take 1 tablet (5 mg total) by mouth daily. 01/29/18   Strader, Lennart Pall, PA-C  aspirin 81 MG tablet Take 1 tablet (81 mg total) by mouth daily. Patient not taking: Reported on 01/26/2018 07/02/16   Enid Baas, MD  atorvastatin (LIPITOR) 80 MG tablet Take 1 tablet (80 mg total) by mouth daily at 6 PM. 01/28/18   Strader, Lennart Pall, PA-C  carvedilol (COREG) 12.5 MG tablet Take 1 tablet (12.5 mg total) by mouth 2 (two) times daily with a meal. 01/28/18   Strader, [place] M, PA-C  clopidogrel (PLAVIX) 75 MG tablet Take 75 mg by mouth daily. 05/10/18 05/10/19  [provider]  famotidine (PEPCID) 20 MG tablet Take 1 tablet (20 mg total) by mouth 2 (two) times daily. 07/02/18   Sharman Cheek, MD  hydrALAZINE (APRESOLINE) 25 MG tablet Take 1 tablet (25 mg total) by mouth 2 (two) times daily. 01/28/18   Strader, Lennart Pall, PA-C  losartan (COZAAR) 25 MG tablet Take 25 mg by mouth daily. 01/23/18   [provider]  metoCLOPramide (REGLAN) 10 MG tablet Take 1 tablet (10 mg total) by mouth every 6 (six) hours as needed. 07/02/18   Sharman Cheek, MD  metoprolol tartrate  (LOPRESSOR) 25 MG tablet Take 25 mg by mouth 2 (two) times daily. 02/20/18   [provider]  nitroGLYCERIN (NITROSTAT) 0.4 MG SL tablet Place 0.4 mg under the tongue as directed. Place 1 tablet (0.4mg  total) under the tongue every 5 minutes as needed for chest pain. May take 3 doses. 01/23/18 01/23/19  [provider]  ticagrelor (BRILINTA) 90 MG TABS tablet Take 1 tablet (90 mg total) by mouth every 12 (twelve) hours. 01/28/18   Strader, Lennart Pall, PA-C      VITAL SIGNS:  Blood pressure (!) 141/106, pulse (!) 57, temperature 98.3 F (36.8 C), resp. rate 10, height 5\' 10"  (1.778 m), weight 68 kg, SpO2 98 %.  PHYSICAL EXAMINATION:   Physical Exam Constitutional:      General: He is not in acute distress. HENT:     Head: Normocephalic.  Eyes:     General: No scleral icterus.    Extraocular Movements:     Right eye: Nystagmus present.  Neck:     Musculoskeletal: Normal range of motion and neck supple.     Vascular: No JVD.     Trachea: No tracheal deviation.  Cardiovascular:     Rate and Rhythm: Normal rate and regular rhythm.     Heart sounds: Normal heart sounds. No murmur. No friction rub. No gallop.   Pulmonary:     Effort: Pulmonary effort is normal. No respiratory distress.     Breath sounds: Normal breath sounds. No wheezing or rales.  Chest:     Chest wall: No tenderness.  Abdominal:     General: Bowel sounds are normal. There is no distension.     Palpations: Abdomen is soft. There is no mass.     Tenderness: There is no abdominal tenderness. There is no guarding or rebound.  Musculoskeletal: Normal range of motion.  Skin:    General: Skin is warm.     Findings: No erythema or rash.  Neurological:     Mental Status: He is alert and oriented to person, place, and time.  Psychiatric:        Judgment: Judgment normal.       LABORATORY PANEL:   CBC Recent Labs  Lab 07/04/18 1309  WBC 7.6  HGB 15.4  HCT 44.6  PLT 245    ------------------------------------------------------------------------------------------------------------------  Chemistries  Recent Labs  Lab 07/04/18 1309  NA 137  K 3.7  CL 108  CO2 23  GLUCOSE 108*  BUN 14  CREATININE 0.89  CALCIUM 8.9  AST 24  ALT 16  ALKPHOS 66  BILITOT 0.8   ------------------------------------------------------------------------------------------------------------------  Cardiac Enzymes Recent Labs  Lab 07/04/18 1309  TROPONINI 0.04*   ------------------------------------------------------------------------------------------------------------------  RADIOLOGY:  Ct Head Wo Contrast  Result Date: 07/04/2018 CLINICAL DATA:  53 year old male with weakness and ataxia for 2 days. EXAM: CT HEAD WITHOUT CONTRAST TECHNIQUE: Contiguous axial images were obtained from the base of the skull through the vertex without intravenous contrast. COMPARISON:  08/15/2014 and CT FINDINGS: Brain: Small to moderate RIGHT cerebellar infarct is noted without hemorrhage, new since 08/15/2014. No other infarct identified. No mass lesion, midline shift, hydrocephalus or extra-axial collection identified. Vascular: No hyperdense vessel or unexpected calcification. Skull: Normal. Negative for fracture or focal lesion. Sinuses/Orbits: No acute finding. Other: None. 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  No hemorrhage. Electronically Signed   By: Harmon Pier M.D.   On: 07/04/2018 13:42    EKG:   Orders placed or performed during the hospital encounter of 07/04/18  . EKG 12-Lead  . EKG 12-Lead  . EKG 12-Lead  . EKG 12-Lead  . ED EKG  . ED EKG  . ED EKG  . ED EKG    IMPRESSION AND PLAN:   53 year old male with history of CAD status post PCI, essential hypertension, tobacco dependence who presents to the emergency room due to falls.  1.  Subacute right cerebellar infarct: This is etiology of his falls.  Patient has nystagmus on physical exam. Continue  CVA work-up including MRI, MRA, echocardiogram and carotid Doppler. Continue aspirin and Brilinta for now( From previous cardiology note due to patient symptoms or shortness of breath they were considering changing from Brilinta to Plavix however this has not been done according to the patient). Check lipid panel and A1c Discussed case with Dr. Thad Ranger PT, OT and speech consultation  2.  History of CAD status post PCI: Continue aspirin and Brilinta Continue metoprolol Continue Ranexa Tinea statin   3.Tobacco dependence: Patient is encouraged to  quit smoking and willing to attempt to quit was assessed. Patient highly motivated.Counseling was provided for 4 minutes.  4.  Hypertension: Since this is a subacute stroke I will continue outpatient medications including Norvasc and metoprolol.  5.  Elevated troponin: Continue telemetry and troponin monitoring He has seen [facility] cardiology in the past All the records are reviewed and case discussed with ED provider. Management plans discussed with the patient and he is in agreement  CODE STATUS: FULL  TOTAL TIME TAKING CARE OF THIS PATIENT: 45 minutes.    Gloria Lambertson M.D on 07/04/2018 at 2:37 PM  Between 7am to 6pm - Pager - [phone]  After 6pm go to [email or web address] - Social research officer, government  [facility]  Office  [phone]  CC: Primary care physician; [facility]

## 2018-07-04 NOTE — ED Provider Notes (Signed)
[facility] Emergency Department Provider Note  Time seen: 1:20 PM  I have reviewed the triage vital signs and the nursing notes.   HISTORY  Chief Complaint Weakness    HPI Kenneth Morales is a 53 y.o. male with a past medical history of MI, CAD, stents, depression, hypertension, hyperlipidemia, presents to the emergency department with falls and generalized weakness.  According to the patient since Sunday he has been feeling very weak with frequent falls.  States he was seen in the emergency department 2 days ago, at that time was very nauseated found to have a very high blood pressure.  Patient was treated for presumed gastritis and ultimately discharged home.  Patient states since going home he is largely remained in bed because he feels so weak and off balance.  Attempted to get up today and fell once again.  Patient states he cannot stand due to weakness which is why he presented back to the emergency department today.   Past Medical History:  Diagnosis Date  . Acute MI (HCC) MAY 2013  . CAD (coronary artery disease)    NSTEMI in 05/13 in setting of Cocaine use. Cath: 99% mid LCX stenosis with a large thrombus. PCI and BMS (4.0 X15 mm Vision) placement to mid LCX, LAD: 20%, RCA: 30%, EF: 60%.   . Cocaine abuse (HCC)    quit in 08/2011  . Depression   . Hyperlipidemia   . Hypertension   . Spider bite   . Tobacco abuse     Patient Active Problem List   Diagnosis Date Noted  . Unstable angina (HCC) 01/26/2018  . NSTEMI (non-ST elevated myocardial infarction) (HCC) 11/28/2017  . 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 Chest pain 07/01/2016  . SOB (shortness of breath) 12/10/2011  . CAD (coronary artery disease)   . Hyperlipidemia   . Hypertension   . Tobacco abuse   . Cocaine abuse [facility])     Past Surgical History:  Procedure Laterality Date  . CARDIAC CATHETERIZATION  MAY 2013   s/p stent @ [facility]  . CARDIAC CATHETERIZATION    . CORONARY  CTO INTERVENTION N/A 01/27/2018   Procedure: CORONARY CTO INTERVENTION;  Surgeon: Corky Crafts, MD;  Location: [facility] INVASIVE CV LAB;  Service: Cardiovascular;  Laterality: N/A;  . CORONARY STENT INTERVENTION N/A 11/29/2017   Procedure: CORONARY STENT INTERVENTION;  Surgeon: Alwyn Pea, MD;  Location: [facility] INVASIVE CV LAB;  Service: Cardiovascular;  Laterality: N/A;  . CORONARY STENT INTERVENTION N/A 01/27/2018   Procedure: CORONARY STENT INTERVENTION;  Surgeon: Corky Crafts, MD;  Location: [facility] INVASIVE CV LAB;  Service: Cardiovascular;  Laterality: N/A;  mid cfx  . HEMORRHOID SURGERY    . LEFT HEART CATH AND CORONARY ANGIOGRAPHY Right 11/29/2017   Procedure: Left heart catheterization with possible PCI;  Surgeon: Laurier Nancy, MD;  Location: [facility] INVASIVE CV LAB;  Service: Cardiovascular;  Laterality: Right;  . LEFT HEART CATH AND CORONARY ANGIOGRAPHY N/A 01/27/2018   Procedure: LEFT HEART CATH AND CORONARY ANGIOGRAPHY;  Surgeon: Corky Crafts, MD;  Location: [facility] INVASIVE CV LAB;  Service: Cardiovascular;  Laterality: N/A;    Prior to Admission medications   Medication Sig Start Date End Date Taking? Authorizing Provider  amLODipine (NORVASC) 5 MG tablet Take 1 tablet (5 mg total) by mouth daily. 01/29/18   Strader, Lennart Pall, PA-C  aspirin 81 MG tablet Take 1 tablet (81 mg total) by mouth daily. Patient not taking: Reported on 01/26/2018 07/02/16  Enid Baas, MD  atorvastatin (LIPITOR) 80 MG tablet Take 1 tablet (80 mg total) by mouth daily at 6 PM. 01/28/18   Strader, Lennart Pall, PA-C  carvedilol (COREG) 12.5 MG tablet Take 1 tablet (12.5 mg total) by mouth 2 (two) times daily with a meal. 01/28/18   Strader, [place] M, PA-C  clopidogrel (PLAVIX) 75 MG tablet Take 75 mg by mouth daily. 05/10/18 05/10/19  [provider]  famotidine (PEPCID) 20 MG tablet Take 1 tablet (20 mg total) by mouth 2 (two) times daily. 07/02/18   Sharman Cheek, MD  hydrALAZINE  (APRESOLINE) 25 MG tablet Take 1 tablet (25 mg total) by mouth 2 (two) times daily. 01/28/18   Strader, Lennart Pall, PA-C  losartan (COZAAR) 25 MG tablet Take 25 mg by mouth daily. 01/23/18   [provider]  metoCLOPramide (REGLAN) 10 MG tablet Take 1 tablet (10 mg total) by mouth every 6 (six) hours as needed. 07/02/18   Sharman Cheek, MD  metoprolol tartrate (LOPRESSOR) 25 MG tablet Take 25 mg by mouth 2 (two) times daily. 02/20/18   [provider]  nitroGLYCERIN (NITROSTAT) 0.4 MG SL tablet Place 0.4 mg under the tongue as directed. Place 1 tablet (0.4mg  total) under the tongue every 5 minutes as needed for chest pain. May take 3 doses. 01/23/18 01/23/19  [provider]  ticagrelor (BRILINTA) 90 MG TABS tablet Take 1 tablet (90 mg total) by mouth every 12 (twelve) hours. 01/28/18   Strader, Lennart Pall, PA-C    Allergies  Allergen Reactions  . Penicillins Rash    Has patient had a PCN reaction causing immediate rash, facial/tongue/throat swelling, SOB or lightheadedness with hypotension: Yes Has patient had a PCN reaction causing severe rash involving mucus membranes or skin necrosis: No Has patient had a PCN reaction that required hospitalization: No Has patient had a PCN reaction occurring within the last 10 years: No If all of the above answers are "NO", then may proceed with Cephalosporin use.    Family History  Problem Relation Age of Onset  . Heart attack Maternal Grandfather   . Heart attack Paternal Grandfather     Social History Social History   Tobacco Use  . Smoking status: Current Every Day Smoker    Packs/day: 0.50    Years: 30.00    Pack years: 15.00    Types: Cigarettes  . Smokeless tobacco: Never Used  Substance Use Topics  . Alcohol use: Yes    Alcohol/week: 6.0 standard drinks    Types: 6 Cans of beer per week    Comment: weekly  . Drug use: No    Types: Cocaine    Comment: used cocaine 15-20 years    Review of  Systems Constitutional: Negative for fever. ENT: Negative for recent illness/congestion Cardiovascular: Negative for chest pain. Respiratory: Negative for shortness of breath. Gastrointestinal: Negative for abdominal pain.  Denies any current nausea vomiting or diarrhea. Genitourinary: Negative for urinary compaints Musculoskeletal: Negative for musculoskeletal complaints Skin: Negative for skin complaints  Neurological: Negative for headache.  States generalized weakness. All other ROS negative  ____________________________________________   PHYSICAL EXAM:  VITAL SIGNS: ED Triage Vitals  Enc Vitals Group     BP 07/04/18 1308 (!) 156/88     Pulse Rate 07/04/18 1308 (!) 57     Resp 07/04/18 1308 17     Temp 07/04/18 1305 98.3 F (36.8 C)     Temp src --      SpO2 07/04/18 1308 98 %  Weight 07/04/18 1304 150 lb (68 kg)     Height 07/04/18 1304 5\' 10"  (1.778 m)     Head Circumference --      Peak Flow --      Pain Score 07/04/18 1301 5     Pain Loc --      Pain Edu? --      Excl. in GC? --    Constitutional: Alert and oriented. Well appearing and in no distress. Eyes: Normal exam ENT   Head: Normocephalic and atraumatic.   Mouth/Throat: Mucous membranes are moist. Cardiovascular: Normal rate, regular rhythm.  Respiratory: Normal respiratory effort without tachypnea nor retractions. Breath sounds are clear Gastrointestinal: Soft and nontender. No distention.  Musculoskeletal: Nontender with normal range of motion in all extremities.  Neurologic: Normal speech and language.  Patient has equal grip strength bilaterally.  No pronator drift.  No lower extremity drift.  5/5 motor in all extremities.  No apparent cranial nerve deficits.  However when attempting to ambulate the patient he is barely able to hold his own weight, falls to the right.  Unable to ambulate without significant assistance by multiple people. Skin:  Skin is warm, dry and intact.  Psychiatric: Mood  and affect are normal. Speech and behavior are normal.   ____________________________________________    EKG  EKG viewed and interpreted by myself shows a sinus bradycardia at 58 bpm with a narrow QRS, normal axis, normal intervals.  Patient has inferolateral T wave inversions.  However this is largely unchanged from prior EKG.  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 No hemorrhage.  ____________________________________________   INITIAL IMPRESSION / ASSESSMENT AND PLAN / ED COURSE  Pertinent labs & imaging results that were available during my care of the patient were reviewed by me and considered in my medical decision making (see chart for details).  Patient presents to the emergency department for generalized weakness and recurrent falls.  Differential at this time would include CVA, metabolic or electrolyte abnormality, neurologic lesion, ICH, infectious etiology.  We will check labs, CT scan of the head, continue to very closely monitor.  EKG performed although quite abnormal it is unchanged from prior.  Troponin pending.  No chest pain or shortness of breath.  CT scan consistent with acute/subacute right cerebellar infarct.  This is likely the cause of the patient's significant ataxia on exam, inability to ambulate and frequent falls.  Will admit to the hospitalist service for further work-up.  Patient is well outside of any TPA window.  NIH Stroke Scale   Interval: Baseline Time: 1:49 PM Person Administering Scale: Minna Antis  Administer stroke scale items in the order listed. Record performance in each category after each subscale exam. Do not go back and change scores. Follow directions provided for each exam technique. Scores should reflect what the patient does, not what the clinician thinks the patient can do. The clinician should record answers while administering the exam and work  quickly. Except where indicated, the patient should not be coached (i.e., repeated requests to patient to make a special effort).   1a  Level of consciousness: 0=alert; keenly responsive  1b. LOC questions:  0=Performs both tasks correctly  1c. LOC commands: 0=Performs both tasks correctly  2.  Best Gaze: 0  3.  Visual: 0=No visual loss  4. Facial Palsy: 0=Normal symmetric movement  5a.  Motor left arm: 0=No drift, limb holds 90 (or 45) degrees for full 10 seconds  5b.  Motor right arm: 0=No drift, limb holds 90 (or 45) degrees for full 10 seconds  6a. motor left leg: 0=No drift, limb holds 90 (or 45) degrees for full 10 seconds  6b  Motor right leg:  0=No drift, limb holds 90 (or 45) degrees for full 10 seconds  7. Limb Ataxia: 2=Present in two limbs  8.  Sensory: 0=Normal; no sensory loss  9. Best Language:  0=No aphasia, normal  10. Dysarthria: 0=Normal  11. Extinction and Inattention: 0=No abnormality  12. Distal motor function: 0=Normal   Total:   2     ____________________________________________   FINAL CLINICAL IMPRESSION(S) / ED DIAGNOSES  Weakness Ataxia Cerebellar infarct   Minna AntisPaduchowski, Kartel Wolbert, MD 07/04/18 1351

## 2018-07-04 NOTE — Consult Note (Signed)
Referring Physician: Mody    Chief Complaint: Gait instability, blurred vision  HPI: Kenneth Morales is an 53 y.o. male with a history of HTN, noncompliant with medications, who reports awakening on 3/8 with nausea, vomiting and gait instability.  Was seen in the ED and felt to have a viral illness.  Patient was sent home but did not improve and returns today to the ED.  Also reports that today has noticed that his vision is blurry.  Was not blurry prior to today.   Initial NIHSS of 1.  Date last known well: Date: 07/02/2018 Time last known well: Time: 02:00 tPA Given: No: Outside time window  Past Medical History:  Diagnosis Date  . Acute MI (HCC) MAY 2013  . CAD (coronary artery disease)    NSTEMI in 05/13 in setting of Cocaine use. Cath: 99% mid LCX stenosis with a large thrombus. PCI and BMS (4.0 X15 mm Vision) placement to mid LCX, LAD: 20%, RCA: 30%, EF: 60%.   . Cocaine abuse (HCC)    quit in 08/2011  . Depression   . Hyperlipidemia   . Hypertension   . Spider bite   . Tobacco abuse     Past Surgical History:  Procedure Laterality Date  . CARDIAC CATHETERIZATION  MAY 2013   s/p stent @ [facility]  . CARDIAC CATHETERIZATION    . CORONARY CTO INTERVENTION N/A 01/27/2018   Procedure: CORONARY CTO INTERVENTION;  Surgeon: Corky Crafts, MD;  Location: [facility] INVASIVE CV LAB;  Service: Cardiovascular;  Laterality: N/A;  . CORONARY STENT INTERVENTION N/A 11/29/2017   Procedure: CORONARY STENT INTERVENTION;  Surgeon: Alwyn Pea, MD;  Location: [facility] INVASIVE CV LAB;  Service: Cardiovascular;  Laterality: N/A;  . CORONARY STENT INTERVENTION N/A 01/27/2018   Procedure: CORONARY STENT INTERVENTION;  Surgeon: Corky Crafts, MD;  Location: [facility] INVASIVE CV LAB;  Service: Cardiovascular;  Laterality: N/A;  mid cfx  . HEMORRHOID SURGERY    . LEFT HEART CATH AND CORONARY ANGIOGRAPHY Right 11/29/2017   Procedure: Left heart catheterization with possible PCI;  Surgeon: Laurier Nancy,  MD;  Location: [facility] INVASIVE CV LAB;  Service: Cardiovascular;  Laterality: Right;  . LEFT HEART CATH AND CORONARY ANGIOGRAPHY N/A 01/27/2018   Procedure: LEFT HEART CATH AND CORONARY ANGIOGRAPHY;  Surgeon: Corky Crafts, MD;  Location: [facility] INVASIVE CV LAB;  Service: Cardiovascular;  Laterality: N/A;    Family History  Problem Relation Age of Onset  . Heart attack Maternal Grandfather   . Heart attack Paternal Grandfather    Social History:  reports that he has been smoking cigarettes. He has a 15.00 pack-year smoking history. He has never used smokeless tobacco. He reports current alcohol use of about 6.0 standard drinks of alcohol per week. He reports that he does not use drugs.  Allergies:  Allergies  Allergen Reactions  . Brilinta [Ticagrelor] Shortness Of Breath  . Penicillins Rash    Has patient had a PCN reaction causing immediate rash, facial/tongue/throat swelling, SOB or lightheadedness with hypotension: Yes Has patient had a PCN reaction causing severe rash involving mucus membranes or skin necrosis: No Has patient had a PCN reaction that required hospitalization: No Has patient had a PCN reaction occurring within the last 10 years: No If all of the above answers are "NO", then may proceed with Cephalosporin use.    Medications:  I have reviewed the patient's current medications. Prior to Admission:  Medications Prior to Admission  Medication Sig Dispense Refill Last Dose  .  amLODipine (NORVASC) 5 MG tablet Take 1 tablet (5 mg total) by mouth daily. 30 tablet 5 Past Month at Unknown time  . aspirin 81 MG tablet Take 1 tablet (81 mg total) by mouth daily. 30 tablet 2 07/04/2018 at 0400  . atorvastatin (LIPITOR) 80 MG tablet Take 1 tablet (80 mg total) by mouth daily at 6 PM. 30 tablet 5 Past Month at Unknown time  . metoprolol tartrate (LOPRESSOR) 25 MG tablet Take 25 mg by mouth 2 (two) times daily.   07/04/2018 at 0400  . nitroGLYCERIN (NITROSTAT) 0.4 MG SL tablet  Place 0.4 mg under the tongue as directed. Place 1 tablet (0.4mg  total) under the tongue every 5 minutes as needed for chest pain. May take 3 doses.   prn at prn  . omeprazole (PRILOSEC) 20 MG capsule Take 20 mg by mouth daily.   prn at prn  . ranolazine (RANEXA) 500 MG 12 hr tablet Take 500 mg by mouth 2 (two) times daily.   07/03/2018 at 2000  . ticagrelor (BRILINTA) 90 MG TABS tablet Take 1 tablet (90 mg total) by mouth every 12 (twelve) hours. 60 tablet 5 unknown at unknown   Scheduled: . amLODipine  5 mg Oral Daily  . aspirin EC  81 mg Oral Daily  . atorvastatin  80 mg Oral q1800  . enoxaparin (LOVENOX) injection  40 mg Subcutaneous Q24H  . famotidine  20 mg Oral BID  . metoprolol tartrate  25 mg Oral BID  . nitroGLYCERIN  0.4 mg Sublingual UD  . ranolazine  500 mg Oral BID  . ticagrelor  90 mg Oral Q12H    ROS: History obtained from the patient  General ROS: negative for - chills, fatigue, fever, night sweats, weight gain or weight loss Psychological ROS: negative for - behavioral disorder, hallucinations, memory difficulties, mood swings or suicidal ideation Ophthalmic ROS: blurry vision ENT ROS: dizziness Allergy and Immunology ROS: negative for - hives or itchy/watery eyes Hematological and Lymphatic ROS: negative for - bleeding problems, bruising or swollen lymph nodes Endocrine ROS: negative for - galactorrhea, hair pattern changes, polydipsia/polyuria or temperature intolerance Respiratory ROS: negative for - cough, hemoptysis, shortness of breath or wheezing Cardiovascular ROS: negative for - chest pain, dyspnea on exertion, edema or irregular heartbeat Gastrointestinal ROS: nausea/vomiting Genito-Urinary ROS: negative for - dysuria, hematuria, incontinence or urinary frequency/urgency Musculoskeletal ROS: negative for - joint swelling or muscular weakness Neurological ROS: as noted in HPI Dermatological ROS: negative for rash and skin lesion changes  Physical  Examination: Blood pressure (!) 160/97, pulse (!) 57, temperature 98.5 F (36.9 C), resp. rate 20, height 5\' 10"  (1.778 m), weight 68.3 kg, SpO2 97 %.  HEENT-  Normocephalic, no lesions, without obvious abnormality.  Normal external eye and conjunctiva.  Normal TM's bilaterally.  Normal auditory canals and external ears. Normal external nose, mucus membranes and septum.  Normal pharynx. Cardiovascular- S1, S2 normal, pulses palpable throughout   Lungs- chest clear, no wheezing, rales, normal symmetric air entry Abdomen- soft, non-tender; bowel sounds normal; no masses,  no organomegaly Extremities- no edema Lymph-no adenopathy palpable Musculoskeletal-no joint tenderness, deformity or swelling Skin-warm and dry, no hyperpigmentation, vitiligo, or suspicious lesions  Neurological Examination   Mental Status: Alert, oriented, thought content appropriate.  Speech fluent without evidence of aphasia.  Able to follow 3 step commands without difficulty. Cranial Nerves: II: Discs flat bilaterally; Visual fields grossly normal, pupils equal, round, reactive to light and accommodation III,IV, VI: right ptosis, extra-ocular motions intact bilaterally  with horizontal nystagmus noted V,VII: mild right facial droop, facial light touch sensation normal bilaterally VIII: hearing normal bilaterally IX,X: gag reflex present XI: bilateral shoulder shrug XII: midline tongue extension Motor: Right : Upper extremity   5/5    Left:     Upper extremity   5/5  Lower extremity   5/5     Lower extremity   5/5 Tone and bulk:normal tone throughout; no atrophy noted Sensory: Pinprick and light touch decreased on the left upper and lower extremities Deep Tendon Reflexes: 2+ and symmetric with absent AJ's bilaterally Plantars: Right: mute   Left: mute Cerebellar: Normal finger-to-nose and normal heel-to-shin testing bilaterally Gait: not tested due to safety concerns  Laboratory Studies:  Basic Metabolic  Panel: Recent Labs  Lab 07/02/18 1548 07/04/18 1309  NA 141 137  K 3.5 3.7  CL 111 108  CO2 25 23  GLUCOSE 104* 108*  BUN 8 14  CREATININE 0.93 0.89  CALCIUM 7.8* 8.9    Liver Function Tests: Recent Labs  Lab 07/02/18 1548 07/04/18 1309  AST 24 24  ALT 14 16  ALKPHOS 72 66  BILITOT 1.4* 0.8  PROT 6.8 7.5  ALBUMIN 3.9 3.8   Recent Labs  Lab 07/02/18 1548  LIPASE 24   No results for input(s): AMMONIA in the last 168 hours.  CBC: Recent Labs  Lab 07/02/18 1429 07/04/18 1309  WBC 8.6 7.6  NEUTROABS 6.8  --   HGB 16.8 15.4  HCT 48.7 44.6  MCV 95.9 96.1  PLT 268 245    Cardiac Enzymes: Recent Labs  Lab 07/02/18 1548 07/04/18 1309  TROPONINI <0.03 0.04*    BNP: Invalid input(s): POCBNP  CBG: Recent Labs  Lab 07/04/18 1256  GLUCAP 108*    Microbiology: Results for orders placed or performed during the hospital encounter of 01/26/18  MRSA PCR Screening     Status: None   Collection Time: 01/26/18  9:07 PM  Result Value Ref Range Status   MRSA by PCR NEGATIVE NEGATIVE Final    Comment:        The GeneXpert MRSA Assay (FDA approved for NASAL specimens only), is one component of a comprehensive MRSA colonization surveillance program. It is not intended to diagnose MRSA infection nor to guide or monitor treatment for MRSA infections. Performed at [facility], [address] [place], [place] [address]     Coagulation Studies: No results for input(s): LABPROT, INR in the last 72 hours.  Urinalysis: No results for input(s): COLORURINE, LABSPEC, PHURINE, GLUCOSEU, HGBUR, BILIRUBINUR, KETONESUR, PROTEINUR, UROBILINOGEN, NITRITE, LEUKOCYTESUR in the last 168 hours.  Invalid input(s): APPERANCEUR  Lipid Panel:    Component Value Date/Time   CHOL 159 01/27/2018 0601   CHOL 212 (H) 08/15/2014 0729   TRIG 97 01/27/2018 0601   TRIG 179 (H) 08/15/2014 0729   HDL 38 (L) 01/27/2018 0601   HDL 28 (L) 08/15/2014 0729   CHOLHDL 4.2  01/27/2018 0601   VLDL 19 01/27/2018 0601   VLDL 36 08/15/2014 0729   LDLCALC 102 (H) 01/27/2018 0601   LDLCALC 148 (H) 08/15/2014 0729    HgbA1C:  Lab Results  Component Value Date   HGBA1C 5.4 07/01/2016    Urine Drug Screen:      Component Value Date/Time   LABOPIA POSITIVE (A) 01/27/2018 1531   COCAINSCRNUR POSITIVE (A) 01/27/2018 1531   COCAINSCRNUR NONE DETECTED 07/01/2016 2039   LABBENZ NONE DETECTED 01/27/2018 1531   AMPHETMU NONE DETECTED 01/27/2018 1531  THCU NONE DETECTED 01/27/2018 1531   LABBARB NONE DETECTED 01/27/2018 1531    Alcohol Level: No results for input(s): ETH in the last 168 hours.  Other results: EKG: sinus bradycardia at 58 bpm.  Imaging: Ct Head Wo Contrast  Result Date: 07/04/2018 CLINICAL DATA:  53 year old male with weakness and ataxia for 2 days. EXAM: CT HEAD WITHOUT CONTRAST TECHNIQUE: Contiguous axial images were obtained from the base of the skull through the vertex without intravenous contrast. COMPARISON:  08/15/2014 and CT FINDINGS: Brain: Small to moderate RIGHT cerebellar infarct is noted without hemorrhage, new since 08/15/2014. No other infarct identified. No mass lesion, midline shift, hydrocephalus or extra-axial collection identified. Vascular: No hyperdense vessel or unexpected calcification. Skull: Normal. Negative for fracture or focal lesion. Sinuses/Orbits: No acute finding. Other: None. 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  No hemorrhage. Electronically Signed   By: Harmon Pier M.D.   On: 07/04/2018 13:42    Assessment: 53 y.o. male presenting with nausea, vomiting, gait instability and blurred vision.  Head CT reviewed and shows a subacute right cerebellar infarct.  No evidence of impending herniation.  Suspect small vessel etiology.  Patient with vascular risk factors but patient noncompliant with medications.  Further work up recommended.    Stroke Risk Factors - hyperlipidemia, hypertension and  smoking  Plan: 1. HgbA1c, fasting lipid panel 2. MRI of the brain without contrast 3. PT consult, OT consult, Speech consult 4. Echocardiogram 5. CTA of the head and neck 6. Prophylactic therapy-Continue ASA 7. NPO until RN stroke swallow screen 8. Telemetry monitoring 9. Frequent neuro checks 10. Smoking cessation counseling  Thana Farr, MD Neurology [phone] 07/04/2018, 4:02 PM

## 2018-07-04 NOTE — ED Triage Notes (Signed)
Pt pulled out of car. Reports unable to walk.  Legs dragging as family took pt from car to wheelchair.  Pt reports weakness since here Sunday. On brilenta.  Mild right facial droop. Diaphoretic. Able to move legs but cannot walk.  Feels lightheaded/off balance when standing.  C/o headache.

## 2018-07-05 ENCOUNTER — Inpatient Hospital Stay
Admit: 2018-07-05 | Discharge: 2018-07-05 | Disposition: A | Discharge status: Home / Self Care | Payer: Medicaid Other | Attending: Neurology | Admitting: Neurology

## 2018-07-05 LAB — URINE DRUG SCREEN, QUALITATIVE (ARMC ONLY)
Amphetamines, Ur Screen: NOT DETECTED
Barbiturates, Ur Screen: NOT DETECTED
Benzodiazepine, Ur Scrn: NOT DETECTED
Cannabinoid 50 Ng, Ur ~~LOC~~: NOT DETECTED
Cocaine Metabolite,Ur ~~LOC~~: POSITIVE — AB
MDMA (Ecstasy)Ur Screen: NOT DETECTED
Methadone Scn, Ur: NOT DETECTED
Opiate, Ur Screen: NOT DETECTED
Phencyclidine (PCP) Ur S: NOT DETECTED
Tricyclic, Ur Screen: NOT DETECTED

## 2018-07-05 LAB — LIPID PANEL
Cholesterol: 172 mg/dL (ref 0–200)
HDL: 34 mg/dL — ABNORMAL LOW (ref >40)
LDL Cholesterol: 109 mg/dL — ABNORMAL HIGH (ref 0–99)
Total CHOL/HDL Ratio: 5.1 RATIO
Triglycerides: 143 mg/dL (ref <150)
VLDL: 29 mg/dL (ref 0–40)

## 2018-07-05 LAB — URINALYSIS, COMPLETE (UACMP) WITH MICROSCOPIC
Bacteria, UA: NONE SEEN
Bilirubin Urine: NEGATIVE
Glucose, UA: NEGATIVE mg/dL
Hgb urine dipstick: NEGATIVE
Ketones, ur: NEGATIVE mg/dL
Leukocytes,Ua: NEGATIVE
Nitrite: NEGATIVE
Protein, ur: NEGATIVE mg/dL
Specific Gravity, Urine: >1.046 — ABNORMAL HIGH (ref 1.005–1.030)
pH: 6 (ref 5.0–8.0)

## 2018-07-05 LAB — TROPONIN I: Troponin I: 0.03 ng/mL (ref <0.03)

## 2018-07-05 LAB — HEMOGLOBIN A1C
Hgb A1c MFr Bld: 5.1 % (ref 4.8–5.6)
Mean Plasma Glucose: 99.67 mg/dL

## 2018-07-05 MED ORDER — CLOPIDOGREL BISULFATE 75 MG PO TABS
75.0000 mg | ORAL_TABLET | Freq: Every day | ORAL | Status: DC
  Administered 2018-07-05 – 2018-07-07 (×3): 75 mg via ORAL
  Filled 2018-07-05 (×3): qty 1

## 2018-07-05 NOTE — Progress Notes (Signed)
Rehab Admissions Coordinator Note:  Patient was screened by Stephania Fragmin for appropriateness for an Inpatient Acute Rehab Consult.  At this time, we are recommending Inpatient Rehab consult.  I have contacted MD for IP Rehab MD Consult order.  Please call with questions.    Stephania Fragmin 07/05/2018, 4:10 PM  I can be reached at [phone].

## 2018-07-05 NOTE — Progress Notes (Signed)
*  PRELIMINARY RESULTS* Echocardiogram 2D Echocardiogram has been performed.  Kenneth Morales 07/05/2018, 11:23 AM

## 2018-07-05 NOTE — Progress Notes (Signed)
Subjective: No new neurological complaints.    Objective: Current vital signs: BP (!) 150/91   Pulse 64   Temp 98.6 F (37 C) (Oral)   Resp 20   Ht 5\' 10"  (1.778 m)   Wt 68.3 kg   SpO2 98%   BMI 21.61 kg/m  Vital signs in last 24 hours: Temp:  [97.9 F (36.6 C)-98.7 F (37.1 C)] 98.6 F (37 C) (03/11 0758) Pulse Rate:  [57-64] 64 (03/11 0758) Resp:  [18-20] 20 (03/11 0758) BP: (139-160)/(86-98) 150/91 (03/11 0758) SpO2:  [95 %-99 %] 98 % (03/11 0758) Weight:  [68.3 kg] 68.3 kg (03/10 1543)  Intake/Output from previous day: 03/10 0701 - 03/11 0700 In: 720 [P.O.:720] Out: 725 [Urine:725] Intake/Output this shift: Total I/O In: 240 [P.O.:240] Out: -  Nutritional status:  Diet Order            Diet Heart Room service appropriate? Yes; Fluid consistency: Thin  Diet effective now              Neurologic Exam: Mental Status: Alert, oriented, thought content appropriate.  Speech fluent without evidence of aphasia.  Able to follow 3 step commands without difficulty. Cranial Nerves: II: Discs flat bilaterally; Visual fields grossly normal, pupils equal, round, reactive to light and accommodation III,IV, VI: right ptosis, extra-ocular motions intact bilaterally with horizontal nystagmus noted V,VII: mild right facial droop, facial light touch sensation normal bilaterally VIII: hearing normal bilaterally IX,X: gag reflex present XI: bilateral shoulder shrug XII: midline tongue extension Motor: Right :  Upper extremity   5/5                                      Left:     Upper extremity   5/5             Lower extremity   5/5                                                  Lower extremity   5/5 Tone and bulk:normal tone throughout; no atrophy noted Sensory: Pinprick and light touch decreased on the left upper and lower extremities  Lab Results: Basic Metabolic Panel: Recent Labs  Lab 07/02/18 1548 07/04/18 1309  NA 141 137  K 3.5 3.7  CL 111 108  CO2 25 23   GLUCOSE 104* 108*  BUN 8 14  CREATININE 0.93 0.89  CALCIUM 7.8* 8.9    Liver Function Tests: Recent Labs  Lab 07/02/18 1548 07/04/18 1309  AST 24 24  ALT 14 16  ALKPHOS 72 66  BILITOT 1.4* 0.8  PROT 6.8 7.5  ALBUMIN 3.9 3.8   Recent Labs  Lab 07/02/18 1548  LIPASE 24   No results for input(s): AMMONIA in the last 168 hours.  CBC: Recent Labs  Lab 07/02/18 1429 07/04/18 1309  WBC 8.6 7.6  NEUTROABS 6.8  --   HGB 16.8 15.4  HCT 48.7 44.6  MCV 95.9 96.1  PLT 268 245    Cardiac Enzymes: Recent Labs  Lab 07/02/18 1548 07/04/18 1309 07/04/18 1536 07/04/18 2047 07/05/18 0303  TROPONINI <0.03 0.04* 0.03* 0.03* 0.03*    Lipid Panel: Recent Labs  Lab 07/05/18 0303  CHOL 172  TRIG 143  HDL 34*  CHOLHDL  5.1  VLDL 29  LDLCALC 109*    CBG: Recent Labs  Lab 07/04/18 1256  GLUCAP 108*    Microbiology: Results for orders placed or performed during the hospital encounter of 01/26/18  MRSA PCR Screening     Status: None   Collection Time: 01/26/18  9:07 PM  Result Value Ref Range Status   MRSA by PCR NEGATIVE NEGATIVE Final    Comment:        The GeneXpert MRSA Assay (FDA approved for NASAL specimens only), is one component of a comprehensive MRSA colonization surveillance program. It is not intended to diagnose MRSA infection nor to guide or monitor treatment for MRSA infections. Performed at [facility], [address], [place], [place] [address]     Coagulation Studies: No results for input(s): LABPROT, INR in the last 72 hours.  Imaging: Ct Angio Head W Or Wo Contrast  Result Date: 07/04/2018 CLINICAL DATA:  Stroke follow-up. Falls and generalized weakness. EXAM: CT ANGIOGRAPHY HEAD AND NECK TECHNIQUE: Multidetector CT imaging of the head and neck was performed using the standard protocol during bolus administration of intravenous contrast. Multiplanar CT image reconstructions and MIPs were obtained to evaluate the vascular  anatomy. Carotid stenosis measurements (when applicable) are obtained utilizing NASCET criteria, using the distal internal carotid diameter as the denominator. CONTRAST:  78mL OMNIPAQUE IOHEXOL 350 MG/ML SOLN COMPARISON:  Head CT 07/04/2018 FINDINGS: CTA NECK FINDINGS SKELETON: There is no bony spinal canal stenosis. No lytic or blastic lesion. OTHER NECK: Normal pharynx, larynx and major salivary glands. No cervical lymphadenopathy. Unremarkable thyroid gland. UPPER CHEST: No pneumothorax or pleural effusion. No nodules or masses. AORTIC ARCH: There is no calcific atherosclerosis of the aortic arch. There is no aneurysm, dissection or hemodynamically significant stenosis of the visualized ascending aorta and aortic arch. Conventional 3 vessel aortic branching pattern. The visualized proximal subclavian arteries are widely patent. RIGHT CAROTID SYSTEM: --Common carotid artery: Widely patent origin without common carotid artery dissection or aneurysm. --Internal carotid artery: Normal without aneurysm, dissection or stenosis. --External carotid artery: No acute abnormality. LEFT CAROTID SYSTEM: --Common carotid artery: Widely patent origin without common carotid artery dissection or aneurysm. --Internal carotid artery: Normal without aneurysm, dissection or stenosis. --External carotid artery: No acute abnormality. VERTEBRAL ARTERIES: Right dominant configuration. Both origins are normal. No dissection, occlusion or flow-limiting stenosis to the vertebrobasilar confluence. CTA HEAD FINDINGS POSTERIOR CIRCULATION: --Vertebral arteries: Normal codominant configuration of V4 segments. --Posterior inferior cerebellar arteries (PICA): Patent origins from the vertebral arteries. --Anterior inferior cerebellar arteries (AICA): Not clearly visualized on the right. Normal left. --Basilar artery: Normal. --Superior cerebellar arteries: Normal. --Posterior cerebral arteries (PCA): Normal. Both originate from the basilar artery.  Posterior communicating arteries (p-comm) are diminutive or absent. ANTERIOR CIRCULATION: --Intracranial internal carotid arteries: Normal. --Anterior cerebral arteries (ACA): Normal. Both A1 segments are present. Patent anterior communicating artery (a-comm). --Middle cerebral arteries (MCA): Normal. VENOUS SINUSES: As permitted by contrast timing, patent. ANATOMIC VARIANTS: None DELAYED PHASE: No parenchymal contrast enhancement. Old right cerebellar infarct. Review of the MIP images confirms the above findings. IMPRESSION: No emergent large vessel occlusion or hemodynamically significant stenosis of the arteries of the head and neck. Electronically Signed   By: Deatra Robinson M.D.   On: 07/04/2018 18:44   Ct Head Wo Contrast  Result Date: 07/04/2018 CLINICAL DATA:  53 year old male with weakness and ataxia for 2 days. EXAM: CT HEAD WITHOUT CONTRAST TECHNIQUE: Contiguous axial images were obtained from the base of the skull through  the vertex without intravenous contrast. COMPARISON:  08/15/2014 and CT FINDINGS: Brain: Small to moderate RIGHT cerebellar infarct is noted without hemorrhage, new since 08/15/2014. No other infarct identified. No mass lesion, midline shift, hydrocephalus or extra-axial collection identified. Vascular: No hyperdense vessel or unexpected calcification. Skull: Normal. Negative for fracture or focal lesion. Sinuses/Orbits: No acute finding. Other: None. 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  No hemorrhage. Electronically Signed   By: Harmon Pier M.D.   On: 07/04/2018 13:42   Ct Angio Neck W Or Wo Contrast  Result Date: 07/04/2018 CLINICAL DATA:  Stroke follow-up. Falls and generalized weakness. EXAM: CT ANGIOGRAPHY HEAD AND NECK TECHNIQUE: Multidetector CT imaging of the head and neck was performed using the standard protocol during bolus administration of intravenous contrast. Multiplanar CT image reconstructions and MIPs were obtained to evaluate the vascular  anatomy. Carotid stenosis measurements (when applicable) are obtained utilizing NASCET criteria, using the distal internal carotid diameter as the denominator. CONTRAST:  75mL OMNIPAQUE IOHEXOL 350 MG/ML SOLN COMPARISON:  Head CT 07/04/2018 FINDINGS: CTA NECK FINDINGS SKELETON: There is no bony spinal canal stenosis. No lytic or blastic lesion. OTHER NECK: Normal pharynx, larynx and major salivary glands. No cervical lymphadenopathy. Unremarkable thyroid gland. UPPER CHEST: No pneumothorax or pleural effusion. No nodules or masses. AORTIC ARCH: There is no calcific atherosclerosis of the aortic arch. There is no aneurysm, dissection or hemodynamically significant stenosis of the visualized ascending aorta and aortic arch. Conventional 3 vessel aortic branching pattern. The visualized proximal subclavian arteries are widely patent. RIGHT CAROTID SYSTEM: --Common carotid artery: Widely patent origin without common carotid artery dissection or aneurysm. --Internal carotid artery: Normal without aneurysm, dissection or stenosis. --External carotid artery: No acute abnormality. LEFT CAROTID SYSTEM: --Common carotid artery: Widely patent origin without common carotid artery dissection or aneurysm. --Internal carotid artery: Normal without aneurysm, dissection or stenosis. --External carotid artery: No acute abnormality. VERTEBRAL ARTERIES: Right dominant configuration. Both origins are normal. No dissection, occlusion or flow-limiting stenosis to the vertebrobasilar confluence. CTA HEAD FINDINGS POSTERIOR CIRCULATION: --Vertebral arteries: Normal codominant configuration of V4 segments. --Posterior inferior cerebellar arteries (PICA): Patent origins from the vertebral arteries. --Anterior inferior cerebellar arteries (AICA): Not clearly visualized on the right. Normal left. --Basilar artery: Normal. --Superior cerebellar arteries: Normal. --Posterior cerebral arteries (PCA): Normal. Both originate from the basilar artery.  Posterior communicating arteries (p-comm) are diminutive or absent. ANTERIOR CIRCULATION: --Intracranial internal carotid arteries: Normal. --Anterior cerebral arteries (ACA): Normal. Both A1 segments are present. Patent anterior communicating artery (a-comm). --Middle cerebral arteries (MCA): Normal. VENOUS SINUSES: As permitted by contrast timing, patent. ANATOMIC VARIANTS: None DELAYED PHASE: No parenchymal contrast enhancement. Old right cerebellar infarct. Review of the MIP images confirms the above findings. IMPRESSION: No emergent large vessel occlusion or hemodynamically significant stenosis of the arteries of the head and neck. Electronically Signed   By: Deatra Robinson M.D.   On: 07/04/2018 18:44   Mr Brain Wo Contrast  Result Date: 07/04/2018 CLINICAL DATA:  Stroke follow-up. Nausea, vomiting and gait instability. EXAM: MRI HEAD WITHOUT CONTRAST TECHNIQUE: Multiplanar, multiecho pulse sequences of the brain and surrounding structures were obtained without intravenous contrast. COMPARISON:  CTA head neck 07/04/2018 FINDINGS: BRAIN: There is multifocal ischemia within the right cerebellar hemisphere, within the PICA territory. No other area of abnormal diffusion restriction. The midline structures are normal. No midline shift or other mass effect. Mild edema in the right cerebellum. Multifocal periventricular white matter hyperintensity, most often a result of chronic microvascular ischemia. The cerebral and  cerebellar volume are age-appropriate. There is magnetic susceptibility effect along the course of the right PICA, likely indicating the presence of thrombus. VASCULAR: Major intracranial arterial and venous sinus flow voids are normal. SKULL AND UPPER CERVICAL SPINE: Calvarial bone marrow signal is normal. There is no skull base mass. Visualized upper cervical spine and soft tissues are normal. SINUSES/ORBITS: No fluid levels or advanced mucosal thickening. No mastoid or middle ear effusion. The  orbits are normal. IMPRESSION: 1. 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 No mass effect or acute hemorrhage. 2. Magnetic susceptibility effect along the course of the right PICA likely indicates the presence of thrombus. This corresponds to areas of non opacification on the CTA of the head and neck, though the PICA origin is patent. Electronically Signed   By: Deatra Robinson M.D.   On: 07/04/2018 21:49    Medications:  I have reviewed the patient's current medications. Scheduled: . amLODipine  5 mg Oral Daily  . aspirin EC  81 mg Oral Daily  . atorvastatin  80 mg Oral q1800  . enoxaparin (LOVENOX) injection  40 mg Subcutaneous Q24H  . famotidine  20 mg Oral BID  . metoprolol tartrate  25 mg Oral BID  . nitroGLYCERIN  0.4 mg Sublingual UD  . ranolazine  500 mg Oral BID  . ticagrelor  90 mg Oral Q12H    Assessment/Plan: No new neurological complaints.  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  Patient reports taking ASA at home but Brilinta caused him respiratory problems over time and had stopped its use.  Was having side effects from some of his antihypertensives as well   UDS positive for cocaine. Echocardiogram pending.  A1c 5.2, LDL 109.  Recommendations: 1.  Drug counseling 2.  Aggressive lipid management with target LDL<70. 3.  Would change ASA and Brilinta to ASA and Plavix  daily 4.  Echocardiogram pending 5.  Continue therapy    LOS: 1 day   Thana Farr, MD Neurology [phone] 07/05/2018  3:13 PM

## 2018-07-05 NOTE — Evaluation (Signed)
Physical Therapy Evaluation Patient Details Name: Kenneth Morales MRN: [id number] DOB: [birth year] Today's Date: 07/05/2018    History of Present Illness  presented to ER secondary to persistent nausea/vomiting, dizziness and gait instability (with multiple falls); admitted for TIA/CVA work up.  MRI significant for multifocal R cerebellar infarcts (no evidence of impending herniation)  Clinical Impression  Patient endorses headache upon arrival to room, but agreeable to session.  Repeatedly voicing motivation to improve/recover, "I'm a single parent".  Oriented to all information and follows commands throughout session.  Does endorse visual difficulties--exam significant for resting nystagmus and vertical diplopia with mildly dysconjugate gaze/tracking at times.Mild/mod coordination deficits in UE < LE (R) with significant weakness/ataxia in trunk/core musculature.  Currently completes bed mobility with mod indep, but requires close sup for unsupported sitting balance (lists to R with fatigue, divided attention); mod assist +1-2 for sit/stand, standing balance and very short-distance gait efforts.  Heavy R lateral lean/pushing behaviors with standing position; significant difficulty with gait sequencing, coordination and R placement/control.  Mother present in room and supportive/encouraging to patient throughout session. Would benefit from skilled PT to address above deficits and promote optimal return to PLOF; recommend transition to acute inpatient rehab upon discharge for high-intensity, post-acute rehab services.      Follow Up Recommendations CIR    Equipment Recommendations       Recommendations for Other Services       Precautions / Restrictions Precautions Precautions: Fall Restrictions Weight Bearing Restrictions: No      Mobility  Bed Mobility Overal bed mobility: Modified Independent             General bed mobility comments: Pt able to complete sup<>sit transfer  with HOB lowered independently with increased time and effort.  Transfers Overall transfer level: Needs assistance Equipment used: 2 person hand held assist Transfers: Sit to/from Stand Sit to Stand: Mod assist;+2 physical assistance         General transfer comment: heavy R lateral lean/pushing with standing efforts.  Partially corrects with cuing and environmental supports, but unable to sustain without constant +2  Ambulation/Gait Ambulation/Gait assistance: Mod assist Gait Distance (Feet): (4' forward/backward x4 repetitions) Assistive device: 2 person hand held assist       General Gait Details: heavy R lateral lean/pushing with all standing postures; constant manual assist to inhibit L elbow extension with clsoed chain positino. Difficulty with R LE placement; step by step cuing for limb advancement, R TKE and R foot placement at initial contact/loading.  Poor awareness of balance deficits;limited ability to self-correct without cuing from therapist.  Stairs            Wheelchair Mobility    Modified Rankin (Stroke Patients Only)       Balance Overall balance assessment: Needs assistance Sitting-balance support: No upper extremity supported;Feet supported Sitting balance-Leahy Scale: Fair Sitting balance - Comments: Lists to R with divided attention or fatigue, requires therapist cuing/assist for correction Postural control: Right lateral lean Standing balance support: Bilateral upper extremity supported Standing balance-Leahy Scale: Zero Standing balance comment: heavy mod assist +1-2 for standing balance, midline orientation                             Pertinent Vitals/Pain Pain Assessment: 0-10 Pain Score: 4  Pain Location: Headache Pain Descriptors / Indicators: Aching;Grimacing Pain Intervention(s): Limited activity within patient's tolerance;Monitored during session;Repositioned;Patient requesting pain meds-RN notified    Home Living  Family/patient expects to  be discharged to:: Private residence Living Arrangements: Children(single-parent of 55 year old daughter) Available Help at Discharge: Family;Available PRN/intermittently Type of Home: Apartment Home Access: Level entry     Home Layout: Two level Home Equipment: None      Prior Function Level of Independence: Independent         Comments: Indep with ADLs, household and community distances without assist device; + driving; single-parent to 58 year old daughter     Hand Dominance   Dominant Hand: Right    Extremity/Trunk Assessment   Upper Extremity Assessment Upper Extremity Assessment: (grossly at least 4/5 throughout bilat UEs; mild ataxia/dysmetria with finger to nose bilat) RUE Deficits / Details: Pt grossly 4/5 BUE. Good FMC with opposition. 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 RUE Coordination: decreased gross motor LUE Deficits / Details: Pt endorses same impairments as RUE with additional paresthesia through his left arm.  LUE Sensation: decreased light touch LUE Coordination: decreased gross motor    Lower Extremity Assessment Lower Extremity Assessment: (grossly at least 4/5 throughout; denies sensory deficit; significant difficulty with motor control/coordination of R LE)       Communication   Communication: No difficulties  Cognition Arousal/Alertness: Awake/alert Behavior During Therapy: WFL for tasks assessed/performed Overall Cognitive Status: Within Functional Limits for tasks assessed                                 General Comments: limited insight into severity of deficits      General Comments      Exercises Other Exercises Other Exercises: Visual/vestibular screening significant for resting nystagmus bilat; gaze (fixed and non-fixed) sightly dysconjugate; vertical diplopia noted. Other Exercises:  Multiple gait trials forward/backward with bila tHHA, mod assist +2-emphasis on sequencing and initiation, midline orienation and balance correction. Shoe placed on R foot for passive weight shift to L and lateral flexion/trunk closure of R trunk musculature; fair response to internvetion, but requires constant cuing for R TKE in closed-chain positioning   Assessment/Plan    PT Assessment Patient needs continued PT services  PT Problem List Decreased activity tolerance;Decreased mobility;Decreased balance;Decreased coordination;Decreased knowledge of use of DME;Decreased safety awareness;Decreased knowledge of precautions       PT Treatment Interventions DME instruction;Gait training;Stair training;Functional mobility training;Therapeutic activities;Therapeutic exercise;Balance training;Neuromuscular re-education;Cognitive remediation;Patient/family education    PT Goals (Current goals can be found in the Care Plan section)  Acute Rehab PT Goals Patient Stated Goal: To regain strength and feel safe. PT Goal Formulation: With patient Time For Goal Achievement: 07/19/18 Potential to Achieve Goals: Good    Frequency 7X/week   Barriers to discharge        Co-evaluation               AM-PAC PT "6 Clicks" Mobility  Outcome Measure Help needed turning from your back to your side while in a flat bed without using bedrails?: None Help needed moving from lying on your back to sitting on the side of a flat bed without using bedrails?: A Little Help needed moving to and from a bed to a chair (including a wheelchair)?: A Lot Help needed standing up from a chair using your arms (e.g., wheelchair or bedside chair)?: A Lot Help needed to walk in hospital room?: A Lot Help needed climbing 3-5 steps with a railing? : Total  6 Click Score: 14    End of Session Equipment Utilized During Treatment: Gait belt Activity Tolerance: Patient tolerated treatment well Patient left: in bed;with call  bell/phone within reach;with bed alarm set;with family/visitor present Nurse Communication: Mobility status PT Visit Diagnosis: Hemiplegia and hemiparesis;Unsteadiness on feet (R26.81) Hemiplegia - Right/Left: Right Hemiplegia - dominant/non-dominant: Dominant Hemiplegia - caused by: Cerebral infarction    Time: 1345-1430 PT Time Calculation (min) (ACUTE ONLY): 45 min   Charges:   PT Evaluation $PT Eval Moderate Complexity: 1 Mod PT Treatments $Gait Training: 8-22 mins $Neuromuscular Re-education: 8-22 mins        Erykah Lippert H. Manson Passey, PT, DPT, NCS 07/05/18, 6:01 PM [phone]

## 2018-07-05 NOTE — Progress Notes (Signed)
SLP Note  Patient Details Name: Kenneth Morales MRN: [id number] DOB: 09-Sep-1965   Cancelled treatment:       Reason Eval/Treat Not Completed: SLP screened, no needs identified, will sign off. Chart reviewed; NSG consulted. Upon entering room, pt was conversing w/ OT and SLP - speech was intelligible and pt clearly and appropriately communicated all wants and needs. Additionally, pt communicated all wants/needs w/ SLP. Pt and mother did not identify any new deficits - Mother and pt reported pt tends to talk quickly at baseline. SLP educated pt re: slowing down and articulating while talking if noticing any difficulty/changes w/ speech clarity.  When asked about swallowing function, pt reported that he has been swallowing w/o any immediate, overt s/s of aspiration noted (thorat clearing, coughing, choking). While in room, pt quickly drank a chocolate Ensure (~8oz) in continuous gulps w/ no immediate overt s/s of aspiration noted by SLP.  No further Skilled ST services required at this time, ST to sign/off - NSG to re-consult ST if any changes in status noted while admitted.    Emogene Morgan, Graduate Student SLP 07/05/2018, 1:42 PM

## 2018-07-05 NOTE — Evaluation (Signed)
Occupational Therapy Evaluation Patient Details Name: Kenneth Morales MRN: [id number] DOB: 03-17-1966 Today's Date: 07/05/2018    History of Present Illness Kenneth Morales is an 53 y.o. male who presented to the ED on 07/02/18 with nausea, vomiting and gait instability.  Was seen in the ED and felt to have a viral illness.  Patient was sent home but but returned to ED on 07/04/18 because symptoms did not improve and he had begun to have blurry vision. PMH includes: Acute right sided cerebellar infarcts, CAD, HTN, prior MI, & substance use.    Clinical Impression   Pt seen for OT evaluation this date. Pt lives in a 2 story apartment home with level entry with handrails on left for stairs to second floor. Pt endorses he would not be able to live on the main floor of his home since all bedrooms and bathrooms are upstairs. Prior to hospital admission pt was independent in all ADLs/IADLs and caring for his 68 year old daughter who lives with him in his home. Currently pt demonstrates impairments in balance, activity tolerance, vision (has significant diploplia), sensation loss in both hands and throughout the left side of his body. He requires +2 mod assist for standing ADLs, functional mobility, and transfers. Attempted STS transfer at time of evaluation. Pt was unable to maintain his balance or weight shift without support. Pt able to come EOB with cga for safety. Had difficulty maintaining seated balance with right lateral lean noted on this date.  Pt would benefit from skilled OT to address noted impairments and functional limitations (see below for any additional details) in order to maximize safety and independence while minimizing falls risk and caregiver burden.  Upon hospital discharge, recommend CIR to maximize safety and return to PLOF.     Follow Up Recommendations  CIR    Equipment Recommendations  (TBD)    Recommendations for Other Services Rehab consult     Precautions /  Restrictions Precautions Precautions: Fall Restrictions Weight Bearing Restrictions: No      Mobility Bed Mobility Overal bed mobility: Modified Independent             General bed mobility comments: Pt able to complete sup<>sit transfer with HOB lowered independently with increased time and effort.  Transfers Overall transfer level: Needs assistance Equipment used: Rolling walker (2 wheeled) Transfers: Sit to/from Stand Sit to Stand: Min assist         General transfer comment: Pt req min assist for sts. Unsteady on feet, unable to weight shift in standing. Unable to ambulate at time of evaluation    Balance Overall balance assessment: Needs assistance Sitting-balance support: Single extremity supported Sitting balance-Leahy Scale: Fair   Postural control: Right lateral lean Standing balance support: Bilateral upper extremity supported Standing balance-Leahy Scale: Poor Standing balance comment: Pt unable to weight shift. Tended to lift right leg out straight at time of evaluation.                            ADL either performed or assessed with clinical judgement   ADL Overall ADL's : Needs assistance/impaired Eating/Feeding: Set up;Minimal assistance;Sitting Eating/Feeding Details (indicate cue type and reason): Pt indicates he has had difficulty loading his spoon unsure if d/t double vision or sensation loss in hands.  Grooming: Set up;Sitting;Oral care;Cueing for sequencing Grooming Details (indicate cue type and reason): Pt with difficulty applying toothpaste to toothbrush accurately. Tended to hold toothpaste about 1-2" above  toothbrush was able to self-correct with increased time. Required set up and assist for holding basin when rinsing mouth.  Upper Body Bathing: Set up;Sitting;Cueing for sequencing;Minimal assistance;Cueing for safety   Lower Body Bathing: Set up;Moderate assistance;Sit to/from stand;With adaptive equipment;Cueing for  safety;Cueing for sequencing;+2 for safety/equipment   Upper Body Dressing : Set up;Minimal assistance;Sitting;Cueing for sequencing   Lower Body Dressing: Set up;Moderate assistance;+2 for safety/equipment;With adaptive equipment;Sit to/from stand   Toilet Transfer: Set up;BSC;Stand-pivot;+2 for safety/equipment;Cueing for sequencing;RW   Toileting- Clothing Manipulation and Hygiene: Minimal assistance;Set up;Sit to/from stand;+2 for safety/equipment;With adaptive equipment       Functional mobility during ADLs: Moderate assistance;+2 for physical assistance;+2 for safety/equipment General ADL Comments: Pt very unstable on feet and loses balance easily. Requies +2 for safety/assistance with functional mobility and sts transfers.      Vision Baseline Vision/History: No visual deficits Patient Visual Report: Diplopia;Blurring of vision Vision Assessment?: Yes Eye Alignment: Within Functional Limits Ocular Range of Motion: Within Functional Limits;Other (comment)(Some nystagmus noted in left eye when tracking across visual fields and with convergence. ) Alignment/Gaze Preference: Gaze right(Gaze tended toward right. ) Tracking/Visual Pursuits: Decreased smoothness of vertical tracking;Decreased smoothness of horizontal tracking;Requires cues, head turns, or add eye shifts to track Convergence: Impaired - to be further tested in functional context Diplopia Assessment: Objects split on top of one another;Present all the time/all directions Additional Comments: Pt endorsing significant diploplia and headache at time of evaluation.     Perception     Praxis      Pertinent Vitals/Pain Pain Assessment: 0-10 Pain Score: 4  Pain Location: Headache Pain Descriptors / Indicators: Aching;Constant Pain Intervention(s): Limited activity within patient's tolerance;Monitored during session;Patient requesting pain meds-RN notified     Hand Dominance Right   Extremity/Trunk Assessment Upper  Extremity Assessment Upper Extremity Assessment: Overall WFL for tasks assessed;RUE deficits/detail;LUE deficits/detail RUE Deficits / Details: Pt grossly 4/5 BUE. Good FMC with opposition. 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 RUE Coordination: decreased gross motor LUE Deficits / Details: Pt endorses same impairments as RUE with additional paresthesia through his left arm.  LUE Sensation: decreased light touch LUE Coordination: decreased gross motor   Lower Extremity Assessment Lower Extremity Assessment: Defer to PT evaluation;Generalized weakness       Communication Communication Communication: No difficulties   Cognition Arousal/Alertness: Awake/alert Behavior During Therapy: WFL for tasks assessed/performed Overall Cognitive Status: Within Functional Limits for tasks assessed                                     General Comments       Exercises Other Exercises Other Exercises: Pt educated on falls prevention strategies, safe use of AE for LB dressing, and safe positioning strategies for mgmt of L sided sensation loss in order to maximize safety and skin integrity.  Other Exercises: Pt assisted with grooming ADL activity EOB. Educated safe positioning for functional transfers.    Shoulder Instructions      Home Living Family/patient expects to be discharged to:: Private residence Living Arrangements: Children(15 year old daughter) Available Help at Discharge: Family;Available PRN/intermittently Type of Home: Apartment Home Access: Level entry     Home Layout: Two level Alternate Level Stairs-Number of Steps: Pt could not live on main level. All bedrooms and bathrooms are on second level.  Alternate Level Stairs-Rails: Left Bathroom Shower/Tub: Tub/shower unit;Curtain   Bathroom Toilet: Standard     Home Equipment: None          Prior  Functioning/Environment Level of Independence: Independent        Comments: Pt endorses he was completing all ADLs and IADLs independently, driving, community ambulator.         OT Problem List: Decreased strength;Impaired balance (sitting and/or standing);Decreased cognition;Decreased range of motion;Impaired vision/perception;Decreased activity tolerance;Decreased knowledge of use of DME or AE;Impaired sensation;Impaired UE functional use;Decreased safety awareness      OT Treatment/Interventions: Self-care/ADL training;Therapeutic exercise;Neuromuscular education;Balance training;Energy conservation;Therapeutic activities;DME and/or AE instruction;Cognitive remediation/compensation    OT Goals(Current goals can be found in the care plan section) Acute Rehab OT Goals Patient Stated Goal: To regain strength and feel safe. OT Goal Formulation: With patient Time For Goal Achievement: 07/19/18 Potential to Achieve Goals: Good ADL Goals Pt Will Perform Eating: with modified independence;with adaptive utensils;sitting(With LRAD PRN for safety and improved functional independence.) Pt Will Perform Grooming: with modified independence;with adaptive equipment(With LRAD PRN for safety and improved functional independence.) Pt Will Perform Upper Body Dressing: with modified independence;sitting;with adaptive equipment(With LRAD PRN for safety and improved functional independence.) Pt Will Perform Lower Body Dressing: with min assist;sit to/from stand;with adaptive equipment(With LRAD for safety and improved functional independence.) Pt Will Transfer to Toilet: with min assist;bedside commode;ambulating(With LRAD/DME PRN for safety and improved functional independence.)  OT Frequency: Min 3X/week   Barriers to D/C: Inaccessible home environment;Decreased caregiver support          Co-evaluation              AM-PAC OT "6 Clicks" Daily Activity     Outcome Measure Help from another  person eating meals?: A Little Help from another person taking care of personal grooming?: A Little Help from another person toileting, which includes using toliet, bedpan, or urinal?: A Lot Help from another person bathing (including washing, rinsing, drying)?: A Lot Help from another person to put on and taking off regular upper body clothing?: A Little Help from another person to put on and taking off regular lower body clothing?: A Lot 6 Click Score: 15   End of Session Equipment Utilized During Treatment: Gait belt;Rolling walker Nurse Communication: Patient requests pain meds  Activity Tolerance: Patient tolerated treatment well Patient left: in bed;with call bell/phone within reach;with bed alarm set;with family/visitor present  OT Visit Diagnosis: Unsteadiness on feet (R26.81);Other abnormalities of gait and mobility (R26.89);History of falling (Z91.81);Hemiplegia and hemiparesis Hemiplegia - Right/Left: Right Hemiplegia - dominant/non-dominant: Dominant                Time: 1300-1336 OT Time Calculation (min): 36 min Charges:  OT General Charges $OT Visit: 1 Visit OT Evaluation $OT Eval Low Complexity: 1 Low OT Treatments $Self Care/Home Management : 23-37 mins  Rockney Ghee, M.S., OTR/L Ascom: [phone] 07/05/18, 4:00 PM

## 2018-07-05 NOTE — Progress Notes (Signed)
[employer] [facility] - [place] [facility]   PATIENT NAME: Kenneth Morales    MR#:  [id number]  DATE OF BIRTH:  11-14-1965  SUBJECTIVE:  CHIEF COMPLAINT:   Chief Complaint  Patient presents with   Weakness   No new complaint this morning.  Moving all extremities with no deficit.  Still complains of feeling dizzy.  Diagnosed with acute right cerebellar infarct.  No chest pain.  No shortness of breath  REVIEW OF SYSTEMS:  Review of Systems  Constitutional: Negative for chills, fever and weight loss.  HENT: Negative for hearing loss and tinnitus.   Eyes: Positive for blurred vision. Negative for photophobia and pain.  Respiratory: Negative for cough and hemoptysis.   Cardiovascular: Negative for chest pain and palpitations.  Gastrointestinal: Negative for abdominal pain, heartburn, nausea and vomiting.  Genitourinary: Negative for dysuria and urgency.  Musculoskeletal: Negative for myalgias.  Skin: Negative for itching and rash.  Neurological: Positive for dizziness. Negative for tingling and focal weakness.  Psychiatric/Behavioral: Positive for substance abuse. Negative for depression.    DRUG ALLERGIES:   Allergies  Allergen Reactions   Brilinta [Ticagrelor] Shortness Of Breath   Penicillins Rash    Has patient had a PCN reaction causing immediate rash, facial/tongue/throat swelling, SOB or lightheadedness with hypotension: Yes Has patient had a PCN reaction causing severe rash involving mucus membranes or skin necrosis: No Has patient had a PCN reaction that required hospitalization: No Has patient had a PCN reaction occurring within the last 10 years: No If all of the above answers are "NO", then may proceed with Cephalosporin use.   VITALS:  Blood pressure (!) 150/91, pulse 64, temperature 98.6 F (37 C), temperature source Oral, resp. rate 20, height 5\' 10"  (1.778 m), weight 68.3 kg, SpO2 98 %. PHYSICAL EXAMINATION:   Physical Exam  Constitutional: He  is oriented to person, place, and time. He appears well-developed and well-nourished.  HENT:  Head: Normocephalic and atraumatic.  Right Ear: External ear normal.  Mouth/Throat: Oropharynx is clear and moist.  Eyes: Pupils are equal, round, and reactive to light. Conjunctivae and EOM are normal. Right eye exhibits no discharge.  Neck: Normal range of motion. Neck supple. No tracheal deviation present.  Cardiovascular: Normal rate, regular rhythm and normal heart sounds.  Respiratory: Effort normal and breath sounds normal. No respiratory distress.  GI: Soft. Bowel sounds are normal. He exhibits no distension.  Musculoskeletal: Normal range of motion.        General: No edema.  Neurological: He is alert and oriented to person, place, and time. No cranial nerve deficit.  Gait not checked  Skin: Skin is warm. He is not diaphoretic. No erythema.  Psychiatric: He has a normal mood and affect. His behavior is normal.   LABORATORY PANEL:  Male CBC Recent Labs  Lab 07/04/18 1309  WBC 7.6  HGB 15.4  HCT 44.6  PLT 245   ------------------------------------------------------------------------------------------------------------------ Chemistries  Recent Labs  Lab 07/04/18 1309  NA 137  K 3.7  CL 108  CO2 23  GLUCOSE 108*  BUN 14  CREATININE 0.89  CALCIUM 8.9  AST 24  ALT 16  ALKPHOS 66  BILITOT 0.8   RADIOLOGY:  Ct Angio Head W Or Wo Contrast  Result Date: 07/04/2018 CLINICAL DATA:  Stroke follow-up. Falls and generalized weakness. EXAM: CT ANGIOGRAPHY HEAD AND NECK TECHNIQUE: Multidetector CT imaging of the head and neck was performed using the standard protocol during bolus administration of intravenous contrast. Multiplanar CT image  reconstructions and MIPs were obtained to evaluate the vascular anatomy. Carotid stenosis measurements (when applicable) are obtained utilizing NASCET criteria, using the distal internal carotid diameter as the denominator. CONTRAST:  75mL  OMNIPAQUE IOHEXOL 350 MG/ML SOLN COMPARISON:  Head CT 07/04/2018 FINDINGS: CTA NECK FINDINGS SKELETON: There is no bony spinal canal stenosis. No lytic or blastic lesion. OTHER NECK: Normal pharynx, larynx and major salivary glands. No cervical lymphadenopathy. Unremarkable thyroid gland. UPPER CHEST: No pneumothorax or pleural effusion. No nodules or masses. AORTIC ARCH: There is no calcific atherosclerosis of the aortic arch. There is no aneurysm, dissection or hemodynamically significant stenosis of the visualized ascending aorta and aortic arch. Conventional 3 vessel aortic branching pattern. The visualized proximal subclavian arteries are widely patent. RIGHT CAROTID SYSTEM: --Common carotid artery: Widely patent origin without common carotid artery dissection or aneurysm. --Internal carotid artery: Normal without aneurysm, dissection or stenosis. --External carotid artery: No acute abnormality. LEFT CAROTID SYSTEM: --Common carotid artery: Widely patent origin without common carotid artery dissection or aneurysm. --Internal carotid artery: Normal without aneurysm, dissection or stenosis. --External carotid artery: No acute abnormality. VERTEBRAL ARTERIES: Right dominant configuration. Both origins are normal. No dissection, occlusion or flow-limiting stenosis to the vertebrobasilar confluence. CTA HEAD FINDINGS POSTERIOR CIRCULATION: --Vertebral arteries: Normal codominant configuration of V4 segments. --Posterior inferior cerebellar arteries (PICA): Patent origins from the vertebral arteries. --Anterior inferior cerebellar arteries (AICA): Not clearly visualized on the right. Normal left. --Basilar artery: Normal. --Superior cerebellar arteries: Normal. --Posterior cerebral arteries (PCA): Normal. Both originate from the basilar artery. Posterior communicating arteries (p-comm) are diminutive or absent. ANTERIOR CIRCULATION: --Intracranial internal carotid arteries: Normal. --Anterior cerebral arteries (ACA):  Normal. Both A1 segments are present. Patent anterior communicating artery (a-comm). --Middle cerebral arteries (MCA): Normal. VENOUS SINUSES: As permitted by contrast timing, patent. ANATOMIC VARIANTS: None DELAYED PHASE: No parenchymal contrast enhancement. Old right cerebellar infarct. Review of the MIP images confirms the above findings. IMPRESSION: No emergent large vessel occlusion or hemodynamically significant stenosis of the arteries of the head and neck. Electronically Signed   By: Deatra Robinson M.D.   On: 07/04/2018 18:44   Ct Head Wo Contrast  Result Date: 07/04/2018 CLINICAL DATA:  53 year old male with weakness and ataxia for 2 days. EXAM: CT HEAD WITHOUT CONTRAST TECHNIQUE: Contiguous axial images were obtained from the base of the skull through the vertex without intravenous contrast. COMPARISON:  08/15/2014 and CT FINDINGS: Brain: Small to moderate RIGHT cerebellar infarct is noted without hemorrhage, new since 08/15/2014. No other infarct identified. No mass lesion, midline shift, hydrocephalus or extra-axial collection identified. Vascular: No hyperdense vessel or unexpected calcification. Skull: Normal. Negative for fracture or focal lesion. Sinuses/Orbits: No acute finding. Other: None. 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  No hemorrhage. Electronically Signed   By: Harmon Pier M.D.   On: 07/04/2018 13:42   Ct Angio Neck W Or Wo Contrast  Result Date: 07/04/2018 CLINICAL DATA:  Stroke follow-up. Falls and generalized weakness. EXAM: CT ANGIOGRAPHY HEAD AND NECK TECHNIQUE: Multidetector CT imaging of the head and neck was performed using the standard protocol during bolus administration of intravenous contrast. Multiplanar CT image reconstructions and MIPs were obtained to evaluate the vascular anatomy. Carotid stenosis measurements (when applicable) are obtained utilizing NASCET criteria, using the distal internal carotid diameter as the denominator. CONTRAST:  75mL  OMNIPAQUE IOHEXOL 350 MG/ML SOLN COMPARISON:  Head CT 07/04/2018 FINDINGS: CTA NECK FINDINGS SKELETON: There is no bony spinal canal stenosis. No lytic or blastic lesion. OTHER NECK: Normal  pharynx, larynx and major salivary glands. No cervical lymphadenopathy. Unremarkable thyroid gland. UPPER CHEST: No pneumothorax or pleural effusion. No nodules or masses. AORTIC ARCH: There is no calcific atherosclerosis of the aortic arch. There is no aneurysm, dissection or hemodynamically significant stenosis of the visualized ascending aorta and aortic arch. Conventional 3 vessel aortic branching pattern. The visualized proximal subclavian arteries are widely patent. RIGHT CAROTID SYSTEM: --Common carotid artery: Widely patent origin without common carotid artery dissection or aneurysm. --Internal carotid artery: Normal without aneurysm, dissection or stenosis. --External carotid artery: No acute abnormality. LEFT CAROTID SYSTEM: --Common carotid artery: Widely patent origin without common carotid artery dissection or aneurysm. --Internal carotid artery: Normal without aneurysm, dissection or stenosis. --External carotid artery: No acute abnormality. VERTEBRAL ARTERIES: Right dominant configuration. Both origins are normal. No dissection, occlusion or flow-limiting stenosis to the vertebrobasilar confluence. CTA HEAD FINDINGS POSTERIOR CIRCULATION: --Vertebral arteries: Normal codominant configuration of V4 segments. --Posterior inferior cerebellar arteries (PICA): Patent origins from the vertebral arteries. --Anterior inferior cerebellar arteries (AICA): Not clearly visualized on the right. Normal left. --Basilar artery: Normal. --Superior cerebellar arteries: Normal. --Posterior cerebral arteries (PCA): Normal. Both originate from the basilar artery. Posterior communicating arteries (p-comm) are diminutive or absent. ANTERIOR CIRCULATION: --Intracranial internal carotid arteries: Normal. --Anterior cerebral arteries (ACA):  Normal. Both A1 segments are present. Patent anterior communicating artery (a-comm). --Middle cerebral arteries (MCA): Normal. VENOUS SINUSES: As permitted by contrast timing, patent. ANATOMIC VARIANTS: None DELAYED PHASE: No parenchymal contrast enhancement. Old right cerebellar infarct. Review of the MIP images confirms the above findings. IMPRESSION: No emergent large vessel occlusion or hemodynamically significant stenosis of the arteries of the head and neck. Electronically Signed   By: Deatra Robinson M.D.   On: 07/04/2018 18:44   Mr Brain Wo Contrast  Result Date: 07/04/2018 CLINICAL DATA:  Stroke follow-up. Nausea, vomiting and gait instability. EXAM: MRI HEAD WITHOUT CONTRAST TECHNIQUE: Multiplanar, multiecho pulse sequences of the brain and surrounding structures were obtained without intravenous contrast. COMPARISON:  CTA head neck 07/04/2018 FINDINGS: BRAIN: There is multifocal ischemia within the right cerebellar hemisphere, within the PICA territory. No other area of abnormal diffusion restriction. The midline structures are normal. No midline shift or other mass effect. Mild edema in the right cerebellum. Multifocal periventricular white matter hyperintensity, most often a result of chronic microvascular ischemia. The cerebral and cerebellar volume are age-appropriate. There is magnetic susceptibility effect along the course of the right PICA, likely indicating the presence of thrombus. VASCULAR: Major intracranial arterial and venous sinus flow voids are normal. SKULL AND UPPER CERVICAL SPINE: Calvarial bone marrow signal is normal. There is no skull base mass. Visualized upper cervical spine and soft tissues are normal. SINUSES/ORBITS: No fluid levels or advanced mucosal thickening. No mastoid or middle ear effusion. The orbits are normal. IMPRESSION: 1. 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 No mass effect or acute hemorrhage. 2. Magnetic  susceptibility effect along the course of the right PICA likely indicates the presence of thrombus. This corresponds to areas of non opacification on the CTA of the head and neck, though the PICA origin is patent. Electronically Signed   By: Deatra Robinson M.D.   On: 07/04/2018 21:49   ASSESSMENT AND PLAN:   53 year old male with history of CAD status post PCI, essential hypertension, tobacco dependence who presents to the emergency room due to falls.  1.  Acute right sided cerebellar infarcts Patient presented with dizziness with evidence of nystagmus on exam  on presentation as well as falls.  Secondary to CVA. MRI of the brain done  revealed multifocal acute ischemic infarcts of the right cerebellum, CTA head and neck done revealed no emergent large vessel occlusion or hemodynamically significant stenosis of the arteries of the head and neck.  To follow-up on 2D echocardiogram report when done Physical therapist to evaluate and treat with recommendations.  Follow-up on speech and occupational therapy evaluation. Continue aspirin and Brilinta for now( From previous cardiology note due to patient symptoms or shortness of breath they were considering changing from Brilinta to Plavix however this has not been done according to the patient).  Lipid panel done with LDL of 109.  Continue high intensity statins Appreciate neurology input  2.  History of CAD status post PCI: Continue aspirin and Brilinta Continue metoprolol Continue Ranexa Statins  3.Tobacco dependence: Patient is encouraged to quit smoking and willing to attempt to quit was assessed. Patient highly motivated.Counseling was provided for 4 minutes.  4.  Hypertension: Allow for permissive hypertension due to recent CVA.  Most recent blood pressure of 150/91. Monitor  5.  Elevated troponin: Mildly elevated at 0.03 but stable.  Patient asymptomatic.  Outpatient follow-up with his cardiologist on discharge after resolution of  ongoing neurological symptoms  6.  Cocaine abuse Urine drug screen positive for cocaine.  Counseling done.  Patient motivated to quit on discharge  DVT prophylaxis; Lovenox   All the records are reviewed and case discussed with Care Management/Social Worker. Management plans discussed with the patient, family and they are in agreement.  CODE STATUS: Full Code  TOTAL TIME TAKING CARE OF THIS PATIENT: 36 minutes.   More than 50% of the time was spent in counseling/coordination of care: YES  POSSIBLE D/C IN 1-2 DAYS, DEPENDING ON CLINICAL CONDITION.   Bellah Alia M.D on 07/05/2018 at 11:59 AM  Between 7am to 6pm - Pager - [phone]  After 6pm go to [email or web address] - Scientist, research (life sciences) [facility]  Office  [phone]  CC: Primary care physician; Center, [facility]  Note: This dictation was prepared with Nurse, children's dictation along with smaller phrase technology. Any transcriptional errors that result from this process are unintentional.

## 2018-07-05 NOTE — Progress Notes (Signed)
OT Cancellation Note  Patient Details Name: MARKAL CEDILLOS MRN: [id number] DOB: 03/07/1966   Cancelled Treatment:    Reason Eval/Treat Not Completed: Patient at procedure or test/ unavailable. Consult received and chart reviewed.  Patient currently undergoing ECHO.  Will re-attempt at later time/date as medically appropriate and available.  Rockney Ghee, M.S., OTR/L Ascom: [phone] 07/05/18, 11:13 AM

## 2018-07-05 NOTE — Progress Notes (Signed)
PT Cancellation Note  Patient Details Name: [name] MRN: [id number] DOB: Apr 17, 1966   Cancelled Treatment:    Reason Eval/Treat Not Completed: Patient at procedure or test/unavailable(Consult received and chart reviewed.  Patient currently undergoing ECHO.  Will re-attempt at later time/date as medically appropriate and available.)   Noam Karaffa H. Manson Passey, PT, DPT, NCS 07/05/18, 11:06 AM [phone]

## 2018-07-06 LAB — BASIC METABOLIC PANEL
Anion gap: 8 (ref 5–15)
BUN: 21 mg/dL — ABNORMAL HIGH (ref 6–20)
CO2: 26 mmol/L (ref 22–32)
Calcium: 9.4 mg/dL (ref 8.9–10.3)
Chloride: 107 mmol/L (ref 98–111)
Creatinine, Ser: 0.85 mg/dL (ref 0.61–1.24)
GFR calc Af Amer: >60 mL/min (ref >60)
GFR calc non Af Amer: >60 mL/min (ref >60)
Glucose, Bld: 132 mg/dL — ABNORMAL HIGH (ref 70–99)
Potassium: 3.7 mmol/L (ref 3.5–5.1)
Sodium: 141 mmol/L (ref 135–145)

## 2018-07-06 LAB — MAGNESIUM: Magnesium: 2.5 mg/dL — ABNORMAL HIGH (ref 1.7–2.4)

## 2018-07-06 MED ORDER — POLYETHYLENE GLYCOL 3350 17 G PO PACK
17.0000 g | PACK | Freq: Every day | ORAL | Status: DC
  Administered 2018-07-06 – 2018-07-07 (×2): 17 g via ORAL
  Filled 2018-07-06 (×2): qty 1

## 2018-07-06 MED ORDER — CHLORPROMAZINE HCL 25 MG PO TABS
25.0000 mg | ORAL_TABLET | Freq: Three times a day (TID) | ORAL | Status: DC | PRN
  Administered 2018-07-06: 18:00:00 25 mg via ORAL
  Filled 2018-07-06 (×2): qty 1

## 2018-07-06 MED ORDER — SODIUM CHLORIDE 0.9 % IV SOLN
25.0000 mg | Freq: Once | INTRAVENOUS | Status: AC
  Administered 2018-07-06: 23:00:00 25 mg via INTRAVENOUS
  Filled 2018-07-06: qty 1

## 2018-07-06 NOTE — Progress Notes (Signed)
Occupational Therapy Treatment Patient Details Name: Kenneth Morales MRN: [id number] DOB: May 09, 1965 Today's Date: 07/06/2018    History of present illness presented to ER secondary to persistent nausea/vomiting, dizziness and gait instability (with multiple falls); admitted for TIA/CVA work up.  MRI significant for multifocal R cerebellar infarcts (no evidence of impending herniation)   OT comments  Pt seen for OT tx this date. Pt sleeping, easily awakes and eager to work with therapist. Pt requests to shave his face. Pt performed bed mobility with modified independence and sat EOB for approx 15 minutes to shave his face with set up, supervision, and occasional verbal cues for improving midline sitting posture. Wedge placed under R IT to improve upright posture. Pt demonstrating improved accuracy and hand-eye coordination noted this date compared to previous session during grooming tasks. Pt also reporting mild improvement in visual deficits, continues to have vertical diplopia but reports he is able to better read words on the whiteboard in his room, also improves with 1 eye closed. Pt continues to benefit from skilled OT services. Will increase the challenge next session. Continue to highly recommend CIR.    Follow Up Recommendations  CIR    Equipment Recommendations  (TBD)    Recommendations for Other Services      Precautions / Restrictions Precautions Precautions: Fall Restrictions Weight Bearing Restrictions: No       Mobility Bed Mobility Overal bed mobility: Modified Independent             General bed mobility comments: improved midline positoning with initial transition to upright this date; wedge under R IT for sustained sitting edge of bed  Transfers                      Balance Overall balance assessment: Needs assistance Sitting-balance support: No upper extremity supported;Feet supported Sitting balance-Leahy Scale: Fair Sitting balance - Comments:  improved midline with wedge under R IT for passive closure of R lateral trunk in unsupported sitting Postural control: Right lateral lean                                 ADL either performed or assessed with clinical judgement   ADL Overall ADL's : Needs assistance/impaired     Grooming: Set up;Supervision/safety;Sitting Grooming Details (indicate cue type and reason): with wedge under R IT for improved optimal midline posture while sitting, pt able to brush teeth and shave face with set up, supervision, and additional time to perform and for accuracy                                     Vision Baseline Vision/History: No visual deficits Patient Visual Report: Diplopia;Blurring of vision Vision Assessment?: Yes Additional Comments: Pt continues to complain of vertical diplopia but reports it's improving, stating "I can see the letters/words on the white board today", also improves with 1 eye occluded. May benefit from occlusion therapy.   Perception     Praxis      Cognition Arousal/Alertness: Awake/alert Behavior During Therapy: WFL for tasks assessed/performed Overall Cognitive Status: Within Functional Limits for tasks assessed  Exercises     Shoulder Instructions       General Comments      Pertinent Vitals/ Pain       Pain Assessment: No/denies pain  Home Living                                          Prior Functioning/Environment              Frequency  Min 3X/week        Progress Toward Goals  OT Goals(current goals can now be found in the care plan section)  Progress towards OT goals: Progressing toward goals  Acute Rehab OT Goals Patient Stated Goal: To regain strength and feel safe. OT Goal Formulation: With patient Time For Goal Achievement: 07/19/18 Potential to Achieve Goals: Good  Plan Discharge plan remains appropriate;Frequency  remains appropriate    Co-evaluation                 AM-PAC OT "6 Clicks" Daily Activity     Outcome Measure   Help from another person eating meals?: None Help from another person taking care of personal grooming?: A Little Help from another person toileting, which includes using toliet, bedpan, or urinal?: A Lot Help from another person bathing (including washing, rinsing, drying)?: A Lot Help from another person to put on and taking off regular upper body clothing?: A Little Help from another person to put on and taking off regular lower body clothing?: A Lot 6 Click Score: 16    End of Session    OT Visit Diagnosis: Unsteadiness on feet (R26.81);Other abnormalities of gait and mobility (R26.89);History of falling (Z91.81);Hemiplegia and hemiparesis Hemiplegia - Right/Left: Right Hemiplegia - dominant/non-dominant: Dominant Hemiplegia - caused by: Cerebral infarction   Activity Tolerance Patient tolerated treatment well   Patient Left in bed;with call bell/phone within reach;with bed alarm set;with family/visitor present   Nurse Communication          Time: 0459-9774 OT Time Calculation (min): 23 min  Charges: OT General Charges $OT Visit: 1 Visit OT Treatments $Self Care/Home Management : 23-37 mins  Richrd Prime, MPH, MS, OTR/L ascom [phone] 07/06/18, 3:41 PM

## 2018-07-06 NOTE — Progress Notes (Signed)
IP rehab admissions - We are following for possible inpatient rehab admission.  My partner, Luther Parody, spoke with patient and his dad.  Patient is interested in CIR here at [facility].  Case manager Terrilee Croak is aware of patient desire for CIR admission.  We will follow up in am for possible admit if patient is medically ready tomorrow.  Call [facility] for questions at [phone]

## 2018-07-06 NOTE — Progress Notes (Signed)
Physical Therapy Treatment Patient Details Name: Kenneth Morales MRN: [id number] DOB: [birth year] Today's Date: 07/06/2018    History of Present Illness presented to ER secondary to persistent nausea/vomiting, dizziness and gait instability (with multiple falls); admitted for TIA/CVA work up.  MRI significant for multifocal R cerebellar infarcts (no evidence of impending herniation)    PT Comments    Excellent participation with session this date.  Tolerating improved gait distance (40'), though continues to require +2 due to persistent balance/midline deficits.  R foot placement improved with use of visual cue for placement.  Patient now beginning to initiate self-initiate/correct return to midline in all planes with dynamic activities, functional mobility.  Continue to recommend bilat HHA for assist device due to persistent pushing behaviors when L UE in closed-chain contact with assist device.    Follow Up Recommendations  CIR     Equipment Recommendations       Recommendations for Other Services       Precautions / Restrictions Precautions Precautions: Fall Restrictions Weight Bearing Restrictions: No    Mobility  Bed Mobility Overal bed mobility: Modified Independent             General bed mobility comments: improved midline positoning with initial transition to upright this date; wedge under R IT for sustained sitting edge of bed  Transfers Overall transfer level: Needs assistance Equipment used: 2 person hand held assist Transfers: Sit to/from Stand Sit to Stand: Min assist;+2 physical assistance         General transfer comment: educated in proper foot placement/alignment prior to standing attempts; encouraged to visually check placement prior to loading  Ambulation/Gait Ambulation/Gait assistance: Mod assist;+2 physical assistance Gait Distance (Feet): 40 Feet Assistive device: 2 person hand held assist       General Gait Details: improved R foot  placement with visual cuing/feedback (maintaining placement on line on floor); step by step cuing for gait sequencing and midline correction.  Mod manual cuing for L ant/lateral weight shift; improved efforts/awareness of R TKE throughout gait cycle this date   Stairs             Wheelchair Mobility    Modified Rankin (Stroke Patients Only)       Balance Overall balance assessment: Needs assistance Sitting-balance support: No upper extremity supported;Feet supported Sitting balance-Leahy Scale: Fair Sitting balance - Comments: improved midline with wedge under R IT for passive closure of R lateral trunk in unsupported sitting     Standing balance-Leahy Scale: Poor Standing balance comment: maintains static stance with cga/min assist at times; withstands mild perturbations, all directions.  Mod assist +2 for dynamic balance, activites requiring altered BOS                            Cognition Arousal/Alertness: Awake/alert Behavior During Therapy: WFL for tasks assessed/performed Overall Cognitive Status: Within Functional Limits for tasks assessed                                        Exercises Other Exercises Other Exercises: Unsupported standing, participated with dynamic reaching activity (modified D2 extension with L UE) to promote weight shift/acceptance to L LE. Mod manual faciltiation for pelvic weight shift, self-initiation and correction improving with repetition.    General Comments        Pertinent Vitals/Pain Pain Assessment: Faces Faces Pain Scale: Hurts  a little bit Pain Location: Headache Pain Descriptors / Indicators: Aching;Grimacing Pain Intervention(s): Limited activity within patient's tolerance;Monitored during session;Repositioned    Home Living                      Prior Function            PT Goals (current goals can now be found in the care plan section) Acute Rehab PT Goals Patient Stated Goal:  To regain strength and feel safe. PT Goal Formulation: With patient Time For Goal Achievement: 07/19/18 Potential to Achieve Goals: Good Progress towards PT goals: Progressing toward goals    Frequency    7X/week      PT Plan Current plan remains appropriate    Co-evaluation              AM-PAC PT "6 Clicks" Mobility   Outcome Measure  Help needed turning from your back to your side while in a flat bed without using bedrails?: None Help needed moving from lying on your back to sitting on the side of a flat bed without using bedrails?: None Help needed moving to and from a bed to a chair (including a wheelchair)?: A Lot Help needed standing up from a chair using your arms (e.g., wheelchair or bedside chair)?: A Lot Help needed to walk in hospital room?: A Lot Help needed climbing 3-5 steps with a railing? : A Lot 6 Click Score: 16    End of Session Equipment Utilized During Treatment: Gait belt Activity Tolerance: Patient tolerated treatment well Patient left: with call bell/phone within reach;in chair;with chair alarm set Nurse Communication: Mobility status PT Visit Diagnosis: Hemiplegia and hemiparesis;Unsteadiness on feet (R26.81) Hemiplegia - Right/Left: Right Hemiplegia - dominant/non-dominant: Dominant Hemiplegia - caused by: Cerebral infarction     Time: 5053-9767 PT Time Calculation (min) (ACUTE ONLY): 34 min  Charges:  $Gait Training: 8-22 mins $Neuromuscular Re-education: 8-22 mins                     Fara Worthy H. Manson Passey, PT, DPT, NCS 07/06/18, 11:01 AM [phone]

## 2018-07-06 NOTE — PMR Pre-admission (Signed)
PMR Admission Coordinator Pre-Admission Assessment  Patient: Kenneth Morales is an 53 y.o., male MRN: [id number] DOB: February 07, 1966 Height:  (177.8 cm) Weight: 68.3 kg  Insurance Information HMO: no    PPO:      PCP:      IPA:      80/20:      OTHER:  PRIMARY: Medicaid [place] Access      Policy#: [id number]      Subscriber: self CM Name:       Phone#:      Fax#:  Pre-Cert#:       Employer:  Benefits:  Phone #: [phone]     Name:  Eff. Date: Eligible 07/06/2018 with coverage code [id number]     Deduct:       Out of Pocket Max:       Life Max:  CIR: covered per Medicaid guidelines      SNF:  Outpatient:      Co-Pay:  Home Health:       Co-Pay:  DME:      Co-Pay:  Providers:    Medicaid Application Date:       Case Manager:  Disability Application Date:       Case Worker:   Emergency Contact Information Contact Information    Name Relation Home Work Mobile   Dedmon,William H Father [phone]     Latanya Maudlin   [phone]      Current Medical History  Patient Admitting Diagnosis:  R Cerebellar CVA  History of Present Illness: Pt is a 53 y/o male with PMH significant for CAD (s/p PCI, not compliant with Brilinta 2/2 SOB), HTN, and tobacco dependence who presented to [facility] ER due to dizziness and falls.  Workup revealed R acute cerebellar infarct.  Echocardiogram revealed EF of >65% and L ventricular wall thickening.  CTA normal.  Pt with mildly elevated troponins but, per cardiology, to follow up as an outpatient. UDS positive for cocaine on admission, pt motivated to quit on d/c.  Per RN documentation, pt with episode of sweating/dizziness overnight on 3/13.  Given IV fluids and pt reports symptoms resolved and vitals are stable.  Dr. Enid Baas reports patient is stable for d/c.    Patient's medical record from [facility] has been reviewed by the rehabilitation admission coordinator and physician. NIH Stroke scale: 3   Past Medical History  Past Medical  History:  Diagnosis Date  . Acute MI (HCC) MAY 2013  . CAD (coronary artery disease)    NSTEMI in 05/13 in setting of Cocaine use. Cath: 99% mid LCX stenosis with a large thrombus. PCI and BMS (4.0 X15 mm Vision) placement to mid LCX, LAD: 20%, RCA: 30%, EF: 60%.   . Cocaine abuse (HCC)    quit in 08/2011  . Depression   . Hyperlipidemia   . Hypertension   . Spider bite   . Tobacco abuse     Family History   family history includes Heart attack in his maternal grandfather and paternal grandfather.  Prior Rehab/Hospitalizations Has the patient had major surgery during 100 days prior to admission? Had stents placed in his heart in November of 2019    Current Medications  Current Facility-Administered Medications:  .  acetaminophen (TYLENOL) tablet 650 mg, 650 mg, Oral, Q4H PRN, 650 mg at 07/05/18 1437 **OR** acetaminophen (TYLENOL) solution 650 mg, 650 mg, Per Tube, Q4H PRN **OR** acetaminophen (TYLENOL) suppository 650 mg, 650 mg, Rectal, Q4H PRN, Juliene Pina, Sital, MD .  amLODipine (  NORVASC) tablet 5 mg, 5 mg, Oral, Daily, Mody, Sital, MD, 5 mg at 07/07/18 0903 .  aspirin EC tablet 81 mg, 81 mg, Oral, Daily, Mody, Sital, MD, 81 mg at 07/07/18 0903 .  atorvastatin (LIPITOR) tablet 80 mg, 80 mg, Oral, q1800, Mody, Sital, MD, 80 mg at 07/06/18 1648 .  chlorproMAZINE (THORAZINE) tablet 25 mg, 25 mg, Oral, TID PRN, Enid Baas, Jude, MD, 25 mg at 07/06/18 1747 .  clopidogrel (PLAVIX) tablet 75 mg, 75 mg, Oral, Daily, Ojie, Jude, MD, 75 mg at 07/07/18 0903 .  enoxaparin (LOVENOX) injection 40 mg, 40 mg, Subcutaneous, Q24H, Mody, Sital, MD, 40 mg at 07/05/18 1437 .  famotidine (PEPCID) tablet 20 mg, 20 mg, Oral, BID, Mody, Sital, MD, 20 mg at 07/07/18 0903 .  metoprolol tartrate (LOPRESSOR) tablet 25 mg, 25 mg, Oral, BID, Mody, Sital, MD, 25 mg at 07/07/18 0903 .  nitroGLYCERIN (NITROSTAT) SL tablet 0.4 mg, 0.4 mg, Sublingual, Q5 min PRN, Shanlever, Charmayne Sheer, RPH .  polyethylene glycol (MIRALAX /  GLYCOLAX) packet 17 g, 17 g, Oral, Daily, Ojie, Jude, MD, 17 g at 07/07/18 0903 .  ranolazine (RANEXA) 12 hr tablet 500 mg, 500 mg, Oral, BID, Mody, Sital, MD, 500 mg at 07/06/18 2256 .  senna-docusate (Senokot-S) tablet 1 tablet, 1 tablet, Oral, QHS PRN, Juliene Pina, Sital, MD .  senna-docusate (Senokot-S) tablet 1 tablet, 1 tablet, Oral, Once, Ojie, Jude, MD  Patients Current Diet:   Diet Order            Diet Heart Room service appropriate? 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 Was driving  Prior Functional Level Do you want Prior Function Level of Independence: Independent Comments: Indep with ADLs, household and community distances without assist device; + driving; single-parent to 82 year old daughter from other? Self Care: Did the patient need help bathing, dressing, using the toilet or eating?  Independent  Indoor Mobility: Did the patient need assistance with walking from room to room (with or without device)? Independent  Stairs: Did the patient need assistance with internal or external stairs (with or without device)? Independent  Functional Cognition: Did the patient need help planning regular tasks such as shopping or remembering to take medications? Independent  Home Assistive Devices / Equipment Home Assistive Devices/Equipment: None Home Equipment: None  Prior Device Use: Indicate devices/aids used by the patient prior to current illness, exacerbation or injury? None of the above  Prior Functional Level Comments: Indep with ADLs, household and community distances without assist device; + driving;  single-parent to 19 year old daughter   Prior Functional Level Current Functional Level  Bed Mobility  Independent Modified Independent  Transfers  Independent Needs assistance  Mobility - Walk/Wheelchair  Independent   40' mod assist, +2 physical assistance  Mobility - Ambulation/Gait  Independent 14' Mod assist, +2 physical assistance  Upper Body Dressing  Independent Set up, Minimal assistance, Sitting, Cueing for sequencing  Lower Body Dressing  Independent Set up, Moderate assistance, +2 for safety/equipment, With adaptive equipment, Sit to/from stand  Grooming  Independent Set up, Supervision/safety, Sitting  Eating/Drinking  Independent  Set up, Minimal assistance, Sitting  Toilet Transfer  Independent Set up, BSC, Stand-pivot, +2 for safety/equipment, Cueing for sequencing, RW  Bladder Continence  Continent  Continent  Bowel Management   Continent  Continent, last BM 07/05/2018  Stair Climbing  Independent  No difficulties  Communication No difficulties No difficulties  Memory  No difficulties  No difficulties  Cooking/Meal Prep  Occupational psychologist  Independent   Driving   Independent     Special needs/care consideration BiPAP/CPAP no CPM no Continuous Drip IV no Dialysis no        Days  Life Vest no  Oxygen no Special Bed no Trach Size no Wound Vac (area) no      Location  Skin intact                              Location Bowel mgmt: continent, last BM 07/05/2018 Bladder mgmt: continent  Diabetic mgmt none  Previous Home Environment Living Arrangements: Children(single-parent of 58 year old daughter) Available Help at Discharge: Family, Available PRN/intermittently Type of Home: Apartment Home Layout: Two level Alternate Level Stairs-Rails: Left Alternate Level Stairs-Number of Steps: Pt could not live on main level. All bedrooms and bathrooms are on second level. Home Access: Level entry Bathroom Shower/Tub: Tub/shower  unit, Engineer, building services: Standard Home Care Services: No  Discharge Living Setting Plans for Discharge Living Setting: Patient's home Type of Home at Discharge: Apartment Discharge Home Layout: Two level, Bed/bath upstairs Alternate Level Stairs-Rails: Left Alternate Level Stairs-Number of Steps: 12 Discharge Home Access: Other (comment) Discharge Bathroom Shower/Tub: Tub/shower unit Discharge Bathroom Toilet: Standard Discharge Bathroom Accessibility: Yes How Accessible: Accessible via walker Does the patient have any problems obtaining your medications?: No  Social/Family/Support Systems Patient Roles: Parent Anticipated Caregiver: Dad [name] [phone]), Mom ([name], same number) Ability/Limitations of Caregiver: family will share 24/7 care.  I have discussed with pt, his father, and his mother the need for 24/7 assist at d/c as he will not be able to transition to SNF from CIR.  All agree that they will be able to provide 24/7 assist at d/c from CIR.  Caregiver Availability: 24/7 Discharge Plan Discussed with Primary Caregiver: Yes Is Caregiver In Agreement with Plan?: Yes Does Caregiver/Family have Issues with Lodging/Transportation while Pt is in Rehab?: No  Goals/Additional Needs Patient/Family Goal for Rehab: PT/OT supervision, SLP n/a  Expected length of stay: 14-18 days Cultural Considerations: none Equipment Needs: tbd Pt/Family Agrees to Admission and willing to participate: Yes Program Orientation Provided & Reviewed with Pt/Caregiver Including Roles  & Responsibilities: Yes  Patient Condition: I have reviewed medical records from [facility], spoken with CM and patient and his parents. I spoke with patient and his parents via phone for inpatient rehabilitation assessment.  Patient will benefit from ongoing PT, and OT, can actively participate in 3 hours of therapy a day 5 days of the week, and can make measurable gains during the  admission.  Patient will also benefit from the coordinated team approach during an Inpatient Acute Rehabilitation admission.  The patient will receive intensive therapy as well as Rehabilitation physician, nursing, social worker, and care management interventions.  Due to safety, disease management, medical administration, pain management, and patient education the patient requires 24 hour a day rehabilitation nursing.  The patient is currently min to mod assist with mobility and basic ADLs.  Discharge  setting and therapy post discharge at home with home health is anticipated.  Patient has agreed to participate in the Acute Inpatient Rehabilitation Program and will admit today.  Preadmission Screen Completed By:  Stephania Fragmin, 07/07/2018 9:53 AM ______________________________________________________________________   Discussed status with Dr. Riley Kill on 07/07/18 at 9:53 AM  and received telephone approval for admission today.  Admission Coordinator:  Stephania Fragmin, time 9:53 AM/Date 07/07/18   Assessment/Plan: Diagnosis: right cerebellar CVA 1. Does the need for close, 24 hr/day  Medical supervision in concert with the patient's rehab needs make it unreasonable for this patient to be served in a less intensive setting? Yes 2. Co-Morbidities requiring supervision/potential complications: HTN, CAD 3. Due to bladder management, bowel management, safety, skin/wound care, disease management, medication administration, pain management and patient education, does the patient require 24 hr/day rehab nursing? Yes 4. Does the patient require coordinated care of a physician, rehab nurse, PT (1-2 hrs/day, 5 days/week) and OT (1-2 hrs/day, 5 days/week) to address physical and functional deficits in the context of the above medical diagnosis(es)? Yes Addressing deficits in the following areas: balance, endurance, locomotion, strength, transferring, bowel/bladder control, bathing, dressing, feeding, grooming,  toileting and psychosocial support 5. Can the patient actively participate in an intensive therapy program of at least 3 hrs of therapy 5 days a week? Yes 6. The potential for patient to make measurable gains while on inpatient rehab is excellent 7. Anticipated functional outcomes upon discharge from inpatients are: supervision PT, supervision OT, n/a SLP 8. Estimated rehab length of stay to reach the above functional goals is: 14-18 days 9. Anticipated D/C setting: Home 10. Anticipated post D/C treatments: HH therapy 11. Overall Rehab/Functional Prognosis: excellent   Ranelle Oyster, MD, [facility] Physical Medicine & Rehabilitation 07/07/2018  Stephania Fragmin 07/07/2018

## 2018-07-06 NOTE — Progress Notes (Signed)
Subjective: Patient reports that vision has improved.  Continues to have gait instability  Objective: Current vital signs: BP 136/90 (BP Location: Left Arm)   Pulse 66   Temp 98 F (36.7 C) (Oral)   Resp 19   Ht 5\' 10"  (1.778 m)   Wt 68.3 kg   SpO2 96%   BMI 21.61 kg/m  Vital signs in last 24 hours: Temp:  [98 F (36.7 C)-98.8 F (37.1 C)] 98 F (36.7 C) (03/12 0526) Pulse Rate:  [62-68] 66 (03/12 0526) Resp:  [16-19] 19 (03/12 0526) BP: (136-164)/(83-107) 136/90 (03/12 0526) SpO2:  [96 %-99 %] 96 % (03/12 0526)  Intake/Output from previous day: 03/11 0701 - 03/12 0700 In: 480 [P.O.:480] Out: 500 [Urine:500] Intake/Output this shift: Total I/O In: 240 [P.O.:240] Out: 450 [Urine:450] Nutritional status:  Diet Order            Diet Heart Room service appropriate? Yes; Fluid consistency: Thin  Diet effective now              Neurologic Exam: Mental Status: Alert, oriented, thought content appropriate. Speech fluent without evidence of aphasia. Able to follow 3 step commands without difficulty. Cranial Nerves: II: Discs flat bilaterally; Visual fields grossly normal, pupils equal, round, reactive to light and accommodation III,IV, ZS:[id number], extra-ocular motions intact without nystagmus V,VII:mild right facial droop, facial light touch sensation normal bilaterally VIII: hearing normal bilaterally IX,X: gag reflex present XI: bilateral shoulder shrug XII: midline tongue extension Motor: 5/5 throughout Sensory: Pinprick and light touchdecreased on the left upper and lower extremities   Lab Results: Basic Metabolic Panel: Recent Labs  Lab 07/02/18 1548 07/04/18 1309 07/06/18 0550  NA 141 137 141  K 3.5 3.7 3.7  CL 111 108 107  CO2 25 23 26   GLUCOSE 104* 108* 132*  BUN 8 14 21*  CREATININE 0.93 0.89 0.85  CALCIUM 7.8* 8.9 9.4  MG  --   --  2.5*    Liver Function Tests: Recent Labs  Lab 07/02/18 1548 07/04/18 1309  AST 24 24  ALT  14 16  ALKPHOS 72 66  BILITOT 1.4* 0.8  PROT 6.8 7.5  ALBUMIN 3.9 3.8   Recent Labs  Lab 07/02/18 1548  LIPASE 24   No results for input(s): AMMONIA in the last 168 hours.  CBC: Recent Labs  Lab 07/02/18 1429 07/04/18 1309  WBC 8.6 7.6  NEUTROABS 6.8  --   HGB 16.8 15.4  HCT 48.7 44.6  MCV 95.9 96.1  PLT 268 245    Cardiac Enzymes: Recent Labs  Lab 07/02/18 1548 07/04/18 1309 07/04/18 1536 07/04/18 2047 07/05/18 0303  TROPONINI <0.03 0.04* 0.03* 0.03* 0.03*    Lipid Panel: Recent Labs  Lab 07/05/18 0303  CHOL 172  TRIG 143  HDL 34*  CHOLHDL 5.1  VLDL 29  LDLCALC 920*    CBG: Recent Labs  Lab 07/04/18 1256  GLUCAP 108*    Microbiology: Results for orders placed or performed during the hospital encounter of 01/26/18  MRSA PCR Screening     Status: None   Collection Time: 01/26/18  9:07 PM  Result Value Ref Range Status   MRSA by PCR NEGATIVE NEGATIVE Final    Comment:        The GeneXpert MRSA Assay (FDA approved for NASAL specimens only), is one component of a comprehensive MRSA colonization surveillance program. It is not intended to diagnose MRSA infection nor to guide or monitor treatment for MRSA infections. Performed at [facility], [address], [place], [place] [address]     Coagulation Studies: No results for input(s): LABPROT, INR in the last 72 hours.  Imaging: Ct Angio Head W Or Wo Contrast  Result Date: 07/04/2018 CLINICAL DATA:  Stroke follow-up. Falls and generalized weakness. EXAM: CT ANGIOGRAPHY HEAD AND NECK TECHNIQUE: Multidetector CT imaging of the head and neck was performed using the standard protocol during bolus administration of intravenous contrast. Multiplanar CT image reconstructions and MIPs were obtained to evaluate the vascular anatomy. Carotid stenosis measurements (when applicable) are obtained utilizing NASCET criteria, using the distal internal carotid diameter as the denominator. CONTRAST:   37mL OMNIPAQUE IOHEXOL 350 MG/ML SOLN COMPARISON:  Head CT 07/04/2018 FINDINGS: CTA NECK FINDINGS SKELETON: There is no bony spinal canal stenosis. No lytic or blastic lesion. OTHER NECK: Normal pharynx, larynx and major salivary glands. No cervical lymphadenopathy. Unremarkable thyroid gland. UPPER CHEST: No pneumothorax or pleural effusion. No nodules or masses. AORTIC ARCH: There is no calcific atherosclerosis of the aortic arch. There is no aneurysm, dissection or hemodynamically significant stenosis of the visualized ascending aorta and aortic arch. Conventional 3 vessel aortic branching pattern. The visualized proximal subclavian arteries are widely patent. RIGHT CAROTID SYSTEM: --Common carotid artery: Widely patent origin without common carotid artery dissection or aneurysm. --Internal carotid artery: Normal without aneurysm, dissection or stenosis. --External carotid artery: No acute abnormality. LEFT CAROTID SYSTEM: --Common carotid artery: Widely patent origin without common carotid artery dissection or aneurysm. --Internal carotid artery: Normal without aneurysm, dissection or stenosis. --External carotid artery: No acute abnormality. VERTEBRAL ARTERIES: Right dominant configuration. Both origins are normal. No dissection, occlusion or flow-limiting stenosis to the vertebrobasilar confluence. CTA HEAD FINDINGS POSTERIOR CIRCULATION: --Vertebral arteries: Normal codominant configuration of V4 segments. --Posterior inferior cerebellar arteries (PICA): Patent origins from the vertebral arteries. --Anterior inferior cerebellar arteries (AICA): Not clearly visualized on the right. Normal left. --Basilar artery: Normal. --Superior cerebellar arteries: Normal. --Posterior cerebral arteries (PCA): Normal. Both originate from the basilar artery. Posterior communicating arteries (p-comm) are diminutive or absent. ANTERIOR CIRCULATION: --Intracranial internal carotid arteries: Normal. --Anterior cerebral arteries  (ACA): Normal. Both A1 segments are present. Patent anterior communicating artery (a-comm). --Middle cerebral arteries (MCA): Normal. VENOUS SINUSES: As permitted by contrast timing, patent. ANATOMIC VARIANTS: None DELAYED PHASE: No parenchymal contrast enhancement. Old right cerebellar infarct. Review of the MIP images confirms the above findings. IMPRESSION: No emergent large vessel occlusion or hemodynamically significant stenosis of the arteries of the head and neck. Electronically Signed   By: Deatra Robinson M.D.   On: 07/04/2018 18:44   Ct Head Wo Contrast  Result Date: 07/04/2018 CLINICAL DATA:  53 year old male with weakness and ataxia for 2 days. EXAM: CT HEAD WITHOUT CONTRAST TECHNIQUE: Contiguous axial images were obtained from the base of the skull through the vertex without intravenous contrast. COMPARISON:  08/15/2014 and CT FINDINGS: Brain: Small to moderate RIGHT cerebellar infarct is noted without hemorrhage, new since 08/15/2014. No other infarct identified. No mass lesion, midline shift, hydrocephalus or extra-axial collection identified. Vascular: No hyperdense vessel or unexpected calcification. Skull: Normal. Negative for fracture or focal lesion. Sinuses/Orbits: No acute finding. Other: None. 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  No hemorrhage. Electronically Signed   By: Harmon Pier M.D.   On: 07/04/2018 13:42   Ct Angio Neck W Or Wo Contrast  Result Date: 07/04/2018 CLINICAL DATA:  Stroke follow-up. Falls and generalized weakness. EXAM: CT ANGIOGRAPHY HEAD AND NECK TECHNIQUE: Multidetector CT imaging of the head and neck  was performed using the standard protocol during bolus administration of intravenous contrast. Multiplanar CT image reconstructions and MIPs were obtained to evaluate the vascular anatomy. Carotid stenosis measurements (when applicable) are obtained utilizing NASCET criteria, using the distal internal carotid diameter as the denominator. CONTRAST:   75mL OMNIPAQUE IOHEXOL 350 MG/ML SOLN COMPARISON:  Head CT 07/04/2018 FINDINGS: CTA NECK FINDINGS SKELETON: There is no bony spinal canal stenosis. No lytic or blastic lesion. OTHER NECK: Normal pharynx, larynx and major salivary glands. No cervical lymphadenopathy. Unremarkable thyroid gland. UPPER CHEST: No pneumothorax or pleural effusion. No nodules or masses. AORTIC ARCH: There is no calcific atherosclerosis of the aortic arch. There is no aneurysm, dissection or hemodynamically significant stenosis of the visualized ascending aorta and aortic arch. Conventional 3 vessel aortic branching pattern. The visualized proximal subclavian arteries are widely patent. RIGHT CAROTID SYSTEM: --Common carotid artery: Widely patent origin without common carotid artery dissection or aneurysm. --Internal carotid artery: Normal without aneurysm, dissection or stenosis. --External carotid artery: No acute abnormality. LEFT CAROTID SYSTEM: --Common carotid artery: Widely patent origin without common carotid artery dissection or aneurysm. --Internal carotid artery: Normal without aneurysm, dissection or stenosis. --External carotid artery: No acute abnormality. VERTEBRAL ARTERIES: Right dominant configuration. Both origins are normal. No dissection, occlusion or flow-limiting stenosis to the vertebrobasilar confluence. CTA HEAD FINDINGS POSTERIOR CIRCULATION: --Vertebral arteries: Normal codominant configuration of V4 segments. --Posterior inferior cerebellar arteries (PICA): Patent origins from the vertebral arteries. --Anterior inferior cerebellar arteries (AICA): Not clearly visualized on the right. Normal left. --Basilar artery: Normal. --Superior cerebellar arteries: Normal. --Posterior cerebral arteries (PCA): Normal. Both originate from the basilar artery. Posterior communicating arteries (p-comm) are diminutive or absent. ANTERIOR CIRCULATION: --Intracranial internal carotid arteries: Normal. --Anterior cerebral arteries  (ACA): Normal. Both A1 segments are present. Patent anterior communicating artery (a-comm). --Middle cerebral arteries (MCA): Normal. VENOUS SINUSES: As permitted by contrast timing, patent. ANATOMIC VARIANTS: None DELAYED PHASE: No parenchymal contrast enhancement. Old right cerebellar infarct. Review of the MIP images confirms the above findings. IMPRESSION: No emergent large vessel occlusion or hemodynamically significant stenosis of the arteries of the head and neck. Electronically Signed   By: Deatra Robinson M.D.   On: 07/04/2018 18:44   Mr Brain Wo Contrast  Result Date: 07/04/2018 CLINICAL DATA:  Stroke follow-up. Nausea, vomiting and gait instability. EXAM: MRI HEAD WITHOUT CONTRAST TECHNIQUE: Multiplanar, multiecho pulse sequences of the brain and surrounding structures were obtained without intravenous contrast. COMPARISON:  CTA head neck 07/04/2018 FINDINGS: BRAIN: There is multifocal ischemia within the right cerebellar hemisphere, within the PICA territory. No other area of abnormal diffusion restriction. The midline structures are normal. No midline shift or other mass effect. Mild edema in the right cerebellum. Multifocal periventricular white matter hyperintensity, most often a result of chronic microvascular ischemia. The cerebral and cerebellar volume are age-appropriate. There is magnetic susceptibility effect along the course of the right PICA, likely indicating the presence of thrombus. VASCULAR: Major intracranial arterial and venous sinus flow voids are normal. SKULL AND UPPER CERVICAL SPINE: Calvarial bone marrow signal is normal. There is no skull base mass. Visualized upper cervical spine and soft tissues are normal. SINUSES/ORBITS: No fluid levels or advanced mucosal thickening. No mastoid or middle ear effusion. The orbits are normal. IMPRESSION: 1. 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 No mass effect or acute hemorrhage. 2.  Magnetic susceptibility effect along the course of the right PICA likely indicates the presence of thrombus. This corresponds to areas  of non opacification on the CTA of the head and neck, though the PICA origin is patent. Electronically Signed   By: Deatra Robinson M.D.   On: 07/04/2018 21:49    Medications:  I have reviewed the patient's current medications. Scheduled: . amLODipine  5 mg Oral Daily  . aspirin EC  81 mg Oral Daily  . atorvastatin  80 mg Oral q1800  . clopidogrel  75 mg Oral Daily  . enoxaparin (LOVENOX) injection  40 mg Subcutaneous Q24H  . famotidine  20 mg Oral BID  . metoprolol tartrate  25 mg Oral BID  . nitroGLYCERIN  0.4 mg Sublingual UD  . polyethylene glycol  17 g Oral Daily  . ranolazine  500 mg Oral BID    Assessment/Plan: Patient showing signs of improvement.  No new neurological complaints.  Echocardiogram shows LVH with an EF of >65% with no intracardiac source of emboli.     Recommendations: 1.  Continue statin, ASA and Plavix 2.  Continue therapy 3.  No further neurologic intervention is recommended at this time.  If further questions arise, please call or page at that time.  Thank you for allowing neurology to participate in the care of this patient.  Patient to follow up with neurology on an outpatient basis    LOS: 2 days   Thana Farr, MD Neurology [phone] 07/06/2018  12:37 PM

## 2018-07-06 NOTE — Progress Notes (Signed)
[employer] - [place] at [facility]   PATIENT NAME: Kenneth Morales    MR#:  [id number]  DATE OF BIRTH:  [birth year]  SUBJECTIVE:  CHIEF COMPLAINT:   Chief Complaint  Patient presents with  . Weakness   No new complaint this morning.  Moving all extremities with no deficit.  Still complains of feeling dizzy especially with ambulation with physical therapy..  Diagnosed with acute right cerebellar infarct.  No chest pain.  No shortness of breath  REVIEW OF SYSTEMS:  Review of Systems  Constitutional: Negative for chills, fever and weight loss.  HENT: Negative for hearing loss and tinnitus.   Eyes: Negative for blurred vision, photophobia and pain.  Respiratory: Negative for cough and hemoptysis.   Cardiovascular: Negative for chest pain and palpitations.  Gastrointestinal: Negative for abdominal pain, heartburn, nausea and vomiting.  Genitourinary: Negative for dysuria and urgency.  Musculoskeletal: Negative for myalgias.  Skin: Negative for itching and rash.  Neurological: Positive for dizziness. Negative for tingling and focal weakness.  Psychiatric/Behavioral: Positive for substance abuse. Negative for depression.    DRUG ALLERGIES:   Allergies  Allergen Reactions  . Brilinta [Ticagrelor] Shortness Of Breath  . Penicillins Rash    Has patient had a PCN reaction causing immediate rash, facial/tongue/throat swelling, SOB or lightheadedness with hypotension: Yes Has patient had a PCN reaction causing severe rash involving mucus membranes or skin necrosis: No Has patient had a PCN reaction that required hospitalization: No Has patient had a PCN reaction occurring within the last 10 years: No If all of the above answers are "NO", then may proceed with Cephalosporin use.   VITALS:  Blood pressure 136/90, pulse 66, temperature 98 F (36.7 C), temperature source Oral, resp. rate 19, height 5\' 10"  (1.778 m), weight 68.3 kg, SpO2 96 %. PHYSICAL EXAMINATION:    Physical Exam  Constitutional: He is oriented to person, place, and time. He appears well-developed and well-nourished.  HENT:  Head: Normocephalic and atraumatic.  Right Ear: External ear normal.  Mouth/Throat: Oropharynx is clear and moist.  Eyes: Pupils are equal, round, and reactive to light. Conjunctivae and EOM are normal. Right eye exhibits no discharge.  Neck: Normal range of motion. Neck supple. No tracheal deviation present.  Cardiovascular: Normal rate, regular rhythm and normal heart sounds.  Respiratory: Effort normal and breath sounds normal. No respiratory distress.  GI: Soft. Bowel sounds are normal. He exhibits no distension.  Musculoskeletal: Normal range of motion.        General: No edema.  Neurological: He is alert and oriented to person, place, and time. No cranial nerve deficit.  Gait not checked  Skin: Skin is warm. He is not diaphoretic. No erythema.  Psychiatric: He has a normal mood and affect. His behavior is normal.   LABORATORY PANEL:  Male CBC Recent Labs  Lab 07/04/18 1309  WBC 7.6  HGB 15.4  HCT 44.6  PLT 245   ------------------------------------------------------------------------------------------------------------------ Chemistries  Recent Labs  Lab 07/04/18 1309 07/06/18 0550  NA 137 141  K 3.7 3.7  CL 108 107  CO2 23 26  GLUCOSE 108* 132*  BUN 14 21*  CREATININE 0.89 0.85  CALCIUM 8.9 9.4  MG  --  2.5*  AST 24  --   ALT 16  --   ALKPHOS 66  --   BILITOT 0.8  --    RADIOLOGY:  No results found. ASSESSMENT AND PLAN:   53 year old male with history of CAD status post PCI, essential hypertension,  tobacco dependence who presents to the emergency room due to falls.  1.  Acute right sided cerebellar infarcts Patient presented with dizziness with evidence of nystagmus on exam on presentation as well as falls.  Secondary to CVA. MRI of the brain done  revealed multifocal acute ischemic infarcts of the right cerebellum, CTA head  and neck done revealed no emergent large vessel occlusion or hemodynamically significant stenosis of the arteries of the head and neck.  2D echocardiogram done with ejection fraction of 65%.  No gross valvular abnormalities/vegetation noted.  Seen by speech therapist.  No further skilled physical therapy needs recommended. Reevaluated by neurologist.  Patient was on aspirin and Brilinta prior to admission.  From previous cardiology note due to patient symptoms or shortness of breath they were considering changing from Brilinta to Plavix however this has not been done according to the patient).  Brilinta discontinued and Plavix added to aspirin as recommended by neurologist.  Lipid panel done with LDL of 109.  Continue high intensity statins Appreciate neurology input  2.  History of CAD status post PCI: Continue aspirin and Plavix  Continue metoprolol Continue Ranexa Statins  3.Tobacco dependence: Patient is encouraged to quit smoking and willing to attempt to quit was assessed. Patient highly motivated.Counseling was provided for 4 minutes.  4.  Hypertension: Allow for permissive hypertension due to recent CVA.  Most recent blood pressure of 150/91. Monitor  5.  Elevated troponin: Mildly elevated at 0.03 but stable.  Patient asymptomatic.  Outpatient follow-up with his cardiologist on discharge after resolution of ongoing neurological symptoms  6.  Cocaine abuse Urine drug screen positive for cocaine.  Counseling done.  Patient motivated to quit on discharge  DVT prophylaxis; Lovenox Disposition; case manager working on inpatient rehab placement  All the records are reviewed and case discussed with Care Management/Social Worker. Management plans discussed with the patient, family and they are in agreement.  CODE STATUS: Full Code  TOTAL TIME TAKING CARE OF THIS PATIENT: 37 minutes.   More than 50% of the time was spent in counseling/coordination of care: YES  POSSIBLE D/C IN  1-2 DAYS, DEPENDING ON CLINICAL CONDITION.   Michelle Vanhise M.D on 07/06/2018 at 1:02 PM  Between 7am to 6pm - Pager - [phone]  After 6pm go to [email or web address] - Scientist, research (life sciences) [facility]  Office  [phone]  CC: Primary care physician; Center, [facility]  Note: This dictation was prepared with Nurse, children's dictation along with smaller phrase technology. Any transcriptional errors that result from this process are unintentional.

## 2018-07-06 NOTE — TOC Initial Note (Signed)
Transition of Care [facility]) - Initial/Assessment Note    Patient Details  Name: Kenneth Morales MRN: [id number] Date of Birth: [birth year]  Transition of Care [facility]) CM/SW Contact:    Annamaria Boots, [place] Phone Number: 07/06/2018, 12:33 PM  Clinical Narrative:  PT has recommended CIR for patient. CSW met with patient to discuss discharge plan. Patient is in agreement with CIR and really hopes that he is accepted into CIR. Patient states that he lives in [place] with his 43 year old daughter who has special needs. Patient is a single father and has some family support but no other additional support in the home. Patient reports that his extended family is currently watching his daughter while he is in the hospital. Patient reports that his mother and brother are also helping him during this time and can assist when he goes home. CSW notified CIR that patient is interested in facility. TOC will continue to follow for discharge planning.                  Expected Discharge Plan: IP Rehab Facility Barriers to Discharge: Continued Medical Work up   Patient Goals and CMS Choice Patient states their goals for this hospitalization and ongoing recovery are:: Patient would like to go to CIR to get stronger and return home  CMS Medicare.gov Compare Post Acute Care list provided to:: Patient Choice offered to / list presented to : Patient  Expected Discharge Plan and Services Expected Discharge Plan: [place]   Post Acute Care Choice: IP Rehab Living arrangements for the past 2 months: Single Family Home Expected Discharge Date: 07/06/18                        Prior Living Arrangements/Services Living arrangements for the past 2 months: Single Family Home Lives with:: Self, Minor Children Patient language and need for interpreter reviewed:: Yes        Need for Family Participation in Patient Care: Yes (Comment) Care giver support system in place?: Yes (comment)   Criminal  Activity/Legal Involvement Pertinent to Current Situation/Hospitalization: No - Comment as needed  Activities of Daily Living Home Assistive Devices/Equipment: None ADL Screening (condition at time of admission) Patient's cognitive ability adequate to safely complete daily activities?: Yes Is the patient deaf or have difficulty hearing?: No Does the patient have difficulty seeing, even when wearing glasses/contacts?: Yes Does the patient have difficulty concentrating, remembering, or making decisions?: No Patient able to express need for assistance with ADLs?: Yes Does the patient have difficulty dressing or bathing?: No Independently performs ADLs?: Yes (appropriate for developmental age) Does the patient have difficulty walking or climbing stairs?: Yes Weakness of Legs: Both Weakness of Arms/Hands: None  Permission Sought/Granted                  Emotional Assessment Appearance:: Appears stated age   Affect (typically observed): Accepting, Hopeful Orientation: : Oriented to Self, Oriented to Place, Oriented to  Time, Oriented to Situation Alcohol / Substance Use: Not Applicable Psych Involvement: No (comment)  Admission diagnosis:  Ataxia [R27.0] Stroke (cerebrum) (HCC) [I63.9] Cerebrovascular accident (CVA), unspecified mechanism ([place]) [I63.9] Patient Active Problem List   Diagnosis Date Noted  . Stroke (cerebrum) ([place]) 07/04/2018  . Unstable angina ([place]) 01/26/2018  . NSTEMI (non-ST elevated myocardial infarction) ([place]) 11/28/2017  . 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 Chest pain 07/01/2016  . SOB (shortness of breath) 12/10/2011  . CAD (coronary artery disease)   .  Hyperlipidemia   . Hypertension   . Tobacco abuse   . Cocaine abuse [facility])    PCP:  [facility], [place]:   [place], [place] - [address] [place] [address] Phone: [phone] Fax:  [phone] [place], [place] - [place] [address] Phone: [phone] Fax: [phone]     Social Determinants of Health (SDOH) Interventions    Readmission Risk Interventions 30 Day Unplanned Readmission Risk Score     ED to Hosp-Admission (Current) from 07/04/2018 in [place] (1C)  30 Day Unplanned Readmission Risk Score (%)  21 Filed at 07/06/2018 1200     This score is the patient's risk of an unplanned readmission within 30 days of being discharged (0 -100%). The score is based on dignosis, age, lab data, medications, orders, and past utilization.   Low:  0-14.9   Medium: 15-21.9   High: 22-29.9   Extreme: 30 and above       No flowsheet data found.

## 2018-07-07 ENCOUNTER — Other Ambulatory Visit: Payer: Self-pay

## 2018-07-07 ENCOUNTER — Inpatient Hospital Stay (HOSPITAL_COMMUNITY)
Admission: RE | Admit: 2018-07-07 | Discharge: 2018-07-15 | DRG: 057 | Disposition: A | Discharge status: Home Health | Payer: Medicaid Other | Source: Other Acute Inpatient Hospital | Attending: Physical Medicine & Rehabilitation | Admitting: Physical Medicine & Rehabilitation

## 2018-07-07 ENCOUNTER — Encounter (HOSPITAL_COMMUNITY): Payer: Self-pay

## 2018-07-07 DIAGNOSIS — Z880 Allergy status to penicillin: Secondary | ICD-10-CM

## 2018-07-07 DIAGNOSIS — I69393 Ataxia following cerebral infarction: Secondary | ICD-10-CM

## 2018-07-07 DIAGNOSIS — I10 Essential (primary) hypertension: Secondary | ICD-10-CM

## 2018-07-07 DIAGNOSIS — Z7982 Long term (current) use of aspirin: Secondary | ICD-10-CM | POA: Diagnosis not present

## 2018-07-07 DIAGNOSIS — Z9119 Patient's noncompliance with other medical treatment and regimen: Secondary | ICD-10-CM

## 2018-07-07 DIAGNOSIS — I951 Orthostatic hypotension: Secondary | ICD-10-CM | POA: Diagnosis not present

## 2018-07-07 DIAGNOSIS — Z955 Presence of coronary angioplasty implant and graft: Secondary | ICD-10-CM | POA: Diagnosis not present

## 2018-07-07 DIAGNOSIS — R269 Unspecified abnormalities of gait and mobility: Secondary | ICD-10-CM

## 2018-07-07 DIAGNOSIS — K5900 Constipation, unspecified: Secondary | ICD-10-CM | POA: Diagnosis present

## 2018-07-07 DIAGNOSIS — I2510 Atherosclerotic heart disease of native coronary artery without angina pectoris: Secondary | ICD-10-CM | POA: Diagnosis present

## 2018-07-07 DIAGNOSIS — I252 Old myocardial infarction: Secondary | ICD-10-CM

## 2018-07-07 DIAGNOSIS — R066 Hiccough: Secondary | ICD-10-CM | POA: Diagnosis not present

## 2018-07-07 DIAGNOSIS — F1910 Other psychoactive substance abuse, uncomplicated: Secondary | ICD-10-CM | POA: Diagnosis not present

## 2018-07-07 DIAGNOSIS — Z888 Allergy status to other drugs, medicaments and biological substances status: Secondary | ICD-10-CM

## 2018-07-07 DIAGNOSIS — H532 Diplopia: Secondary | ICD-10-CM | POA: Diagnosis present

## 2018-07-07 DIAGNOSIS — I69354 Hemiplegia and hemiparesis following cerebral infarction affecting left non-dominant side: Secondary | ICD-10-CM | POA: Diagnosis present

## 2018-07-07 DIAGNOSIS — Z7902 Long term (current) use of antithrombotics/antiplatelets: Secondary | ICD-10-CM

## 2018-07-07 DIAGNOSIS — E785 Hyperlipidemia, unspecified: Secondary | ICD-10-CM | POA: Diagnosis present

## 2018-07-07 DIAGNOSIS — I639 Cerebral infarction, unspecified: Secondary | ICD-10-CM | POA: Diagnosis not present

## 2018-07-07 DIAGNOSIS — Z8249 Family history of ischemic heart disease and other diseases of the circulatory system: Secondary | ICD-10-CM

## 2018-07-07 DIAGNOSIS — I69398 Other sequelae of cerebral infarction: Secondary | ICD-10-CM

## 2018-07-07 LAB — CBC
HCT: 40.8 % (ref 39.0–52.0)
Hemoglobin: 14.1 g/dL (ref 13.0–17.0)
MCH: 32.9 pg (ref 26.0–34.0)
MCHC: 34.6 g/dL (ref 30.0–36.0)
MCV: 95.1 fL (ref 80.0–100.0)
Platelets: 219 10*3/uL (ref 150–400)
RBC: 4.29 MIL/uL (ref 4.22–5.81)
RDW: 12.7 % (ref 11.5–15.5)
WBC: 8.1 10*3/uL (ref 4.0–10.5)
nRBC: 0 % (ref 0.0–0.2)

## 2018-07-07 LAB — CREATININE, SERUM
Creatinine, Ser: 1 mg/dL (ref 0.61–1.24)
GFR calc Af Amer: >60 mL/min
GFR calc non Af Amer: >60 mL/min

## 2018-07-07 MED ORDER — RANOLAZINE ER 500 MG PO TB12
500.0000 mg | ORAL_TABLET | Freq: Two times a day (BID) | ORAL | Status: DC
  Administered 2018-07-07 – 2018-07-15 (×16): 500 mg via ORAL
  Filled 2018-07-07 (×16): qty 1

## 2018-07-07 MED ORDER — ACETAMINOPHEN 160 MG/5ML PO SOLN: 650.0000 mg | ORAL | Status: DC | PRN

## 2018-07-07 MED ORDER — ASPIRIN EC 81 MG PO TBEC
81.0000 mg | DELAYED_RELEASE_TABLET | Freq: Every day | ORAL | Status: DC
  Administered 2018-07-08 – 2018-07-15 (×8): 81 mg via ORAL
  Filled 2018-07-07 (×8): qty 1

## 2018-07-07 MED ORDER — SORBITOL 70 % SOLN: 30.0000 mL | Freq: Every day | Status: DC | PRN

## 2018-07-07 MED ORDER — ENOXAPARIN SODIUM 40 MG/0.4ML ~~LOC~~ SOLN: 40.0000 mg | SUBCUTANEOUS | Status: DC

## 2018-07-07 MED ORDER — SENNOSIDES-DOCUSATE SODIUM 8.6-50 MG PO TABS
1.0000 | ORAL_TABLET | Freq: Once | ORAL | Status: AC
  Administered 2018-07-07: 11:00:00 1 via ORAL
  Filled 2018-07-07: qty 1

## 2018-07-07 MED ORDER — SENNOSIDES-DOCUSATE SODIUM 8.6-50 MG PO TABS
1.0000 | ORAL_TABLET | Freq: Every evening | ORAL | Status: DC | PRN
  Administered 2018-07-12: 1 via ORAL
  Filled 2018-07-07: qty 1

## 2018-07-07 MED ORDER — AMLODIPINE BESYLATE 5 MG PO TABS
5.0000 mg | ORAL_TABLET | Freq: Every day | ORAL | Status: DC
  Administered 2018-07-08 – 2018-07-12 (×5): 5 mg via ORAL
  Filled 2018-07-07 (×5): qty 1

## 2018-07-07 MED ORDER — ACETAMINOPHEN 325 MG PO TABS
650.0000 mg | ORAL_TABLET | ORAL | Status: DC | PRN
  Administered 2018-07-12: 650 mg via ORAL
  Filled 2018-07-07 (×2): qty 2

## 2018-07-07 MED ORDER — BACLOFEN 5 MG HALF TABLET
5.0000 mg | ORAL_TABLET | Freq: Four times a day (QID) | ORAL | Status: DC | PRN
  Administered 2018-07-07 – 2018-07-08 (×2): 5 mg via ORAL
  Filled 2018-07-07 (×2): qty 1

## 2018-07-07 MED ORDER — FAMOTIDINE 20 MG PO TABS
20.0000 mg | ORAL_TABLET | Freq: Two times a day (BID) | ORAL | Status: DC
  Administered 2018-07-07 – 2018-07-15 (×16): 20 mg via ORAL
  Filled 2018-07-07 (×16): qty 1

## 2018-07-07 MED ORDER — POLYETHYLENE GLYCOL 3350 17 G PO PACK
17.0000 g | PACK | Freq: Every day | ORAL | Status: DC
  Administered 2018-07-08 – 2018-07-15 (×8): 17 g via ORAL
  Filled 2018-07-07 (×8): qty 1

## 2018-07-07 MED ORDER — ACETAMINOPHEN 650 MG RE SUPP: 650.0000 mg | RECTAL | Status: DC | PRN

## 2018-07-07 MED ORDER — CLOPIDOGREL BISULFATE 75 MG PO TABS
75.0000 mg | ORAL_TABLET | Freq: Every day | ORAL | Status: DC
  Administered 2018-07-08 – 2018-07-15 (×8): 75 mg via ORAL
  Filled 2018-07-07 (×8): qty 1

## 2018-07-07 MED ORDER — SENNOSIDES-DOCUSATE SODIUM 8.6-50 MG PO TABS: 1.0000 | ORAL_TABLET | Freq: Every evening | ORAL | 0 refills | Status: DC | PRN

## 2018-07-07 MED ORDER — POLYETHYLENE GLYCOL 3350 17 G PO PACK: 17.0000 g | PACK | Freq: Every day | ORAL | 0 refills | Status: DC

## 2018-07-07 MED ORDER — ENOXAPARIN SODIUM 40 MG/0.4ML ~~LOC~~ SOLN
40.0000 mg | SUBCUTANEOUS | Status: DC
  Administered 2018-07-07 – 2018-07-14 (×8): 40 mg via SUBCUTANEOUS
  Filled 2018-07-07 (×8): qty 0.4

## 2018-07-07 MED ORDER — NITROGLYCERIN 0.4 MG SL SUBL: 0.4000 mg | SUBLINGUAL_TABLET | SUBLINGUAL | Status: DC | PRN

## 2018-07-07 MED ORDER — ATORVASTATIN CALCIUM 80 MG PO TABS
80.0000 mg | ORAL_TABLET | Freq: Every day | ORAL | Status: DC
  Administered 2018-07-07 – 2018-07-14 (×8): 80 mg via ORAL
  Filled 2018-07-07 (×8): qty 1

## 2018-07-07 MED ORDER — LACTATED RINGERS IV SOLN
INTRAVENOUS | Status: AC
  Administered 2018-07-07: 02:00:00 via INTRAVENOUS

## 2018-07-07 MED ORDER — CLOPIDOGREL BISULFATE 75 MG PO TABS: 75.0000 mg | ORAL_TABLET | Freq: Every day | ORAL | 0 refills | Status: DC

## 2018-07-07 MED ORDER — CHLORPROMAZINE HCL 25 MG PO TABS
25.0000 mg | ORAL_TABLET | Freq: Three times a day (TID) | ORAL | Status: DC | PRN
  Administered 2018-07-07 – 2018-07-10 (×4): 25 mg via ORAL
  Filled 2018-07-07 (×6): qty 1

## 2018-07-07 MED ORDER — METOPROLOL TARTRATE 25 MG PO TABS
25.0000 mg | ORAL_TABLET | Freq: Two times a day (BID) | ORAL | Status: DC
  Administered 2018-07-07 – 2018-07-11 (×8): 25 mg via ORAL
  Filled 2018-07-07 (×8): qty 1

## 2018-07-07 NOTE — Plan of Care (Signed)
  Problem: Education: Goal: Knowledge of General Education information will improve Description Including pain rating scale, medication(s)/side effects and non-pharmacologic comfort measures 07/07/2018 0403 by Donnel Saxon, RN Outcome: Progressing 07/07/2018 0324 by Donnel Saxon, RN Outcome: Progressing   Problem: Activity: Goal: Risk for activity intolerance will decrease 07/07/2018 0403 by Donnel Saxon, RN Outcome: Progressing 07/07/2018 0324 by Donnel Saxon, RN Outcome: Progressing   Problem: Safety: Goal: Ability to remain free from injury will improve 07/07/2018 0403 by Donnel Saxon, RN Outcome: Progressing 07/07/2018 0324 by Donnel Saxon, RN Outcome: Progressing   Problem: Education: Goal: Knowledge of disease or condition will improve 07/07/2018 0403 by Donnel Saxon, RN Outcome: Progressing 07/07/2018 0324 by Donnel Saxon, RN Outcome: Progressing Goal: Knowledge of secondary prevention will improve 07/07/2018 0403 by Donnel Saxon, RN Outcome: Progressing 07/07/2018 0324 by Donnel Saxon, RN Outcome: Progressing Goal: Knowledge of patient specific risk factors addressed and post discharge goals established will improve 07/07/2018 0403 by Donnel Saxon, RN Outcome: Progressing 07/07/2018 0324 by Donnel Saxon, RN Outcome: Progressing   Problem: Ischemic Stroke/TIA Tissue Perfusion: Goal: Complications of ischemic stroke/TIA will be minimized 07/07/2018 0403 by Donnel Saxon, RN Outcome: Progressing 07/07/2018 0324 by Donnel Saxon, RN Outcome: Progressing

## 2018-07-07 NOTE — H&P (Signed)
Physical Medicine and Rehabilitation Admission H&P    Chief Complaint  Patient presents with  . Weakness   Chief complaint: dizziness  HPI: Kenneth Morales is a 53 year old right-handed male with history of CAD status post PCI maintained on Brilinta/aspirin present but noncompliant, tobacco/polysubstance abuse, hypertension maintained on Norvasc 5 mg twice daily, Lopressor 25 mg twice a day. Per chart review patient lives with 50 year old daughter. Two-level home. Independent prior to admission.Presented 07/04/2018 with dyspnea, nausea and intermittent shortness of breath as well as numerous falls. He was recently seen in the ED for a viral illness placed on antibiotic therapy and discharged home. CT of the head showed right cerebellar infarction likely subacute no hemorrhage. Patient did not receive TPA.Urine drug screen positive for cocaine. CT angiogram of head and neck with no large vessel occlusion. 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 No mass effect. Echocardiogram with ejection fraction of 65% hyperdynamic systolic function. Neurology follow-up currently on aspirin and Plavix for CVA prophylaxis. Patient's Brilinta has been discontinued. Subcutaneous Lovenox for DVT prophylaxis. Tolerating a regular diet. Therapy evaluations completed with recommendations of physical medicine rehabilitation consult patient was admitted for a comprehensive rehabilitation program.  Review of Systems  Constitutional: Negative for chills and fever.  HENT: Negative for hearing loss.   Eyes: Negative for double vision.  Respiratory: Positive for shortness of breath. Negative for cough.   Cardiovascular: Positive for chest pain. Negative for palpitations and leg swelling.  Gastrointestinal: Positive for nausea and vomiting.  Genitourinary: Negative for dysuria, flank pain and hematuria.   Musculoskeletal: Positive for falls and myalgias.  Skin: Negative for rash.  Neurological: Positive for dizziness, weakness and headaches. Negative for seizures.  Psychiatric/Behavioral: Positive for depression. The patient has insomnia.   All other systems reviewed and are negative.  Past Medical History:  Diagnosis Date  . Acute MI (HCC) MAY 2013  . CAD (coronary artery disease)    NSTEMI in 05/13 in setting of Cocaine use. Cath: 99% mid LCX stenosis with a large thrombus. PCI and BMS (4.0 X15 mm Vision) placement to mid LCX, LAD: 20%, RCA: 30%, EF: 60%.   . Cocaine abuse (HCC)    quit in 08/2011  . Depression   . Hyperlipidemia   . Hypertension   . Spider bite   . Tobacco abuse    Past Surgical History:  Procedure Laterality Date  . CARDIAC CATHETERIZATION  MAY 2013   s/p stent @ [facility]  . CARDIAC CATHETERIZATION    . CORONARY CTO INTERVENTION N/A 01/27/2018   Procedure: CORONARY CTO INTERVENTION;  Surgeon: Corky Crafts, MD;  Location: [facility] INVASIVE CV LAB;  Service: Cardiovascular;  Laterality: N/A;  . CORONARY STENT INTERVENTION N/A 11/29/2017   Procedure: CORONARY STENT INTERVENTION;  Surgeon: Alwyn Pea, MD;  Location: [facility] INVASIVE CV LAB;  Service: Cardiovascular;  Laterality: N/A;  . CORONARY STENT INTERVENTION N/A 01/27/2018   Procedure: CORONARY STENT INTERVENTION;  Surgeon: Corky Crafts, MD;  Location: [facility] INVASIVE CV LAB;  Service: Cardiovascular;  Laterality: N/A;  mid cfx  . HEMORRHOID SURGERY    . LEFT HEART CATH AND CORONARY ANGIOGRAPHY Right 11/29/2017   Procedure: Left heart catheterization with possible PCI;  Surgeon: Laurier Nancy, MD;  Location: [facility] INVASIVE CV LAB;  Service: Cardiovascular;  Laterality: Right;  . LEFT HEART CATH AND CORONARY ANGIOGRAPHY N/A 01/27/2018   Procedure: LEFT HEART CATH AND  CORONARY ANGIOGRAPHY;  Surgeon: Corky Crafts, MD;  Location: [facility] INVASIVE CV LAB;  Service: Cardiovascular;  Laterality: N/A;   Family  History  Problem Relation Age of Onset  . Heart attack Maternal Grandfather   . Heart attack Paternal Grandfather    Social History:  reports that he has been smoking cigarettes. He has a 15.00 pack-year smoking history. He has never used smokeless tobacco. He reports current alcohol use of about 6.0 standard drinks of alcohol per week. He reports that he does not use drugs. Allergies:  Allergies  Allergen Reactions  . Brilinta [Ticagrelor] Shortness Of Breath  . Penicillins Rash    Has patient had a PCN reaction causing immediate rash, facial/tongue/throat swelling, SOB or lightheadedness with hypotension: Yes Has patient had a PCN reaction causing severe rash involving mucus membranes or skin necrosis: No Has patient had a PCN reaction that required hospitalization: No Has patient had a PCN reaction occurring within the last 10 years: No If all of the above answers are "NO", then may proceed with Cephalosporin use.   Medications Prior to Admission  Medication Sig Dispense Refill  . amLODipine (NORVASC) 5 MG tablet Take 1 tablet (5 mg total) by mouth daily. 30 tablet 5  . aspirin 81 MG tablet Take 1 tablet (81 mg total) by mouth daily. 30 tablet 2  . atorvastatin (LIPITOR) 80 MG tablet Take 1 tablet (80 mg total) by mouth daily at 6 PM. 30 tablet 5  . metoprolol tartrate (LOPRESSOR) 25 MG tablet Take 25 mg by mouth 2 (two) times daily.    . nitroGLYCERIN (NITROSTAT) 0.4 MG SL tablet Place 0.4 mg under the tongue as directed. Place 1 tablet (0.4mg  total) under the tongue every 5 minutes as needed for chest pain. May take 3 doses.    Marland Kitchen omeprazole (PRILOSEC) 20 MG capsule Take 20 mg by mouth daily.    . ranolazine (RANEXA) 500 MG 12 hr tablet Take 500 mg by mouth 2 (two) times daily.    . ticagrelor (BRILINTA) 90 MG TABS tablet Take 1 tablet (90 mg total) by mouth every 12 (twelve) hours. 60 tablet 5    Drug Regimen Review Drugregimen was reviewed and remains appropriate with no  significant issues identified  Home: Home Living Family/patient expects to be discharged to:: Private residence Living Arrangements: Children(single-parent of 19 year old daughter) Available Help at Discharge: Family, Available PRN/intermittently Type of Home: Apartment Home Access: Level entry Home Layout: Two level Alternate Level Stairs-Number of Steps: Pt could not live on main level. All bedrooms and bathrooms are on second level. Alternate Level Stairs-Rails: Left Bathroom Shower/Tub: Tub/shower unit, Engineer, building services: Standard Home Equipment: None   Functional History: Prior Function Level of Independence: Independent Comments: Indep with ADLs, household and community distances without assist device; + driving; single-parent to 73 year old daughter  Functional Status:  Mobility: Bed Mobility Overal bed mobility: Modified Independent General bed mobility comments: improved midline positoning with initial transition to upright this date; wedge under R IT for sustained sitting edge of bed Transfers Overall transfer level: Needs assistance Equipment used: 2 person hand held assist Transfers: Sit to/from Stand Sit to Stand: Min assist, +2 physical assistance General transfer comment: educated in proper foot placement/alignment prior to standing attempts; encouraged to visually check placement prior to loading Ambulation/Gait Ambulation/Gait assistance: Mod assist, +2 physical assistance Gait Distance (Feet): 40 Feet Assistive device: 2 person hand held assist General Gait Details: improved R foot placement with visual cuing/feedback (maintaining  placement on line on floor); step by step cuing for gait sequencing and midline correction.  Mod manual cuing for L ant/lateral weight shift; improved efforts/awareness of R TKE throughout gait cycle this date    ADL: ADL Overall ADL's : Needs assistance/impaired Eating/Feeding: Set up, Minimal assistance, Sitting  Eating/Feeding Details (indicate cue type and reason): Pt indicates he has had difficulty loading his spoon unsure if d/t double vision or sensation loss in hands.  Grooming: Set up, Supervision/safety, Sitting Grooming Details (indicate cue type and reason): with wedge under R IT for improved optimal midline posture while sitting, pt able to brush teeth and shave face with set up, supervision, and additional time to perform and for accuracy Upper Body Bathing: Set up, Sitting, Cueing for sequencing, Minimal assistance, Cueing for safety Lower Body Bathing: Set up, Moderate assistance, Sit to/from stand, With adaptive equipment, Cueing for safety, Cueing for sequencing, +2 for safety/equipment Upper Body Dressing : Set up, Minimal assistance, Sitting, Cueing for sequencing Lower Body Dressing: Set up, Moderate assistance, +2 for safety/equipment, With adaptive equipment, Sit to/from stand Toilet Transfer: Set up, BSC, Stand-pivot, +2 for safety/equipment, Cueing for sequencing, RW Toileting- Clothing Manipulation and Hygiene: Minimal assistance, Set up, Sit to/from stand, +2 for safety/equipment, With adaptive equipment Functional mobility during ADLs: Moderate assistance, +2 for physical assistance, +2 for safety/equipment General ADL Comments: Pt very unstable on feet and loses balance easily. Requies +2 for safety/assistance with functional mobility and sts transfers.   Cognition: Cognition Overall Cognitive Status: Within Functional Limits for tasks assessed Orientation Level: Oriented X4 Cognition Arousal/Alertness: Awake/alert Behavior During Therapy: WFL for tasks assessed/performed Overall Cognitive Status: Within Functional Limits for tasks assessed General Comments: limited insight into severity of deficits  Physical Exam: Blood pressure (!) 140/99, pulse 70, temperature 98.2 F (36.8 C), temperature source Oral, resp. rate 20, height 5\' 10"  (1.778 m), weight 68.3 kg, SpO2 98 %.  Physical Exam  Constitutional: He is oriented to person, place, and time. He appears well-developed and well-nourished.  HENT:  Head: Normocephalic and atraumatic.  Eyes: Pupils are equal, round, and reactive to light. EOM are normal.  Neck: Normal range of motion. No tracheal deviation present. No thyromegaly present.  Cardiovascular: Normal rate and regular rhythm. Exam reveals no gallop and no friction rub.  No murmur heard. Respiratory: Effort normal. No respiratory distress. He has no wheezes. He has no rales.  GI: Soft. He exhibits no distension. There is no abdominal tenderness. There is no rebound.  Musculoskeletal: Normal range of motion.        General: No deformity or edema.  Neurological: He is alert and oriented to person, place, and time.  Pt with reasonable insight and awareness. Normal memory. Language normal. Speech clear. Mild diplopia in all fields, gaze does not appear grossly dysconjugate. No nystagmus. No gross limb ataxia. Notes decreased sensation to palm and fingers of left hand, slight tingling fingers right hand. Motor 4+/5 in all 4 limbs proximal to distal. Began to hiccup at beginning of the exam.   Skin: Skin is warm.  Psychiatric: He has a normal mood and affect. His behavior is normal. Judgment and thought content normal.    Results for orders placed or performed during the hospital encounter of 07/04/18 (from the past 48 hour(s))  Basic metabolic panel     Status: Abnormal   Collection Time: 07/06/18  5:50 AM  Result Value Ref Range   Sodium 141 135 - 145 mmol/L   Potassium 3.7 3.5 -  5.1 mmol/L   Chloride 107 98 - 111 mmol/L   CO2 26 22 - 32 mmol/L   Glucose, Bld 132 (H) 70 - 99 mg/dL   BUN 21 (H) 6 - 20 mg/dL   Creatinine, Ser 6.28 0.61 - 1.24 mg/dL   Calcium 9.4 8.9 - 63.8 mg/dL   GFR calc non Af Amer >60 >60 mL/min   GFR calc Af Amer >60 >60 mL/min   Anion gap 8 5 - 15    Comment: Performed at [facility], [address]  [place], [place] [address]  Magnesium     Status: Abnormal   Collection Time: 07/06/18  5:50 AM  Result Value Ref Range   Magnesium 2.5 (H) 1.7 - 2.4 mg/dL    Comment: Performed at [facility], [address] [place], [place] [address]   No results found.     Medical Problem List and Plan: 1.  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 Pt with ongoing diplopia, hiccups, balance and coordination deficits.  -admit to inpatient rehab  -add prn baclofen for hiccups 2.  Antithrombotics: -DVT/anticoagulation:  Subcutaneous Lovenox  -antiplatelet therapy: aspirin 81 mg daily, Plavix 75 mg daily 3. Pain Management:  Tylenol as needed 4. Mood:  Provide emotional support  -antipsychotic agents: None 5. Neuropsych: This patient is capable of making decisions on his own behalf. 6. Skin/Wound Care:  Routine skin checks 7. Fluids/Electrolytes/Nutrition:  Routine ins and outs with follow-up chemistries 8. Lopressor 25 mg twice a day, Norvasc 5 mg daily. Monitor with increased mobility 9. History of CAD with myocardial infarction/stenting. Continue aspirin and Plavix. Patient was noncompliant with Brilinta. Continue Ranexa 500 mg twice a day 10. Hyperlipidemia. Lipitor 11. History of tobacco polysubstance abuse. Urine drug screen positive for cocaine. Provide counseling 12. Questionable medical noncompliance. Provide counseling 13. Constipation. Laxative assistance, augment regimen as needed       Charlton Amor, PA-C 07/07/2018

## 2018-07-07 NOTE — TOC Transition Note (Addendum)
Transition of Care [facility]) - CM/SW Discharge Note   Patient Details  Name: KJELL KIELAR MRN: [id number] Date of Birth: [birth year]  [facility] CM/SW Contact:  Gwenette Greet, RN Phone Number: 07/07/2018, 10:14 AM   Clinical Narrative:   Agreed to go to [facility].     Final next level of care: Acute to Acute Transfer Barriers to Discharge: No Barriers Identified   Patient Goals and CMS Choice Patient states their goals for this hospitalization and ongoing recovery are:: Patient would like to go to CIR to get stronger and return home  CMS Medicare.gov Compare Post Acute Care list provided to:: Patient Choice offered to / list presented to : Patient  Discharge Placement Discharge today to [facility].                        Discharge Plan and Services   Post Acute Care Choice: IP Rehab                    Social Determinants of Health (SDOH) Interventions     Readmission Risk Interventions No flowsheet data found.

## 2018-07-07 NOTE — Progress Notes (Signed)
Upon neuro assessment, patient was excessively sweating. Patient stated he was dizzy, vital signs WNL, answering questions appropriately. Notifed Dr. Bosie Helper, received verbal order for one time dose of fluids.

## 2018-07-07 NOTE — Progress Notes (Signed)
Ranelle Oyster, MD  Physician  Physical Medicine and Rehabilitation  PMR Pre-admission  Signed  Date of Service:  07/06/2018 4:07 PM       Related encounter: ED to Hosp-Admission (Discharged) from 07/04/2018 in [facility] (1C)      Signed         Show:Clear all [x] Manual[x] Template[] Copied  Added by: [x] Ranelle Oyster, MD[x] Stephania Fragmin, PT  [] Hover for details PMR Admission Coordinator Pre-Admission Assessment  Patient: Kenneth Morales is an 53 y.o., male MRN: [id number] DOB: 11/08/1965 Height: 5\' 10"  (177.8 cm) Weight: 68.3 kg  Insurance Information HMO: no    PPO:      PCP:      IPA:      80/20:      OTHER:  PRIMARY: Medicaid [place] Access      Policy#: [id number]      Subscriber: self CM Name:       Phone#:      Fax#:  Pre-Cert#:       Employer:  Benefits:  Phone #: [phone]     Name:  Eff. Date: Eligible 07/06/2018 with coverage code [id number]     Deduct:       Out of Pocket Max:       Life Max:  CIR: covered per Medicaid guidelines      SNF:  Outpatient:      Co-Pay:  Home Health:       Co-Pay:  DME:      Co-Pay:  Providers:    Medicaid Application Date:       Case Manager:  Disability Application Date:       Case Worker:   Emergency Contact Information         Contact Information    Name Relation Home Work Mobile   Bill,William H Father [phone]     Latanya Maudlin   [phone]      Current Medical History  Patient Admitting Diagnosis:  R Cerebellar CVA  History of Present Illness: Pt is a 53 y/o male with PMH significant for CAD (s/p PCI, not compliant with Brilinta 2/2 SOB), HTN, and tobacco dependence who presented to [facility] ER due to dizziness and falls.  Workup revealed R acute cerebellar infarct.  Echocardiogram revealed EF of >65% and L ventricular wall thickening.  CTA normal.  Pt with mildly elevated troponins but, per cardiology, to follow up as an outpatient. UDS positive for  cocaine on admission, pt motivated to quit on d/c.  Per RN documentation, pt with episode of sweating/dizziness overnight on 3/13.  Given IV fluids and pt reports symptoms resolved and vitals are stable.  Dr. Enid Baas reports patient is stable for d/c.    Patients medical record from [facility] has been reviewed by the rehabilitation admission coordinator and physician. NIH Stroke scale: 3   Past Medical History      Past Medical History:  Diagnosis Date   Acute MI (HCC) MAY 2013   CAD (coronary artery disease)    NSTEMI in 05/13 in setting of Cocaine use. Cath: 99% mid LCX stenosis with a large thrombus. PCI and BMS (4.0 X15 mm Vision) placement to mid LCX, LAD: 20%, RCA: 30%, EF: 60%.    Cocaine abuse (HCC)    quit in 08/2011   Depression    Hyperlipidemia    Hypertension    Spider bite    Tobacco abuse     Family History   family history includes Heart attack  in his maternal grandfather and paternal grandfather.  Prior Rehab/Hospitalizations Has the patient had major surgery during 100 days prior to admission? Had stents placed in his heart in November of 2019          Current Medications  Current Facility-Administered Medications:    acetaminophen (TYLENOL) tablet 650 mg, 650 mg, Oral, Q4H PRN, 650 mg at 07/05/18 1437 **OR** acetaminophen (TYLENOL) solution 650 mg, 650 mg, Per Tube, Q4H PRN **OR** acetaminophen (TYLENOL) suppository 650 mg, 650 mg, Rectal, Q4H PRN, Mody, Sital, MD   amLODipine (NORVASC) tablet 5 mg, 5 mg, Oral, Daily, Mody, Sital, MD, 5 mg at 07/07/18 4098   aspirin EC tablet 81 mg, 81 mg, Oral, Daily, Mody, Sital, MD, 81 mg at 07/07/18 0903   atorvastatin (LIPITOR) tablet 80 mg, 80 mg, Oral, q1800, Mody, Sital, MD, 80 mg at 07/06/18 1648   chlorproMAZINE (THORAZINE) tablet 25 mg, 25 mg, Oral, TID PRN, Enid Baas, Jude, MD, 25 mg at 07/06/18 1747   clopidogrel (PLAVIX) tablet 75 mg, 75 mg, Oral, Daily, Ojie, Jude,  MD, 75 mg at 07/07/18 0903   enoxaparin (LOVENOX) injection 40 mg, 40 mg, Subcutaneous, Q24H, Mody, Sital, MD, 40 mg at 07/05/18 1437   famotidine (PEPCID) tablet 20 mg, 20 mg, Oral, BID, Mody, Sital, MD, 20 mg at 07/07/18 0903   metoprolol tartrate (LOPRESSOR) tablet 25 mg, 25 mg, Oral, BID, Mody, Sital, MD, 25 mg at 07/07/18 1191   nitroGLYCERIN (NITROSTAT) SL tablet 0.4 mg, 0.4 mg, Sublingual, Q5 min PRN, Shanlever, Charles M, RPH   polyethylene glycol (MIRALAX / GLYCOLAX) packet 17 g, 17 g, Oral, Daily, Ojie, Jude, MD, 17 g at 07/07/18 4782   ranolazine (RANEXA) 12 hr tablet 500 mg, 500 mg, Oral, BID, Mody, Sital, MD, 500 mg at 07/06/18 2256   senna-docusate (Senokot-S) tablet 1 tablet, 1 tablet, Oral, QHS PRN, Juliene Pina, Sital, MD   senna-docusate (Senokot-S) tablet 1 tablet, 1 tablet, Oral, Once, Ojie, Jude, MD  Patients Current Diet:      Diet Order                  Diet Heart Room service appropriate? 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 Was driving  Prior Functional Level Do you want Prior Function Level of Independence: Independent Comments: Indep with ADLs, household and community distances without assist device; + driving; single-parent to 55 year old daughter from other? Self Care: Did the patient need help bathing, dressing, using the toilet or eating?  Independent  Indoor Mobility: Did the patient need assistance with walking from room to room (with or without device)? Independent  Stairs: Did the patient need assistance with internal or external stairs (with or without device)?  Independent  Functional Cognition: Did the patient need help planning regular tasks such as shopping or remembering to take medications? Independent  Home Assistive Devices / Equipment Home Assistive Devices/Equipment: None Home Equipment: None  Prior Device Use: Indicate devices/aids used by the patient prior to current illness, exacerbation or injury? None of the above  Prior Functional Level Comments: Indep with ADLs, household and community  distances without assist device; + driving; single-parent to 16 year old daughter   Prior Functional Level Current Functional Level  Bed Mobility  Independent Modified Independent  Transfers  Independent Needs assistance  Mobility - Walk/Wheelchair  Independent   40' mod assist, +2 physical assistance  Mobility - Ambulation/Gait  Independent 52' Mod assist, +2 physical assistance  Upper Body Dressing  Independent Set up, Minimal assistance, Sitting, Cueing for sequencing  Lower Body Dressing  Independent Set up, Moderate assistance, +2 for safety/equipment, With adaptive equipment, Sit to/from stand  Grooming  Independent Set up, Supervision/safety, Sitting  Eating/Drinking  Independent Set up, Minimal assistance, Sitting  Toilet Transfer  Independent Set up, BSC, Stand-pivot, +2 for safety/equipment, Cueing for sequencing, RW  Bladder Continence  Continent  Continent  Bowel Management   Continent  Continent, last BM 07/05/2018  Stair Climbing  Independent  No difficulties  Communication No difficulties No difficulties  Memory  No difficulties  No difficulties  Cooking/Meal Prep  Occupational psychologist  Independent   Driving   Independent     Special needs/care consideration BiPAP/CPAP no CPM no Continuous Drip IV no Dialysis no        Days  Life Vest no  Oxygen no Special Bed no Trach Size no Wound Vac (area) no      Location  Skin intact                               Location Bowel mgmt: continent, last BM 07/05/2018 Bladder mgmt: continent  Diabetic mgmt none  Previous Home Environment Living Arrangements: Children(single-parent of 71 year old daughter) Available Help at Discharge: Family, Available PRN/intermittently Type of Home: Apartment Home Layout: Two level Alternate Level Stairs-Rails: Left Alternate Level Stairs-Number of Steps: Pt could not live on main level. All bedrooms and bathrooms are on second level. Home Access: Level entry Bathroom Shower/Tub: Tub/shower unit, Engineer, building services: Standard Home Care Services: No  Discharge Living Setting Plans for Discharge Living Setting: Patient's home Type of Home at Discharge: Apartment Discharge Home Layout: Two level, Bed/bath upstairs Alternate Level Stairs-Rails: Left Alternate Level Stairs-Number of Steps: 12 Discharge Home Access: Other (comment) Discharge Bathroom Shower/Tub: Tub/shower unit Discharge Bathroom Toilet: Standard Discharge Bathroom Accessibility: Yes How Accessible: Accessible via walker Does the patient have any problems obtaining your medications?: No  Social/Family/Support Systems Patient Roles: Parent Anticipated Caregiver: Dad [name] [phone]), Mom ([name], same number) Ability/Limitations of Caregiver: family will share 24/7 care.  I have discussed with pt, his father, and his mother the need for 24/7 assist at d/c as he will not be able to transition to SNF from CIR.  All agree that they will be able to provide 24/7 assist at d/c from CIR.  Caregiver Availability: 24/7 Discharge Plan Discussed with Primary Caregiver: Yes Is Caregiver In Agreement with Plan?: Yes Does Caregiver/Family have Issues with Lodging/Transportation while Pt is in Rehab?: No  Goals/Additional Needs Patient/Family Goal for Rehab: PT/OT supervision, SLP n/a  Expected length of stay: 14-18 days Cultural Considerations: none Equipment Needs: tbd Pt/Family Agrees to  Admission and willing to participate: Yes Program Orientation Provided & Reviewed with Pt/Caregiver Including Roles  & Responsibilities: Yes  Patient Condition: I have reviewed medical records from [facility], spoken with CM and patient and his parents. I spoke with patient and his parents via phone for inpatient rehabilitation assessment.  Patient will benefit from ongoing PT, and OT, can actively participate in 3 hours of therapy a day 5 days of the week, and can make measurable gains during the admission.  Patient will also benefit from the coordinated team approach during an Inpatient Acute Rehabilitation admission.  The patient will receive intensive therapy as well as Rehabilitation physician, nursing, social worker, and care management interventions.  Due to safety, disease management, medical administration, pain management, and patient education the patient requires 24 hour a day rehabilitation nursing.  The patient is currently min to mod assist with mobility and basic ADLs.  Discharge setting and therapy post discharge at home with home health is anticipated.  Patient has agreed to participate in the Acute Inpatient Rehabilitation Program and will admit today.  Preadmission Screen Completed By:  Stephania Fragmin, 07/07/2018 9:53 AM ______________________________________________________________________   Discussed status with Dr. Riley Kill on 07/07/18 at 9:53 AM  and received telephone approval for admission today.  Admission Coordinator:  Stephania Fragmin, time 9:53 AM/Date 07/07/18   Assessment/Plan: Diagnosis: right cerebellar CVA 1. Does the need for close, 24 hr/day  Medical supervision in concert with the patient's rehab needs make it unreasonable for this patient to be served in a less intensive setting? Yes 2. Co-Morbidities requiring supervision/potential complications: HTN, CAD 3. Due to bladder management, bowel management, safety, skin/wound care, disease  management, medication administration, pain management and patient education, does the patient require 24 hr/day rehab nursing? Yes 4. Does the patient require coordinated care of a physician, rehab nurse, PT (1-2 hrs/day, 5 days/week) and OT (1-2 hrs/day, 5 days/week) to address physical and functional deficits in the context of the above medical diagnosis(es)? Yes Addressing deficits in the following areas: balance, endurance, locomotion, strength, transferring, bowel/bladder control, bathing, dressing, feeding, grooming, toileting and psychosocial support 5. Can the patient actively participate in an intensive therapy program of at least 3 hrs of therapy 5 days a week? Yes 6. The potential for patient to make measurable gains while on inpatient rehab is excellent 7. Anticipated functional outcomes upon discharge from inpatients are: supervision PT, supervision OT, n/a SLP 8. Estimated rehab length of stay to reach the above functional goals is: 14-18 days 9. Anticipated D/C setting: Home 10. Anticipated post D/C treatments: HH therapy 11. Overall Rehab/Functional Prognosis: excellent   Ranelle Oyster, MD, [facility] Physical Medicine & Rehabilitation 07/07/2018  Stephania Fragmin 07/07/2018        Revision History

## 2018-07-07 NOTE — Progress Notes (Signed)
Pt to be transported to  [facility] via [facility]. A/o no resp distress sl intact. Report given to andrea from [facility].

## 2018-07-07 NOTE — H&P (Signed)
Physical Medicine and Rehabilitation Admission H&P        Chief Complaint  Patient presents with  . Weakness    Chief complaint: dizziness   HPI: Kenneth Morales is a 53 year old right-handed male with history of CAD status post PCI maintained on Brilinta/aspirin present but noncompliant, tobacco/polysubstance abuse, hypertension maintained on Norvasc 5 mg twice daily, Lopressor 25 mg twice a day. Per chart review patient lives with 27 year old daughter. Two-level home. Independent prior to admission.Presented 07/04/2018 with dyspnea, nausea and intermittent shortness of breath as well as numerous falls. He was recently seen in the ED for a viral illness placed on antibiotic therapy and discharged home. CT of the head showed right cerebellar infarction likely subacute no hemorrhage. Patient did not receive TPA.Urine drug screen positive for cocaine. CT angiogram of head and neck with no large vessel occlusion. 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 No mass effect. Echocardiogram with ejection fraction of 65% hyperdynamic systolic function. Neurology follow-up currently on aspirin and Plavix for CVA prophylaxis. Patient's Brilinta has been discontinued. Subcutaneous Lovenox for DVT prophylaxis. Tolerating a regular diet. Therapy evaluations completed with recommendations of physical medicine rehabilitation consult patient was admitted for a comprehensive rehabilitation program.   Review of Systems  Constitutional: Negative for chills and fever.  HENT: Negative for hearing loss.   Eyes: Negative for double vision.  Respiratory: Positive for shortness of breath. Negative for cough.   Cardiovascular: Positive for chest pain. Negative for palpitations and leg swelling.  Gastrointestinal: Positive for nausea and vomiting.  Genitourinary: Negative for dysuria, flank pain and hematuria.   Musculoskeletal: Positive for falls and myalgias.  Skin: Negative for rash.  Neurological: Positive for dizziness, weakness and headaches. Negative for seizures.  Psychiatric/Behavioral: Positive for depression. The patient has insomnia.   All other systems reviewed and are negative.       Past Medical History:  Diagnosis Date  . Acute MI (HCC) MAY 2013  . CAD (coronary artery disease)      NSTEMI in 05/13 in setting of Cocaine use. Cath: 99% mid LCX stenosis with a large thrombus. PCI and BMS (4.0 X15 mm Vision) placement to mid LCX, LAD: 20%, RCA: 30%, EF: 60%.   . Cocaine abuse (HCC)      quit in 08/2011  . Depression    . Hyperlipidemia    . Hypertension    . Spider bite    . Tobacco abuse           Past Surgical History:  Procedure Laterality Date  . CARDIAC CATHETERIZATION   MAY 2013    s/p stent @ [facility]  . CARDIAC CATHETERIZATION      . CORONARY CTO INTERVENTION N/A 01/27/2018    Procedure: CORONARY CTO INTERVENTION;  Surgeon: Corky Crafts, MD;  Location: [facility] INVASIVE CV LAB;  Service: Cardiovascular;  Laterality: N/A;  . CORONARY STENT INTERVENTION N/A 11/29/2017    Procedure: CORONARY STENT INTERVENTION;  Surgeon: Alwyn Pea, MD;  Location: [facility] INVASIVE CV LAB;  Service: Cardiovascular;  Laterality: N/A;  . CORONARY STENT INTERVENTION N/A 01/27/2018    Procedure: CORONARY STENT INTERVENTION;  Surgeon: Corky Crafts, MD;  Location: [facility] INVASIVE CV LAB;  Service: Cardiovascular;  Laterality: N/A;  mid cfx  . HEMORRHOID SURGERY      . LEFT HEART CATH AND CORONARY ANGIOGRAPHY Right 11/29/2017    Procedure: Left heart  catheterization with possible PCI;  Surgeon: Laurier Nancy, MD;  Location: [facility] INVASIVE CV LAB;  Service: Cardiovascular;  Laterality: Right;  . LEFT HEART CATH AND CORONARY ANGIOGRAPHY N/A 01/27/2018    Procedure: LEFT HEART CATH AND CORONARY ANGIOGRAPHY;  Surgeon: Corky Crafts, MD;  Location: [facility] INVASIVE CV LAB;  Service:  Cardiovascular;  Laterality: N/A;         Family History  Problem Relation Age of Onset  . Heart attack Maternal Grandfather    . Heart attack Paternal Grandfather      Social History:  reports that he has been smoking cigarettes. He has a 15.00 pack-year smoking history. He has never used smokeless tobacco. He reports current alcohol use of about 6.0 standard drinks of alcohol per week. He reports that he does not use drugs. Allergies:       Allergies  Allergen Reactions  . Brilinta [Ticagrelor] Shortness Of Breath  . Penicillins Rash      Has patient had a PCN reaction causing immediate rash, facial/tongue/throat swelling, SOB or lightheadedness with hypotension: Yes Has patient had a PCN reaction causing severe rash involving mucus membranes or skin necrosis: No Has patient had a PCN reaction that required hospitalization: No Has patient had a PCN reaction occurring within the last 10 years: No If all of the above answers are "NO", then may proceed with Cephalosporin use.          Medications Prior to Admission  Medication Sig Dispense Refill  . amLODipine (NORVASC) 5 MG tablet Take 1 tablet (5 mg total) by mouth daily. 30 tablet 5  . aspirin 81 MG tablet Take 1 tablet (81 mg total) by mouth daily. 30 tablet 2  . atorvastatin (LIPITOR) 80 MG tablet Take 1 tablet (80 mg total) by mouth daily at 6 PM. 30 tablet 5  . metoprolol tartrate (LOPRESSOR) 25 MG tablet Take 25 mg by mouth 2 (two) times daily.      . nitroGLYCERIN (NITROSTAT) 0.4 MG SL tablet Place 0.4 mg under the tongue as directed. Place 1 tablet (0.4mg  total) under the tongue every 5 minutes as needed for chest pain. May take 3 doses.      Marland Kitchen omeprazole (PRILOSEC) 20 MG capsule Take 20 mg by mouth daily.      . ranolazine (RANEXA) 500 MG 12 hr tablet Take 500 mg by mouth 2 (two) times daily.      . ticagrelor (BRILINTA) 90 MG TABS tablet Take 1 tablet (90 mg total) by mouth every 12 (twelve) hours. 60 tablet 5      Drug  Regimen Review Drugregimen was reviewed and remains appropriate with no significant issues identified   Home: Home Living Family/patient expects to be discharged to:: Private residence Living Arrangements: Children(single-parent of 102 year old daughter) Available Help at Discharge: Family, Available PRN/intermittently Type of Home: Apartment Home Access: Level entry Home Layout: Two level Alternate Level Stairs-Number of Steps: Pt could not live on main level. All bedrooms and bathrooms are on second level. Alternate Level Stairs-Rails: Left Bathroom Shower/Tub: Tub/shower unit, Engineer, building services: Standard Home Equipment: None   Functional History: Prior Function Level of Independence: Independent Comments: Indep with ADLs, household and community distances without assist device; + driving; single-parent to 79 year old daughter   Functional Status:  Mobility: Bed Mobility Overal bed mobility: Modified Independent General bed mobility comments: improved midline positoning with initial transition to upright this date; wedge under R IT for sustained sitting edge of bed Transfers  Overall transfer level: Needs assistance Equipment used: 2 person hand held assist Transfers: Sit to/from Stand Sit to Stand: Min assist, +2 physical assistance General transfer comment: educated in proper foot placement/alignment prior to standing attempts; encouraged to visually check placement prior to loading Ambulation/Gait Ambulation/Gait assistance: Mod assist, +2 physical assistance Gait Distance (Feet): 40 Feet Assistive device: 2 person hand held assist General Gait Details: improved R foot placement with visual cuing/feedback (maintaining placement on line on floor); step by step cuing for gait sequencing and midline correction.  Mod manual cuing for L ant/lateral weight shift; improved efforts/awareness of R TKE throughout gait cycle this date   ADL: ADL Overall ADL's : Needs  assistance/impaired Eating/Feeding: Set up, Minimal assistance, Sitting Eating/Feeding Details (indicate cue type and reason): Pt indicates he has had difficulty loading his spoon unsure if d/t double vision or sensation loss in hands.  Grooming: Set up, Supervision/safety, Sitting Grooming Details (indicate cue type and reason): with wedge under R IT for improved optimal midline posture while sitting, pt able to brush teeth and shave face with set up, supervision, and additional time to perform and for accuracy Upper Body Bathing: Set up, Sitting, Cueing for sequencing, Minimal assistance, Cueing for safety Lower Body Bathing: Set up, Moderate assistance, Sit to/from stand, With adaptive equipment, Cueing for safety, Cueing for sequencing, +2 for safety/equipment Upper Body Dressing : Set up, Minimal assistance, Sitting, Cueing for sequencing Lower Body Dressing: Set up, Moderate assistance, +2 for safety/equipment, With adaptive equipment, Sit to/from stand Toilet Transfer: Set up, BSC, Stand-pivot, +2 for safety/equipment, Cueing for sequencing, RW Toileting- Clothing Manipulation and Hygiene: Minimal assistance, Set up, Sit to/from stand, +2 for safety/equipment, With adaptive equipment Functional mobility during ADLs: Moderate assistance, +2 for physical assistance, +2 for safety/equipment General ADL Comments: Pt very unstable on feet and loses balance easily. Requies +2 for safety/assistance with functional mobility and sts transfers.    Cognition: Cognition Overall Cognitive Status: Within Functional Limits for tasks assessed Orientation Level: Oriented X4 Cognition Arousal/Alertness: Awake/alert Behavior During Therapy: WFL for tasks assessed/performed Overall Cognitive Status: Within Functional Limits for tasks assessed General Comments: limited insight into severity of deficits   Physical Exam: Blood pressure (!) 140/99, pulse 70, temperature 98.2 F (36.8 C), temperature source  Oral, resp. rate 20, height 5\' 10"  (1.778 m), weight 68.3 kg, SpO2 98 %. Physical Exam  Constitutional: He is oriented to person, place, and time. He appears well-developed and well-nourished.  HENT:  Head: Normocephalic and atraumatic.  Eyes: Pupils are equal, round, and reactive to light. EOM are normal.  Neck: Normal range of motion. No tracheal deviation present. No thyromegaly present.  Cardiovascular: Normal rate and regular rhythm. Exam reveals no gallop and no friction rub.  No murmur heard. Respiratory: Effort normal. No respiratory distress. He has no wheezes. He has no rales.  GI: Soft. He exhibits no distension. There is no abdominal tenderness. There is no rebound.  Musculoskeletal: Normal range of motion.        General: No deformity or edema.  Neurological: He is alert and oriented to person, place, and time.  Pt with reasonable insight and awareness. Normal memory. Language normal. Speech clear. Mild diplopia in all fields, gaze does not appear grossly dysconjugate. No nystagmus. No gross limb ataxia. Notes decreased sensation to palm and fingers of left hand, slight tingling fingers right hand. Motor 4+/5 in all 4 limbs proximal to distal. Began to hiccup at beginning of the exam.   Skin: Skin is  warm.  Psychiatric: He has a normal mood and affect. His behavior is normal. Judgment and thought content normal.      Lab Results Last 48 Hours        Results for orders placed or performed during the hospital encounter of 07/04/18 (from the past 48 hour(s))  Basic metabolic panel     Status: Abnormal    Collection Time: 07/06/18  5:50 AM  Result Value Ref Range    Sodium 141 135 - 145 mmol/L    Potassium 3.7 3.5 - 5.1 mmol/L    Chloride 107 98 - 111 mmol/L    CO2 26 22 - 32 mmol/L    Glucose, Bld 132 (H) 70 - 99 mg/dL    BUN 21 (H) 6 - 20 mg/dL    Creatinine, Ser 1.61 0.61 - 1.24 mg/dL    Calcium 9.4 8.9 - 09.6 mg/dL    GFR calc non Af Amer >60 >60 mL/min    GFR calc Af  Amer >60 >60 mL/min    Anion gap 8 5 - 15      Comment: Performed at [facility], [address] [place], [place] [address]  Magnesium     Status: Abnormal    Collection Time: 07/06/18  5:50 AM  Result Value Ref Range    Magnesium 2.5 (H) 1.7 - 2.4 mg/dL      Comment: Performed at [facility], [address], [place], [place] [address]      Imaging Results (Last 48 hours)  No results found.           Medical Problem List and Plan: 1.  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 Pt with ongoing diplopia, hiccups, balance and coordination deficits.             -admit to inpatient rehab             -add prn baclofen for hiccups 2.  Antithrombotics: -DVT/anticoagulation:  Subcutaneous Lovenox             -antiplatelet therapy: aspirin 81 mg daily, Plavix 75 mg daily 3. Pain Management:  Tylenol as needed 4. Mood:  Provide emotional support             -antipsychotic agents: None 5. Neuropsych: This patient is capable of making decisions on his own behalf. 6. Skin/Wound Care:  Routine skin checks 7. Fluids/Electrolytes/Nutrition:  Routine ins and outs with follow-up chemistries 8. Lopressor 25 mg twice a day, Norvasc 5 mg daily. Monitor with increased mobility 9. History of CAD with myocardial infarction/stenting. Continue aspirin and Plavix. Patient was noncompliant with Brilinta. Continue Ranexa 500 mg twice a day 10. Hyperlipidemia. Lipitor 11. History of tobacco polysubstance abuse. Urine drug screen positive for cocaine. Provide counseling 12. Questionable medical noncompliance. Provide counseling 13. Constipation. Laxative assistance, augment regimen as needed           Lynnae Prude 07/07/2018  Post Admission Physician Evaluation: 1. Functional deficits secondary  to right PICA infarct. 2. Patient is admitted to receive collaborative,  interdisciplinary care between the physiatrist, rehab nursing staff, and therapy team. 3. Patient's level of medical complexity and substantial therapy needs in context of that medical necessity cannot be provided at a lesser intensity of care such as a SNF. 4. Patient has experienced substantial functional loss from his/her baseline which was documented above under the "Functional History" and "Functional Status" headings.  Judging  by the patient's diagnosis, physical exam, and functional history, the patient has potential for functional progress which will result in measurable gains while on inpatient rehab.  These gains will be of substantial and practical use upon discharge  in facilitating mobility and self-care at the household level. 5. Physiatrist will provide 24 hour management of medical needs as well as oversight of the therapy plan/treatment and provide guidance as appropriate regarding the interaction of the two. 6. The Preadmission Screening has been reviewed and patient status is unchanged unless otherwise stated above. 7. 24 hour rehab nursing will assist with bladder management, bowel management, safety, skin/wound care, disease management, medication administration, pain management and patient education  and help integrate therapy concepts, techniques,education, etc. 8. PT will assess and treat for/with: Lower extremity strength, range of motion, stamina, balance, functional mobility, safety, adaptive techniques and equipment, NMR, vestibular rx, community reentry.   Goals are: mod I to supervision. 9. OT will assess and treat for/with: ADL's, functional mobility, safety, upper extremity strength, adaptive techniques and equipment, NMR, vestibular rx, community reentry.   Goals are: mod I to supervision. Therapy may proceed with showering this patient. 10. Case Management and Social Worker will assess and treat for psychological issues and discharge planning. 11. Team conference will be  held weekly to assess progress toward goals and to determine barriers to discharge. 12. Patient will receive at least 3 hours of therapy per day at least 5 days per week. 13. ELOS: 12-18 days       14. Prognosis:  excellent   I have personally performed a face to face diagnostic evaluation of this patient and formulated the key components of the plan.  Additionally, I have personally reviewed laboratory data, imaging studies, as well as relevant notes and concur with the physician assistant's documentation above.  Ranelle OysterZachary T. Kajsa Butrum, MD, [place] DomFAAPMR

## 2018-07-07 NOTE — Discharge Summary (Signed)
[employer] - [place] at [facility]   PATIENT NAME: Kenneth Morales    MR#:  [id number]  DATE OF BIRTH:  Oct 12, 1965  DATE OF ADMISSION:  07/04/2018   ADMITTING PHYSICIAN: Adrian Saran, MD  DATE OF DISCHARGE: 07/07/2018  PRIMARY CARE PHYSICIAN: [facility]   ADMISSION DIAGNOSIS:  Ataxia [R27.0] Stroke (cerebrum) (HCC) [I63.9] Cerebrovascular accident (CVA), unspecified mechanism (HCC) [I63.9] DISCHARGE DIAGNOSIS:  Active Problems:   Stroke (cerebrum) (HCC)  SECONDARY DIAGNOSIS:   Past Medical History:  Diagnosis Date  . Acute MI (HCC) MAY 2013  . CAD (coronary artery disease)    NSTEMI in 05/13 in setting of Cocaine use. Cath: 99% mid LCX stenosis with a large thrombus. PCI and BMS (4.0 X15 mm Vision) placement to mid LCX, LAD: 20%, RCA: 30%, EF: 60%.   . Cocaine abuse (HCC)    quit in 08/2011  . Depression   . Hyperlipidemia   . Hypertension   . Spider bite   . Tobacco abuse    HOSPITAL COURSE:  Chief complaints; dizziness and falls   History of presenting complaint; Kenneth Morales  is a 53 y.o. male with a known history of CAD status post PCI not compliant with Brilinta due to feeling shortness of breath, essential hypertension and tobacco dependence who presents to the emergency room due to above complaint.  Patient was evaluated in the emergency room a few days ago for nausea and dizziness.  He was discharged home.  Patient reports since that time he has had increasing dizziness and numerous falls. CT showed subacute cerebellar infarct.  Please refer to the H&P dictated for further details   Hospital course; 1.  Acute right sided cerebellar infarcts Patient presented with dizziness with evidence of nystagmus on exam on presentation as well as falls.  Secondary to CVA.Marland KitchenMRI of the brain done revealed multifocal acute ischemic infarcts of the right cerebellum, CTA head and neck done revealed no emergent large vessel occlusion  or hemodynamically significant stenosis of the arteries of the head and neck.  2D echocardiogram done with ejection fraction of 65%.  No gross valvular abnormalities/vegetation noted.  Seen by speech therapist.  No further skilled speech therapy needs recommended.  Seen by physical therapist.  Inpatient rehab recommended. Reevaluated by neurologist.  Patient was on aspirin and Brilinta prior to admission. From previous cardiology note due to patient symptoms or shortness of breath they were considering changing from Brilinta to Plavix however this has not been done according to the patient).  Brilinta discontinued and Plavix added to aspirin as recommended by neurologist.  Lipid panel done with LDL of 109.  Continue high intensity statins Appreciate neurology input  2. History of CAD status post PCI:  Continue aspirin and Plavix  Continue metoprolol Continue Ranexa and statins  3.Tobacco dependence: Patient is encouraged to quit smoking and willing to attempt to quit was assessed. Patient highly motivated.Counseling was provided for 4 minutes.  4. Hypertension: Allow for permissive hypertension due to recent CVA.  Blood pressure fairly controlled on current regimen   5. Elevated troponin: Mildly elevated at 0.03 but stable.  Patient asymptomatic.  Outpatient follow-up with his cardiologist on discharge after resolution of ongoing neurological symptoms  6.  Cocaine abuse Urine drug screen positive for cocaine.  Counseling done.  Patient motivated to quit on discharge   DISCHARGE CONDITIONS:  Patient clinically and hemodynamically stable.  Being discharged to acute inpatient rehab. CONSULTS OBTAINED:  Treatment Team:  Kym Groom,  MD DRUG ALLERGIES:   Allergies  Allergen Reactions  . Brilinta [Ticagrelor] Shortness Of Breath  . Penicillins Rash    Has patient had a PCN reaction causing immediate rash, facial/tongue/throat swelling, SOB or lightheadedness with  hypotension: Yes Has patient had a PCN reaction causing severe rash involving mucus membranes or skin necrosis: No Has patient had a PCN reaction that required hospitalization: No Has patient had a PCN reaction occurring within the last 10 years: No If all of the above answers are "NO", then may proceed with Cephalosporin use.   DISCHARGE MEDICATIONS:   Allergies as of 07/07/2018      Reactions   Brilinta [ticagrelor] Shortness Of Breath   Penicillins Rash   Has patient had a PCN reaction causing immediate rash, facial/tongue/throat swelling, SOB or lightheadedness with hypotension: Yes Has patient had a PCN reaction causing severe rash involving mucus membranes or skin necrosis: No Has patient had a PCN reaction that required hospitalization: No Has patient had a PCN reaction occurring within the last 10 years: No If all of the above answers are "NO", then may proceed with Cephalosporin use.      Medication List    STOP taking these medications   ticagrelor 90 MG Tabs tablet Commonly known as:  BRILINTA     TAKE these medications   amLODipine 5 MG tablet Commonly known as:  NORVASC Take 1 tablet (5 mg total) by mouth daily.   aspirin 81 MG tablet Take 1 tablet (81 mg total) by mouth daily.   atorvastatin 80 MG tablet Commonly known as:  LIPITOR Take 1 tablet (80 mg total) by mouth daily at 6 PM.   clopidogrel 75 MG tablet Commonly known as:  PLAVIX Take 1 tablet (75 mg total) by mouth daily. Start taking on:  July 08, 2018   metoprolol tartrate 25 MG tablet Commonly known as:  LOPRESSOR Take 25 mg by mouth 2 (two) times daily.   nitroGLYCERIN 0.4 MG SL tablet Commonly known as:  NITROSTAT Place 0.4 mg under the tongue as directed. Place 1 tablet (0.4mg  total) under the tongue every 5 minutes as needed for chest pain. May take 3 doses.   omeprazole 20 MG capsule Commonly known as:  PRILOSEC Take 20 mg by mouth daily.   polyethylene glycol packet Commonly known  as:  MIRALAX / GLYCOLAX Take 17 g by mouth daily. Start taking on:  July 08, 2018   ranolazine 500 MG 12 hr tablet Commonly known as:  RANEXA Take 500 mg by mouth 2 (two) times daily.   senna-docusate 8.6-50 MG tablet Commonly known as:  Senokot-S Take 1 tablet by mouth at bedtime as needed for mild constipation.        DISCHARGE INSTRUCTIONS:   DIET:  Cardiac diet DISCHARGE CONDITION:  Stable ACTIVITY:  Activity as tolerated OXYGEN:  Home Oxygen: No.  Oxygen Delivery: room air DISCHARGE LOCATION:  Inpatient rehab  If you experience worsening of your admission symptoms, develop shortness of breath, life threatening emergency, suicidal or homicidal thoughts you must seek medical attention immediately by calling 911 or calling your MD immediately  if symptoms less severe.  You Must read complete instructions/literature along with all the possible adverse reactions/side effects for all the Medicines you take and that have been prescribed to you. Take any new Medicines after you have completely understood and accpet all the possible adverse reactions/side effects.   Please note  You were cared for by a hospitalist during your hospital stay. If you  have any questions about your discharge medications or the care you received while you were in the hospital after you are discharged, you can call the unit and asked to speak with the hospitalist on call if the hospitalist that took care of you is not available. Once you are discharged, your primary care physician will handle any further medical issues. Please note that NO REFILLS for any discharge medications will be authorized once you are discharged, as it is imperative that you return to your primary care physician (or establish a relationship with a primary care physician if you do not have one) for your aftercare needs so that they can reassess your need for medications and monitor your lab values.    On the day of Discharge:   VITAL SIGNS:  Blood pressure (!) 140/99, pulse 70, temperature 98.2 F (36.8 C), temperature source Oral, resp. rate 20, height 5\' 10"  (1.778 m), weight 68.3 kg, SpO2 98 %. PHYSICAL EXAMINATION:  GENERAL:  53 y.o.-year-old patient lying in the bed with no acute distress.  EYES: Pupils equal, round, reactive to light and accommodation. No scleral icterus. Extraocular muscles intact.  HEENT: Head atraumatic, normocephalic. Oropharynx and nasopharynx clear.  NECK:  Supple, no jugular venous distention. No thyroid enlargement, no tenderness.  LUNGS: Normal breath sounds bilaterally, no wheezing, rales,rhonchi or crepitation. No use of accessory muscles of respiration.  CARDIOVASCULAR: S1, S2 normal. No murmurs, rubs, or gallops.  ABDOMEN: Soft, non-tender, non-distended. Bowel sounds present. No organomegaly or mass.  EXTREMITIES: No pedal edema, cyanosis, or clubbing.  NEUROLOGIC: Cranial nerves II through XII are intact. Muscle strength 5/5 in all extremities. Sensation intact. Gait not checked.  PSYCHIATRIC: The patient is alert and oriented x 3.  SKIN: No obvious rash, lesion, or ulcer.  DATA REVIEW:   CBC Recent Labs  Lab 07/04/18 1309  WBC 7.6  HGB 15.4  HCT 44.6  PLT 245    Chemistries  Recent Labs  Lab 07/04/18 1309 07/06/18 0550  NA 137 141  K 3.7 3.7  CL 108 107  CO2 23 26  GLUCOSE 108* 132*  BUN 14 21*  CREATININE 0.89 0.85  CALCIUM 8.9 9.4  MG  --  2.5*  AST 24  --   ALT 16  --   ALKPHOS 66  --   BILITOT 0.8  --      Microbiology Results  Results for orders placed or performed during the hospital encounter of 01/26/18  MRSA PCR Screening     Status: None   Collection Time: 01/26/18  9:07 PM  Result Value Ref Range Status   MRSA by PCR NEGATIVE NEGATIVE Final    Comment:        The GeneXpert MRSA Assay (FDA approved for NASAL specimens only), is one component of a comprehensive MRSA colonization surveillance program. It is not intended to diagnose  MRSA infection nor to guide or monitor treatment for MRSA infections. Performed at [facility], [address] [place], [place] [address]     RADIOLOGY:  No results found.   Management plans discussed with the patient, family and they are in agreement.  CODE STATUS: Full Code   TOTAL TIME TAKING CARE OF THIS PATIENT: 36 minutes.    Suvan Stcyr M.D on 07/07/2018 at 11:02 AM  Between 7am to 6pm - Pager - [phone]  After 6pm go to [email or web address] - Scientist, research (life [email or web address])password [email or web address]  [facility]  Hospitalists  Office  [phone]  CC: Primary care physician; [facility]  [employer]   Note: This dictation was prepared with Office manager along with smaller Lobbyist. Any transcriptional errors that result from this process are unintentional.

## 2018-07-07 NOTE — Plan of Care (Signed)
  Problem: Education: Goal: Knowledge of General Education information will improve Description Including pain rating scale, medication(s)/side effects and non-pharmacologic comfort measures Outcome: Progressing   Problem: Activity: Goal: Risk for activity intolerance will decrease Outcome: Progressing   Problem: Safety: Goal: Ability to remain free from injury will improve Outcome: Progressing   Problem: Education: Goal: Knowledge of disease or condition will improve Outcome: Progressing Goal: Knowledge of secondary prevention will improve Outcome: Progressing Goal: Knowledge of patient specific risk factors addressed and post discharge goals established will improve Outcome: Progressing   Problem: Ischemic Stroke/TIA Tissue Perfusion: Goal: Complications of ischemic stroke/TIA will be minimized Outcome: Progressing

## 2018-07-07 NOTE — Progress Notes (Signed)
Patient arrived via ambulance with [facility] team. Patient had a good ride from [facility]. No complaints of pain and resting comfortably in bed waiting on lunch.

## 2018-07-07 NOTE — Progress Notes (Signed)
[facility] called for transport. 

## 2018-07-07 NOTE — Progress Notes (Addendum)
Inpatient Rehab Admissions:  Inpatient Rehab Consult received.  I spoke with patient and his parents over the phone for rehabilitation assessment and to discuss goals and expectations of an inpatient rehab admission.  Pt and family interested in an CIR stay, will plan to admit when medically ready, possibly today.   Addendum: I have MD clearance from Dr. Enid Baas for admission today.  I've spoken with Case Manager, Steward Drone, and she will arrange transportation for rehab admit today.   Signed: Estill Dooms, PT, DPT Admissions Coordinator [phone] 07/07/18  8:49 AM

## 2018-07-08 ENCOUNTER — Inpatient Hospital Stay (HOSPITAL_COMMUNITY): Payer: Medicaid Other | Admitting: Occupational Therapy

## 2018-07-08 ENCOUNTER — Inpatient Hospital Stay (HOSPITAL_COMMUNITY): Payer: Medicaid Other | Admitting: Physical Therapy

## 2018-07-08 MED ORDER — BACLOFEN 10 MG PO TABS
10.0000 mg | ORAL_TABLET | Freq: Three times a day (TID) | ORAL | Status: DC | PRN
  Administered 2018-07-08 – 2018-07-09 (×2): 10 mg via ORAL
  Filled 2018-07-08 (×2): qty 1

## 2018-07-08 NOTE — Progress Notes (Signed)
Physical Therapy Session Note  Patient Details  Name: Kenneth Morales MRN: [id number] Date of Birth: 1965/07/22  Today's Date: 07/08/2018 PT Individual Time: 1320-1400 PT Individual Time Calculation (min): 40 min   Short Term Goals: Week 1:  PT Short Term Goal 1 (Week 1): Pt will ambulate 17' w/ CGA w/ LRAD PT Short Term Goal 2 (Week 1): Pt will negotiate 17 steps w/ CGA w/ L rail only as per home set-up PT Short Term Goal 3 (Week 1): Pt will maintain dynamic standing balance w/ min assist w/o UE support  Skilled Therapeutic Interventions/Progress Updates:   Missed 20 min of skilled PT at beginning of session 2/2 eating lunch. Agreeable to therapy and no c/o pain. Ambulated to day room w/ min assist using RW. Worked on gait training w/o AD this session. Ambulated 100' x3 w/o AD and min assist, much improved since morning session. Verbal cues for gait pattern and min manual facilitation of weight shifting, mod assist to correct 1 LOB. Rest breaks in between gait 2/2 mild dizziness w/ prolonged upright mobility, resolves quickly w/ rest, denies feeling lightheaded. Standing and seated rest breaks at high low table while playing keyboard, pt enjoys playing piano in his free time. CGA for standing at high/low table w/o UE support. Ambulated back to room w/o AD, min assist. Pt requesting exercises he can do w/ UEs when not in therapy to help w/ strengthening and coordination. Provided w/ blue theraband and instructed on horizontal adduction, tricep pulls, serratus punch, low row, and mid row. Pt returned demonstration safely and w/o pain. Ended session in supine, all needs in reach.   Therapy Documentation Precautions:  Precautions Precautions: Fall Restrictions Weight Bearing Restrictions: No  Therapy/Group: Individual Therapy  Zavior Thomason Melton Krebs 07/08/2018, 3:31 PM

## 2018-07-08 NOTE — Evaluation (Signed)
Occupational Therapy Assessment and Plan  Patient Details  Name: Kenneth Morales MRN: [id number] Date of Birth: [birth year]  OT Diagnosis: abnormal posture, ataxia, disturbance of vision, hemiplegia affecting non-dominant side and muscle weakness (generalized)  Rehab Potential: Rehab Potential (ACUTE ONLY): Good ELOS: 12-14 days   Today's Date: 07/08/2018 OT Individual Time: 4098-1191 OT Individual Time Calculation (min): 63 min     Problem List:  Patient Active Problem List   Diagnosis Date Noted  . 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 Stroke (cerebrum) ([place]) 07/04/2018  . Unstable angina ([place]) 01/26/2018  . NSTEMI (non-ST elevated myocardial infarction) ([place]) 11/28/2017  . 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 Chest pain 07/01/2016  . SOB (shortness of breath) 12/10/2011  . CAD (coronary artery disease)   . Hyperlipidemia   . Hypertension   . Tobacco abuse   . Cocaine abuse [facility])     Past Medical History:  Past Medical History:  Diagnosis Date  . Acute MI ([place]) MAY 2013  . CAD (coronary artery disease)    NSTEMI in 05/13 in setting of Cocaine use. Cath: 99% mid LCX stenosis with a large thrombus. PCI and BMS (4.0 X15 mm Vision) placement to mid LCX, LAD: 20%, RCA: 30%, EF: 60%.   . Cocaine abuse ([place])    quit in 08/2011  . Depression   . Hyperlipidemia   . Hypertension   . Spider bite   . Tobacco abuse    Past Surgical History:  Past Surgical History:  Procedure Laterality Date  . CARDIAC CATHETERIZATION  MAY 2013   s/p stent @ [facility]  . CARDIAC CATHETERIZATION    . CORONARY CTO INTERVENTION N/A 01/27/2018   Procedure: CORONARY CTO INTERVENTION;  Surgeon: Jettie Booze, MD;  Location: [facility];  Service: Cardiovascular;  Laterality: N/A;  . CORONARY STENT INTERVENTION N/A 11/29/2017   Procedure: CORONARY STENT INTERVENTION;  Surgeon: Yolonda Kida, MD;  Location: [place] [facility];  Service: Cardiovascular;  Laterality: N/A;  .  CORONARY STENT INTERVENTION N/A 01/27/2018   Procedure: CORONARY STENT INTERVENTION;  Surgeon: Jettie Booze, MD;  Location: [place] [facility];  Service: Cardiovascular;  Laterality: N/A;  mid cfx  . HEMORRHOID SURGERY    . LEFT HEART CATH AND CORONARY ANGIOGRAPHY Right 11/29/2017   Procedure: Left heart catheterization with possible PCI;  Surgeon: Dionisio David, MD;  Location: [place] [facility];  Service: Cardiovascular;  Laterality: Right;  . LEFT HEART CATH AND CORONARY ANGIOGRAPHY N/A 01/27/2018   Procedure: LEFT HEART CATH AND CORONARY ANGIOGRAPHY;  Surgeon: Jettie Booze, MD;  Location: [facility];  Service: Cardiovascular;  Laterality: N/A;    Assessment & Plan Clinical Impression:  Kenneth Morales is a 53 year old right-handed male with history of CAD status post PCI maintained on Brilinta/aspirin present but noncompliant, tobacco/polysubstance abuse, hypertension maintained on Norvasc 5 mg twice daily, Lopressor 25 mg twice a day. Per chart review patient lives with 25 year old daughter. Two-level home. Independent prior to admission.Presented 07/04/2018 with dyspnea, nausea and intermittent shortness of breath as well as numerous falls. He was recently seen in the ED for a viral illness placed on antibiotic therapy and discharged home. CT of the head showed right cerebellar infarction likely subacute no hemorrhage. Patient did not receive TPA.Urine drug screen positive for cocaine. CT angiogram of head and neck with no large vessel occlusion. 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 No mass effect. Echocardiogram with ejection fraction of 65% hyperdynamic systolic function. Neurology follow-up currently on aspirin and Plavix for CVA prophylaxis. Patient's Brilinta has been discontinued. Subcutaneous Lovenox for DVT prophylaxis. Tolerating a regular diet. Therapy  evaluations completed with recommendations of physical medicine rehabilitation consult patient was admitted for a comprehensive rehabilitation program.  Patient currently requires min with basic self-care skills secondary to muscle weakness, decreased cardiorespiratoy endurance, unbalanced muscle activation, ataxia, decreased coordination and abnormal sensation, diplopia + blurriness and decreased standing balance, decreased postural control and decreased balance strategies.  Prior to hospitalization, patient could complete BADLs with independent .  Patient will benefit from skilled intervention to increase independence with basic self-care skills prior to discharge home with family support.  Anticipate patient will require intermittent supervision and follow up home health.  OT - End of Session Endurance Deficit: Yes Endurance Deficit Description: decreased OT Assessment Rehab Potential (ACUTE ONLY): Good OT Barriers to Discharge: Home environment access/layout;Medical stability(17 steps to 2nd level) OT Patient demonstrates impairments in the following area(s): Balance;Vision;Behavior;Safety;Endurance;Sensory;Motor OT Basic ADL's Functional Problem(s): Grooming;Bathing;Dressing;Toileting OT Advanced ADL's Functional Problem(s): Simple Meal Preparation;Light Housekeeping OT Transfers Functional Problem(s): Toilet;Tub/Shower OT Additional Impairment(s): None OT Plan OT Intensity: Minimum of 1-2 x/day, 45 to 90 minutes OT Frequency: 5 out of 7 days OT Duration/Estimated Length of Stay: 12-14 days OT Treatment/Interventions: Balance/vestibular training;Community reintegration;Disease mangement/prevention;Neuromuscular re-education;Patient/family education;Self Care/advanced ADL retraining;Therapeutic Exercise;UE/LE Coordination activities;Wheelchair propulsion/positioning;Visual/perceptual remediation/compensation;UE/LE Strength taining/ROM;Therapeutic Activities;Psychosocial support;Pain  management;Functional mobility training;DME/adaptive equipment instruction;Discharge planning OT Self Feeding Anticipated Outcome(s): No goal OT Basic Self-Care Anticipated Outcome(s): Supervision/setup bathing, Mod I ADLs OT Toileting Anticipated Outcome(s): Mod I  OT Bathroom Transfers Anticipated Outcome(s): Mod I  OT Recommendation Patient destination: Home Follow Up Recommendations: Home health OT Equipment Recommended: Tub/shower bench;To be determined  Skilled Therapeutic Intervention Skilled OT session completed with focus on initial evaluation, education on OT role/POC, and establishment of patient-centered goals.   Pt declined participating in bathing or dressing tasks during session, but agreeable to practice functional transfers. Pt used device to ambulate into bathroom for toilet and shower transfers with Min A. He required max vcs for walker safety as he'd try to abandon device when near grab bars. Noted Rt lean in standing. Pt engaged in simulated showering tasks sit<stand from shower chair with supervision-steady assist using grab bar. He then stood at the sink to brush teeth and wash hands with vcs for DME mgt. Pt requested to play the piano, something he's been eager to do since hospital admission. Escorted pt via w/c to atrium and he completed stand pivot<piano bench with steady assist. Pt played a few songs by memory, often smiling. A few people came over to watch him. He was pleased that his hand coordination was good enough to still play well. Per pt, his biggest UE related c/o are sensation abnormalities as detailed below. Escorted pt via w/c to tub shower room and we reviewed shower transfers to TTB using RW. Discussed placement of curtain, shower treads, and installing grab bars at home. Pt appeared receptive to education, reported he will have family supervise during showers at home for safety. Max vcs for hand placement during sit<stand transitions and safe walker placement.  Sit<stand at scale for weight retrieval completed with Min A as well. At end of session pt returned to room, ambulated without device to recliner with steady assist, but increased LE ataxia and Rt lean. Pt left in recliner with all needs and safety belt fastened.  OT Evaluation Precautions/Restrictions  Precautions Precautions: Fall Restrictions Weight Bearing Restrictions: No General Chart Reviewed: Yes Family/Caregiver Present: No Pain: pt denied pain during session    Home Living/Prior [place] expects to be discharged to:: Private residence Living Arrangements: Alone Available Help at Discharge: Available 24 hours/day(Per pt, supervision will be divided between parents, and cousin) Type of Home: Apartment Home Access: Level entry Home Layout: Two level Alternate Level Stairs-Number of Steps: Pt could not live on main level. All bedrooms and bathrooms are on second level of apartment.(17 steps to 2nd level. 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 Driving daughter to school every day  Prior Function Level of Independence: Independent with basic ADLs, Independent with homemaking with ambulation, Independent with transfers, Independent with gait  Able to Take Stairs?: Yes Driving:  Yes ADL ADL Eating: Not assessed Grooming: Contact guard Where Assessed-Grooming: Standing at sink Upper Body Bathing: Supervision/safety(simulated) Where Assessed-Upper Body Bathing: Shower Lower Body Bathing: Contact guard Where Assessed-Lower Body Bathing: Shower (simulated) Upper Body Dressing: Not assessed Lower Body Dressing: Setup(donning shoes EOB) Where Assessed-Lower Body Dressing: Edge of bed Toileting: Not assessed Toilet Transfer: Minimal assistance Toilet Transfer Method: Ambulating(RW) Science writer: Grab bars Tub/Shower Transfer: Minimal assistance Tub/Shower Transfer Method: Ambulating(RW) Insurance underwriter: Radio broadcast assistant, Energy manager: Minimal assistance Social research officer, government Method: Ambulating(RW) ADL comments: Max vcs for walker safety  Vision Baseline Vision/History: No visual deficits Patient Visual Report: Diplopia;Blurring of vision Vision Assessment?: Vision impaired- to be further tested in functional context Diplopia Assessment: Objects split on top of one another;Disappears with one eye closed Perception  Perception: Within Functional Limits Praxis Praxis: Intact Cognition Overall Cognitive Status: Within Functional Limits for tasks assessed Arousal/Alertness: Awake/alert Orientation Level: Person;Place;Situation Person: Oriented Place: Oriented Situation: Oriented Year: 2020 Month: March Day of Week: Correct Memory: Appears intact Immediate Memory Recall: Sock;Blue;Bed Memory Recall: Sock;Blue;Bed Memory Recall Sock: With Cue Memory Recall Blue: Without Cue Memory Recall Bed: Without Cue Awareness: Appears intact Problem Solving: Appears intact Behaviors: Restless Safety/Judgment: Appears intact Sensation Sensation Light Touch: Appears Intact(Intact B UEs with assessment, however he reports tingling in all digits) Hot/Cold: Impaired Detail Hot/Cold Impaired Details: Impaired  LUE Coordination Gross Motor Movements are Fluid and Coordinated: No Fine Motor Movements are Fluid and Coordinated: Yes(WFL to tie shoelaces and play piano ) Coordination and Movement Description: Ataxic gross motor during functional ambulation without device   Finger Nose Finger Test: WNL Rt and Lt Motor  Motor Motor: Ataxia Trunk/Postural Assessment  Cervical Assessment Cervical Assessment: Within Functional Limits Thoracic Assessment Thoracic Assessment: Within Functional Limits Lumbar Assessment Lumbar Assessment: Within Functional Limits Postural Control Postural Control: Deficits on evaluation(Rt lateral lean in standing )  Balance Balance Balance Assessed: Yes Dynamic Sitting Balance Dynamic Sitting - Balance Support: Feet supported;No upper extremity supported Dynamic Sitting - Level of Assistance: 5: Stand by assistance(donning shoes EOB) Dynamic Standing Balance Dynamic Standing - Balance Support: During functional activity;Right upper extremity supported Dynamic Standing - Level of Assistance: 4: Min assist(simulated  perihygiene completion in shower with use of grab bar) Dynamic Standing - Balance Activities: Lateral lean/weight shifting;Forward lean/weight shifting Extremity/Trunk Assessment RUE Assessment RUE Assessment: Within Functional Limits Active Range of Motion (AROM) Comments: No ROM limitations observed  General Strength Comments: 4/5 deltoids + triceps, 4+/5 biceps LUE Assessment LUE Assessment: Within Functional Limits Active Range of Motion (AROM) Comments: No ROM limitated observed  General Strength Comments: 4-/5 deltoids + triceps, 4/5 biceps    Refer to Care Plan for Long Term Goals  Recommendations for other services: None    Discharge Criteria: Patient will be discharged from OT if patient refuses treatment 3 consecutive times without medical reason, if treatment goals not met, if there is a change in medical status, if patient makes no  progress towards goals or if patient is discharged from hospital.  The above assessment, treatment plan, treatment alternatives and goals were discussed and mutually agreed upon: by patient  Skeet Simmer 07/08/2018, 12:25 PM

## 2018-07-08 NOTE — Progress Notes (Signed)
[facility] PHYSICAL MEDICINE & REHABILITATION PROGRESS NOTE   Subjective/Complaints: Had a fair night. Still had hiccups. Baclofen didn't stop them  ROS: Patient denies fever, rash, sore throat, blurred vision, nausea, vomiting, diarrhea, cough, shortness of breath or chest pain, joint or back pain, headache, or mood change.    Objective:   No results found. Recent Labs    07/07/18 1435  WBC 8.1  HGB 14.1  HCT 40.8  PLT 219   Recent Labs    07/06/18 0550 07/07/18 1435  NA 141  --   K 3.7  --   CL 107  --   CO2 26  --   GLUCOSE 132*  --   BUN 21*  --   CREATININE 0.85 1.00  CALCIUM 9.4  --     Intake/Output Summary (Last 24 hours) at 07/08/2018 1134 Last data filed at 07/08/2018 1029 Gross per 24 hour  Intake 600 ml  Output 1225 ml  Net -625 ml     Physical Exam: Vital Signs Blood pressure 139/89, pulse 69, temperature 98.4 F (36.9 C), temperature source Oral, resp. rate 17, height 5\' 10"  (1.778 m), weight 68.3 kg, SpO2 99 %. Constitutional: No distress . Vital signs reviewed. HEENT: EOMI, oral membranes moist Neck: supple Cardiovascular: RRR without murmur. No JVD    Respiratory: CTA Bilaterally without wheezes or rales. Normal effort    GI: BS +, non-tender, non-distended  Musculoskeletal:Normal range of motion.  General: No deformityor edema.  Neurological: He isalertand oriented to person, place, and time. Some diplopia. No nystagmus. Gaze not grossly dysconjugate.  No gross limb ataxia. Notes decreased sensation to palm and fingers of left hand, slight tingling fingers right hand. Motor 4+/5 in all 4 limbs proximal to distal.  Skin: Skin iswarm.  Psychiatric: pleasant and cooperative     Assessment/Plan: 1. 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  Physiatrist is providing close team supervision and 24 hour management of  active medical problems listed below.  Physiatrist and rehab team continue to assess barriers to discharge/monitor patient progress toward functional and medical goals  Care Tool:  Bathing              Bathing assist       Upper Body Dressing/Undressing Upper body dressing   What is the patient wearing?: Hospital gown only    Upper body assist Assist Level: Independent    Lower Body Dressing/Undressing Lower body dressing      What is the patient wearing?: Pants, Underwear/pull up     Lower body assist Assist for lower body dressing: Supervision/Verbal cueing     Toileting Toileting    Toileting assist Assist for toileting: Independent(pt uses the urinal)     Transfers Chair/bed transfer  Transfers assist  Chair/bed transfer activity did not occur: Safety/medical concerns  Chair/bed transfer assist level: Minimal Assistance - Patient > 75%     Locomotion Ambulation   Ambulation assist      Assist level: Maximal Assistance - Patient 25 - 49% Assistive device: Other (comment)(none) Max distance: 25'   Walk 10 feet activity   Assist     Assist level: Maximal Assistance - Patient 25 - 49% Assistive device: Other (comment)(none)   Walk 50 feet activity   Assist Walk 50 feet with 2 turns activity did not occur: Safety/medical concerns         Walk 150 feet activity  Assist Walk 150 feet activity did not occur: Safety/medical concerns         Walk 10 feet on uneven surface  activity   Assist Walk 10 feet on uneven surfaces activity did not occur: Safety/medical concerns         Wheelchair     Assist Will patient use wheelchair at discharge?: No   Wheelchair activity did not occur: N/A         Wheelchair 50 feet with 2 turns activity    Assist    Wheelchair 50 feet with 2 turns activity did not occur: N/A       Wheelchair 150 feet activity     Assist Wheelchair 150 feet activity did not occur:  N/A        Medical Problem List and Plan: 1.Decreased functional mobility with dizzinesssecondary to multifocal acute infarct right PICA territory and 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 Pt with ongoing diplopia, hiccups, balance and coordination deficits. -beginning therapies -increase baclofen to 10mg  tid prn hiccups 2. Antithrombotics: -DVT/anticoagulation:Subcutaneous Lovenox -antiplatelet therapy: aspirin 81 mg daily, Plavix 75 mg daily 3. Pain Management:Tylenol as needed 4. Mood:Provide emotional support -antipsychotic agents: None 5. Neuropsych: This patientiscapable of making decisions on hisown behalf. 6. Skin/Wound Care:Routine skin checks 7. Fluids/Electrolytes/Nutrition:Routine ins and outs with follow-up chemistries monday 8. Lopressor 25 mg twice a day, Norvasc 5 mg daily.   -borderline control at present 9. History of CAD with myocardial infarction/stenting. Continue aspirin and Plavix. Patient was noncompliant with Brilinta. Continue Ranexa 500 mg twice a day 10. Hyperlipidemia. Lipitor 11. History of tobacco polysubstance abuse. Urine drug screen positive for cocaine. Provide counseling 12. Questionable medical noncompliance. Provide counseling 13. Constipation. Laxative assistance, had bm 3/13    LOS: 1 days A FACE TO FACE EVALUATION WAS PERFORMED  Ranelle Oyster 07/08/2018, 11:34 AM

## 2018-07-08 NOTE — Plan of Care (Signed)
  Problem: Consults Goal: RH STROKE PATIENT EDUCATION Description See Patient Education module for education specifics  Outcome: Progressing Goal: Nutrition Consult-if indicated Outcome: Progressing Goal: Diabetes Guidelines if Diabetic/Glucose > 140 Description If diabetic or lab glucose is > 140 mg/dl - Initiate Diabetes/Hyperglycemia Guidelines & Document Interventions  Outcome: Progressing   Problem: RH BOWEL ELIMINATION Goal: RH STG MANAGE BOWEL WITH ASSISTANCE Description STG Manage Bowel with Min Assistance.  Outcome: Progressing Goal: RH STG MANAGE BOWEL W/MEDICATION W/ASSISTANCE Description STG Manage Bowel with Medication with min Assistance.  Outcome: Progressing   Problem: RH BLADDER ELIMINATION Goal: RH STG MANAGE BLADDER WITH ASSISTANCE Description STG Manage Bladder With Min Assistance  Outcome: Progressing   Problem: RH SKIN INTEGRITY Goal: RH STG SKIN FREE OF INFECTION/BREAKDOWN Description Patient will remain free from skin breakdown during admission  Outcome: Progressing Goal: RH STG MAINTAIN SKIN INTEGRITY WITH ASSISTANCE Description STG Maintain Skin Integrity With mod I Assistance.  Outcome: Progressing   Problem: RH SAFETY Goal: RH STG ADHERE TO SAFETY PRECAUTIONS W/ASSISTANCE/DEVICE Description STG Adhere to Safety Precautions With Min Assistance/Device.  Outcome: Progressing   Problem: RH COGNITION-NURSING Goal: RH STG ANTICIPATES NEEDS/CALLS FOR ASSIST W/ASSIST/CUES Description STG Anticipates Needs/Calls for Assist With Min Assistance/Cues.  Outcome: Progressing   Problem: RH PAIN MANAGEMENT Goal: RH STG PAIN MANAGED AT OR BELOW PT'S PAIN GOAL Description Patient will be pain free or pain less than 3  Outcome: Progressing   Problem: RH KNOWLEDGE DEFICIT Goal: RH STG INCREASE KNOWLEDGE OF HYPERTENSION Outcome: Progressing Goal: RH STG INCREASE KNOWLEDGE OF STROKE PROPHYLAXIS Outcome: Progressing   

## 2018-07-08 NOTE — Evaluation (Addendum)
Physical Therapy Assessment and Plan  Patient Details  Name: Kenneth Morales MRN: [id number] Date of Birth: 02-22-1966  PT Diagnosis: Abnormality of gait, Ataxia, Ataxic gait, Coordination disorder, Difficulty walking and Impaired sensation Rehab Potential: Excellent ELOS: 10-14 days   Today's Date: 07/08/2018 PT Individual Time: 0800-0910 PT Individual Time Calculation (min): 70 min    Problem List:  Patient Active Problem List   Diagnosis Date Noted  . 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 Stroke (cerebrum) ([place]) 07/04/2018  . Unstable angina ([place]) 01/26/2018  . NSTEMI (non-ST elevated myocardial infarction) ([place]) 11/28/2017  . 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 Chest pain 07/01/2016  . SOB (shortness of breath) 12/10/2011  . CAD (coronary artery disease)   . Hyperlipidemia   . Hypertension   . Tobacco abuse   . Cocaine abuse [facility])     Past Medical History:  Past Medical History:  Diagnosis Date  . Acute MI ([place]) MAY 2013  . CAD (coronary artery disease)    NSTEMI in 05/13 in setting of Cocaine use. Cath: 99% mid LCX stenosis with a large thrombus. PCI and BMS (4.0 X15 mm Vision) placement to mid LCX, LAD: 20%, RCA: 30%, EF: 60%.   . Cocaine abuse ([place])    quit in 08/2011  . Depression   . Hyperlipidemia   . Hypertension   . Spider bite   . Tobacco abuse    Past Surgical History:  Past Surgical History:  Procedure Laterality Date  . CARDIAC CATHETERIZATION  MAY 2013   s/p stent @ [place]  . CARDIAC CATHETERIZATION    . CORONARY CTO INTERVENTION N/A 01/27/2018   Procedure: CORONARY CTO INTERVENTION;  Surgeon: Jettie Booze, MD;  Location: [place] [facility];  Service: Cardiovascular;  Laterality: N/A;  . CORONARY STENT INTERVENTION N/A 11/29/2017   Procedure: CORONARY STENT INTERVENTION;  Surgeon: Yolonda Kida, MD;  Location: [place] [facility];  Service: Cardiovascular;  Laterality: N/A;  . CORONARY STENT INTERVENTION N/A 01/27/2018   Procedure: CORONARY STENT INTERVENTION;  Surgeon: Jettie Booze, MD;  Location: [place] [facility];  Service: Cardiovascular;  Laterality: N/A;  mid cfx  . HEMORRHOID SURGERY    . LEFT HEART CATH AND CORONARY ANGIOGRAPHY Right 11/29/2017   Procedure: Left heart catheterization with possible PCI;  Surgeon: Dionisio David, MD;  Location: [place] [facility];  Service: Cardiovascular;  Laterality: Right;  . LEFT HEART CATH AND CORONARY ANGIOGRAPHY N/A 01/27/2018   Procedure: LEFT HEART CATH AND CORONARY ANGIOGRAPHY;  Surgeon: Jettie Booze, MD;  Location: [place] [facility];  Service: Cardiovascular;  Laterality: N/A;    Assessment & Plan Clinical Impression: Patient is a 53 year old right-handed male with history of CAD status post PCI maintained on Brilinta/aspirin present but noncompliant, tobacco/polysubstance abuse, hypertension maintained on Norvasc 5 mg twice daily, Lopressor 25 mg twice a day. Per chart review patient lives with 73 year old daughter. Two-level home. Independent prior to admission.Presented 07/04/2018 with dyspnea, nausea and intermittent shortness of breath as well as numerous falls. He was recently seen in the ED for a viral illness placed on antibiotic therapy and discharged home. CT of the head showed right cerebellar infarction likely subacute no hemorrhage. Patient did not receive TPA.Urine drug screen positive for cocaine. CT angiogram of head and neck with no large vessel occlusion. 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 No mass effect. Echocardiogram with ejection fraction  of 65% hyperdynamic systolic function. Neurology follow-up currently on aspirin and Plavix for CVA prophylaxis. Patient's Brilinta has been discontinued. Subcutaneous Lovenox for DVT prophylaxis. Tolerating a regular diet. Patient transferred to CIR on 07/07/2018 .   Patient currently  requires max with mobility secondary to decreased cardiorespiratoy endurance, unbalanced muscle activation, ataxia and decreased coordination, decreased visual motor skills and decreased standing balance, decreased postural control and decreased balance strategies.  Prior to hospitalization, patient was independent  with mobility and lived with Family(single parent to 15 yo daughter) in a [place].  Home access is  Level entry.  Patient will benefit from skilled PT intervention to maximize safe functional mobility, minimize fall risk and decrease caregiver burden for planned discharge home with 24 hour supervision.  Anticipate patient will benefit from follow up [place] at discharge.  PT - End of Session Activity Tolerance: Tolerates 30+ min activity with multiple rests Endurance Deficit: Yes Endurance Deficit Description: decreased PT Assessment Rehab Potential (ACUTE/IP ONLY): Excellent PT Barriers to Discharge: Home environment access/layout PT Barriers to Discharge Comments: 12 steps to get to bathroom/bedroom on 2nd floor of apartment PT Patient demonstrates impairments in the following area(s): Balance;Behavior;Endurance;Motor;Safety;Sensory PT Transfers Functional Problem(s): Bed Mobility;Bed to Chair;Furniture;Car;Floor PT Locomotion Functional Problem(s): Stairs;Ambulation PT Plan PT Intensity: Minimum of 1-2 x/day ,45 to 90 minutes PT Frequency: 5 out of 7 days PT Duration Estimated Length of Stay: 10-14 days PT Treatment/Interventions: Ambulation/gait training;Disease management/prevention;Pain management;Stair training;Visual/perceptual remediation/compensation;Wheelchair propulsion/positioning;Therapeutic Activities;Patient/family education;DME/adaptive equipment instruction;Balance/vestibular training;Functional electrical stimulation;Psychosocial support;Therapeutic Exercise;Community reintegration;Functional mobility training;Skin care/wound management;UE/LE Strength  taining/ROM;UE/LE Coordination activities;Splinting/orthotics;Neuromuscular re-education;Discharge planning PT Transfers Anticipated Outcome(s): modified independent PT Locomotion Anticipated Outcome(s): supervision household gait w/ LRAD PT Recommendation Recommendations for Other Services: Therapeutic Recreation consult Patient Destination: Home Follow Up Recommendations: Home health PT(OPPT if parents can safely drive pt to/from PT) Equipment Recommended: To be determined  Skilled Therapeutic Intervention  Pt in supine and agreeable to therapy, no c/o pain. Supervision bed mobility and min assist transfer to w/c. Total assist w/c transport to/from therapy gym. Performed functional mobility as detailed below including gait w/o AD and stair negotiation. Additionally performed car transfer w/ min assist. Pt very reliant on UE support w/ all mobility. Very unstable and ataxic LEs w/o UE support. Gait training w/ RW performed, ambulated 50', 150, and 250' w/ min assist using RW. Tactile and verbal cues for upright posture. NuStep 10 min @ level 5 for global endurance training. Returned to room via w/c. Instructed pt in results of PT evaluation as detailed below, PT POC, rehab potential, rehab goals, and discharge recommendations. Ended session in supine, all needs in reach.  PT Evaluation Precautions/Restrictions Precautions Precautions: Fall Restrictions Weight Bearing Restrictions: No Pain Pain Assessment Pain Scale: 0-10 Pain Score: 0-No pain Home Living/Prior Functioning Home Living Available Help at Discharge: Family;Available PRN/intermittently(pt's parents able to provide 24/7 supervision/assist if needed, parents watching after pt's daughter right now) Type of Home: Apartment Home Access: Level entry Home Layout: Two level Alternate Level Stairs-Number of Steps: Pt could not live on main level. All bedrooms and bathrooms are on second level of apartment. Alternate Level  Stairs-Rails: Left Bathroom Shower/Tub: Tub/shower unit;Curtain Bathroom Toilet: Standard  Lives With: Family(single parent to 4 yo daughter) Prior Function Level of Independence: Independent with basic ADLs;Independent with homemaking with ambulation;Independent with gait;Independent with transfers  Able to Take Stairs?: Yes Driving: Yes Vocation: Unemployed Vocation Requirements: unemployed since having multiple heart attacks, has been in and out of work/vocational rehab programs, planning to sign  up for disability Leisure: Hobbies-yes (Comment) Comments: enjoys music, playing guitar and piano Vision/Perception  Vision - Assessment Eye Alignment: Within Functional Limits Ocular Range of Motion: Within Functional Limits Alignment/Gaze Preference: Within Defined Limits Tracking/Visual Pursuits: Impaired - to be further tested in functional context(decreased smoothness of all visual tracking) Saccades: Decreased speed of saccadic movement;Additional head turns occurred during testing Convergence: Impaired - to be further tested in functional context Diplopia Assessment: Objects split on top of one another;Disappears with one eye closed Additional Comments: Pt reports ongoing visual deficits detailed above, however is improving Perception Perception: Within Functional Limits Praxis Praxis: Intact  Cognition Overall Cognitive Status: Within Functional Limits for tasks assessed Arousal/Alertness: Awake/alert Orientation Level: Oriented X4 Memory: Appears intact Awareness: Appears intact Problem Solving: Appears intact Behaviors: Restless(mild restlessness, pt reports this is baseline) Safety/Judgment: Appears intact Sensation Sensation Light Touch: Impaired by gross assessment(diminished sensation L side ) Coordination Gross Motor Movements are Fluid and Coordinated: No Heel Shin Test: Mild impairment LLE compared to R Motor  Motor Motor: Ataxia  Mobility Bed Mobility Bed  Mobility: Rolling Left;Rolling Right;Supine to Sit;Sit to Supine Rolling Right: Supervision/verbal cueing Rolling Left: Supervision/Verbal cueing Supine to Sit: Supervision/Verbal cueing Sit to Supine: Supervision/Verbal cueing Transfers Transfers: Sit to Stand;Stand to Sit;Stand Pivot Transfers Sit to Stand: Minimal Assistance - Patient > 75% Stand to Sit: Minimal Assistance - Patient > 75% Stand Pivot Transfers: Minimal Assistance - Patient > 75% Transfer (Assistive device): None Locomotion  Gait Ambulation: Yes Gait Assistance: Maximal Assistance - Patient 25-49% Gait Distance (Feet): 25 Feet Assistive device: None Gait Assistance Details: Manual facilitation for weight shifting;Manual facilitation for placement;Manual facilitation for weight bearing;Verbal cues for precautions/safety;Verbal cues for gait pattern Gait Gait: Yes Gait Pattern: Impaired Gait Pattern: Scissoring;Ataxic Gait velocity: decreased and very cautious Stairs / Additional Locomotion Stairs: Yes Stairs Assistance: Minimal Assistance - Patient > 75% Stair Management Technique: Two rails Number of Stairs: 8 Height of Stairs: 6 Wheelchair Mobility Wheelchair Mobility: No  Trunk/Postural Assessment  Cervical Assessment Cervical Assessment: Within Functional Limits Thoracic Assessment Thoracic Assessment: Within Functional Limits Lumbar Assessment Lumbar Assessment: Within Functional Limits Postural Control Postural Control: Deficits on evaluation(R lateral lean in standing, pt aware of this and actively trying to correct)  Balance Balance Balance Assessed: Yes Static Sitting Balance Static Sitting - Balance Support: Feet supported;No upper extremity supported Static Sitting - Level of Assistance: 5: Stand by assistance Dynamic Sitting Balance Dynamic Sitting - Balance Support: Feet supported;No upper extremity supported Dynamic Sitting - Level of Assistance: 5: Stand by assistance Static Standing  Balance Static Standing - Balance Support: No upper extremity supported;During functional activity Static Standing - Level of Assistance: 4: Min assist Dynamic Standing Balance Dynamic Standing - Balance Support: No upper extremity supported;During functional activity Dynamic Standing - Level of Assistance: 2: Max assist Extremity Assessment  RLE Assessment RLE Assessment: Within Functional Limits LLE Assessment LLE Assessment: Within Functional Limits    Refer to Care Plan for Long Term Goals  Recommendations for other services: Therapeutic Recreation  Outing/community reintegration  Discharge Criteria: Patient will be discharged from PT if patient refuses treatment 3 consecutive times without medical reason, if treatment goals not met, if there is a change in medical status, if patient makes no progress towards goals or if patient is discharged from hospital.  The above assessment, treatment plan, treatment alternatives and goals were discussed and mutually agreed upon: by patient  Vernice Mannina K Wileen Duncanson 07/08/2018, 9:15 AM

## 2018-07-09 MED ORDER — BACLOFEN 10 MG PO TABS
10.0000 mg | ORAL_TABLET | Freq: Three times a day (TID) | ORAL | Status: DC
  Administered 2018-07-09 – 2018-07-10 (×3): 10 mg via ORAL
  Filled 2018-07-09 (×3): qty 1

## 2018-07-09 NOTE — Progress Notes (Signed)
PHYSICAL MEDICINE & REHABILITATION PROGRESS NOTE   Subjective/Complaints: Still having hiccups although increase in baclofen helped (only taking prn)  ROS: Patient denies fever, rash, sore throat,  nausea, vomiting, diarrhea, cough, shortness of breath or chest pain, joint or back pain, headache, or mood change.     Objective:   No results found. Recent Labs    07/07/18 1435  WBC 8.1  HGB 14.1  HCT 40.8  PLT 219   Recent Labs    07/07/18 1435  CREATININE 1.00    Intake/Output Summary (Last 24 hours) at 07/09/2018 1015 Last data filed at 07/09/2018 0345 Gross per 24 hour  Intake 240 ml  Output 900 ml  Net -660 ml     Physical Exam: Vital Signs Blood pressure (!) 142/87, pulse 66, temperature 98.4 F (36.9 C), temperature source Oral, resp. rate 19, height 5\' 10"  (1.778 m), weight 68.3 kg, SpO2 100 %. Constitutional: No distress . Vital signs reviewed. HEENT: EOMI, oral membranes moist Neck: supple Cardiovascular: RRR without murmur. No JVD    Respiratory: CTA Bilaterally without wheezes or rales. Normal effort    GI: BS +, non-tender, non-distended  Musculoskeletal:Normal range of motion.  General: No deformityor edema.  Neurological: He isalertand oriented to person, place, and time. Diplopia. No nystagmus. Gaze not grossly dysconjugate.  No gross limb ataxia. Notes decreased sensation to palm and fingers of left hand, slight tingling fingers right hand. Motor 4+/5 in all 4 limbs proximal to distal.  Skin: Skin iswarm.  Psychiatric: pleasant and cooperative     Assessment/Plan: 1. 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  Physiatrist is providing close team supervision and 24 hour management of active medical problems listed below.  Physiatrist and rehab team continue to assess barriers to discharge/monitor patient  progress toward functional and medical goals  Care Tool:  Bathing    Body parts bathed by patient: Right arm, Left arm, Chest, Abdomen, Front perineal area, Buttocks, Left upper leg, Right lower leg, Right upper leg, Left lower leg, Face         Bathing assist Assist Level: Minimal Assistance - Patient > 75%(simulated)     Upper Body Dressing/Undressing Upper body dressing Upper body dressing/undressing activity did not occur (including orthotics): Refused What is the patient wearing?: Pull over shirt    Upper body assist Assist Level: Minimal Assistance - Patient > 75%    Lower Body Dressing/Undressing Lower body dressing    Lower body dressing activity did not occur: Refused What is the patient wearing?: Pants     Lower body assist Assist for lower body dressing: Minimal Assistance - Patient > 75%     Toileting Toileting Toileting Activity did not occur Press photographer and hygiene only): N/A (no void or bm)  Toileting assist Assist for toileting: Supervision/Verbal cueing     Transfers Chair/bed transfer  Transfers assist  Chair/bed transfer activity did not occur: Safety/medical concerns  Chair/bed transfer assist level: Minimal Assistance - Patient > 75%     Locomotion Ambulation   Ambulation assist      Assist level: Maximal Assistance - Patient 25 - 49% Assistive device: Other (comment)(none) Max distance: 25'   Walk 10 feet activity   Assist     Assist level: Maximal Assistance - Patient 25 - 49% Assistive device: Other (comment)(none)   Walk 50 feet activity   Assist Walk 50 feet with 2 turns activity  did not occur: Safety/medical concerns         Walk 150 feet activity   Assist Walk 150 feet activity did not occur: Safety/medical concerns         Walk 10 feet on uneven surface  activity   Assist Walk 10 feet on uneven surfaces activity did not occur: Safety/medical concerns         Wheelchair     Assist  Will patient use wheelchair at discharge?: No   Wheelchair activity did not occur: N/A         Wheelchair 50 feet with 2 turns activity    Assist    Wheelchair 50 feet with 2 turns activity did not occur: N/A       Wheelchair 150 feet activity     Assist Wheelchair 150 feet activity did not occur: N/A        Medical Problem List and Plan: 1.Decreased functional mobility with dizzinesssecondary to multifocal acute infarct right PICA territory and 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 Pt with ongoing diplopia, hiccups, balance and coordination deficits. --Continue CIR therapies including PT, OT  -change baclofen to 10mg  tid SCHEDULED for hiccups 2. Antithrombotics: -DVT/anticoagulation:Subcutaneous Lovenox -antiplatelet therapy: aspirin 81 mg daily, Plavix 75 mg daily 3. Pain Management:Tylenol as needed 4. Mood:Provide emotional support -antipsychotic agents: None 5. Neuropsych: This patientiscapable of making decisions on hisown behalf. 6. Skin/Wound Care:Routine skin checks 7. Fluids/Electrolytes/Nutrition:Routine ins and outs with follow-up chemistries monday 8. Lopressor 25 mg twice a day, Norvasc 5 mg daily.   -borderline control 3/15 9. History of CAD with myocardial infarction/stenting. Continue aspirin and Plavix. Patient was noncompliant with Brilinta. Continue Ranexa 500 mg twice a day 10. Hyperlipidemia. Lipitor 11. History of tobacco polysubstance abuse. Urine drug screen positive for cocaine. Provide counseling 12. Questionable medical noncompliance. Provide counseling 13. Constipation. Laxative assistance, had bm 3/13    LOS: 2 days A FACE TO FACE EVALUATION WAS PERFORMED  Ranelle Oyster 07/09/2018, 10:15 AM

## 2018-07-09 NOTE — Plan of Care (Signed)
  Problem: Consults Goal: RH STROKE PATIENT EDUCATION Description See Patient Education module for education specifics  Outcome: Progressing Goal: Nutrition Consult-if indicated Outcome: Progressing Goal: Diabetes Guidelines if Diabetic/Glucose > 140 Description If diabetic or lab glucose is > 140 mg/dl - Initiate Diabetes/Hyperglycemia Guidelines & Document Interventions  Outcome: Progressing   Problem: RH BOWEL ELIMINATION Goal: RH STG MANAGE BOWEL WITH ASSISTANCE Description STG Manage Bowel with Min Assistance.  Outcome: Progressing Goal: RH STG MANAGE BOWEL W/MEDICATION W/ASSISTANCE Description STG Manage Bowel with Medication with min Assistance.  Outcome: Progressing   Problem: RH BLADDER ELIMINATION Goal: RH STG MANAGE BLADDER WITH ASSISTANCE Description STG Manage Bladder With Min Assistance  Outcome: Progressing   Problem: RH SKIN INTEGRITY Goal: RH STG SKIN FREE OF INFECTION/BREAKDOWN Description Patient will remain free from skin breakdown during admission  Outcome: Progressing Goal: RH STG MAINTAIN SKIN INTEGRITY WITH ASSISTANCE Description STG Maintain Skin Integrity With mod I Assistance.  Outcome: Progressing   Problem: RH SAFETY Goal: RH STG ADHERE TO SAFETY PRECAUTIONS W/ASSISTANCE/DEVICE Description STG Adhere to Safety Precautions With Min Assistance/Device.  Outcome: Progressing   Problem: RH COGNITION-NURSING Goal: RH STG ANTICIPATES NEEDS/CALLS FOR ASSIST W/ASSIST/CUES Description STG Anticipates Needs/Calls for Assist With Min Assistance/Cues.  Outcome: Progressing   Problem: RH PAIN MANAGEMENT Goal: RH STG PAIN MANAGED AT OR BELOW PT'S PAIN GOAL Description Patient will be pain free or pain less than 3  Outcome: Progressing   Problem: RH KNOWLEDGE DEFICIT Goal: RH STG INCREASE KNOWLEDGE OF HYPERTENSION Outcome: Progressing Goal: RH STG INCREASE KNOWLEDGE OF STROKE PROPHYLAXIS Outcome: Progressing

## 2018-07-10 ENCOUNTER — Inpatient Hospital Stay (HOSPITAL_COMMUNITY): Payer: Medicaid Other

## 2018-07-10 ENCOUNTER — Inpatient Hospital Stay (HOSPITAL_COMMUNITY): Payer: Medicaid Other | Admitting: Occupational Therapy

## 2018-07-10 DIAGNOSIS — R066 Hiccough: Secondary | ICD-10-CM

## 2018-07-10 DIAGNOSIS — R269 Unspecified abnormalities of gait and mobility: Secondary | ICD-10-CM

## 2018-07-10 DIAGNOSIS — I69398 Other sequelae of cerebral infarction: Secondary | ICD-10-CM

## 2018-07-10 DIAGNOSIS — I69393 Ataxia following cerebral infarction: Secondary | ICD-10-CM

## 2018-07-10 LAB — COMPREHENSIVE METABOLIC PANEL
ALT: 62 U/L — ABNORMAL HIGH (ref 0–44)
AST: 58 U/L — ABNORMAL HIGH (ref 15–41)
Albumin: 3.4 g/dL — ABNORMAL LOW (ref 3.5–5.0)
Alkaline Phosphatase: 114 U/L (ref 38–126)
Anion gap: 7 (ref 5–15)
BUN: 18 mg/dL (ref 6–20)
CO2: 25 mmol/L (ref 22–32)
Calcium: 9.1 mg/dL (ref 8.9–10.3)
Chloride: 106 mmol/L (ref 98–111)
Creatinine, Ser: 0.92 mg/dL (ref 0.61–1.24)
GFR calc Af Amer: >60 mL/min (ref >60)
GFR calc non Af Amer: >60 mL/min (ref >60)
Glucose, Bld: 195 mg/dL — ABNORMAL HIGH (ref 70–99)
Potassium: 4.1 mmol/L (ref 3.5–5.1)
Sodium: 138 mmol/L (ref 135–145)
Total Bilirubin: 0.5 mg/dL (ref 0.3–1.2)
Total Protein: 6.8 g/dL (ref 6.5–8.1)

## 2018-07-10 LAB — CBC WITH DIFFERENTIAL/PLATELET
Abs Immature Granulocytes: 0.04 10*3/uL (ref 0.00–0.07)
Basophils Absolute: 0 10*3/uL (ref 0.0–0.1)
Basophils Relative: 0 %
Eosinophils Absolute: 0.1 10*3/uL (ref 0.0–0.5)
Eosinophils Relative: 2 %
HCT: 44.1 % (ref 39.0–52.0)
Hemoglobin: 15.4 g/dL (ref 13.0–17.0)
Immature Granulocytes: 1 %
Lymphocytes Relative: 22 %
Lymphs Abs: 1.8 10*3/uL (ref 0.7–4.0)
MCH: 33.7 pg (ref 26.0–34.0)
MCHC: 34.9 g/dL (ref 30.0–36.0)
MCV: 96.5 fL (ref 80.0–100.0)
Monocytes Absolute: 1 10*3/uL (ref 0.1–1.0)
Monocytes Relative: 11 %
Neutro Abs: 5.4 10*3/uL (ref 1.7–7.7)
Neutrophils Relative %: 64 %
Platelets: 272 10*3/uL (ref 150–400)
RBC: 4.57 MIL/uL (ref 4.22–5.81)
RDW: 12.5 % (ref 11.5–15.5)
WBC: 8.4 10*3/uL (ref 4.0–10.5)
nRBC: 0 % (ref 0.0–0.2)

## 2018-07-10 MED ORDER — BACLOFEN 5 MG HALF TABLET
15.0000 mg | ORAL_TABLET | Freq: Three times a day (TID) | ORAL | Status: DC
  Administered 2018-07-10 – 2018-07-12 (×6): 15 mg via ORAL
  Filled 2018-07-10 (×6): qty 1

## 2018-07-10 NOTE — Progress Notes (Signed)
Social Work  Social Work Assessment and Plan  Patient Details  Name: Kenneth Morales MRN: [id number] Date of Birth: 1965-07-08  Today's Date: 07/10/2018  Problem List:  Patient Active Problem List   Diagnosis Date Noted  . 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 Stroke (cerebrum) (HCC) 07/04/2018  . Unstable angina (HCC) 01/26/2018  . NSTEMI (non-ST elevated myocardial infarction) (HCC) 11/28/2017  . 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 Chest pain 07/01/2016  . SOB (shortness of breath) 12/10/2011  . CAD (coronary artery disease)   . Hyperlipidemia   . Hypertension   . Tobacco abuse   . Cocaine abuse [facility])    Past Medical History:  Past Medical History:  Diagnosis Date  . Acute MI (HCC) MAY 2013  . CAD (coronary artery disease)    NSTEMI in 05/13 in setting of Cocaine use. Cath: 99% mid LCX stenosis with a large thrombus. PCI and BMS (4.0 X15 mm Vision) placement to mid LCX, LAD: 20%, RCA: 30%, EF: 60%.   . Cocaine abuse (HCC)    quit in 08/2011  . Depression   . Hyperlipidemia   . Hypertension   . Spider bite   . Tobacco abuse    Past Surgical History:  Past Surgical History:  Procedure Laterality Date  . CARDIAC CATHETERIZATION  MAY 2013   s/p stent @ [facility]  . CARDIAC CATHETERIZATION    . CORONARY CTO INTERVENTION N/A 01/27/2018   Procedure: CORONARY CTO INTERVENTION;  Surgeon: Corky Crafts, MD;  Location: [facility] INVASIVE CV LAB;  Service: Cardiovascular;  Laterality: N/A;  . CORONARY STENT INTERVENTION N/A 11/29/2017   Procedure: CORONARY STENT INTERVENTION;  Surgeon: Alwyn Pea, MD;  Location: [facility] INVASIVE CV LAB;  Service: Cardiovascular;  Laterality: N/A;  . CORONARY STENT INTERVENTION N/A 01/27/2018   Procedure: CORONARY STENT INTERVENTION;  Surgeon: Corky Crafts, MD;  Location: [facility] INVASIVE CV LAB;  Service: Cardiovascular;  Laterality: N/A;  mid cfx  . HEMORRHOID SURGERY    . LEFT HEART CATH AND CORONARY ANGIOGRAPHY Right 11/29/2017    Procedure: Left heart catheterization with possible PCI;  Surgeon: Laurier Nancy, MD;  Location: [facility] INVASIVE CV LAB;  Service: Cardiovascular;  Laterality: Right;  . LEFT HEART CATH AND CORONARY ANGIOGRAPHY N/A 01/27/2018   Procedure: LEFT HEART CATH AND CORONARY ANGIOGRAPHY;  Surgeon: Corky Crafts, MD;  Location: [facility] INVASIVE CV LAB;  Service: Cardiovascular;  Laterality: N/A;   Social History:  reports that he has been smoking cigarettes. He has a 15.00 pack-year smoking history. He has never used smokeless tobacco. He reports current alcohol use of about 6.0 standard drinks of alcohol per week. He reports that he does not use drugs.  Family / Support Systems Marital Status: Single Patient Roles: Parent, Other (Comment)(musician) Children: Brianna-32 yo daughter Other Supports: [name] & [name]-parents [phone] Anticipated Caregiver: Parents Ability/Limitations of Caregiver: If needed family can provide supervision level Caregiver Availability: 24/7(If needed probably will not need) Family Dynamics: Close knit family, pt foound out he was a Dad when daughter was 48 yo and got custody of her. She has been with him since this time. He has friends who are supportive and will come and visit him. He plans to go back home with daughter  Social History Preferred language: English Religion: Baptist Cultural Background: No issues Education: [employer]  Read: Yes Write: Yes Employment Status: Unemployed Date Retired/Disabled/Unemployed: Applying for disability since surgery in 02/2018 Legal History/Current Legal Issues: No issues Guardian/Conservator: None-according to MD pt is  capable of making his own decisions while here   Abuse/Neglect Abuse/Neglect Assessment Can Be Completed: Yes Physical Abuse: Denies Verbal Abuse: Denies Sexual Abuse: Denies Exploitation of patient/patient's resources: Denies Self-Neglect: Denies  Emotional Status Pt's affect, behavior and  adjustment status: Pt is motivated and doing well, he is recovering from his stroke but still working on his balance issues. He reports he has always been independent and plans to go back home with daughter when discharged from here. Or if has too to go to parent's home for a short time Recent Psychosocial Issues: other health issues-stents in Nov and has not worked since that time Psychiatric History: History of depression has taken medications in the past and wonders it he needs them again. Will ask neuro-psych to see to address this and his substance abuse issues. Substance Abuse History: Pt admitted he did do cocaine due to having a party and he does smoke tobacco. He plans to quit for his health, he has alreayd had three heart attacks and now a stroke. Will provide information regarding resources for this.  Patient / Family Perceptions, Expectations & Goals Pt/Family understanding of illness & functional limitations: Pt is able to explain his stroke and issues. He feels good about his recovery and therapies. He does talk with the MD and feels he has a good understanding of his deficits and risks factors. 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, son, frined, musician, etc Anticipated changes in roles/activities/participation: resume Pt/family expectations/goals: Pt states: " I want to do well and get back home with my daughter."    [employer]: Other (Comment)(Applying for disability) Premorbid Home Care/DME Agencies: None Transportation available at discharge: Parents Resource referrals recommended: Neuropsychology, Support group (specify)  Discharge Planning Living Arrangements: Children Support Systems: Children, Parent, Friends/neighbors Type of Residence: Private residence Insurance Resources: [employer] (specify county) Architect: Other (Comment)(Food stamps) Financial Screen Referred: Yes Living Expenses: Rent Money Management: Patient Does  the patient have any problems obtaining your medications?: No Home Management: Patient Patient/Family Preliminary Plans: Plans to return home with his daughter and his parents will check daily if needed or he can go to his parent's home if needed, but he would rather not. His daughter is currently with her grandparents while pt is here. Social Work Anticipated Follow Up Needs: HH/OP, Support Group  Clinical Impression Pleasant gentleman who is motivated to do well and get back home with his daughter. His parents are involved and supportive. He plans to stop using drugs and this has scared him straight. He is aware CPS was contacted regarding a question if a CPS referral needed to be made due to his positive drug screen upon admission and his minor child in the home. CPS felt it was not needed at this time. Will work on discharge needs and refer him to see neuro-psych while here.   Lucy Chris 07/10/2018, 2:33 PM

## 2018-07-10 NOTE — Care Management (Signed)
Inpatient Rehabilitation Center Individual Statement of Services  Patient Name:  Kenneth Morales  Date:  07/10/2018  Welcome to the Inpatient Rehabilitation Center.  Our goal is to provide you with an individualized program based on your diagnosis and situation, designed to meet your specific needs.  With this comprehensive rehabilitation program, you will be expected to participate in at least 3 hours of rehabilitation therapies Monday-Friday, with modified therapy programming on the weekends.  Your rehabilitation program will include the following services:  Physical Therapy (PT), Occupational Therapy (OT), 24 hour per day rehabilitation nursing, Therapeutic Recreaction (TR), Neuropsychology, Case Management (Social Worker), Rehabilitation Medicine, Nutrition Services and Pharmacy Services  Weekly team conferences will be held on Wednesday to discuss your progress.  Your Social Worker will talk with you frequently to get your input and to update you on team discussions.  Team conferences with you and your family in attendance may also be held.  Expected length of stay: 10-14 days  Overall anticipated outcome: mod/i level  Depending on your progress and recovery, your program may change. Your Social Worker will coordinate services and will keep you informed of any changes. Your Social Worker's name and contact numbers are listed  below.  The following services may also be recommended but are not provided by the Inpatient Rehabilitation Center:   Driving Evaluations  Home Health Rehabiltiation Services  Outpatient Rehabilitation Services  Vocational Rehabilitation   Arrangements will be made to provide these services after discharge if needed.  Arrangements include referral to agencies that provide these services.  Your insurance has been verified to be:  Medicaid Your primary doctor is:  [facility]  Pertinent information will be shared with your doctor and your insurance  company.  Social Worker:  Dossie Der, SW [phone] or [phone]  Information discussed with and copy given to patient by: Lucy Chris, 07/10/2018, 2:18 PM

## 2018-07-10 NOTE — IPOC Note (Signed)
Overall Plan of Care [facility]) Patient Details Name: Kenneth Morales MRN: [id number] DOB: October 09, 1965  Admitting Diagnosis: <principal problem not specified>  Hospital Problems: Active Problems:   Cerebellar infarction [facility])     Functional Problem List: Nursing Endurance, Medication Management, Perception, Safety, Motor  PT Balance, Behavior, Endurance, Motor, Safety, Sensory  OT Balance, Vision, Behavior, Safety, Endurance, Sensory, Motor  SLP    TR         Basic ADL's: OT Grooming, Bathing, Dressing, Toileting     Advanced  ADL's: OT Simple Meal Preparation, Light Housekeeping     Transfers: PT Bed Mobility, Bed to Chair, [employer], Set designer, Civil Service fast streamer, Research scientist (life sciences): PT Stairs, Ambulation     Additional Impairments: OT None  SLP        TR      Anticipated Outcomes Item Anticipated Outcome  Self Feeding No goal  Swallowing      Basic self-care  Supervision/setup bathing, Mod I ADLs  Toileting  Mod I    Bathroom Transfers Mod I   Bowel/Bladder  Patient will continue to be continent of bowel and bladder during admission  Transfers  modified independent  Locomotion  supervision household gait w/ LRAD  Communication     Cognition     Pain  Patient will be pain free or pain less than 3 during admission  Safety/Judgment  Patient will be free from falls and adhere to safety plan during admission   Therapy Plan: PT Intensity: Minimum of 1-2 x/day ,45 to 90 minutes PT Frequency: 5 out of 7 days PT Duration Estimated Length of Stay: 10-14 days OT Intensity: Minimum of 1-2 x/day, 45 to 90 minutes OT Frequency: 5 out of 7 days OT Duration/Estimated Length of Stay: 12-14 days      Team Interventions: Nursing Interventions Patient/Family Education, Disease Management/Prevention, Psychosocial Support, Medication Management  PT interventions Ambulation/gait training, Disease management/prevention, Pain management, Stair training,  Visual/perceptual remediation/compensation, Wheelchair propulsion/positioning, Therapeutic Activities, Patient/family education, Fish farm manager, Warden/ranger, Functional electrical stimulation, Psychosocial support, Therapeutic Exercise, Community reintegration, Functional mobility training, Skin care/wound management, UE/LE Strength taining/ROM, UE/LE Coordination activities, Splinting/orthotics, Neuromuscular re-education, Discharge planning  OT Interventions Warden/ranger, Community reintegration, Disease mangement/prevention, Neuromuscular re-education, Patient/family education, Self Care/advanced ADL retraining, Therapeutic Exercise, UE/LE Coordination activities, Wheelchair propulsion/positioning, Visual/perceptual remediation/compensation, UE/LE Strength taining/ROM, Therapeutic Activities, Psychosocial support, Pain management, Functional mobility training, DME/adaptive equipment instruction, Discharge planning  SLP Interventions    TR Interventions    SW/CM Interventions Discharge Planning, Psychosocial Support, Patient/Family Education   Barriers to Discharge MD  Medical stability and Home enviroment access/loayout  Nursing      PT Home environment access/layout 12 steps to get to bathroom/bedroom on 2nd floor of apartment  OT Home environment access/layout, Medical stability(17 steps to 2nd level)    SLP      SW       Team Discharge Planning: Destination: PT-  ,OT- Home , SLP-  Projected Follow-up: PT-Home health PT(OPPT if parents can safely drive pt to/from PT), OT-  Home health OT, SLP-  Projected Equipment Needs: PT-To be determined, OT- Tub/shower bench, To be determined, SLP-  Equipment Details: PT- , OT-  Patient/family involved in discharge planning: PT- Patient,  OT-Patient, SLP-   MD ELOS: 12-18d Medical Rehab Prognosis:  Good Assessment:  53 year old right-handed male with history of CAD status post PCI maintained on  Brilinta/aspirin present but noncompliant, tobacco/polysubstance abuse, hypertension maintained on Norvasc 5 mg twice daily, Lopressor 25  mg twice a day. Per chart review patient lives with 56 year old daughter. Two-level home. Independent prior to admission.Presented 07/04/2018 with dyspnea, nausea and intermittent shortness of breath as well as numerous falls. He was recently seen in the ED for a viral illness placed on antibiotic therapy and discharged home. CT of the head showed right cerebellar infarction likely subacute no hemorrhage. Patient did not receive TPA.Urine drug screen positive for cocaine. CT angiogram of head and neck with no large vessel occlusion. 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 No mass effect. Echocardiogram with ejection fraction of 65% hyperdynamic systolic function. Neurology follow-up currently on aspirin and Plavix for CVA prophylaxis. Patient's Brilinta has been discontinued.   Now requiring 24/7 Rehab RN,MD, as well as CIR level PT, OT and SLP.  Treatment team will focus on ADLs and mobility with goals set at [facility] I/Sup See Team Conference Notes for weekly updates to the plan of care

## 2018-07-10 NOTE — Progress Notes (Signed)
Physical Therapy Session Note  Patient Details  Name: Kenneth Morales MRN: [id number] Date of Birth: 14-Nov-1965  Today's Date: 07/10/2018 PT Individual Time: 0802-0912 PT Individual Time Calculation (min): 70 min   Short Term Goals: Week 1:  PT Short Term Goal 1 (Week 1): Pt will ambulate 56' w/ CGA w/ LRAD PT Short Term Goal 2 (Week 1): Pt will negotiate 17 steps w/ CGA w/ L rail only as per home set-up PT Short Term Goal 3 (Week 1): Pt will maintain dynamic standing balance w/ min assist w/o UE support  Skilled Therapeutic Interventions/Progress Updates:    Pt supine in bed upon PT arrival, agreeable to therapy tx and denies pain. Pt c/o hiccups keeping him up at night. Pt transferred to sitting with supervision and donned shoes with supervision. Pt ambulated to gym with RW and CGA 150 ft and verbal cues for upright posture. Pt worked on gait without AD this session, ambulated 2 x 100 ft without AD and min assist. Pt worked on dynamic standing balance to perform side stepping and backwards ambulation x 2 trials each with min assist. Pt ambulated 2 x 150 ft without AD and CGA. Pt ambulated to unit<>atrium 2 x 250 ft without AD, CGA. Pt played piano this session working on UE fine motor coordination. Ambulated to sink to blow nose and wash hands. Pt worked on dynamic standing balance to perform toe taps to colored cones, standing on airex throwing ball against rebound trampoline, and shooting basketball, all with CGA-min assist for balance. Pt ambulated back to room and left supine in bed with needs in reach and bed alarm set.   Therapy Documentation Precautions:  Precautions Precautions: Fall Restrictions Weight Bearing Restrictions: No   Therapy/Group: Individual Therapy  Cresenciano Genre, PT, DPT 07/10/2018, 7:52 AM

## 2018-07-10 NOTE — Progress Notes (Signed)
Less hiccups noted after taking Baclofen dose this pm, implosive behavior at times attempting to get himself out of bed instead of waiting on staff to arrive, encourage to adhere to safety precautions to prevent any falls and states he understood, Monitor closely, bed alarm on while in bed and chair alarm while up in W/C., Constantly remains hungry during shift even when he has consumed several po snacks. Monitor and assisted

## 2018-07-10 NOTE — Progress Notes (Signed)
[place] PHYSICAL MEDICINE & REHABILITATION PROGRESS NOTE   Subjective/Complaints:  Patient is complaining of continued hiccups.  He has been on both Thorazine as well as baclofen.  His baclofen dose was increased to 10 mg over the weekend.  He states it is still ineffective.  He takes it 3 times a day.  We discussed this with nursing staff.  Patient states that hiccups make it difficult for him to talk on the phone and also interfere with sleep. ROS: Patient denies chest pain, shortness of breath, nausea vomiting diarrhea or constipation.   Objective:   No results found. Recent Labs    07/07/18 1435 07/10/18 0631  WBC 8.1 8.4  HGB 14.1 15.4  HCT 40.8 44.1  PLT 219 272   Recent Labs    07/07/18 1435 07/10/18 0631  NA  --  138  K  --  4.1  CL  --  106  CO2  --  25  GLUCOSE  --  195*  BUN  --  18  CREATININE 1.00 0.92  CALCIUM  --  9.1    Intake/Output Summary (Last 24 hours) at 07/10/2018 0940 Last data filed at 07/10/2018 0715 Gross per 24 hour  Intake 960 ml  Output 1100 ml  Net -140 ml     Physical Exam: Vital Signs Blood pressure 120/86, pulse 72, temperature 98.8 F (37.1 C), temperature source Oral, resp. rate 18, height 5\' 10"  (1.778 m), weight 68.3 kg, SpO2 99 %. Constitutional: No distress . Vital signs reviewed. HEENT: EOMI, oral membranes moist, frequent eructations Neck: supple Cardiovascular: RRR without murmur. No JVD    Respiratory: CTA Bilaterally without wheezes or rales. Normal effort    GI: BS +, non-tender, non-distended  Musculoskeletal:Normal range of motion.  General: No deformityor edema.  Neurological: He isalertand oriented to person, place, and time. Diplopia. No nystagmus. Gaze not grossly dysconjugate. Notes decreased sensation to palm and fingers of left hand, slight tingling fingers right hand. Motor 4+/5 in all 4 limbs proximal to distal.  No evidence of dysmetria Sitting balance is good Skin: Skin iswarm.   Psychiatric: pleasant and cooperative     Assessment/Plan: 1. 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  Physiatrist is providing close team supervision and 24 hour management of active medical problems listed below.  Physiatrist and rehab team continue to assess barriers to discharge/monitor patient progress toward functional and medical goals  Care Tool:  Bathing    Body parts bathed by patient: Right arm, Left arm, Chest, Abdomen, Front perineal area, Buttocks, Right upper leg, Left upper leg, Right lower leg, Left lower leg         Bathing assist Assist Level: Set up assist     Upper Body Dressing/Undressing Upper body dressing Upper body dressing/undressing activity did not occur (including orthotics): Refused What is the patient wearing?: Pull over shirt    Upper body assist Assist Level: Independent    Lower Body Dressing/Undressing Lower body dressing    Lower body dressing activity did not occur: Refused What is the patient wearing?: Pants     Lower body assist Assist for lower body dressing: Independent     Toileting Toileting Toileting Activity did not occur (Clothing management and hygiene only): N/A (no void or bm)  Toileting assist Assist for toileting: Supervision/Verbal cueing     Transfers Chair/bed transfer  Transfers assist  Chair/bed transfer activity did not occur:  Safety/medical concerns  Chair/bed transfer assist level: Supervision/Verbal cueing     Locomotion Ambulation   Ambulation assist      Assist level: Maximal Assistance - Patient 25 - 49% Assistive device: Other (comment)(none) Max distance: 25'   Walk 10 feet activity   Assist     Assist level: Maximal Assistance - Patient 25 - 49% Assistive device: Other (comment)(none)   Walk 50 feet activity   Assist Walk 50 feet with 2 turns activity did  not occur: Safety/medical concerns         Walk 150 feet activity   Assist Walk 150 feet activity did not occur: Safety/medical concerns         Walk 10 feet on uneven surface  activity   Assist Walk 10 feet on uneven surfaces activity did not occur: Safety/medical concerns         Wheelchair     Assist Will patient use wheelchair at discharge?: No   Wheelchair activity did not occur: N/A         Wheelchair 50 feet with 2 turns activity    Assist    Wheelchair 50 feet with 2 turns activity did not occur: N/A       Wheelchair 150 feet activity     Assist Wheelchair 150 feet activity did not occur: N/A        Medical Problem List and Plan: 1.Decreased functional mobility with dizzinesssecondary to multifocal acute infarct right PICA territory and 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 Pt with ongoing diplopia, hiccups, balance and coordination deficits. --Continue CIR therapies including PT, OT  -change baclofen to 15mg  tid SCHEDULED for hiccups, as discussed with the patient may have to go up to 20 mg if this is not effective.  Monitor for sedation 2. Antithrombotics: -DVT/anticoagulation:Subcutaneous Lovenox -antiplatelet therapy: aspirin 81 mg daily, Plavix 75 mg daily 3. Pain Management:Tylenol as needed 4. Mood:Provide emotional support -antipsychotic agents: None 5. Neuropsych: This patientiscapable of making decisions on hisown behalf. 6. Skin/Wound Care:Routine skin checks 7. Fluids/Electrolytes/Nutrition:Routine ins and outs, metabolic package today was essentially normal 8. Lopressor 25 mg twice a day, Norvasc 5 mg daily.    Vitals:   07/10/18 0634 07/10/18 0929  BP: 118/88 120/86  Pulse: 69 72  Resp: 18   Temp: 98.8 F (37.1 C)   SpO2: 99%   Controlled, 07/10/2018 9. History of CAD with myocardial infarction/stenting. Continue aspirin and Plavix. Patient was noncompliant  with Brilinta. Continue Ranexa 500 mg twice a day 10. Hyperlipidemia. Lipitor 11. History of tobacco polysubstance abuse. Urine drug screen positive for cocaine. Provide counseling 12. Questionable medical noncompliance. Provide counseling 13. Constipation. Laxative assistance, had bm 3/13  14.  Elevated blood sugar we will check hemoglobin A1c  LOS: 3 days A FACE TO FACE EVALUATION WAS PERFORMED  Erick Colace 07/10/2018, 9:40 AM

## 2018-07-10 NOTE — Progress Notes (Signed)
Occupational Therapy Session Note  Patient Details  Name: Kenneth Morales MRN: [id number] Date of Birth: [birth year]  Today's Date: 07/10/2018 OT Individual Time: 1105-1201 OT Individual Time Calculation (min): 56 min    Short Term Goals: Week 1:  OT Short Term Goal 1 (Week 1): Pt will complete ambulatory transfer to toilet with mod vcs for RW safety OT Short Term Goal 2 (Week 1): Pt will stand for 10 minutes during a functional activity to improve standing endurance   Skilled Therapeutic Interventions/Progress Updates:    Pt declined bathing stating he did it yesterday evening.  He was able to sit EOB with independence and donn his shoes with setup only. He then ambulated to the dayroom for work on dynamic standing balance.  Min assist for ambulation without assistive device, secondary to LOB to the right.  Had pt stand with use of the Wii, having him take a staggered step forward with the LUE while playing the game.  He exhibited LOB 30% of the time to the right side.  Once completed, had pt ambulate down to the piano on the first floor with min assist to min guard where he played the piano working on BUE coordination for a few mins before returning back to the room.  Min guard for ambulation back to the room where he completed toileting task in standing with min guard assist.  Pt left sitting EOB in preparation for lunch with call button and phone in reach and bed alarm in place.    Therapy Documentation Precautions:  Precautions Precautions: Fall Restrictions Weight Bearing Restrictions: No  Pain: Pain Assessment Pain Scale: Faces Pain Score: 0-No pain ADL: See Care Tool Section for some details of ADL  Therapy/Group: Individual Therapy  Getsemani Lindon OTR/L 07/10/2018, 12:16 PM

## 2018-07-10 NOTE — Progress Notes (Signed)
Physical Therapy Session Note  Patient Details  Name: Kenneth Morales MRN: [id number] Date of Birth: 04/13/1966  Today's Date: 07/10/2018 PT Individual Time: 1315-1400 PT Individual Time Calculation (min): 45 min  Pt missed 15 minutes of treatment as he was eating lunch.  Short Term Goals: Week 1:  PT Short Term Goal 1 (Week 1): Pt will ambulate 54' w/ CGA w/ LRAD PT Short Term Goal 2 (Week 1): Pt will negotiate 17 steps w/ CGA w/ L rail only as per home set-up PT Short Term Goal 3 (Week 1): Pt will maintain dynamic standing balance w/ min assist w/o UE support  Skilled Therapeutic Interventions/Progress Updates:   Pt sitting up in bed, eating lunch.  He requested PT come back in 15 minutes.  PT returned.  Pt sitting EOB with family present.  Sit> stand with supervision.    Gait from room to gym, without AD with supervision, 150'.  Pt rested on mat ;  mild LOB upon sit to stand, recovered indepedently. Balance challenge and sustained stretch bil heel cords, standing with forefeet on wedge, without UE support, moving UEs in a pattern for further challenge.   Mild sway noted; no need for assistance to recover balance.  Terminal hip extension x 10 in tall kneeling.  Core activition for tall kneeling > 1/2 kneeling with R/L knee up, x 1 minute each, without UE support, no LOB.   Patient demonstrates increased fall risk as noted by score of 40 /56 on Berg Balance Scale.  (<36= high risk for falls, close to 100%; 37-45 significant >80%; 46-51 moderate >50%; 52-55 lower >25%).    Gait training to return to room, kicking a Yoga block with R/L foot x 50', supervision.  Pt left resting, sitting on bed, with alarm set and needs at hand.  Kriste Basque, CSW present.      Therapy Documentation Precautions:  Precautions Precautions: Fall Restrictions Weight Bearing Restrictions: No Therapy Vitals Temp: 98.6 F (37 C) Temp Source: Oral Pulse Rate: 64 Resp: 18 BP: 108/70 Patient Position (if  appropriate): Lying Oxygen Therapy SpO2: 99 % O2 Device: Room Air Pain: pt denies      Balance: Standardized Balance Assessment Standardized Balance Assessment: Berg Balance Test Berg Balance Test Sit to Stand: Able to stand without using hands and stabilize independently Standing Unsupported: Able to stand safely 2 minutes Sitting with Back Unsupported but Feet Supported on Floor or Stool: Able to sit safely and securely 2 minutes Stand to Sit: Sits safely with minimal use of hands Transfers: Able to transfer safely, minor use of hands Standing Unsupported with Eyes Closed: Able to stand 10 seconds with supervision Standing Ubsupported with Feet Together: Able to place feet together independently and stand for 1 minute with supervision From Standing, Reach Forward with Outstretched Arm: Can reach confidently >25 cm (10") From Standing Position, Pick up Object from Floor: Able to pick up shoe, needs supervision From Standing Position, Turn to Look Behind Over each Shoulder: Needs supervision when turning Turn 360 Degrees: Needs close supervision or verbal cueing Standing Unsupported, Alternately Place Feet on Step/Stool: Able to complete >2 steps/needs minimal assist Standing Unsupported, One Foot in Front: Able to take small step independently and hold 30 seconds Standing on One Leg: Tries to lift leg/unable to hold 3 seconds but remains standing independently Total Score: 39    Therapy/Group: Individual Therapy  Keyairra Kolinski 07/10/2018, 4:08 PM

## 2018-07-10 NOTE — Progress Notes (Signed)
Inpatient Rehabilitation  Patient information reviewed and entered into [facility] system by Pranavi Aure M. Ralpheal Zappone, M.A., CCC/SLP, PPS Coordinator.  Information including medical coding, functional ability and quality indicators will be reviewed and updated through discharge.    

## 2018-07-11 ENCOUNTER — Inpatient Hospital Stay (HOSPITAL_COMMUNITY): Payer: Medicaid Other | Admitting: Occupational Therapy

## 2018-07-11 ENCOUNTER — Inpatient Hospital Stay (HOSPITAL_COMMUNITY): Payer: Self-pay

## 2018-07-11 ENCOUNTER — Inpatient Hospital Stay (HOSPITAL_COMMUNITY): Payer: Self-pay | Admitting: Physical Therapy

## 2018-07-11 LAB — HEMOGLOBIN A1C
Hgb A1c MFr Bld: 5.5 % (ref 4.8–5.6)
Mean Plasma Glucose: 111.15 mg/dL

## 2018-07-11 MED ORDER — METOPROLOL TARTRATE 12.5 MG HALF TABLET
12.5000 mg | ORAL_TABLET | Freq: Two times a day (BID) | ORAL | Status: DC
  Administered 2018-07-11 – 2018-07-12 (×2): 12.5 mg via ORAL
  Filled 2018-07-11 (×2): qty 1

## 2018-07-11 NOTE — Progress Notes (Addendum)
PHYSICAL MEDICINE & REHABILITATION PROGRESS NOTE   Subjective/Complaints: Left side feels funny, sensitve vs right   Has had >21mmHg drop in systolic BP in PT with dizziness ROS: Patient denies chest pain, shortness of breath, nausea vomiting diarrhea or constipation.   Objective:   No results found. Recent Labs    07/10/18 0631  WBC 8.4  HGB 15.4  HCT 44.1  PLT 272   Recent Labs    07/10/18 0631  NA 138  K 4.1  CL 106  CO2 25  GLUCOSE 195*  BUN 18  CREATININE 0.92  CALCIUM 9.1    Intake/Output Summary (Last 24 hours) at 07/11/2018 0858 Last data filed at 07/11/2018 0658 Gross per 24 hour  Intake 460 ml  Output 400 ml  Net 60 ml     Physical Exam: Vital Signs Blood pressure (!) 129/97, pulse 63, temperature 98.1 F (36.7 C), temperature source Oral, resp. rate 17, height 5\' 10"  (1.778 m), weight 68.3 kg, SpO2 100 %. Constitutional: No distress . Vital signs reviewed. HEENT: EOMI, oral membranes moist, frequent eructations Neck: supple Cardiovascular: RRR without murmur. No JVD    Respiratory: CTA Bilaterally without wheezes or rales. Normal effort    GI: BS +, non-tender, non-distended  Musculoskeletal:Normal range of motion.  General: No deformityor edema.  Neurological: He isalertand oriented to person, place, and time. Diplopia. No nystagmus. Gaze not grossly dysconjugate. Notes decreased sensation to palm and fingers of left hand,arm,trunk and leg. Motor 4+/5 in all 4 limbs proximal to distal.  No evidence of dysmetria Sitting balance is good Skin: Skin iswarm.  Psychiatric: pleasant and cooperative     Assessment/Plan: 1. 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  Physiatrist is providing close team supervision and 24 hour management of active medical problems listed below.  Physiatrist and rehab team  continue to assess barriers to discharge/monitor patient progress toward functional and medical goals  Care Tool:  Bathing    Body parts bathed by patient: Right arm, Left arm, Chest, Abdomen, Front perineal area, Buttocks, Right upper leg, Left upper leg, Right lower leg, Left lower leg         Bathing assist Assist Level: Set up assist     Upper Body Dressing/Undressing Upper body dressing Upper body dressing/undressing activity did not occur (including orthotics): Refused What is the patient wearing?: Pull over shirt    Upper body assist Assist Level: Independent    Lower Body Dressing/Undressing Lower body dressing    Lower body dressing activity did not occur: Refused What is the patient wearing?: Underwear/pull up, Pants     Lower body assist Assist for lower body dressing: Independent     Toileting Toileting Toileting Activity did not occur (Clothing management and hygiene only): N/A (no void or bm)  Toileting assist Assist for toileting: Supervision/Verbal cueing     Transfers Chair/bed transfer  Transfers assist  Chair/bed transfer activity did not occur: Safety/medical concerns  Chair/bed transfer assist level: Contact Guard/Touching assist     Locomotion Ambulation   Ambulation assist      Assist level: Minimal Assistance - Patient > 75% Assistive device: Other (comment)(none) Max distance: 300 ft   Walk 10 feet activity   Assist     Assist level: Minimal Assistance - Patient > 75% Assistive device: Other (comment)(none)   Walk 50 feet activity   Assist Walk 50 feet with 2 turns activity  did not occur: Safety/medical concerns  Assist level: Minimal Assistance - Patient > 75%      Walk 150 feet activity   Assist Walk 150 feet activity did not occur: Safety/medical concerns  Assist level: Minimal Assistance - Patient > 75%      Walk 10 feet on uneven surface  activity   Assist Walk 10 feet on uneven surfaces activity did  not occur: Safety/medical concerns         Wheelchair     Assist Will patient use wheelchair at discharge?: No   Wheelchair activity did not occur: N/A         Wheelchair 50 feet with 2 turns activity    Assist    Wheelchair 50 feet with 2 turns activity did not occur: N/A       Wheelchair 150 feet activity     Assist Wheelchair 150 feet activity did not occur: N/A        Medical Problem List and Plan: 1.Decreased functional mobility with dizzinesssecondary to multifocal acute infarct right PICA territory and 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 Pt with ongoing diplopia, hiccups, balance and coordination deficits. --Continue CIR therapies including PT, OT Team conf in am, 2. Antithrombotics: -DVT/anticoagulation:Subcutaneous Lovenox -antiplatelet therapy: aspirin 81 mg daily, Plavix 75 mg daily 3. 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 Mood:Provide emotional support -antipsychotic agents: None 5. Neuropsych: This patientiscapable of making decisions on hisown behalf. 6. Skin/Wound Care:Routine skin checks 7. Fluids/Electrolytes/Nutrition:Routine ins and outs, metabolic package today was essentially normal 8. Lopressor 25 mg twice a day, Norvasc 5 mg daily.    Vitals:   07/11/18 0602 07/11/18 0852  BP: 116/84 (!) 129/97  Pulse: 63   Resp: 17   Temp: 98.1 F (36.7 C)   SpO2: 100%   Controlled, 07/11/2018 but having orthostatic changes, start TEDs and binder , adjust BP meds, also may be thorazine related 9. History of CAD with myocardial infarction/stenting. Continue aspirin and Plavix. Patient was noncompliant with Brilinta. Continue Ranexa 500 mg twice a day 10. Hyperlipidemia. Lipitor 11. History of tobacco polysubstance abuse. Urine drug screen positive for cocaine. Provide counseling 12. Questionable medical noncompliance. Provide counseling 13.  Constipation. Laxative assistance, had bm 3/13  14.  Elevated blood sugar we will check hemoglobin A1c 15.  Intractible hiccups -change baclofen to 20mg  tid SCHEDULED for hiccups LOS: 4 days A FACE TO FACE EVALUATION WAS PERFORMED  Erick Colace 07/11/2018, 8:58 AM

## 2018-07-11 NOTE — Progress Notes (Signed)
Nutrition Brief Note  Discussed pt with RN who asked RD to order snacks/double portions for pt due to pt still feeling hungry after meals.  RD ordered double protein portions TID with meals and snacks TID between meals via [employer] diet software.  Wt Readings from Last 15 Encounters:  07/07/18 68.3 kg  07/04/18 68.3 kg  07/02/18 68 kg  03/28/18 70.3 kg  01/28/18 68.9 kg  01/26/18 72.6 kg  11/30/17 70.4 kg  02/22/17 74.8 kg  07/01/16 74 kg  09/23/15 72.6 kg  06/27/15 72.6 kg  01/14/15 74.8 kg  12/16/14 74.8 kg  12/15/14 74.8 kg  02/15/12 71.2 kg    Body mass index is 21.61 kg/m. Patient meets criteria for normal weight based on current BMI.   Current diet order is Heart Healthy, patient is consuming approximately 100% of meals at this time. Labs and medications reviewed.   No additional nutrition interventions warranted at this time. If nutrition issues arise, please consult RD.    Earma Reading, MS, RD, LDN Inpatient Clinical Dietitian Pager: [phone] Weekend/After Hours: [phone]

## 2018-07-11 NOTE — Progress Notes (Signed)
Occupational Therapy Session Note  Patient Details  Name: Kenneth Morales MRN: [id number] Date of Birth: Jul 13, 1965  Today's Date: 07/11/2018 OT Individual Time: 4888-9169 OT Individual Time Calculation (min): 54 min    Short Term Goals: Week 1:  OT Short Term Goal 1 (Week 1): Pt will complete ambulatory transfer to toilet with mod vcs for RW safety OT Short Term Goal 2 (Week 1): Pt will stand for 10 minutes during a functional activity to improve standing endurance   Skilled Therapeutic Interventions/Progress Updates:    Pt with decreased BP during session and prior to session per PT report.  BP taken in supine at 111/72.  It decreased down to 99/73 with sitting EOB.  In standing it was at 94/74 with pt reporting slight dizziness.  He did agree to work on bathing and dressing during session based on decreased symptoms.  Transfers to and from the shower were completed with min assist and no assistive device.  He bathed sit to stand with supervision as well as completed dressing at the same level.  He was able to donn his TEDs without assist as well.  BP taken again sitting EOB after shower at 122/91.  He transferred to the wheelchair with supervision and completed session with grooming tasks from wheelchair level.  Pt left in wheelchair with safety alarm belt in place and pt rolling up and down the hallway for exercise.  Enquired with nursing pt's wishes to get a grounds pass when his mother comes so he can go outside.    Therapy Documentation Precautions:  Precautions Precautions: Fall Restrictions Weight Bearing Restrictions: No   Vital Signs: Therapy Vitals Pulse Rate: 64 BP: (!) 122/91 Patient Position (if appropriate): Sitting Pain: Pain Assessment Pain Scale: Faces Pain Score: 0-No pain ADL: See Care Tool Section for some details of ADL  Therapy/Group: Individual Therapy  Rannie Craney OTR/L 07/11/2018, 12:50 PM

## 2018-07-11 NOTE — Progress Notes (Signed)
Physical Therapy Session Note  Patient Details  Name: Kenneth Morales MRN: [id number] Date of Birth: Sep 13, 1965  Today's Date: 07/11/2018 PT Individual Time: 5852-7782 PT Individual Time Calculation (min): 72 min   Short Term Goals: Week 1:  PT Short Term Goal 1 (Week 1): Pt will ambulate 28' w/ CGA w/ LRAD PT Short Term Goal 2 (Week 1): Pt will negotiate 17 steps w/ CGA w/ L rail only as per home set-up PT Short Term Goal 3 (Week 1): Pt will maintain dynamic standing balance w/ min assist w/o UE support  Skilled Therapeutic Interventions/Progress Updates:    Pt seated in w/c upon PT arrival, agreeable to therapy tx and denies pain. Pt reports low BP this morning, propelled w/c to the gym x 200 ft with supervision. BP checked in sitting 143/87. Pt requesting to walk back to room to get shoes, ambulated 2 x 200 ft from room<>gym with CGA and no AD. BP checked after activity in sitting 144/107.  Pt worked on dynamic standing balance while ambulating in hallway working on changes in gait speed, sudden stops and head turns, CGA. BP checked per pt request 136/102. Pt performed 2 x 15 squats for LE strengthening, cues for techniques. Pt worked on dynamic standing balance while performing boxing activity using punching bag with CGA. Pt ambulated to the cafeteria and worked on community distance ambulation and ambulation in busy environment while avoiding obstacles, CGA. Pt worked on fine motor coordination in order to open/close small containers and fill with sauces. Pt ambulated back to gym and worked on dynamic balance while stepping on compliant surfaces, stepping over objects and weaving between cones, CGA. Pt ambulated back to room and left seated EOB with needs in reach and bed alarm set.   Therapy Documentation Precautions:  Precautions Precautions: Fall Restrictions Weight Bearing Restrictions: No   Therapy/Group: Individual Therapy  Cresenciano Genre, PT, DPT 07/11/2018, 8:00 AM

## 2018-07-11 NOTE — Progress Notes (Signed)
Physical Therapy Session Note  Patient Details  Name: Kenneth Morales MRN: [id number] Date of Birth: 1965/05/24  Today's Date: 07/11/2018 PT Individual Time: 0800-0858 PT Individual Time Calculation (min): 58 min   Short Term Goals: Week 1:  PT Short Term Goal 1 (Week 1): Pt will ambulate 28' w/ CGA w/ LRAD PT Short Term Goal 2 (Week 1): Pt will negotiate 17 steps w/ CGA w/ L rail only as per home set-up PT Short Term Goal 3 (Week 1): Pt will maintain dynamic standing balance w/ min assist w/o UE support  Skilled Therapeutic Interventions/Progress Updates:    Pt received asleep in bed and agreeable to therapy upon wakening despite reporting hiccups continuing to keep him awake at night. Pt ambulated ~240ft to therapy gym without AD with CGA for steadying. Pt performed sit<>stand transfers with supervision/CGA throughout therapy session. Pt performed alternating foot taps on step progressed to stepping up/down all with CGA for steadying as needed. Performed ladder drills of forward alternating stepping, lateral sidestepping, and backwards ambulation with alternating leading foot - 3 instances of LOB requiring mod A for recovery - rolled R ankle during lateral stepping requiring mod A for recovery but pt denied pain stating he has "bad ankles" and pt denies pain for remainder of therapy session. Pt performed ambulation in hallway with sudden turn and stop facing R or L based on verbal cuing progressed to performing full turn 180 degrees and stop with CGA provided throughout for steadying/safety but no instances of LOB. Performed shooting basketball with pt recoverying ball with CGA provided throughout and min A for balance when reaching down for ball with pt using UE support on the counter to provide additional balance support. After shooting for ~81minutes pt went to shoot the ball again and suddenly reported significant onset of dizziness requiring max A and use of wall to prevent L lateral LOB -  therapist provided chair for seated rest break and pt reported he felt that his BP was low stating "I do this when it gets low." BP assessed in sitting to be 105/76 (MAP 86), HR 66bpm, reassessed 4 minutes later in sitting BP 99/75 (MAP 84), HR 67bpm, and again 3 minutes later in sitting BP 94/78 (MAP 85), HR 68bpm while another therapist retrieved pt's wheelchair. Therapist transported pt to room and performed squat pivot to bed with CGA and performed sit to supine with supervision and vitals reassessed in supine BP 129/97 (MAP 105), HR 64bpm - PA, RN, and MD notified of pt's vitals during therapy - PA and MD advised pt wear TED hose and  abdominal binder for standing activities. Pt left supine in bed with needs in reach and bed alarm on.   Therapy Documentation Precautions:  Precautions Precautions: Fall Restrictions Weight Bearing Restrictions: No  Pain: No reports of pain during therapy session.    Therapy/Group: Individual Therapy  Ginny Forth, PT, DPT 07/11/2018, 7:51 AM

## 2018-07-12 ENCOUNTER — Inpatient Hospital Stay (HOSPITAL_COMMUNITY): Payer: Self-pay | Admitting: Physical Therapy

## 2018-07-12 ENCOUNTER — Inpatient Hospital Stay (HOSPITAL_COMMUNITY): Payer: Medicaid Other | Admitting: Occupational Therapy

## 2018-07-12 ENCOUNTER — Encounter (HOSPITAL_COMMUNITY): Payer: Medicaid Other | Admitting: Psychology

## 2018-07-12 ENCOUNTER — Inpatient Hospital Stay (HOSPITAL_COMMUNITY): Payer: Self-pay

## 2018-07-12 MED ORDER — AMLODIPINE BESYLATE 2.5 MG PO TABS
2.5000 mg | ORAL_TABLET | Freq: Every day | ORAL | Status: DC
  Administered 2018-07-13: 2.5 mg via ORAL
  Filled 2018-07-12 (×2): qty 1

## 2018-07-12 MED ORDER — BACLOFEN 20 MG PO TABS
20.0000 mg | ORAL_TABLET | Freq: Four times a day (QID) | ORAL | Status: DC
  Administered 2018-07-12 – 2018-07-15 (×12): 20 mg via ORAL
  Filled 2018-07-12 (×13): qty 1

## 2018-07-12 NOTE — Progress Notes (Signed)
Occupational Therapy Session Note  Patient Details  Name: Kenneth Morales MRN: [id number] Date of Birth: [birth year]  Today's Date: 07/12/2018 OT Individual Time: 1305-1400 OT Individual Time Calculation (min): 55 min    Short Term Goals: Week 1:  OT Short Term Goal 1 (Week 1): Pt will complete ambulatory transfer to toilet with mod vcs for RW safety OT Short Term Goal 2 (Week 1): Pt will stand for 10 minutes during a functional activity to improve standing endurance   Skilled Therapeutic Interventions/Progress Updates:     Pt completed donning gripper socks and shoes EOB with supervision to start session.  He then ambulated to the bathroom with min guard assist for blowing his nose as well as for washing his hands at the sink with supervision.  He was able to ambulate down to the tub/shower room where he completed performance of a tub transfer with close supervision, stepping into the tub and out.  Discussed the need for a shower seat for safety at discharge and pt voices understanding.  Next had pt ambulate to the therapy gym where rest of session focused on dynamic standing balance.  Had pt work on standing tandem with min guard assist and occasional LOB to the right.  Pt able to demonstrate slight delayed stepping strategy to correct.  He then completed alternate toe taps to 4" step with supervision as well.  He then worked on simulated donning of pants in standing by using therapy band to step into and out of.  Min assist needed for balance with this task.  Finished session with functional mobility back to his room with min guard assist.  Pt left sitting EOB with bed alarm on eating his lunch.    Therapy Documentation Precautions:  Precautions Precautions: Fall Restrictions Weight Bearing Restrictions: No   Pain: Pain Assessment Pain Scale: Faces Pain Score: 0-No pain ADL: See Care Tool for some details of ADL  Therapy/Group: Individual Therapy  Jash Wahlen OTR/L 07/12/2018,  3:46 PM

## 2018-07-12 NOTE — Progress Notes (Signed)
Physical Therapy Session Note  Patient Details  Name: Kenneth Morales MRN: [id number] Date of Birth: [birth year]  Today's Date: 07/12/2018 PT Individual Time: 0315-9458 PT Individual Time Calculation (min): 75 min   Short Term Goals: Week 1:  PT Short Term Goal 1 (Week 1): Pt will ambulate 21' w/ CGA w/ LRAD PT Short Term Goal 2 (Week 1): Pt will negotiate 17 steps w/ CGA w/ L rail only as per home set-up PT Short Term Goal 3 (Week 1): Pt will maintain dynamic standing balance w/ min assist w/o UE support  Skilled Therapeutic Interventions/Progress Updates:    Pt received supine asleep in bed and agreeable to therapy session. Performed supine to sit mod-I. Vitals assessed in supine to be BP 133/94, HR 63bpm, in sitting BP 116/90 HR 67bpm with noticed decrease in BP and donned TED hose, and in standing BP 125/84 HR 71bpm a few minutes later reported onset of dizziness upon initiating gait and vitals reassessed to be  BP 107/81 HR 69bpm - seated rest break provided and symptoms alleviated in <1 minute. MD notified of orthostatic hypotension. Pt performed seated dressing at EOB with supervision for safety. Performed sit<>stand throughout session with supervision/CGA for safety due to pt having orthostatic hypotension with reports of dizziness. Therapy session focused on activity tolerance with standing activities.   Pt performed w/c mobility mod-I using B UE to gym in w/c for pt safety. Reassessed positional vitals: sitting BP 138/81 HR 60bpm, standing BP 111/78 HR 66bpm, after ambulating 126ft without AD with CGA for safety/steadying BP 116/87 HR 65. Ambulated 157ft without AD with CGA for safety/steadying while performing ball toss focusing on dual task and balance - vitals reassessed in standing BP 100/83 HR 70bpm with pt reporting symptoms. Performed seated ball toss activity with supervision and vitals reassessed BP 115/94 HR 66bpm in sitting. Performed ascending/descending 8 steps (6") with  unilateral handrail support and CGA for steadying and cuing for safety with initial step-to progressed to reciprocal pattern with no increased unsteadiness. Reassessed vitals after stair navigation BP 125/83 HR 67bpm. Pt performed w/c mobility to room mod-I using BLEs for pt safety. Pt left sitting in w/c with needs in reach and seat alarm on.  Therapy Documentation Precautions:  Precautions Precautions: Fall Restrictions Weight Bearing Restrictions: No General:   Vital Signs: Assessed throughout therapy session as reported above  Pain: No reports of pain during session    Therapy/Group: Individual Therapy  Ginny Forth, PT, DPT 07/12/2018, 9:55 AM

## 2018-07-12 NOTE — Progress Notes (Signed)
[place] PHYSICAL MEDICINE & REHABILITATION PROGRESS NOTE   Subjective/Complaints:  Hiccups interfere with sleep BP drops mainly in the am ROS: Patient denies chest pain, shortness of breath, nausea vomiting diarrhea or constipation.   Objective:   No results found. Recent Labs    07/10/18 0631  WBC 8.4  HGB 15.4  HCT 44.1  PLT 272   Recent Labs    07/10/18 0631  NA 138  K 4.1  CL 106  CO2 25  GLUCOSE 195*  BUN 18  CREATININE 0.92  CALCIUM 9.1    Intake/Output Summary (Last 24 hours) at 07/12/2018 1006 Last data filed at 07/12/2018 0900 Gross per 24 hour  Intake 720 ml  Output 400 ml  Net 320 ml     Physical Exam: Vital Signs Blood pressure 107/81, pulse 69, temperature 98.4 F (36.9 C), resp. rate 18, height '5\' 10"'  (1.778 m), weight 68.3 kg, SpO2 100 %. Constitutional: No distress . Vital signs reviewed. HEENT: EOMI, oral membranes moist, frequent eructations Neck: supple Cardiovascular: RRR without murmur. No JVD    Respiratory: CTA Bilaterally without wheezes or rales. Normal effort    GI: BS +, non-tender, non-distended  Musculoskeletal:Normal range of motion.  General: No deformityor edema.  Neurological: He isalertand oriented to person, place, and time. Diplopia. No nystagmus. Gaze not grossly dysconjugate. Notes decreased sensation to palm and fingers of left hand,arm,trunk and leg. Motor 4+/5 in all 4 limbs proximal to distal.  No evidence of dysmetria Sitting balance is good Skin: Skin iswarm.  Psychiatric: pleasant and cooperative     Assessment/Plan: 1. 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  Physiatrist is providing close team supervision and 24 hour management of active medical problems listed below.  Physiatrist and rehab team continue to assess barriers to discharge/monitor patient progress toward  functional and medical goals  Care Tool:  Bathing    Body parts bathed by patient: Right arm, Left arm, Chest, Abdomen, Front perineal area, Buttocks, Right upper leg, Left upper leg, Right lower leg, Left lower leg         Bathing assist Assist Level: Set up assist     Upper Body Dressing/Undressing Upper body dressing Upper body dressing/undressing activity did not occur (including orthotics): Refused What is the patient wearing?: Pull over shirt    Upper body assist Assist Level: Supervision/Verbal cueing    Lower Body Dressing/Undressing Lower body dressing    Lower body dressing activity did not occur: Refused What is the patient wearing?: Underwear/pull up, Pants     Lower body assist Assist for lower body dressing: Supervision/Verbal cueing     Toileting Toileting Toileting Activity did not occur (Clothing management and hygiene only): N/A (no void or bm)  Toileting assist Assist for toileting: Supervision/Verbal cueing     Transfers Chair/bed transfer  Transfers assist  Chair/bed transfer activity did not occur: Safety/medical concerns  Chair/bed transfer assist level: Contact Guard/Touching assist     Locomotion Ambulation   Ambulation assist      Assist level: Contact Guard/Touching assist Assistive device: Other (comment)(none) Max distance: 200 ft   Walk 10 feet activity   Assist     Assist level: Contact Guard/Touching assist Assistive device: Other (comment)(none)   Walk 50 feet activity   Assist Walk 50 feet with 2 turns activity did not occur: Safety/medical concerns  Assist level: Contact Guard/Touching assist Assistive device: Other (comment)(none)  Walk 150 feet activity   Assist Walk 150 feet activity did not occur: Safety/medical concerns  Assist level: Contact Guard/Touching assist Assistive device: Other (comment)(none)    Walk 10 feet on uneven surface  activity   Assist Walk 10 feet on uneven surfaces  activity did not occur: Safety/medical concerns         Wheelchair     Assist Will patient use wheelchair at discharge?: No   Wheelchair activity did not occur: N/A         Wheelchair 50 feet with 2 turns activity    Assist    Wheelchair 50 feet with 2 turns activity did not occur: N/A       Wheelchair 150 feet activity     Assist Wheelchair 150 feet activity did not occur: N/A        Medical Problem List and Plan: 1.Decreased functional mobility with dizzinesssecondary to multifocal acute infarct right PICA territory and 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 Pt with ongoing diplopia, hiccups, balance and coordination deficits. --Continue CIR therapies including PT, OT Team conference today please see physician documentation under team conference tab, met with team face-to-face to discuss problems,progress, and goals. Formulized individual treatment plan based on medical history, underlying problem and comorbidities., 2. Antithrombotics: -DVT/anticoagulation:Subcutaneous Lovenox -antiplatelet therapy: aspirin 81 mg daily, Plavix 75 mg daily 3. 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 Mood:Provide emotional support -antipsychotic agents: None 5. Neuropsych: This patientiscapable of making decisions on hisown behalf. 6. Skin/Wound Care:Routine skin checks 7. Fluids/Electrolytes/Nutrition:Routine ins and outs, metabolic package today was essentially normal 8. Lopressor 25 mg twice a day, Norvasc 5 mg daily.    Vitals:   07/12/18 0958 07/12/18 1002  BP: 125/84 107/81  Pulse: 71 69  Resp:    Temp:    SpO2:    Controlled, 07/11/2018 but having orthostatic changes, change to thigh hi  TEDs and binder , reduce Norvasc to 2.3, D/C lopressor  also may be thorazine related Worse in the am 9. History of CAD with myocardial infarction/stenting. Continue aspirin and  Plavix. Patient was noncompliant with Brilinta. Continue Ranexa 500 mg twice a day 10. Hyperlipidemia. Lipitor 11. History of tobacco polysubstance abuse. Urine drug screen positive for cocaine. Provide counseling 12. Questionable medical noncompliance. Provide counseling 13. Constipation. Laxative assistance, had bm 3/13  14.  Elevated blood sugar we will check hemoglobin A1c 15.  Intractible hiccups -change baclofen to 21m Qid SCHEDULED for hiccups LOS: 5 days A FACE TO FACE EVALUATION WAS PERFORMED  ACharlett Blake3/18/2020, 10:06 AM

## 2018-07-12 NOTE — Progress Notes (Signed)
Pt OOB unassisted stated that he had sudden urge to "go and I couldn't wait". Educated to need for assistance. Pt was assisted the rest of the way to and from the BR. Pt stated "normally I call and wait but it just came on me".

## 2018-07-12 NOTE — Progress Notes (Signed)
Social Work Patient ID: Kenneth Morales, male   DOB: 22-May-1965, 53 y.o.   MRN: [id number] Met with pt to discuss team conference goals mod/i level and target discharge date 3/21. He is sleepy today due to not sleeping well yesterday. He is aware the MD is working on his BP issues and hiccups. He will await the therapy team recommendations for equipment and follow up.

## 2018-07-12 NOTE — Progress Notes (Signed)
Physical Therapy Session Note  Patient Details  Name: Kenneth Morales MRN: [id number] Date of Birth: [birth year]  Today's Date: 07/12/2018 PT Individual Time: 1505-1600 PT Individual Time Calculation (min): 55 min   Short Term Goals: Week 1:  PT Short Term Goal 1 (Week 1): Pt will ambulate 75' w/ CGA w/ LRAD PT Short Term Goal 2 (Week 1): Pt will negotiate 17 steps w/ CGA w/ L rail only as per home set-up PT Short Term Goal 3 (Week 1): Pt will maintain dynamic standing balance w/ min assist w/o UE support  Skilled Therapeutic Interventions/Progress Updates:    Pt supine in bed upon PT arrival, agreeable to therapy tx and denies pain. Pt transferred to sitting with supervision. BP in sitting 113/62. Pt performed sit<>stand, standing BP 105/80. Pt transferred to w/c and propelled w/c to the gym with supervision. Pt transferred to nustep with CGA and used nustep x 6 minutes on workload 6 for global strengthening and endurance. BP checked in sitting after nustep: 117/85. Pt ambulated to the gym with CGA x 150 ft with cues for safety. Pt worked on standing balance and hip strengthening to performed sidestepping in each direction with orange theraband, 2 x 20 ft in each direction. Pt transferred to supine and performed 2 x 10 bridges for strengthening. Pt transferred to quadruped and performed hip extensions x 10 on each side. Pt transferred to plank on extended arms and held x 20 sec, pt performed x 3 push ups. Pt transferred to tall kneeling and performed forwards/backwards walking on knees for balance, performed sidestepping on knees for balance. Pt transferred to supine and performed leg lowers for core strengthening. Pt transferred to sitting and checked BP 102/81. Pt ambulated throughout unit to cafeteria 200+ ft with CGA, asking to go get ketchup. Pt ambulated back to unit with CGA and transferred to bed, left supine with needs in reach and bed alarm set.   Therapy Documentation Precautions:   Precautions Precautions: Fall Restrictions Weight Bearing Restrictions: No    Therapy/Group: Individual Therapy  Cresenciano Genre, PT, DPT 07/12/2018, 7:56 AM

## 2018-07-12 NOTE — Consult Note (Signed)
Neuropsychological Consultation   Patient:   Kenneth Morales   DOB:   1966/02/12  MR Number:  [id number]  Location:  [facility] [address] CENTER A [address] [id number] [place] [address] Dept: [phone] Loc: [phone]           Date of Service:   07/12/2018  Start Time:   11 AM End Time:   12 PM  Provider/Observer:  Arley Phenix, Psy.D.       Clinical Neuropsychologist       Billing Code/Service: [phone]  Chief Complaint:    Kenneth Morales is a 53 year old male with history of CAD status post PCI maintained on Brilinta/aspirin, tobacco/polysubstance abuse, hypertension.  Presented on 07/04/2018 with dyspnea, nausea, and intermittent SOB as well as numerous falls.  CT head showed right cerebellar infarction likely subacute no hemorrhage.  UDS positive for cocaine.  Patient reports today that he used cocaine after his initial presentation to ED     On 07/02/2018 for the viral illness.    Reason for Service:  Kenneth Morales is a 53 year old right-handed male with history of CAD status post PCI maintained on Brilinta/aspirin present but noncompliant, tobacco/polysubstance abuse, hypertension maintained on Norvasc 5 mg twice daily, Lopressor 25 mg twice a day. Per chart review patient lives with 52 year old daughter. Two-level home. Independent prior to admission.Presented 07/04/2018 with dyspnea, nausea and intermittent shortness of breath as well as numerous falls. He was recently seen in the ED for a viral illness placed on antibiotic therapy and discharged home. CT of the head showed right cerebellar infarction likely subacute no hemorrhage. Patient did not receive TPA.Urine drug screen positive for cocaine. CT angiogram of head and neck with no large vessel occlusion. 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 No mass  effect. Echocardiogram with ejection fraction of 65% hyperdynamic systolic function. Neurology follow-up currently on aspirin and Plavix for CVA prophylaxis. Patient's Brilinta has been discontinued. Subcutaneous Lovenox for DVT prophylaxis. Tolerating a regular diet. Therapy evaluations completed with recommendations of physical medicine rehabilitation consult patient was admitted for a comprehensive rehabilitation program.  Current Status:  Patient reports that he is still having issues with gait and coping with long hospital stay but reports his biggest concern is his 99 year old daughter and wanting to be home to help take care of her.  She is being taking care of by friends/family now.  He is especially concerned now that schools are closed.  Behavioral Observation: Kenneth Morales  presents as a 53 y.o.-year-old Right African American Male who appeared his stated age. his dress was Appropriate and he was Well Groomed and his manners were Appropriate to the situation.  his participation was indicative of Appropriate and Redirectable behaviors.  There were any physical disabilities noted.  he displayed an appropriate level of cooperation and motivation.     Interactions:    Active Appropriate and Redirectable  Attention:   abnormal and attention span appeared shorter than expected for age  Memory:   within normal limits; recent and remote memory intact  Visuo-spatial:  not examined  Speech (Volume):  normal  Speech:   normal;   Thought Process:  Coherent and Circumstantial  Though Content:  Rumination; not suicidal and not homicidal  Orientation:   person, place, time/date and situation  Judgment:   Fair  Planning:  Fair  Affect:    Anxious  Mood:    Anxious and Depressed  Insight:   Fair  Intelligence:   normal  Substance Use:  There is a documented history of cocaine abuse confirmed by the patient.    Medical History:   Past Medical History:  Diagnosis Date  . Acute  MI (HCC) MAY 2013  . CAD (coronary artery disease)    NSTEMI in 05/13 in setting of Cocaine use. Cath: 99% mid LCX stenosis with a large thrombus. PCI and BMS (4.0 X15 mm Vision) placement to mid LCX, LAD: 20%, RCA: 30%, EF: 60%.   . Cocaine abuse (HCC)    quit in 08/2011  . Depression   . Hyperlipidemia   . Hypertension   . Spider bite   . Tobacco abuse      Psychiatric History:  Patient has history of depression but denies severe depressive symptoms currently.  Family Med/Psych History:  Family History  Problem Relation Age of Onset  . Heart attack Maternal Grandfather   . Heart attack Paternal Grandfather     Risk of Suicide/Violence: low Patient denies SI or HI.  Impression/DX:  Kenneth Morales is a 53 year old male with history of CAD status post PCI maintained on Brilinta/aspirin, tobacco/polysubstance abuse, hypertension.  Presented on 07/04/2018 with dyspnea, nausea, and intermittent SOB as well as numerous falls.  CT head showed right cerebellar infarction likely subacute no hemorrhage.  UDS positive for cocaine.  Patient reports today that he used cocaine after his initial presentation to ED     On 07/02/2018 for the viral illness.    Patient reports that he is still having issues with gait and coping with long hospital stay but reports his biggest concern is his 23 year old daughter and wanting to be home to help take care of her.  She is being taking care of by friends/family now.  He is especially concerned now that schools are closed.  Patient has history of depression but denies severe depressive symptoms currently.    Disposition/Plan:  Worked on coping issues today.  Diagnosis:    Cerebellar infarction [facility]) - Plan: Ambulatory referral to Neurology, Ambulatory referral to Physical Medicine Rehab         Electronically Signed   _______________________ Arley Phenix, Psy.D.

## 2018-07-12 NOTE — Progress Notes (Signed)
Ordered smaller thigh high ted hose and abd binder.

## 2018-07-13 ENCOUNTER — Inpatient Hospital Stay (HOSPITAL_COMMUNITY): Payer: Medicaid Other | Admitting: Occupational Therapy

## 2018-07-13 ENCOUNTER — Inpatient Hospital Stay (HOSPITAL_COMMUNITY): Payer: Medicaid Other

## 2018-07-13 ENCOUNTER — Inpatient Hospital Stay (HOSPITAL_COMMUNITY): Payer: Self-pay | Admitting: Physical Therapy

## 2018-07-13 NOTE — Progress Notes (Signed)
Occupational Therapy Session Note  Patient Details  Name: KELVYN TOELLE MRN: [id number] Date of Birth: [birth year]  Today's Date: 07/13/2018 OT Individual Time: 0998-3382 OT Individual Time Calculation (min): 59 min    Short Term Goals: Week 1:  OT Short Term Goal 1 (Week 1): Pt will complete ambulatory transfer to toilet with mod vcs for RW safety OT Short Term Goal 2 (Week 1): Pt will stand for 10 minutes during a functional activity to improve standing endurance   Skilled Therapeutic Interventions/Progress Updates:    Pt completed functional mobility down to the therapy gym to start session with min guard assist.  Still noted with pt demonstrating decreased weightshift to the LLE during ambulation.  Had pt complete use of the Biodex for dynamic standing balance and reactions.  He was able to score 30-35% on Limits of stability program with stable base.  He was unable to maintain balance as efficiently when stability platform was placed on level 12 and needed min assist.  Next, had pt ambulate while bouncing the basketball with overall min guard assist.  He was also able to toss the ball to himself while walking as well at the same level.  He worked on standing tandem with overall min assist.  He was able to complete standing for longer period of time on the right foot compared to the left  (approximately 3 seconds compared to 6-7 on the right before needing to place a foot down).  Finished session with return to the room with min guard assist with pt transferring to the wheelchair with call button and phone in reach and safety alarm belt in place.    Therapy Documentation Precautions:  Precautions Precautions: Fall Restrictions Weight Bearing Restrictions: No   Vital Signs: Therapy Vitals Pulse Rate: 88 BP: 138/89 Patient Position (if appropriate): Standing Pain: Pain Assessment Pain Scale: 0-10 Pain Score: 0-No pain ADL: See Care Tool for some details of ADLs  Therapy/Group:  Individual Therapy  Antonin Meininger OTR/L 07/13/2018, 12:55 PM

## 2018-07-13 NOTE — Progress Notes (Signed)
Pt expressed concerns about going home with his blood pressure being high or low. "My blood pressure is all over, I want to have someone come check it" pt told to speak to social worker to see if he qualifies for any service or equipment. Pt told to speak to MD regarding medical management for hypertension. Current regimen reviewed. S/Sx of low B/p discussed. Pt expressed understanding. Pt was educated to need to call for assistance OOB. Pt stated " When I got to go I got to go, I can't wait". Writer left room and bed alarm sounded and pt was OOB. (Pt was offered BR assistance prior to writer leaving.) Pt was again reminded to call for assistance. In bed and bed alarm on 2 SR up call light in reach.

## 2018-07-13 NOTE — Progress Notes (Addendum)
Physical Therapy Session Note  Patient Details  Name: Kenneth Morales MRN: [id number] Date of Birth: 05-26-1965  Today's Date: 07/13/2018 PT Individual Time: 9794-8016 PT Individual Time Calculation (min): 78 min   Short Term Goals: Week 1:  PT Short Term Goal 1 (Week 1): Pt will ambulate 20' w/ CGA w/ LRAD PT Short Term Goal 2 (Week 1): Pt will negotiate 17 steps w/ CGA w/ L rail only as per home set-up PT Short Term Goal 3 (Week 1): Pt will maintain dynamic standing balance w/ min assist w/o UE support  Skilled Therapeutic Interventions/Progress Updates:     Patient sitting EOB upon PT arrival. Patient alert and agreeable to PT session. Patient was very verbose throughout session and had difficulty attending tasks performed, frequently "acting" as if he were falling without any LOB, demonstrating poor safety awareness.  Therapeutic Activity: Transfers: Patient performed sit to/from stand x9 with mod I to supervision for safety. Provided verbal cues for making sure breaks were locked on w/c before sitting down.  Gait Training:  Patient ambulated 200 feet using no AD  with CGA for safety . Ambulated with decreased gait speed, decreased step length, decreased arm swing, decreased trunk rotation, intermittent LOB when turning, and increased knee flexion on R intermittently. Provided verbal cues for slowing down to turn and walk at a faster speed with longer steps to reduce fall risk. Patient stated that he would like a cane at home. PT demonstrated how to ambulate with a cane and patient ambulated 20 feet with a cane. Patient did not attend to the task well, requiring heavy cuing for sequencing and ambulating at significantly decreased speed with step-to gait pattern despite cues to use step-through gait pattern and increase speed.  Patient also performed FGA for dynamic gait assessment, see below.   Neuromuscular Re-ed: Patient performed standing with NBS x1 with no UE support for 1 min,  tandem stance with R foot forward x3 with single UE support to get into position and held for 1, 3, and 6 seconds and L foot forward x3 for 3, 5, and 8 seconds. Also performed tandem walking with min A 10 feet x2.  Patient was on the toilet at end of session, RN aware and stated patient is cleared to be alone and call for assistance when he is done.  Therapy Documentation Precautions:  Precautions Precautions: Fall Restrictions Weight Bearing Restrictions: No Vital Signs: Therapy Vitals Temp: 98.7 F (37.1 C) Temp Source: Oral Pulse Rate: 85 Resp: 20 BP: (!) 122/92 Patient Position (if appropriate): Sitting Oxygen Therapy SpO2: 100 % O2 Device: Room Air   Pain:  Patient reported 2/10 headache throughout session, stated that it has been constant all day.     Balance: Standardized Balance Assessment Standardized Balance Assessment: (P) Functional Gait Assessment Functional Gait  Assessment Gait assessed : (P) Yes Gait Level Surface: (P) Walks 20 ft in less than 7 sec but greater than 5.5 sec, uses assistive device, slower speed, mild gait deviations, or deviates 6-10 in outside of the 12 in walkway width. Change in Gait Speed: (P) Able to change speed, demonstrates mild gait deviations, deviates 6-10 in outside of the 12 in walkway width, or no gait deviations, unable to achieve a major change in velocity, or uses a change in velocity, or uses an assistive device. Gait with Horizontal Head Turns: (P) Performs head turns with moderate changes in gait velocity, slows down, deviates 10-15 in outside 12 in walkway width but recovers, can continue to  walk. Gait with Vertical Head Turns: (P) Performs task with moderate change in gait velocity, slows down, deviates 10-15 in outside 12 in walkway width but recovers, can continue to walk. Gait and Pivot Turn: (P) Pivot turns safely in greater than 3 sec and stops with no loss of balance, or pivot turns safely within 3 sec and stops with mild  imbalance, requires small steps to catch balance. Step Over Obstacle: (P) Is able to step over one shoe box (4.5 in total height) without changing gait speed. No evidence of imbalance. Gait with Narrow Base of Support: (P) Ambulates less than 4 steps heel to toe or cannot perform without assistance. Gait with Eyes Closed: (P) Walks 20 ft, uses assistive device, slower speed, mild gait deviations, deviates 6-10 in outside 12 in walkway width. Ambulates 20 ft in less than 9 sec but greater than 7 sec. Ambulating Backwards: (P) Walks 20 ft, uses assistive device, slower speed, mild gait deviations, deviates 6-10 in outside 12 in walkway width. Steps: (P) Alternating feet, must use rail. Total Score: (P) 16   Therapy/Group: Individual Therapy  Helayne Seminole, PT, DPT 07/13/2018, 3:42 PM

## 2018-07-13 NOTE — Progress Notes (Signed)
Physical Therapy Session Note  Patient Details  Name: Kenneth Morales MRN: [id number] Date of Birth: March 11, 1966  Today's Date: 07/13/2018 PT Individual Time: 0901-1001 PT Individual Time Calculation (min): 60 min   Short Term Goals: Week 1:  PT Short Term Goal 1 (Week 1): Pt will ambulate 80' w/ CGA w/ LRAD PT Short Term Goal 2 (Week 1): Pt will negotiate 17 steps w/ CGA w/ L rail only as per home set-up PT Short Term Goal 3 (Week 1): Pt will maintain dynamic standing balance w/ min assist w/o UE support  Skilled Therapeutic Interventions/Progress Updates:    Pt lying supine in bed with nursing staff present and pt agreeable to therapy session. Vitals assessed in supine BP 155/95 HR 74bpm thigh high TED hose and abdominal binder donned with supervision, sitting on EOB BP 139/96 HR 79bpm with pt performing UB and LB dressing at EOB with supervision, standing BP 125/93 (MAP 104), HR 88bpm with pt denying symptoms. Pt performed sit<>stand transfers throughout session no AD with supervision/CGA. Ambulated ~224ft to therapy, no AD, with CGA and occasional min A when pt quickly turning and looking at obstacles/people in hallway. Reassessed vitals in sitting BP 122/97 (MAP 105), HR 82bpm. Performed dynamic standing balance tasks of weighted ball toss to trampoline standing on firm surface shoulder width stance progressed to NBOS then to airex and NBOS all with CGA throughout and no LOB. Performed lateral sidestepping with green theraband resistance with CGA focusing on hip strengthening and higher level dynamic standing balance. Performed 4 square stepping over poles with pt stepping at decreased speed with CGA throughout and no LOB. Pt performed standing basketball dribbling drills with CGA and no LOB. Ambulated ~216ft to room, no AD, with CGA for steadying and cuing for safety. Pt left sitting in wheelchair needs in reach and chair alarm on.  Therapy Documentation Precautions:  Precautions Precautions:  Fall Restrictions Weight Bearing Restrictions: No  Vital Signs: Reported above  Pain: No reports of pain throughout session.   Therapy/Group: Individual Therapy  Ginny Forth, PT, DPT 07/13/2018, 7:53 AM

## 2018-07-13 NOTE — Patient Care Conference (Signed)
Inpatient RehabilitationTeam Conference and Plan of Care Update Date: 07/12/2018   Time: 11:30 AM    Patient Name: Kenneth Morales      Medical Record Number: [id number]  Date of Birth: [birth year] Sex: Male         Room/Bed: 4W22C/4W22C-01 Payor Info: Payor: MEDICAID [place] / Plan: MEDICAID [place] ACCESS / Product Type: *No Product type* /    Admitting Diagnosis: CVA  Admit Date/Time:  07/07/2018  1:56 PM Admission Comments: No comment available   Primary Diagnosis:  <principal problem not specified> Principal Problem: <principal problem not specified>  Patient Active Problem List   Diagnosis Date Noted  . Ataxia, post-stroke   . Gait disturbance, post-stroke   . Intractable hiccoughs   . 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 Stroke (cerebrum) (HCC) 07/04/2018  . Unstable angina (HCC) 01/26/2018  . NSTEMI (non-ST elevated myocardial infarction) (HCC) 11/28/2017  . 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 Chest pain 07/01/2016  . SOB (shortness of breath) 12/10/2011  . CAD (coronary artery disease)   . Hyperlipidemia   . Hypertension   . Tobacco abuse   . Cocaine abuse [facility])     Expected Discharge Date: Expected Discharge Date: 07/15/18  Team Members Present: Physician leading conference: Dr. Claudette Laws Social Worker Present: Dossie Der, LCSW Nurse Present: Willey Blade, RN PT Present: Woodfin Ganja, PT OT Present: Perrin Maltese, OT SLP Present: Feliberto Gottron, SLP PPS Coordinator present : Fae Pippin     Current Status/Progress Goal Weekly Team Focus  Medical   Patient still with intractable hiccups, blood pressures have been dropping in the morning during standing.  Maintain medical stability reduce orthostatic hypotension episodes  Initiate abdominal binder, thigh-high TED hose, adjust blood pressure medications   Bowel/Bladder   Continent of bladder/ bowel  maintain continence  Assess toileting , provide daily meds and prn to prevent  costipation   Swallow/Nutrition/ Hydration             ADL's   Supervison for bathing and dressing with min assist to min guard for transfers and functional mobility  modified independent overall  selfcare retraining, transfer training, balance retraining, neuromuscular re-education, pt/family education   Mobility   supervision bed mobility, supervision/CGA transfers, CGA ambulation without AD  modified independent/independent  dynamic standing balance, higher level gait challenges, stair navigation    Communication             Safety/Cognition/ Behavioral Observations            Pain   No pain   < 2  Assess QS and prn    Skin   None   Maintain to prevent skin issues and infection   QS assessment and monitoring of skin intergumentary      *See Care Plan and progress notes for long and short-term goals.     Barriers to Discharge  Current Status/Progress Possible Resolutions Date Resolved   Physician    Other (comments);Medical stability  Orthostatic hypotension  Progressing with ambulation some self-limiting behaviors  Continue rehab program see above      Nursing                  PT                    OT                  SLP  SW                Discharge Planning/Teaching Needs:  Plans on going to his home with parents assisting with transport. Pt if needed will need to go to parent's home      Team Discussion:  Goals mod/i level and higher level balance issues. BP issues and MD is aware and adjusting dosages. On baclofen for his hiccups. Attention poor but this is premorbid. Plans to go home with daughter 78.  Revisions to Treatment Plan:  DC 3/21    Continued Need for Acute Rehabilitation Level of Care: The patient requires daily medical management by a physician with specialized training in physical medicine and rehabilitation for the following conditions: Daily direction of a multidisciplinary physical rehabilitation program to ensure safe  treatment while eliciting the highest outcome that is of practical value to the patient.: Yes Daily medical management of patient stability for increased activity during participation in an intensive rehabilitation regime.: Yes Daily analysis of laboratory values and/or radiology reports with any subsequent need for medication adjustment of medical intervention for : Neurological problems   I attest that I was present, lead the team conference, and concur with the assessment and plan of the team.   Lucy Chris 07/13/2018, 8:38 AM

## 2018-07-13 NOTE — Progress Notes (Signed)
[facility] PHYSICAL MEDICINE & REHABILITATION PROGRESS NOTE   Subjective/Complaints: Patient states that hiccups ceased last night before bedtime.  He is very happy he can talk normally again ROS: Patient denies chest pain, shortness of breath, nausea vomiting diarrhea or constipation.   Objective:   No results found. No results for input(s): WBC, HGB, HCT, PLT in the last 72 hours. No results for input(s): NA, K, CL, CO2, GLUCOSE, BUN, CREATININE, CALCIUM in the last 72 hours.  Intake/Output Summary (Last 24 hours) at 07/13/2018 1531 Last data filed at 07/13/2018 1500 Gross per 24 hour  Intake 360 ml  Output -  Net 360 ml     Physical Exam: Vital Signs Blood pressure (!) 122/92, pulse 85, temperature 98.7 F (37.1 C), temperature source Oral, resp. rate 20, height 5\' 10"  (1.778 m), weight 68.3 kg, SpO2 100 %. Constitutional: No distress . Vital signs reviewed. HEENT: EOMI, oral membranes moist,  Neck: supple Cardiovascular: RRR without murmur. No JVD    Respiratory: CTA Bilaterally without wheezes or rales. Normal effort    GI: BS +, non-tender, non-distended  Musculoskeletal:Normal range of motion.  General: No deformityor edema.  Neurological: He isalertand oriented to person, place, and time. Diplopia. No nystagmus. Gaze not grossly dysconjugate. Notes decreased sensation to palm and fingers of left hand,arm,trunk and leg. Motor 4+/5 in all 4 limbs proximal to distal.  No evidence of dysmetria Sitting balance is good Skin: Skin iswarm.  Psychiatric: pleasant and cooperative     Assessment/Plan: 1. 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  Physiatrist is providing close team supervision and 24 hour management of active medical problems listed below.  Physiatrist and rehab team continue to assess barriers to discharge/monitor patient  progress toward functional and medical goals  Care Tool:  Bathing    Body parts bathed by patient: Right arm, Left arm, Chest, Abdomen, Front perineal area, Buttocks, Right upper leg, Left upper leg, Right lower leg, Left lower leg         Bathing assist Assist Level: Set up assist     Upper Body Dressing/Undressing Upper body dressing Upper body dressing/undressing activity did not occur (including orthotics): Refused What is the patient wearing?: Pull over shirt    Upper body assist Assist Level: Supervision/Verbal cueing    Lower Body Dressing/Undressing Lower body dressing    Lower body dressing activity did not occur: Refused What is the patient wearing?: Underwear/pull up, Pants     Lower body assist Assist for lower body dressing: Supervision/Verbal cueing     Toileting Toileting Toileting Activity did not occur (Clothing management and hygiene only): N/A (no void or bm)  Toileting assist Assist for toileting: Supervision/Verbal cueing     Transfers Chair/bed transfer  Transfers assist  Chair/bed transfer activity did not occur: Safety/medical concerns  Chair/bed transfer assist level: Supervision/Verbal cueing     Locomotion Ambulation   Ambulation assist      Assist level: Contact Guard/Touching assist Assistive device: Other (comment)(none ) Max distance: 200'   Walk 10 feet activity   Assist     Assist level: Contact Guard/Touching assist Assistive device: Other (comment)(none)   Walk 50 feet activity   Assist Walk 50 feet with 2 turns activity did not occur: Safety/medical concerns  Assist level: Contact Guard/Touching assist Assistive device: Other (comment)(none)    Walk 150 feet activity   Assist Walk 150 feet activity did not occur:  Safety/medical concerns  Assist level: Contact Guard/Touching assist Assistive device: Other (comment)(none)    Walk 10 feet on uneven surface  activity   Assist Walk 10 feet on uneven  surfaces activity did not occur: Safety/medical concerns         Wheelchair     Assist Will patient use wheelchair at discharge?: No   Wheelchair activity did not occur: N/A         Wheelchair 50 feet with 2 turns activity    Assist    Wheelchair 50 feet with 2 turns activity did not occur: N/A       Wheelchair 150 feet activity     Assist Wheelchair 150 feet activity did not occur: N/A        Medical Problem List and Plan: 1.Decreased functional mobility with dizzinesssecondary to multifocal acute infarct right PICA territory and 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 Pt with ongoing diplopia, hiccups, balance and coordination deficits. --Continue CIR therapies including PT, OT . Patient participating well, 2. Antithrombotics: -DVT/anticoagulation:Subcutaneous Lovenox -antiplatelet therapy: aspirin 81 mg daily, Plavix 75 mg daily 3. 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 Mood:Provide emotional support -antipsychotic agents: None 5. Neuropsych: This patientiscapable of making decisions on hisown behalf. 6. Skin/Wound Care:Routine skin checks 7. Fluids/Electrolytes/Nutrition:Routine ins and outs, metabolic package today was essentially normal 8.  History of essential hypertension but was having problems with hypotension, mainly orthostatic Lopressor 25 mg twice a day-discontinued, Norvasc 5 mg daily.-Reduce to 2.5   Vitals:   07/13/18 1110 07/13/18 1443  BP: 138/89 (!) 122/92  Pulse:  85  Resp:  20  Temp:  98.7 F (37.1 C)  SpO2:  100%  Controlled, 3/20  9. History of CAD with myocardial infarction/stenting. Continue aspirin and Plavix. Patient was noncompliant with Brilinta. Continue Ranexa 500 mg twice a day 10. Hyperlipidemia. Lipitor 11. History of tobacco polysubstance abuse. Urine drug screen positive for cocaine. Provide counseling 12. Questionable  medical noncompliance. Provide counseling 13. Constipation. Laxative assistance, had bm 3/13  14.  Elevated blood sugar we will check hemoglobin A1c 15.  Intractible hiccups resolved-baclofen to 20mg  Qid SCHEDULED for hiccups LOS: 6 days A FACE TO FACE EVALUATION WAS PERFORMED  Erick Colace 07/13/2018, 3:31 PM

## 2018-07-14 ENCOUNTER — Inpatient Hospital Stay (HOSPITAL_COMMUNITY): Payer: Medicaid Other | Admitting: Physical Therapy

## 2018-07-14 ENCOUNTER — Inpatient Hospital Stay (HOSPITAL_COMMUNITY): Payer: Medicaid Other | Admitting: Occupational Therapy

## 2018-07-14 LAB — CREATININE, SERUM
Creatinine, Ser: 0.94 mg/dL (ref 0.61–1.24)
GFR calc Af Amer: >60 mL/min (ref >60)
GFR calc non Af Amer: >60 mL/min (ref >60)

## 2018-07-14 MED ORDER — ATORVASTATIN CALCIUM 80 MG PO TABS: 80.0000 mg | ORAL_TABLET | Freq: Every day | ORAL | 5 refills | Status: DC

## 2018-07-14 MED ORDER — AMLODIPINE BESYLATE 5 MG PO TABS: 5.0000 mg | ORAL_TABLET | Freq: Every day | ORAL | 5 refills | Status: DC

## 2018-07-14 MED ORDER — AMLODIPINE BESYLATE 5 MG PO TABS
5.0000 mg | ORAL_TABLET | Freq: Every day | ORAL | Status: DC
  Administered 2018-07-15: 5 mg via ORAL
  Filled 2018-07-14: qty 1

## 2018-07-14 MED ORDER — CLOPIDOGREL BISULFATE 75 MG PO TABS: 75.0000 mg | ORAL_TABLET | Freq: Every day | ORAL | 0 refills | Status: DC

## 2018-07-14 MED ORDER — RANOLAZINE ER 500 MG PO TB12: 500.0000 mg | ORAL_TABLET | Freq: Two times a day (BID) | ORAL | 0 refills | Status: DC

## 2018-07-14 MED ORDER — ACETAMINOPHEN 325 MG PO TABS: 650.0000 mg | ORAL_TABLET | ORAL | Status: AC | PRN

## 2018-07-14 MED ORDER — OMEPRAZOLE 20 MG PO CPDR: 20.0000 mg | DELAYED_RELEASE_CAPSULE | Freq: Every day | ORAL | 0 refills | Status: DC

## 2018-07-14 NOTE — Progress Notes (Signed)
Physical Therapy Session Note  Patient Details  Name: Kenneth Morales MRN: [id number] Date of Birth: [birth year]  Today's Date: 07/14/2018 PT Individual Time: 0800-0900 PT Individual Time Calculation (min): 60 min   Short Term Goals: Week 1:  PT Short Term Goal 1 (Week 1): Pt will ambulate 73' w/ CGA w/ LRAD PT Short Term Goal 2 (Week 1): Pt will negotiate 17 steps w/ CGA w/ L rail only as per home set-up PT Short Term Goal 3 (Week 1): Pt will maintain dynamic standing balance w/ min assist w/o UE support  Skilled Therapeutic Interventions/Progress Updates:    Pt received seated in bed, agreeable to PT. No complaints of pain. Pt hyperverbal and needs redirection throughout session to focus on therapy tasks. Bed mobility mod I. Sit to stand mod I. Pt initially requests CGA for gait due to fear of falling and unsteadiness on his feet initially, pt is mod I for gait with increased time needed for safety by end of session. Ambulation up/down ramp and across uneven surface mod I with increased time needed for safety. Ascend/descend 24 stairs with one handrail and mod I. Car transfer independent. FGA 16/30. Pt wearing TED hose and abdominal binder throughout session, BP remains WFL and no signs/symptoms of BP change throughout session. Pt left seated in bed with needs in reach, bed alarm in place at end of session.  Therapy Documentation Precautions:  Precautions Precautions: Fall Restrictions Weight Bearing Restrictions: No    Therapy/Group: Individual Therapy   Peter Congo, PT, DPT  07/14/2018, 9:18 AM

## 2018-07-14 NOTE — Progress Notes (Signed)
Occupational Therapy Discharge Summary  Patient Details  Name: Kenneth Morales MRN: [id number] Date of Birth: Apr 04, 1966  Today's Date: 07/14/2018 OT Individual Time: 1415-1530 OT Individual Time Calculation (min): 75 min   Session Note:  Pt completed bathing and dressing at modified independent level to start the session.  He was able to gather all clothing and supplies and complete shower sit to stand on a shower seat.  Dressing was performed sit to stand as well from the EOB.  He then ambulated down to the ADL kitchen where he completed simple meal prep of cooking an egg as well as cleaning up the dishes with independence.  Functional mobility throughout session was completed at independent level with no assistive device.  Finished session with ambulation down to the ground floor cafeteria and around objects in [employer] with independence.  Returned to room at end of session with pt sitting on the EOB and call button and phone in reach.    Patient has met 10 of 10 long term goals due to improved activity tolerance, improved balance, postural control, ability to compensate for deficits and improved coordination.  Patient to discharge at overall Independent level.   Reasons goals not met: NA  Recommendation: Feel pt does not warrant further OT services at this time as he is independent for all selfcare tasks and only presents with some sensory deficits in the LUE and LLE and higher level balance deficits which can be addressed through follow-up PT services.  .  Equipment: tub bench  Reasons for discharge: treatment goals met and discharge from hospital  Patient/family agrees with progress made and goals achieved: Yes  OT Discharge Precautions/Restrictions  Precautions Precautions: Fall  Pain Pain Assessment Pain Scale: Faces Pain Score: 0-No pain ADL ADL Eating: Independent Grooming: Independent Where Assessed-Grooming: Standing at sink Upper Body Bathing: Modified  independent Where Assessed-Upper Body Bathing: Shower Lower Body Bathing: Modified independent Where Assessed-Lower Body Bathing: Shower Upper Body Dressing: Independent Where Assessed-Upper Body Dressing: Edge of bed Lower Body Dressing: Independent Where Assessed-Lower Body Dressing: Edge of bed Toileting: Independent Where Assessed-Toileting: Glass blower/designer: Programmer, applications Method: Counselling psychologist: Raised toilet seat Tub/Shower Transfer: Modified independent Clinical cytogeneticist Method: Optometrist: Facilities manager: Modified independent Social research officer, government Method: Heritage manager: Gaffer Baseline Vision/History: No visual deficits Vision Assessment?: No apparent visual deficits Eye Alignment: Within Functional Limits Ocular Range of Motion: Within Functional Limits Alignment/Gaze Preference: Within Defined Limits Convergence: Within functional limits Visual Fields: No apparent deficits Perception  Perception: Within Functional Limits Praxis Praxis: Intact Cognition Overall Cognitive Status: Within Functional Limits for tasks assessed Arousal/Alertness: Awake/alert Orientation Level: Oriented X4 Memory: Appears intact Awareness: Appears intact Problem Solving: Appears intact Safety/Judgment: Appears intact Sensation Sensation Light Touch: Impaired Detail(pt able to detect light touch but he reported a slight pain throughout) Hot/Cold: Impaired Detail Hot/Cold Impaired Details: Impaired LUE;Impaired LLE Proprioception: Impaired by gross assessment Stereognosis: Appears Intact Coordination Gross Motor Movements are Fluid and Coordinated: Yes Fine Motor Movements are Fluid and Coordinated: Yes Coordination and Movement Description: BUE coordination WFLs with performance of 9 hole peg test in 23 seconds with the right hand and 20 seconds with  the left Finger Nose Finger Test: WNL Rt and Lt Motor  Motor Motor - Discharge Observations: Pt with much improved functional mobility and coordination but still with higher level balance deficits. Mobility  Bed Mobility Rolling Right: Independent Rolling Left: Independent Supine to  Sit: Independent Transfers Sit to Stand: Independent Stand to Sit: Independent  Trunk/Postural Assessment  Cervical Assessment Cervical Assessment: Within Functional Limits Thoracic Assessment Thoracic Assessment: Within Functional Limits Lumbar Assessment Lumbar Assessment: Within Functional Limits Postural Control Postural Control: Within Functional Limits  Balance Balance Balance Assessed: Yes Static Sitting Balance Static Sitting - Balance Support: Feet supported;No upper extremity supported Static Sitting - Level of Assistance: 7: Independent Dynamic Sitting Balance Dynamic Sitting - Balance Support: Feet supported;No upper extremity supported Dynamic Sitting - Level of Assistance: 7: Independent Static Standing Balance Static Standing - Balance Support: No upper extremity supported;During functional activity Static Standing - Level of Assistance: 7: Independent Dynamic Standing Balance Dynamic Standing - Balance Support: During functional activity;No upper extremity supported Dynamic Standing - Level of Assistance: 7: Independent Extremity/Trunk Assessment RUE Assessment RUE Assessment: Within Functional Limits LUE Assessment LUE Assessment: Within Functional Limits   Konstantine Gervasi OTR/L 07/14/2018, 4:40 PM

## 2018-07-14 NOTE — Progress Notes (Signed)
[facility] PHYSICAL MEDICINE & REHABILITATION PROGRESS NOTE   Subjective/Complaints: No further HA since baclofen, not using thorazine Pt does not want to drive to [facility] for medical f/u ROS: Patient denies chest pain, shortness of breath, nausea vomiting diarrhea or constipation.   Objective:   No results found. No results for input(s): WBC, HGB, HCT, PLT in the last 72 hours. Recent Labs    07/14/18 0517  CREATININE 0.94    Intake/Output Summary (Last 24 hours) at 07/14/2018 0911 Last data filed at 07/14/2018 0700 Gross per 24 hour  Intake 840 ml  Output -  Net 840 ml     Physical Exam: Vital Signs Blood pressure (!) 158/88, pulse 79, temperature 98.2 F (36.8 C), temperature source Oral, resp. rate 19, height 5\' 10"  (1.778 m), weight 68.3 kg, SpO2 98 %. Constitutional: No distress . Vital signs reviewed. HEENT: EOMI, oral membranes moist,  Neck: supple Cardiovascular: RRR without murmur. No JVD    Respiratory: CTA Bilaterally without wheezes or rales. Normal effort    GI: BS +, non-tender, non-distended  Musculoskeletal:Normal range of motion.  General: No deformityor edema.  Neurological: He isalertand oriented to person, place, and time. Diplopia. No nystagmus. Gaze not grossly dysconjugate. Notes decreased sensation to palm and fingers of left hand,arm,trunk and leg. Motor 4+/5 in all 4 limbs proximal to distal.  No evidence of dysmetria Sitting balance is good Skin: Skin iswarm.  Psychiatric: pleasant and cooperative     Assessment/Plan: 1. 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  Physiatrist is providing close team supervision and 24 hour management of active medical problems listed below.  Physiatrist and rehab team continue to assess barriers to discharge/monitor patient progress toward functional and medical goals  D/C  in am, no PMR f/u will need PCP and Neuro Care Tool:  Bathing    Body parts bathed by patient: Right arm, Left arm, Chest, Abdomen, Front perineal area, Buttocks, Right upper leg, Left upper leg, Right lower leg, Left lower leg         Bathing assist Assist Level: Set up assist     Upper Body Dressing/Undressing Upper body dressing Upper body dressing/undressing activity did not occur (including orthotics): Refused What is the patient wearing?: Pull over shirt    Upper body assist Assist Level: Supervision/Verbal cueing    Lower Body Dressing/Undressing Lower body dressing    Lower body dressing activity did not occur: Refused What is the patient wearing?: Underwear/pull up, Pants     Lower body assist Assist for lower body dressing: Supervision/Verbal cueing     Toileting Toileting Toileting Activity did not occur (Clothing management and hygiene only): N/A (no void or bm)  Toileting assist Assist for toileting: Supervision/Verbal cueing     Transfers Chair/bed transfer  Transfers assist  Chair/bed transfer activity did not occur: Safety/medical concerns  Chair/bed transfer assist level: Independent with assistive device     Locomotion Ambulation   Ambulation assist      Assist level: Independent with assistive device Assistive device: Other (comment)(increased time) Max distance: 200'   Walk 10 feet activity   Assist     Assist level: Independent with assistive device Assistive device: Other (comment)(none)   Walk 50 feet activity   Assist Walk 50 feet with 2 turns activity did not occur: Safety/medical concerns  Assist level: Independent with assistive device Assistive device: Other (comment)(none)    Walk 150 feet  activity   Assist Walk 150 feet activity did not occur: Safety/medical concerns  Assist level: Independent with assistive device Assistive device: Other (comment)(none)    Walk 10 feet on uneven surface   activity   Assist Walk 10 feet on uneven surfaces activity did not occur: Safety/medical concerns   Assist level: Independent with assistive device     Wheelchair     Assist Will patient use wheelchair at discharge?: No   Wheelchair activity did not occur: N/A         Wheelchair 50 feet with 2 turns activity    Assist    Wheelchair 50 feet with 2 turns activity did not occur: N/A       Wheelchair 150 feet activity     Assist Wheelchair 150 feet activity did not occur: N/A        Medical Problem List and Plan: 1.Decreased functional mobility with dizzinesssecondary to multifocal acute infarct right PICA territory and 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 Pt with ongoing diplopia, hiccups, balance and coordination deficits. --Continue CIR therapies including PT, OT . d/c in am discussed cessation of cocaine as medical rec- essentially mod I , 2. Antithrombotics: -DVT/anticoagulation:Subcutaneous Lovenox -antiplatelet therapy: aspirin 81 mg daily, Plavix 75 mg daily 3. 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 Mood:Provide emotional support -antipsychotic agents: None 5. Neuropsych: This patientiscapable of making decisions on hisown behalf. 6. Skin/Wound Care:Routine skin checks 7. Fluids/Electrolytes/Nutrition:Routine ins and outs, metabolic package today was essentially normal 8.  History of essential hypertension but was having problems with hypotension, mainly orthostatic Lopressor 25 mg twice a day-discontinued, Norvasc 5 mg daily.-Reduce to 2.5   Vitals:   07/14/18 0314 07/14/18 0554  BP: (!) 142/96 (!) 158/88  Pulse: 73 79  Resp: 19   Temp: 99.1 F (37.3 C) 98.2 F (36.8 C)  SpO2: 98% 98%  trending up, 3/20 increase amlodipine to 5mg  9. History of CAD with myocardial infarction/stenting. Continue aspirin and Plavix. Patient was noncompliant with  Brilinta. Continue Ranexa 500 mg twice a day 10. Hyperlipidemia. Lipitor 11. History of tobacco polysubstance abuse. Urine drug screen positive for cocaine. Provide counseling 12. Questionable medical noncompliance. Provide counseling 13. Constipation. Laxative assistance, had bm 3/13  14.  Elevated blood sugar we will check hemoglobin A1c 15.  Intractible hiccups resolved-baclofen to 20mg  Qid SCHEDULED for hiccups, d/c thorazineLOS: 7 days A FACE TO FACE EVALUATION WAS PERFORMED  Erick Colace 07/14/2018, 9:11 AM

## 2018-07-14 NOTE — Discharge Instructions (Signed)
Inpatient Rehab Discharge Instructions  Kenneth Morales Discharge date and time: No discharge date for patient encounter.   Activities/Precautions/ Functional Status: Activity: activity as tolerated Diet: regular diet Wound Care: none needed Functional status:  ___ No restrictions     ___ Walk up steps independently ___ 24/7 supervision/assistance   ___ Walk up steps with assistance ___ Intermittent supervision/assistance  ___ Bathe/dress independently ___ Walk with walker     _x__ Bathe/dress with assistance ___ Walk Independently    ___ Shower independently ___ Walk with assistance    ___ Shower with assistance ___ No alcohol     ___ Return to work/school ________  Special Instructions: No driving No smoking, alcohol, tobacco use or illicit drugs  COMMUNITY REFERRALS UPON DISCHARGE:   Outpatient: PT  Agency:[facility] Phone:[phone]   Date of Last Service:07/15/2018  Appointment Date/Time:WILL CALL YOU REGARDING APPOINTMENT  Medical Equipment/Items Ordered:CANE & TUB BENCH  Agency/Supplier:[employer]   [phone]   GENERAL COMMUNITY RESOURCES FOR PATIENT/FAMILY: Support Groups:[facility] SUPPORT GROUP THE SECOND Thursday OF EACH MONTH @ 6:00-7:00 PM  ON THE REHAB UNIT QUESTIONS CALL AMY [phone]  STROKE/TIA DISCHARGE INSTRUCTIONS SMOKING Cigarette smoking nearly doubles your risk of having a stroke & is the single most alterable risk factor  If you smoke or have smoked in the last 12 months, you are advised to quit smoking for your health.  Most of the excess cardiovascular risk related to smoking disappears within a year of stopping.  Ask you doctor about anti-smoking medications  [place] Quit Line: 1-800-QUIT NOW  Free Smoking Cessation Classes [phone]  CHOLESTEROL Know your levels; limit fat & cholesterol in your diet  Lipid Panel     Component Value Date/Time   CHOL 172 07/05/2018 0303   CHOL 212 (H) 08/15/2014 0729   TRIG 143 07/05/2018 0303     TRIG 179 (H) 08/15/2014 0729   HDL 34 (L) 07/05/2018 0303   HDL 28 (L) 08/15/2014 0729   CHOLHDL 5.1 07/05/2018 0303   VLDL 29 07/05/2018 0303   VLDL 36 08/15/2014 0729   LDLCALC 109 (H) 07/05/2018 0303   LDLCALC 148 (H) 08/15/2014 0729      Many patients benefit from treatment even if their cholesterol is at goal.  Goal: Total Cholesterol (CHOL) less than 160  Goal:  Triglycerides (TRIG) less than 150  Goal:  HDL greater than 40  Goal:  LDL (LDLCALC) less than 100   BLOOD PRESSURE American Stroke Association blood pressure target is less that 120/80 mm/Hg  Your discharge blood pressure is:  BP: 118/88  Monitor your blood pressure  Limit your salt and alcohol intake  Many individuals will require more than one medication for high blood pressure  DIABETES (A1c is a blood sugar average for last 3 months) Goal HGBA1c is under 7% (HBGA1c is blood sugar average for last 3 months)  Diabetes: No known diagnosis of diabetes    Lab Results  Component Value Date   HGBA1C 5.1 07/05/2018     Your HGBA1c can be lowered with medications, healthy diet, and exercise.  Check your blood sugar as directed by your physician  Call your physician if you experience unexplained or low blood sugars.  PHYSICAL ACTIVITY/REHABILITATION Goal is 30 minutes at least 4 days per week  Activity: Increase activity slowly, Therapies: Physical Therapy: Home Health Return to work:   Activity decreases your risk of heart attack and stroke and makes your heart stronger.  It helps control your weight and blood  pressure; helps you relax and can improve your mood.  Participate in a regular exercise program.  Talk with your doctor about the best form of exercise for you (dancing, walking, swimming, cycling).  DIET/WEIGHT Goal is to maintain a healthy weight  Your discharge diet is:  Diet Order            Diet Heart Room service appropriate? Yes; Fluid consistency: Thin  Diet effective now               liquids Your height is:  Height: 5\' 10"  (177.8 cm) Your current weight is: Weight: 68.3 kg Your Body Mass Index (BMI) is:  BMI (Calculated): 21.61  Following the type of diet specifically designed for you will help prevent another stroke.  Your goal weight range is:    Your goal Body Mass Index (BMI) is 19-24.  Healthy food habits can help reduce 3 risk factors for stroke:  High cholesterol, hypertension, and excess weight.  RESOURCES Stroke/Support Group:  Call [phone]   STROKE EDUCATION PROVIDED/REVIEWED AND GIVEN TO PATIENT Stroke warning signs and symptoms How to activate emergency medical system (call 911). Medications prescribed at discharge. Need for follow-up after discharge. Personal risk factors for stroke. Pneumonia vaccine given:  Flu vaccine given:  My questions have been answered, the writing is legible, and I understand these instructions.  I will adhere to these goals & educational materials that have been provided to me after my discharge from the hospital.      My questions have been answered and I understand these instructions. I will adhere to these goals and the provided educational materials after my discharge from the hospital.  Patient/Caregiver Signature _______________________________ Date __________  Clinician Signature _______________________________________ Date __________  Please bring this form and your medication list with you to all your follow-up doctor's appointments.

## 2018-07-14 NOTE — Progress Notes (Signed)
Physical Therapy Discharge Summary  Patient Details  Name: Kenneth Morales MRN: [id number] Date of Birth: 11-17-1965  Today's Date: 07/14/2018 PT Individual Time: 1012-1102 PT Individual Time Calculation (min): 50 min  and Today's Date: 07/14/2018 PT Missed Time: 10 Minutes Missed Time Reason: Other (Comment)(patient speaking with PA)   Patient has met 9 of 9 long term goals due to improved activity tolerance, improved balance, improved postural control, increased strength, improved attention, improved awareness and improved coordination.  Patient to discharge at an ambulatory level Modified Independent.   Patient's care partner is independent to provide the necessary physical assistance at discharge.  Reasons goals not met: Patient met all goals  Recommendation:  Patient will benefit from ongoing skilled PT services in outpatient setting; however, due to clinic status pt will be receiving HHPT to continue to advance safe functional mobility, address ongoing impairments in dynamic standing balance, higher level gait, motor coordination, and minimize fall risk.  Equipment: No equipment recommended due to pt ambulating with increased safety and steadiness without AD; however, pt requesting SPC  Reasons for discharge: treatment goals met  Patient/family agrees with progress made and goals achieved: Yes  Skilled Therapeutic Interventions/Progress Updates:  Patient received sitting on EOB discussing discharge recommendations with PA resulting in 110mnutes of missed therapy. Pt ambulated >2521fthroughout session, no AD, independently with no LOB. Performed dynamic standing balance task of 4 square stepping to external target on verbal command progressed to 2 step command then progressed to turning 180degrees and stepping toward target based on recall with no visual input - pt performed all with CGA for safety and no LOB. Performed standing balance task of picking up objects from floor  independently with no LOB. Performed higher level gait challenge of backwards ambulation with close supervision for safety - no LOB but stepped with increased caution and decreased speed for safety. Progressed to higher level gait of grapevine walking with initial CGA progressed to close supervision once pt able to recall sequencing of steps with no LOB and gradual increased speed with improved LE sequencing. Participated in balance tasks of Berg Balance Test demonstrating improvement in score to 51/56 indicating low risk for falls. Pt returned to room and left sitting in w/c with needs in reach and seatbelt alarm on.    PT Discharge Precautions/Restrictions Precautions Precautions: Fall Restrictions Weight Bearing Restrictions: No Vital Signs Assessed in sitting at beginning of therapy session BP 140/101 (MAP 114), HR 79bpm, SpO2 98% Assessed after activity during therapy session BP 136/98 (MAP 110), HR 87bpm Pain Pain Assessment Pain Scale: 0-10 Pain Score: 0-No pain Pain Intervention(s): (therapy to tolerance) Vision/Perception  Perception Perception: Within Functional Limits Praxis Praxis: Intact  Cognition Overall Cognitive Status: Within Functional Limits for tasks assessed Arousal/Alertness: Awake/alert Orientation Level: Oriented X4 Memory: Appears intact Awareness: Appears intact Problem Solving: Appears intact Behaviors: Restless Safety/Judgment: Appears intact Sensation Sensation Light Touch: Impaired Detail(able to sense light touch in L UE and L LE on screen but reports "painful" abnormal sensation compared to R hemibody) Hot/Cold: Impaired Detail Hot/Cold Impaired Details: Impaired LUE;Impaired LLE Proprioception: Impaired by gross assessment Coordination Gross Motor Movements are Fluid and Coordinated: No Coordination and Movement Description: continues to demonstrate minor ataxic gross motor movements during ambulation Heel Shin Test: symmetrical in   BLEs Motor  Motor Motor: Ataxia  Mobility Bed Mobility Bed Mobility: Rolling Left;Rolling Right;Supine to Sit;Sit to Supine Rolling Right: Independent Rolling Left: Independent Supine to Sit: Independent Sit to Supine: Independent Transfers Transfers: Sit to Stand;Stand  to Sit;Stand Pivot Transfers Sit to Stand: Independent Stand to Sit: Independent Stand Pivot Transfers: Independent Stand Pivot Transfer Details: Visual cues/gestures for sequencing;Verbal cues for precautions/safety Transfer (Assistive device): None Locomotion  Gait Ambulation: Yes Gait Assistance: Independent(with increased time for safety) Gait Distance (Feet): 250 Feet Assistive device: None Gait Assistance Details: Visual cues/gestures for precautions/safety;Verbal cues for precautions/safety Gait Gait: Yes Gait Pattern: Impaired Gait Pattern: Decreased stance time - left;Ataxic;Wide base of support High Level Ambulation High Level Ambulation: Other high level ambulation;Backwards walking Backwards Walking: ambulates backwards for ~98f x2 without AD and close supervision for safety with pt going at slow speed to ensure balance but no LOB or unsteadiness noted High Level Ambulation - Other Comments: performed grapevine for ~326fx 2 without AD with CGA progressed to close supervision once pt able to recall sequencing of steps Stairs / Additional Locomotion Stairs: Yes Stairs Assistance: Independent with assistive device Stair Management Technique: Other (comment)(unilateral handrail) Number of Stairs: 24 Height of Stairs: 6 Ramp: Independent(with increased time for safety) Curb: Independent(with increased time for safety) Wheelchair Mobility Wheelchair Mobility: No  Trunk/Postural Assessment  Cervical Assessment Cervical Assessment: Within Functional Limits Thoracic Assessment Thoracic Assessment: Within Functional Limits Lumbar Assessment Lumbar Assessment: Within Functional Limits Postural  Control Postural Control: Within Functional Limits  Balance Balance Balance Assessed: Yes Standardized Balance Assessment Standardized Balance Assessment: Berg Balance Test;Functional Gait Assessment Berg Balance Test Sit to Stand: Able to stand without using hands and stabilize independently Standing Unsupported: Able to stand safely 2 minutes Sitting with Back Unsupported but Feet Supported on Floor or Stool: Able to sit safely and securely 2 minutes Stand to Sit: Sits safely with minimal use of hands Transfers: Able to transfer safely, minor use of hands Standing Unsupported with Eyes Closed: Able to stand 10 seconds safely Standing Ubsupported with Feet Together: Able to place feet together independently and stand 1 minute safely From Standing, Reach Forward with Outstretched Arm: Can reach confidently >25 cm (10") From Standing Position, Pick up Object from Floor: Able to pick up shoe safely and easily From Standing Position, Turn to Look Behind Over each Shoulder: Looks behind from both sides and weight shifts well Turn 360 Degrees: Able to turn 360 degrees safely in 4 seconds or less Standing Unsupported, Alternately Place Feet on Step/Stool: Able to stand independently and safely and complete 8 steps in 20 seconds Standing Unsupported, One Foot in Front: Able to take small step independently and hold 30 seconds Standing on One Leg: Tries to lift leg/unable to hold 3 seconds but remains standing independently Total Score: 51 Static Sitting Balance Static Sitting - Balance Support: Feet supported;No upper extremity supported Static Sitting - Level of Assistance: 7: Independent Dynamic Sitting Balance Dynamic Sitting - Balance Support: Feet supported;No upper extremity supported Dynamic Sitting - Level of Assistance: 7: Independent Static Standing Balance Static Standing - Balance Support: No upper extremity supported;During functional activity Static Standing - Level of  Assistance: 7: Independent Dynamic Standing Balance Dynamic Standing - Balance Support: During functional activity;No upper extremity supported Dynamic Standing - Level of Assistance: 7: Independent Functional Gait  Assessment Gait assessed : Yes Gait Level Surface: Walks 20 ft in less than 7 sec but greater than 5.5 sec, uses assistive device, slower speed, mild gait deviations, or deviates 6-10 in outside of the 12 in walkway width. Change in Gait Speed: Able to change speed, demonstrates mild gait deviations, deviates 6-10 in outside of the 12 in walkway width, or no gait deviations, unable  to achieve a major change in velocity, or uses a change in velocity, or uses an assistive device. Gait with Horizontal Head Turns: Performs head turns with moderate changes in gait velocity, slows down, deviates 10-15 in outside 12 in walkway width but recovers, can continue to walk. Gait with Vertical Head Turns: Performs task with moderate change in gait velocity, slows down, deviates 10-15 in outside 12 in walkway width but recovers, can continue to walk. Gait and Pivot Turn: Pivot turns safely in greater than 3 sec and stops with no loss of balance, or pivot turns safely within 3 sec and stops with mild imbalance, requires small steps to catch balance. Step Over Obstacle: Is able to step over one shoe box (4.5 in total height) without changing gait speed. No evidence of imbalance. Gait with Narrow Base of Support: Ambulates less than 4 steps heel to toe or cannot perform without assistance. Gait with Eyes Closed: Walks 20 ft, uses assistive device, slower speed, mild gait deviations, deviates 6-10 in outside 12 in walkway width. Ambulates 20 ft in less than 9 sec but greater than 7 sec. Ambulating Backwards: Walks 20 ft, uses assistive device, slower speed, mild gait deviations, deviates 6-10 in outside 12 in walkway width. Steps: Alternating feet, must use rail. Total Score: 16 Extremity Assessment  RLE  Assessment RLE Assessment: Within Functional Limits LLE Assessment LLE Assessment: Within Functional Limits    Tawana Scale, PT, DPT 07/14/2018, 11:01 AM

## 2018-07-14 NOTE — Discharge Summary (Signed)
Physician Discharge Summary  Patient ID: Kenneth Morales MRN: [id number] DOB/AGE: 1965-06-17 53 y.o.  Admit date: 07/07/2018 Discharge date: 07/15/2018  Discharge Diagnoses:  Active Problems:   Cerebellar infarction (HCC)   Ataxia, post-stroke   Gait disturbance, post-stroke   Intractable hiccoughs Hypertension CAD with myocardial infarction and stenting Hyperlipidemia History of medical noncompliance History of tobacco polysubstance abuse  Discharged Condition: stable  Significant Diagnostic Studies: Ct Angio Head W Or Wo Contrast  Result Date: 07/04/2018 CLINICAL DATA:  Stroke follow-up. Falls and generalized weakness. EXAM: CT ANGIOGRAPHY HEAD AND NECK TECHNIQUE: Multidetector CT imaging of the head and neck was performed using the standard protocol during bolus administration of intravenous contrast. Multiplanar CT image reconstructions and MIPs were obtained to evaluate the vascular anatomy. Carotid stenosis measurements (when applicable) are obtained utilizing NASCET criteria, using the distal internal carotid diameter as the denominator. CONTRAST:  72mL OMNIPAQUE IOHEXOL 350 MG/ML SOLN COMPARISON:  Head CT 07/04/2018 FINDINGS: CTA NECK FINDINGS SKELETON: There is no bony spinal canal stenosis. No lytic or blastic lesion. OTHER NECK: Normal pharynx, larynx and major salivary glands. No cervical lymphadenopathy. Unremarkable thyroid gland. UPPER CHEST: No pneumothorax or pleural effusion. No nodules or masses. AORTIC ARCH: There is no calcific atherosclerosis of the aortic arch. There is no aneurysm, dissection or hemodynamically significant stenosis of the visualized ascending aorta and aortic arch. Conventional 3 vessel aortic branching pattern. The visualized proximal subclavian arteries are widely patent. RIGHT CAROTID SYSTEM: --Common carotid artery: Widely patent origin without common carotid artery dissection or aneurysm. --Internal carotid artery: Normal without aneurysm,  dissection or stenosis. --External carotid artery: No acute abnormality. LEFT CAROTID SYSTEM: --Common carotid artery: Widely patent origin without common carotid artery dissection or aneurysm. --Internal carotid artery: Normal without aneurysm, dissection or stenosis. --External carotid artery: No acute abnormality. VERTEBRAL ARTERIES: Right dominant configuration. Both origins are normal. No dissection, occlusion or flow-limiting stenosis to the vertebrobasilar confluence. CTA HEAD FINDINGS POSTERIOR CIRCULATION: --Vertebral arteries: Normal codominant configuration of V4 segments. --Posterior inferior cerebellar arteries (PICA): Patent origins from the vertebral arteries. --Anterior inferior cerebellar arteries (AICA): Not clearly visualized on the right. Normal left. --Basilar artery: Normal. --Superior cerebellar arteries: Normal. --Posterior cerebral arteries (PCA): Normal. Both originate from the basilar artery. Posterior communicating arteries (p-comm) are diminutive or absent. ANTERIOR CIRCULATION: --Intracranial internal carotid arteries: Normal. --Anterior cerebral arteries (ACA): Normal. Both A1 segments are present. Patent anterior communicating artery (a-comm). --Middle cerebral arteries (MCA): Normal. VENOUS SINUSES: As permitted by contrast timing, patent. ANATOMIC VARIANTS: None DELAYED PHASE: No parenchymal contrast enhancement. Old right cerebellar infarct. Review of the MIP images confirms the above findings. IMPRESSION: No emergent large vessel occlusion or hemodynamically significant stenosis of the arteries of the head and neck. Electronically Signed   By: Deatra Robinson M.D.   On: 07/04/2018 18:44   Ct Head Wo Contrast  Result Date: 07/04/2018 CLINICAL DATA:  53 year old male with weakness and ataxia for 2 days. EXAM: CT HEAD WITHOUT CONTRAST TECHNIQUE: Contiguous axial images were obtained from the base of the skull through the vertex without intravenous contrast. COMPARISON:  08/15/2014  and CT FINDINGS: Brain: Small to moderate RIGHT cerebellar infarct is noted without hemorrhage, new since 08/15/2014. No other infarct identified. No mass lesion, midline shift, hydrocephalus or extra-axial collection identified. Vascular: No hyperdense vessel or unexpected calcification. Skull: Normal. Negative for fracture or focal lesion. Sinuses/Orbits: No acute finding. Other: None. 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  No hemorrhage. Electronically Signed   By: Harmon Pier  M.D.   On: 07/04/2018 13:42   Ct Angio Neck W Or Wo Contrast  Result Date: 07/04/2018 CLINICAL DATA:  Stroke follow-up. Falls and generalized weakness. EXAM: CT ANGIOGRAPHY HEAD AND NECK TECHNIQUE: Multidetector CT imaging of the head and neck was performed using the standard protocol during bolus administration of intravenous contrast. Multiplanar CT image reconstructions and MIPs were obtained to evaluate the vascular anatomy. Carotid stenosis measurements (when applicable) are obtained utilizing NASCET criteria, using the distal internal carotid diameter as the denominator. CONTRAST:  75mL OMNIPAQUE IOHEXOL 350 MG/ML SOLN COMPARISON:  Head CT 07/04/2018 FINDINGS: CTA NECK FINDINGS SKELETON: There is no bony spinal canal stenosis. No lytic or blastic lesion. OTHER NECK: Normal pharynx, larynx and major salivary glands. No cervical lymphadenopathy. Unremarkable thyroid gland. UPPER CHEST: No pneumothorax or pleural effusion. No nodules or masses. AORTIC ARCH: There is no calcific atherosclerosis of the aortic arch. There is no aneurysm, dissection or hemodynamically significant stenosis of the visualized ascending aorta and aortic arch. Conventional 3 vessel aortic branching pattern. The visualized proximal subclavian arteries are widely patent. RIGHT CAROTID SYSTEM: --Common carotid artery: Widely patent origin without common carotid artery dissection or aneurysm. --Internal carotid artery: Normal without aneurysm,  dissection or stenosis. --External carotid artery: No acute abnormality. LEFT CAROTID SYSTEM: --Common carotid artery: Widely patent origin without common carotid artery dissection or aneurysm. --Internal carotid artery: Normal without aneurysm, dissection or stenosis. --External carotid artery: No acute abnormality. VERTEBRAL ARTERIES: Right dominant configuration. Both origins are normal. No dissection, occlusion or flow-limiting stenosis to the vertebrobasilar confluence. CTA HEAD FINDINGS POSTERIOR CIRCULATION: --Vertebral arteries: Normal codominant configuration of V4 segments. --Posterior inferior cerebellar arteries (PICA): Patent origins from the vertebral arteries. --Anterior inferior cerebellar arteries (AICA): Not clearly visualized on the right. Normal left. --Basilar artery: Normal. --Superior cerebellar arteries: Normal. --Posterior cerebral arteries (PCA): Normal. Both originate from the basilar artery. Posterior communicating arteries (p-comm) are diminutive or absent. ANTERIOR CIRCULATION: --Intracranial internal carotid arteries: Normal. --Anterior cerebral arteries (ACA): Normal. Both A1 segments are present. Patent anterior communicating artery (a-comm). --Middle cerebral arteries (MCA): Normal. VENOUS SINUSES: As permitted by contrast timing, patent. ANATOMIC VARIANTS: None DELAYED PHASE: No parenchymal contrast enhancement. Old right cerebellar infarct. Review of the MIP images confirms the above findings. IMPRESSION: No emergent large vessel occlusion or hemodynamically significant stenosis of the arteries of the head and neck. Electronically Signed   By: Deatra Robinson M.D.   On: 07/04/2018 18:44   Mr Brain Wo Contrast  Result Date: 07/04/2018 CLINICAL DATA:  Stroke follow-up. Nausea, vomiting and gait instability. EXAM: MRI HEAD WITHOUT CONTRAST TECHNIQUE: Multiplanar, multiecho pulse sequences of the brain and surrounding structures were obtained without intravenous contrast.  COMPARISON:  CTA head neck 07/04/2018 FINDINGS: BRAIN: There is multifocal ischemia within the right cerebellar hemisphere, within the PICA territory. No other area of abnormal diffusion restriction. The midline structures are normal. No midline shift or other mass effect. Mild edema in the right cerebellum. Multifocal periventricular white matter hyperintensity, most often a result of chronic microvascular ischemia. The cerebral and cerebellar volume are age-appropriate. There is magnetic susceptibility effect along the course of the right PICA, likely indicating the presence of thrombus. VASCULAR: Major intracranial arterial and venous sinus flow voids are normal. SKULL AND UPPER CERVICAL SPINE: Calvarial bone marrow signal is normal. There is no skull base mass. Visualized upper cervical spine and soft tissues are normal. SINUSES/ORBITS: No fluid levels or advanced mucosal thickening. No mastoid or middle ear effusion. The orbits are normal.  IMPRESSION: 1. 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 No mass effect or acute hemorrhage. 2. Magnetic susceptibility effect along the course of the right PICA likely indicates the presence of thrombus. This corresponds to areas of non opacification on the CTA of the head and neck, though the PICA origin is patent. Electronically Signed   By: Deatra Robinson M.D.   On: 07/04/2018 21:49    Labs:  Basic Metabolic Panel: Recent Labs  Lab 07/07/18 1435 07/10/18 0631  NA  --  138  K  --  4.1  CL  --  106  CO2  --  25  GLUCOSE  --  195*  BUN  --  18  CREATININE 1.00 0.92  CALCIUM  --  9.1    CBC: Recent Labs  Lab 07/07/18 1435 07/10/18 0631  WBC 8.1 8.4  NEUTROABS  --  5.4  HGB 14.1 15.4  HCT 40.8 44.1  MCV 95.1 96.5  PLT 219 272    CBG: No results for input(s): GLUCAP in the last 168 hours.  Family history. Maternal grandfather with CAD MI. Denies any diabetes cancer or CVA  Brief HPI:     Kenneth Morales is a 53 year old right-handed male with history of CAD with PCI have been maintained on Brilinta and aspirin in the past but noncompliant, tobacco polysubstance abuse, hypertension had been on Norvasc and Lopressor. Patient lives with 18 year old daughter independent prior to admission. Presented 07/04/2018 with dyspnea, nausea and intermittent shortness of breath as well as numerous falls. He was recently seen in the ED for a viral illness placed on antibiotic therapy and discharged to home. CT of the head showed right cerebellar infarction likely subacute no hemorrhage. Patient did not receive TPA. Urine drug screen positive for cocaine. CT angiogram of head and neck with no large vessel occlusion. MRI showed multifocal acute ischemic infarcts of the right cerebellum. No mass effect. Echocardiogram with ejection fraction 65% hyperdynamic systolic function. Neurology follow-up maintained on aspirin and Plavix therapy. Subcutaneous Lovenox for DVT prophylaxis. Tolerating a regular diet. Patient was admitted for a comprehensive rehabilitation program.  Review of systems. Positive for shortness of breath occasional chest pain as well as nausea and vomiting. Patient reported numerous falls.  Neurological positive for dizziness, weakness and headache. Negative for seizures Psychiatric behavioral. Positive for depression as well as insomnia  Hospital Course: DURRELL BARAJAS was admitted to rehab 07/07/2018 for inpatient therapies to consist of PT, ST and OT at least three hours five days a week. Past admission physiatrist, therapy team and rehab RN have worked together to provide customized collaborative inpatient rehab. Pertaining to patient multifocal acute infarct right PICA territory. Patient remained on aspirin and Plavix therapy per neurology services. No bleeding episodes. Subcutaneous Lovenox for DVT prophylaxis. Pain management with Tylenol . 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 Blood pressures monitored closely with Norvasc. Patient had been on Lopressor decrease and taper to off due to early on hospital course of hypotension and Norvasc 5 mg daily.Plan follow PCP for blood pressure monitoring. Patient remained on Lipitor for hyperlipidemia. He did have a history of CAD with stenting no chest pain or increasing shortness of breath he remained on aspirin and Plavix therapy as well as Ranexa. History of tobacco polysubstance abuse urine drug screen was positive for cocaine. Counseling was provided in regard to cessation of nicotine products.intractable hiccups maintained on baclofen and slowly taper to off.  Initial chemistry showed mildly elevated glucose 132-195 hemoglobin A1c completed unremarkable 5.5. Bouts of constipation resolved with laxative assistance.   Physical exam. Blood pressure 140/99 pulse 70 temp 98 respirations 20.Oxygen saturations 98% Constitutional.Patient oriented to person place and time appeared well-developed and well-nourished. HEENT. Head. Normocephalic and atraumatic Eyes. Pupils are equal round and reactive to light EOMs are normal. No nystagmus Neck. Normal range of motion no tracheal deviation present. No thyromegaly. Without bruit. Cardiovascular. Normal rate and rhythm exam reveals no gallop and no friction rub no murmur heard. Respiratory. Effort normal. No respiratory distress. He has no wheezes. He has no rales GI. Soft. He exhibits no distention no abdominal tenderness without rebound Musculoskeletal. Normal range of motion no deformity or edema Neurological. Alert and oriented reasonable insight and awareness. Normal memory. Language normal. Speech was clear. Mild diplopia in all fields. Gaze does not appear grossly disconjugate. No nystagmus. No gross limb ataxia. Notes decreased sensation to palm and fingers of left hand. Motor muscle strength 4+ out of 5 in all 4 limbs proximal to distal.   Rehab course:  During patient's stay in rehab weekly team conferences were held to monitor patient's progress, set goals and discuss barriers to discharge. At admission, patient required moderate assist to ambulate 40 feet 2 person hand held assist, 2 person hand held assist her equipment used. Modified independent overall bed mobility. Moderate assist lower body bathing as well as dressing. Supervision for grooming. Minimal assist upper body bathing was set up as well as dressing. Minimal assist toileting and clothing as well as hygiene.  He  has had improvement in activity tolerance, balance, postural control as well as ability to compensate for deficits. He/She has had improvement in functional use RUE/LUE  and RLE/LLE as well as improvement in awareness.ambulating 200 feet no assistive device contact guard for safety. Ambulated with decreased gait speed decreased step length decreased arm swing decreased trunk rotation provided verbal cues for slowing down to turn and walk at a faster speed with longer steps to reduce fall risk. Minimal assist for tandem walking. Therapies continued to work with energy conservation techniques. Continue to address balance issues as well as awareness. He worked on standing tandem with overall minimal assist as noted he was able to complete standing for longer periods of time on the right foot compared to the left. It was addressed no driving. During his stay he did receive consultation with neuropsychology in regards to issues related to his CVA. Patient remained participated with all therapies. Teaching completed and plan was discharge to home.  Medications at discharge. 1. Tylenol as needed 2. Norvasc 5 mg by mouth daily 3. Aspirin 81 mg by mouth daily 4. Lipitor 80 mg by mouth daily 5. Plavix 75 mg by mouth daily 6. Pepcid 20 mg by mouth twice a day 7. Nitroglycerin as needed 8. Ranexa 500 mg by mouth twice a day      Disposition: Discharged to home   Diet:  regular  Special Instructions:  No smoking, driving, alcohol or illicit drug products  Follow-up PCP for management of blood pressure   Discharge Instructions    Ambulatory referral to Neurology   Complete by:  As directed    An appointment is requested in approximately 4-6 weeks right PICA infarction   Ambulatory referral to Physical Medicine Rehab   Complete by:  As directed    Moderate complexity follow-up 1-2 weeks right PICA infarction      Follow-up Information    Kirsteins,  Victorino Sparrow, MD Follow up.   Specialty:  Physical Medicine and Rehabilitation Why:  office to call for appointment Contact information: [address] [place] [address] [place] [address] [phone]        [facility] Follow up.   Specialty:  General Practice Contact information: [address] [place] [address] [phone]        Laurier Nancy, MD .   Specialty:  Cardiology Contact information: [address] [place] [address] [phone]           Signed: Mcarthur Rossetti Kewana Sanon 07/14/2018, 5:39 AM

## 2018-07-14 NOTE — Progress Notes (Signed)
Pt states did not get discharge packet said Jesusita Oka A PA was in and he spoke with him and he "signed some papers". Discharge paperwork found in room with pt's belongings on night table. Packet reviewed in detail with patient. ( Pt states that he "may remember talking about some of this with PA). Pt was informed of d/c protocol for tomorrow. Pt states he needs to be gone no later than 10 am. Pt told that once he is seen by the MD in the morning he may go whenever his ride is here and that he should call the nurses/nurse tech to take him down to the exit. He denies any pain at present. Was instructed to call staff for BR assistance and was again told the risks and complications of falls. Pt's bed alarm activated. Pt remains in bed with 2 SR up call light in reach. Pt stated " I feel better thank you and I won't get up! I am going to sleep so these last hours fly by and I can go!"

## 2018-07-14 NOTE — Progress Notes (Signed)
Social Work  Discharge Note  The overall goal for the admission was met for: DC SAT 3/21  Discharge location: Yes-HOME WITH 55 YO DAUGHTER AND PARENTS CHECKING IN ON  Length of Stay: Yes-9 DAYS  Discharge activity level: Yes-MOD/I LEVEL  Home/community participation: Yes  Services provided included: MD, RD, PT, OT, RN, CM, TR, Pharmacy, Neuropsych and SW  Financial Services: Medicaid  Follow-up services arranged: Outpatient: [facility] OPPT-WILL CALL PATIENT TO SET UP APPOINTMENT, DME: [facility] and Patient/Family has no preference for HH/DME agencies  Comments (or additional information):PT DID WELL AND REACHED HIS GOALS-DECLINED SUBSTANCE ABUSE RESOURCES FEELS HAS BEEN GIVEN ANOTHER CHANCE AND WILL DO WELL-FOUND GOD.  Patient/Family verbalized understanding of follow-up arrangements: Yes  Individual responsible for coordination of the follow-up plan: SELF  Confirmed correct DME delivered: Elease Hashimoto 07/14/2018    Elease Hashimoto

## 2018-07-15 NOTE — Progress Notes (Signed)
Kenneth Morales is a 53 y.o. male who was admitted for CIR with decreased functional mobility secondary to an acute infarction right PCA territory.  Patient excited about today's discharge.  Patient remained stable.  Blood pressure has been running a bit high for the past 2 days but medications have been down titrated due to some hypotension  Subjective: No new complaints. No new problems. Slept well.  Ready for discharge  Objective: Vital signs in last 24 hours: Temp:  [98.2 F (36.8 C)-98.4 F (36.9 C)] 98.2 F (36.8 C) (03/21 0218) Pulse Rate:  [73-87] 85 (03/21 0815) Resp:  [16-18] 18 (03/21 0218) BP: (131-153)/(90-101) 131/96 (03/21 0815) SpO2:  [98 %-100 %] 100 % (03/21 0218) Weight change:  Last BM Date: 07/13/18  Intake/Output from previous day: 03/20 0701 - 03/21 0700 In: 240 [P.O.:240] Out: -    Patient Vitals for the past 24 hrs:  BP Temp Temp src Pulse Resp SpO2  07/15/18 0815 (!) 131/96 - - 85 - -  07/15/18 0218 (!) 153/93 98.2 F (36.8 C) Oral 73 18 100 %  07/14/18 1943 (!) 139/97 98.4 F (36.9 C) - 81 18 98 %  07/14/18 1401 (!) 143/90 98.2 F (36.8 C) Oral 81 16 99 %  07/14/18 1033 (!) 136/98 - - 87 - -  07/14/18 1015 (!) 140/101 - - 79 - -     Physical Exam General: No apparent distress   HEENT: not dry conjugate gaze Lungs: Normal effort. Lungs clear to auscultation, no crackles or wheezes. Cardiovascular: Regular rate and rhythm, no edema Abdomen: S/NT/ND; BS(+) Musculoskeletal:  unchanged Neurological: No new neurological deficits.  Sitting balance is intact Wounds: N/A    Skin: clear   Mental state: Alert, oriented, cooperative    Lab Results: BMET    Component Value Date/Time   NA 138 07/10/2018 0631   NA 140 08/13/2014 0742   K 4.1 07/10/2018 0631   K 3.9 08/13/2014 0742   CL 106 07/10/2018 0631   CL 102 08/13/2014 0742   CO2 25 07/10/2018 0631   CO2 28 08/13/2014 0742   GLUCOSE 195 (H) 07/10/2018 0631   GLUCOSE 93 08/13/2014 0742    BUN 18 07/10/2018 0631   BUN 18 08/13/2014 0742   CREATININE 0.94 07/14/2018 0517   CREATININE 1.12 08/13/2014 0742   CALCIUM 9.1 07/10/2018 0631   CALCIUM 9.7 08/13/2014 0742   GFRNONAA >60 07/14/2018 0517   GFRNONAA >60 08/13/2014 0742   GFRAA >60 07/14/2018 0517   GFRAA >60 08/13/2014 0742   CBC    Component Value Date/Time   WBC 8.4 07/10/2018 0631   RBC 4.57 07/10/2018 0631   HGB 15.4 07/10/2018 0631   HGB 17.2 08/13/2014 0742   HCT 44.1 07/10/2018 0631   HCT 50.4 08/13/2014 0742   PLT 272 07/10/2018 0631   PLT 241 08/13/2014 0742   MCV 96.5 07/10/2018 0631   MCV 97 08/13/2014 0742   MCH 33.7 07/10/2018 0631   MCHC 34.9 07/10/2018 0631   RDW 12.5 07/10/2018 0631   RDW 13.4 08/13/2014 0742   LYMPHSABS 1.8 07/10/2018 0631   LYMPHSABS 2.2 10/05/2012 0108   MONOABS 1.0 07/10/2018 0631   MONOABS 0.6 10/05/2012 0108   EOSABS 0.1 07/10/2018 0631   EOSABS 0.2 10/05/2012 0108   BASOSABS 0.0 07/10/2018 0631   BASOSABS 0.1 10/05/2012 0108    Medications: I have reviewed the patient's current medications.  Assessment/Plan:  Status post right PICA infarct.  Stable for discharge.  Discharged  on DAPT Essential hypertension Coronary artery disease stable Dyslipidemia continue statin therapy History of polysubstance abuse   Length of stay, days: 8  Gordy Savers , MD 07/15/2018, 9:02 AM

## 2018-07-15 NOTE — Progress Notes (Signed)
Patient discharged to home per wheelchair accompanied by NT; discharge instructions done yesterday. RN has to discuss his home meds again with the patient.Home meds from pharmacy given and signed by patient. No further questions noted.

## 2018-07-18 ENCOUNTER — Other Ambulatory Visit: Payer: Self-pay

## 2018-07-18 ENCOUNTER — Encounter: Payer: Medicaid Other | Attending: Registered Nurse | Admitting: Registered Nurse

## 2018-07-18 DIAGNOSIS — I639 Cerebral infarction, unspecified: Secondary | ICD-10-CM | POA: Diagnosis not present

## 2018-07-18 DIAGNOSIS — R269 Unspecified abnormalities of gait and mobility: Secondary | ICD-10-CM

## 2018-07-18 DIAGNOSIS — E7849 Other hyperlipidemia: Secondary | ICD-10-CM

## 2018-07-18 DIAGNOSIS — I69398 Other sequelae of cerebral infarction: Secondary | ICD-10-CM

## 2018-07-18 DIAGNOSIS — I10 Essential (primary) hypertension: Secondary | ICD-10-CM

## 2018-07-18 DIAGNOSIS — I69393 Ataxia following cerebral infarction: Secondary | ICD-10-CM

## 2018-07-18 DIAGNOSIS — F1410 Cocaine abuse, uncomplicated: Secondary | ICD-10-CM

## 2018-07-18 MED ORDER — FAMOTIDINE 20 MG PO TABS: 20.0000 mg | ORAL_TABLET | Freq: Two times a day (BID) | ORAL | 1 refills | Status: DC

## 2018-07-18 MED ORDER — NITROGLYCERIN 0.4 MG SL SUBL: SUBLINGUAL_TABLET | SUBLINGUAL | 1 refills | Status: DC

## 2018-07-18 MED ORDER — ASPIRIN 81 MG PO TABS: 81.0000 mg | ORAL_TABLET | Freq: Every day | ORAL | 2 refills | Status: DC

## 2018-07-19 NOTE — Progress Notes (Signed)
Virtual Care call  Patient name: Kenneth Morales DOB: 12-21-1965 1. Are you/is patient experiencing any problems since coming home? No a. Are there any questions regarding any aspect of care? No 2. Are there any questions regarding medications administration/dosing? [name] prescriptions was sent to [facility], placed a call to verify the above. [name] medications are ready for pick up and he verbalizes understanding.  a. Are meds being taken as prescribed? See Above Note. b. "Patient should review meds with caller to confirm" Medication List Reviewed. 3. Have there been any falls? No 4. Has Home Health been to the house and/or have they contacted you? No, he was suppose to go to outpatient Rehabilitation at [facility]. This provider emailed Becky Dupree regarding the above and she was instructed to contact [name]. a. If not, have you tried to contact them? NA b. Can we help you contact them?Yes, see above. 5. Are bowels and bladder emptying properly? Yes a. Are there any unexpected incontinence issues? No b. If applicable, is patient following bowel/bladder programs? NA 6. Any fevers, problems with breathing, unexpected pain? No 7. Are there any skin problems or new areas of breakdown? No 8. Has the patient/family member arranged specialty MD follow up (ie cardiology/neurology/renal/surgical/etc.)?  [name] states he is awiting a return call, instructed to call for HFU appointments he verbalizes understanding. Also instructed to call [facility] for HFU appointment, he verbalizes understanding. He was given address and telephone number.  a. Can we help arrange? No 9. Does the patient need any other services or support that we can help arrange? No 10. Are caregivers following through as expected in assisting the patient? He's self-care 11. Has the patient quit smoking, drinking alcohol, or using drugs as recommended? [name] denies, drinking alcohol or using illicit  drugs, he admits to smoking. Educated on smoking cessation, he verbalizes understanding.   Appointment date/time 08/14/2018  arrival time 2:00 for 2:20 appointment with Jones Bales ANP-C. At [address] [facility] [address]

## 2018-08-14 ENCOUNTER — Inpatient Hospital Stay: Payer: Medicaid Other | Admitting: Registered Nurse

## 2018-09-21 ENCOUNTER — Ambulatory Visit: Payer: Medicaid Other

## 2018-09-21 ENCOUNTER — Other Ambulatory Visit: Payer: Self-pay

## 2018-09-21 ENCOUNTER — Ambulatory Visit: Payer: Medicaid Other | Attending: Neurology | Admitting: Occupational Therapy

## 2018-09-21 DIAGNOSIS — R278 Other lack of coordination: Secondary | ICD-10-CM | POA: Diagnosis present

## 2018-09-21 DIAGNOSIS — R208 Other disturbances of skin sensation: Secondary | ICD-10-CM | POA: Insufficient documentation

## 2018-09-21 DIAGNOSIS — M6281 Muscle weakness (generalized): Secondary | ICD-10-CM

## 2018-09-21 DIAGNOSIS — I639 Cerebral infarction, unspecified: Secondary | ICD-10-CM

## 2018-09-21 NOTE — Therapy (Signed)
[facility] [address] [place], [place], [address] Phone: [phone]   Fax:  [phone]  Physical Therapy Evaluation  Patient Details  Name: Kenneth Morales MRN: [id number] Date of Birth: May 11, 1965 No data recorded  Encounter Date: 09/21/2018    Past Medical History:  Diagnosis Date  . Acute MI (HCC) MAY 2013  . CAD (coronary artery disease)    NSTEMI in 05/13 in setting of Cocaine use. Cath: 99% mid LCX stenosis with a large thrombus. PCI and BMS (4.0 X15 mm Vision) placement to mid LCX, LAD: 20%, RCA: 30%, EF: 60%.   . Cocaine abuse (HCC)    quit in 08/2011  . Depression   . Hyperlipidemia   . Hypertension   . Spider bite   . Tobacco abuse     Past Surgical History:  Procedure Laterality Date  . CARDIAC CATHETERIZATION  MAY 2013   s/p stent @ [facility]  . CARDIAC CATHETERIZATION    . CORONARY CTO INTERVENTION N/A 01/27/2018   Procedure: CORONARY CTO INTERVENTION;  Surgeon: Corky CraftsVaranasi, Jayadeep S, MD;  [facility];  Service: Cardiovascular;  Laterality: N/A;  . CORONARY STENT INTERVENTION N/A 11/29/2017   Procedure: CORONARY STENT INTERVENTION;  Surgeon: Alwyn Peaallwood, Dwayne D, MD;  Location: [facility] INVASIVE CV LAB;  Service: Cardiovascular;  Laterality: N/A;  . CORONARY STENT INTERVENTION N/A 01/27/2018   Procedure: CORONARY STENT INTERVENTION;  Surgeon: Corky CraftsVaranasi, Jayadeep S, MD;  Location: [facility] INVASIVE CV LAB;  Service: Cardiovascular;  Laterality: N/A;  mid cfx  . HEMORRHOID SURGERY    . LEFT HEART CATH AND CORONARY ANGIOGRAPHY Right 11/29/2017   Procedure: Left heart catheterization with possible PCI;  Surgeon: Laurier NancyKhan, Shaukat A, MD;  [facility];  Service: Cardiovascular;  Laterality: Right;  . LEFT HEART CATH AND CORONARY ANGIOGRAPHY N/A 01/27/2018   Procedure: LEFT HEART CATH AND CORONARY ANGIOGRAPHY;  Surgeon: Corky CraftsVaranasi, Jayadeep S, MD;  [facility];  Service:  Cardiovascular;  Laterality: N/A;    There were no vitals filed for this visit.      Pt arrived for PT evaluation for issues with gait, weakness, and L sided numbness s/p CVA 06/2018. He was evaluated by OT prior to PT evaluation and at end of OT evaluation he advised the therapist that he started to experience RUE numbness yesterday. Vitals obtained and per OT are WNL. Spoke with patient and daughter at the start of evaluation and pt reports that at approximately 13:00 yesterday he developed acute onset numbness in his RUE from mid humerus down to hand. He also developed a R sided headache around 17:00 last night. He did not seek treatment or contact his PCP. Pt reports that the numbness in his R arm "feels like my L side, feels like I had another stroke." Pt denies any other new onset symptoms yesterday or today however the RUE numbness has persisted. Denies diplopia, dysarthria, dysphagia, acute weakness, acute balance deficits. Quick screen of patient reveals mildly positive R pronator drift but facial muscles are intact and symmetrical. RUE/RLE strength appears WFL with the exception of some R grip weakness. During OT evaluation pt performed grip dynamometry and R grip strength was [facility]. Sensation diminished throughout RUE from mid humerus down through the hand. Pt encouraged to allow physical therapist to [facility]. Pt repeatedly refuses despite heavy encouragement by PT. Pt educated about the risk of not seeking immediate treatment including but  not limited to significant debility and death. Nevertheless pt continued to refuse and stated, "I will go tomorrow." Pt taken to the front office to reschedule his physical therapy evaluation. Pt left today with the care of his daughter. Physical therapist contacted medical staff at [facility] and left message with above information to inform PCP. Will perform PT evaluation on later date/time after pt  has been assessed to rule out further CVA.                     Patient will benefit from skilled therapeutic intervention in order to improve the following deficits and impairments:     Visit Diagnosis: Cerebellar infarction [facility])     Problem List Patient Active Problem List   Diagnosis Date Noted  . Ataxia, post-stroke   . Gait disturbance, post-stroke   . Intractable hiccoughs   . 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 Stroke (cerebrum) (HCC) 07/04/2018  . Unstable angina (HCC) 01/26/2018  . NSTEMI (non-ST elevated myocardial infarction) (HCC) 11/28/2017  . 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 Chest pain 07/01/2016  . SOB (shortness of breath) 12/10/2011  . CAD (coronary artery disease)   . Hyperlipidemia   . Hypertension   . Tobacco abuse   . Cocaine abuse [facility])    Sharalyn Ink Sherronda Sweigert PT, DPT, GCS  Masen Salvas 09/21/2018, 1:33 PM  [place] [facility] MAIN [facility] SERVICES [address] [place], [place], [address] Phone: [phone]   Fax:  [phone]  Name: Kenneth Morales MRN: [id number] Date of Birth: 1966/01/17

## 2018-09-24 ENCOUNTER — Encounter: Payer: Self-pay | Admitting: Occupational Therapy

## 2018-09-24 NOTE — Therapy (Signed)
[place] [facility] MAIN [facility] SERVICES [address] [place], [place], [address] Phone: [phone]   Fax:  [phone]  Occupational Therapy Evaluation  Patient Details  Name: Kenneth Morales MRN: [id number] Date of Birth: 1965-07-19 No data recorded  Encounter Date: 09/21/2018  OT End of Session - 09/24/18 1610    Visit Number  1    Number of Visits  24    Date for OT Re-Evaluation  12/14/18    Authorization Type  Medicaid    OT Start Time  1030    OT Stop Time  1129    OT Time Calculation (min)  59 min    Activity Tolerance  Patient tolerated treatment well    Behavior During Therapy  [facility] for tasks assessed/performed       Past Medical History:  Diagnosis Date  . Acute MI (HCC) MAY 2013  . CAD (coronary artery disease)    NSTEMI in 05/13 in setting of Cocaine use. Cath: 99% mid LCX stenosis with a large thrombus. PCI and BMS (4.0 X15 mm Vision) placement to mid LCX, LAD: 20%, RCA: 30%, EF: 60%.   . Cocaine abuse (HCC)    quit in 08/2011  . Depression   . Hyperlipidemia   . Hypertension   . Spider bite   . Tobacco abuse     Past Surgical History:  Procedure Laterality Date  . CARDIAC CATHETERIZATION  MAY 2013   s/p stent @ [facility]  . CARDIAC CATHETERIZATION    . CORONARY CTO INTERVENTION N/A 01/27/2018   Procedure: CORONARY CTO INTERVENTION;  Surgeon: Corky Crafts, MD;  [facility] CV LAB;  Service: Cardiovascular;  Laterality: N/A;  . CORONARY STENT INTERVENTION N/A 11/29/2017   Procedure: CORONARY STENT INTERVENTION;  Surgeon: Alwyn Pea, MD;  Location: [facility] INVASIVE CV LAB;  Service: Cardiovascular;  Laterality: N/A;  . CORONARY STENT INTERVENTION N/A 01/27/2018   Procedure: CORONARY STENT INTERVENTION;  Surgeon: Corky Crafts, MD;  [facility]: [facility] CV LAB;  Service: Cardiovascular;  Laterality: N/A;  mid cfx  . HEMORRHOID SURGERY    . LEFT HEART CATH AND CORONARY ANGIOGRAPHY Right 11/29/2017   Procedure: Left heart catheterization with possible PCI;  Surgeon: Laurier Nancy, MD;  Location: [facility] INVASIVE CV LAB;  Service: Cardiovascular;  Laterality: Right;  . LEFT HEART CATH AND CORONARY ANGIOGRAPHY N/A 01/27/2018   Procedure: LEFT HEART CATH AND CORONARY ANGIOGRAPHY;  Surgeon: Corky Crafts, MD;  Location: [facility] INVASIVE CV LAB;  Service: Cardiovascular;  Laterality: N/A;    There were no vitals filed for this visit.  Subjective Assessment - 09/24/18 1552    Subjective   Patient reports he had a stroke about 2 months ago and has had 3 heart attacks (2 last year).      Pertinent History  Pt reports he was sleeping and when he woke he couldn't stand, came to ER but was sent home.  Came back the next day, was admitted and sent to [facility].  He was in rehab for 3 weeks and then discharged home.  Patient now presents for OP OT evaluation.      Patient Stated Goals  Patient reports he wants to get back to doing everything he did before.     Currently in Pain?  No/denies    Multiple Pain Sites  No        [facility] OT Assessment - 09/24/18 1555      Assessment   Medical Diagnosis  CVA  Hand Dominance  Right    Prior Therapy  OT, PT      Balance Screen   Has the patient fallen in the past 6 months  Yes    How many times?  once during stroke    Has the patient had a decrease in activity level because of a fear of falling?   No    Is the patient reluctant to leave their home because of a fear of falling?   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      ADL   Eating/Feeding  Independent    Grooming  Independent    Upper Body Bathing  Modified independent    Lower Body Bathing  Modified independent    Upper Body Dressing  Increased time    Lower Body Dressing  Increased time    Toilet Transfer  Modified independent    Toileting - Clothing Manipulation  Increased time    Toileting -  Hygiene  Increase time    Tub/Shower Transfer  Modified independent    ADL comments  Patient reports difficulty with sleeping at night, he feels he can sleep during the day but feels restless at night.  He is not able to drive since his CVA, reports he doesn't feel comfortable or safe. His father is currently driving him.  Pt lives with his daughter who is 26.  He requires assistance with homemaking tasks, shopping.        IADL   Prior Level of Function Shopping  independent    Shopping  Assistance for transportation    Prior Level of Function Light Housekeeping  independent    Light Housekeeping  Needs help with all home maintenance tasks    Prior Level of Function Meal Prep  independent    Meal Prep  Plans, prepares and serves adequate meals independently    Prior Level of Function Community Mobility  independent    Community Mobility  Relies on family or friends for transportation    Prior Level of Function Medication Managment  independent    Medication Management  Has difficulty remembering to take medication    Prior Level of Function Financial Management  independent    Physicist, medical financial matters independently (budgets, writes checks, pays rent, bills goes to bank), collects and keeps track of income      Mobility   Mobility Status Comments  feels unsteady with his balance at times.       Written Expression   Dominant Hand  Right      Vision - History   Additional Comments  Patient reports when he first had the stroke he was seeing double but now  blurred.        Vision Assessment   Comment  Vision to  be further assessed  Patient denies diplopia, reports some blurred vision, scanning patterns random, right to left and top to bottom.  Missed 3 letters on letter cancellation task.       Cognition   Overall Cognitive Status  Within Functional Limits for tasks assessed    Cognition Comments  Patient denies any issues with memory or thinking skills, will continue to assess      Sensation   Light Touch  Impaired Detail    Stereognosis  Appears Intact    Hot/Cold  Impaired by gross assessment    Additional Comments  Pt reports numbness on left side of the body, whole arm, mouth.  Patient with diminished hot/cold on left as well as diminished light touch around left elbow.        Coordination   Finger Nose Finger Test  slightly diminished on left    9 Hole Peg Test  Right;Left    Right 9 Hole Peg Test  24 secs    Left 9 Hole Peg Test  26 sec      Hand Function   Right Hand Grip (lbs)  85    Right Hand Lateral Pinch  24 lbs    Right Hand 3 Point Pinch  25 lbs    Left Hand Grip (lbs)  64    Left Hand Lateral Pinch  17 lbs    Left 3 point pinch  17 lbs        Bilateral UE WFLs RUE 5/5 LUE 4/5 overall  Towards end of session patient mentioned his right arm started to feel numb and tingly like the left arm, starting the day before, reports mild headache last day but no pain this date.  He denies any other symptoms.  Advised patient he should seek medical attention however he did not feel it was necessary and did not want to go to the ER to be checked.   Therapist let PT know who was scheduled to see patient for an evaluation.  He also agreed patient would benefit from following up regarding new onset of numbness on right UE however patient again declined.  States, "I will wait until tomorrow to go".  PT discussed risks and patient was aware.    BP taken 105/71. Weight 183              OT Education - 09/24/18 1610     Education Details  role of OT, goals     Person(s) Educated  Patient    Methods  Explanation    Comprehension  Verbalized understanding          OT Long Term Goals - 09/24/18 1629      OT LONG TERM GOAL #1   Title  Patient will improve left grip strength by 10# to open jars and containers with modified independence.      Baseline  decreased grip on left    Time  12    Period  Weeks    Status  New    Target Date  12/14/18      OT LONG TERM GOAL #2   Title  Patient will complete light housekeeping tasks with modified independence and good safety awareness    Baseline  daughter helping with home management tasks at eval, patient is not currently performing    Time  12    Period  Weeks    Status  New    Target Date  12/14/18      OT LONG TERM  GOAL #3   Title  Patient will complete meal preparation with modified independence.     Baseline  requires moderate assist at eval    Time  12    Period  Weeks    Status  New    Target Date  12/14/18      OT LONG TERM GOAL #4   Title  Patient will be able to carry 1 gallon of milk from grocery store to the car with modified independence.      Baseline  able to carry light bag but unable to carry gallon of milk.     Time  8    Period  Weeks    Status  New    Target Date  12/14/18      OT LONG TERM GOAL #5   Title  Patient will demonstrate compensatory strategies for safety with diminished sensation in left UE to complete ADL tasks safely.     Baseline  diminished hot/cold and light touch on left UE.    Time  8    Period  Weeks    Status  New    Target Date  12/14/18            Plan - 09/24/18 1611    Clinical Impression Statement  Patient is a 53 yo male who suffered a CVA on 07/04/18.  Pt was hospitalized and had inpatient rehab for 3 weeks and discharged on   Patient presents with decreased strength, activity tolerance, mildly decreased coordination, decreased sensation, decreased balance, decreased ability to perform self  care and IADL tasks.  Patient would benefit from skilled OT services to maximize safety and independence in daily tasks.      OT Occupational Profile and History  Detailed Assessment- Review of Records and additional review of physical, cognitive, psychosocial history related to current functional performance    Occupational performance deficits (Please refer to evaluation for details):  ADL's;IADL's;Rest and Sleep;Social Participation    Body Structure / Function / Physical Skills  ADL;Coordination;Endurance;GMC;UE functional use;Balance;Sensation;IADL;Vision;FMC;Strength;Dexterity;ROM    Psychosocial Skills  Environmental  Adaptations;Habits;Routines and Behaviors    Rehab Potential  Good    Clinical Decision Making  Several treatment options, min-mod task modification necessary    Comorbidities Affecting Occupational Performance:  May have comorbidities impacting occupational performance    Modification or Assistance to Complete Evaluation   Min-Moderate modification of tasks or assist with assess necessary to complete eval    OT Frequency  2x / week    OT Duration  12 weeks    OT Treatment/Interventions  Self-care/ADL training;Therapeutic exercise;Visual/perceptual remediation/compensation;Moist Heat;Neuromuscular education;Patient/family education;Building services engineer;Therapeutic activities;Balance training;Contrast Bath;DME and/or AE instruction    Consulted and Agree with Plan of Care  Patient       Patient will benefit from skilled therapeutic intervention in order to improve the following deficits and impairments:  Body Structure / Function / Physical Skills, Psychosocial Skills  Visit Diagnosis: Muscle weakness (generalized)  Cerebellar infarction (HCC)  Other lack of coordination  Other disturbances of skin sensation    Problem List Patient Active Problem List   Diagnosis Date Noted  . Ataxia, post-stroke   . Gait disturbance, post-stroke   . Intractable hiccoughs    . 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 Stroke (cerebrum) (HCC) 07/04/2018  . Unstable angina (HCC) 01/26/2018  . NSTEMI (non-ST elevated myocardial infarction) (HCC) 11/28/2017  . 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 Chest pain 07/01/2016  . SOB (shortness of breath) 12/10/2011  . CAD (  coronary artery disease)   . Hyperlipidemia   . Hypertension   . Tobacco abuse   . Cocaine abuse [facility])    Amy Cornelius Moras, OTR/L, CLT  Lovett,Amy 09/24/2018, 4:42 PM  [place] [facility] MAIN [facility] SERVICES [address] [place], [place], [address] Phone: [phone]   Fax:  [phone]  Name: Kenneth Morales MRN: [id number] Date of Birth: 08-05-1965

## 2018-09-28 ENCOUNTER — Encounter: Payer: Medicaid Other | Admitting: Occupational Therapy

## 2018-10-03 ENCOUNTER — Other Ambulatory Visit: Payer: Self-pay

## 2018-10-03 ENCOUNTER — Ambulatory Visit: Payer: Medicaid Other | Admitting: Physical Therapy

## 2018-10-03 ENCOUNTER — Ambulatory Visit: Payer: Medicaid Other | Attending: Neurology | Admitting: Occupational Therapy

## 2018-10-03 ENCOUNTER — Encounter: Payer: Self-pay | Admitting: Occupational Therapy

## 2018-10-03 ENCOUNTER — Encounter: Payer: Self-pay | Admitting: Physical Therapy

## 2018-10-03 VITALS — BP 171/98 | HR 74

## 2018-10-03 DIAGNOSIS — R262 Difficulty in walking, not elsewhere classified: Secondary | ICD-10-CM | POA: Insufficient documentation

## 2018-10-03 DIAGNOSIS — M6281 Muscle weakness (generalized): Secondary | ICD-10-CM | POA: Diagnosis present

## 2018-10-03 DIAGNOSIS — R278 Other lack of coordination: Secondary | ICD-10-CM | POA: Diagnosis present

## 2018-10-03 NOTE — Therapy (Signed)
[facility] MAIN [facility] SERVICES [address] [place], [place], [address] Phone: [phone]   Fax:  [phone]  Physical Therapy Evaluation  Patient Details  Name: Kenneth Morales MRN: [id number] Date of Birth: Feb 13, 1966 Referring Provider (PT): Dr. Malvin Johns   Encounter Date: 10/03/2018  PT End of Session - 10/03/18 1115    Visit Number  1    Number of Visits  9    Date for PT Re-Evaluation  11/28/18    Authorization Type  medicaid    PT Start Time  1015    PT Stop Time  1115    PT Time Calculation (min)  60 min    Equipment Utilized During Treatment  Gait belt    Activity Tolerance  Other (comment)    Behavior During Therapy  WFL for tasks assessed/performed       Past Medical History:  Diagnosis Date  . Acute MI (HCC) MAY 2013  . CAD (coronary artery disease)    NSTEMI in 05/13 in setting of Cocaine use. Cath: 99% mid LCX stenosis with a large thrombus. PCI and BMS (4.0 X15 mm Vision) placement to mid LCX, LAD: 20%, RCA: 30%, EF: 60%.   . Cocaine abuse (HCC)    quit in 08/2011  . Depression   . Hyperlipidemia   . Hypertension   . Spider bite   . Tobacco abuse     Past Surgical History:  Procedure Laterality Date  . CARDIAC CATHETERIZATION  MAY 2013   s/p stent @ [facility]  . CARDIAC CATHETERIZATION    . CORONARY CTO INTERVENTION N/A 01/27/2018   Procedure: CORONARY CTO INTERVENTION;  Surgeon: Corky Crafts, MD;  Location: [facility] INVASIVE CV LAB;  Service: Cardiovascular;  Laterality: N/A;  . CORONARY STENT INTERVENTION N/A 11/29/2017   Procedure: CORONARY STENT INTERVENTION;  Surgeon: Alwyn Pea, MD;  Location: [facility] INVASIVE CV LAB;  Service: Cardiovascular;  Laterality: N/A;  . CORONARY STENT INTERVENTION N/A 01/27/2018   Procedure: CORONARY STENT INTERVENTION;  Surgeon: Corky Crafts, MD;  Location: [facility] INVASIVE CV LAB;  Service: Cardiovascular;  Laterality: N/A;  mid cfx  . HEMORRHOID SURGERY    . LEFT HEART CATH  AND CORONARY ANGIOGRAPHY Right 11/29/2017   Procedure: Left heart catheterization with possible PCI;  Surgeon: Laurier Nancy, MD;  Location: [facility] INVASIVE CV LAB;  Service: Cardiovascular;  Laterality: Right;  . LEFT HEART CATH AND CORONARY ANGIOGRAPHY N/A 01/27/2018   Procedure: LEFT HEART CATH AND CORONARY ANGIOGRAPHY;  Surgeon: Corky Crafts, MD;  Location: [facility] INVASIVE CV LAB;  Service: Cardiovascular;  Laterality: N/A;    Vitals:   10/03/18 1042 10/03/18 1100 10/03/18 1106  BP: (!) 138/101 (!) 159/108 (!) 171/98  Pulse: 74    SpO2: 93%       Subjective Assessment - 10/03/18 1026    Subjective  "I really want the numbness on my left side to go away but I don't think that it ever will."     Pertinent History  53 yo Male diagnosed with CVA on 07/04/18 with difficulty walking, weakness on left side and slurred speech. Patient reports on Tuesday 3/10 he was unable to walk at all. Currently he is able to ambulate short distances without any assistive device; He denies any new falls. Patient lives in an apartment with stairs inside. he reports that he has been trying to negotiate steps more but does get fatigued. He is able to negotiate steps with reciprocal pattern but does require  increased time; He presents to therapy with increased BP but states that he just took his medicine about an hour before therapy; Denies any symptoms including chest pain, shortness of breath or dizziness;     Limitations  Walking;Standing    How long can you sit comfortably?  NA    How long can you stand comfortably?  20 min; requires multiple rest breaks;     How long can you walk comfortably?  about 500 feet with rest breaks;     Diagnostic tests  MRI on 3/10 shows Multifocal acute ischemic infarcts of the right cerebellum    Patient Stated Goals  reduce numbness on left side; Improve strength and walking;          [facility] PT Assessment - 10/03/18 0001      Assessment   Medical Diagnosis  CVA    Referring  Provider (PT)  Dr. Malvin JohnsPotter    Onset Date/Surgical Date  07/04/18    Hand Dominance  Right    Next MD Visit  about a month    Prior Therapy  acute care PT and home health PT for about 2 weeks; denies any outpatient PT for stroke;       Precautions   Precautions  Fall    Required Braces or Orthoses  --   none     Restrictions   Weight Bearing Restrictions  No      Balance Screen   Has the patient fallen in the past 6 months  Yes    How many times?  1    Has the patient had a decrease in activity level because of a fear of falling?   Yes    Is the patient reluctant to leave their home because of a fear of falling?   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    Leisure  play music; pt plays piano and guitar      Cognition   Overall Cognitive Status  Within Functional Limits for tasks assessed      Observation/Other Assessments   Cranial Nerve(s)  appears intact except impaired trigeminal on left side (numbness on left side of face)    Other Surveys   Lower Extremity Functional Scale      Sensation   Light Touch  Impaired by gross assessment    Additional Comments  numbness reported on left side of body; able to identify light touch but impaired compared to right side; intact deep pressure      Coordination   Gross Motor Movements are Fluid and Coordinated  Yes    Fine Motor Movements are Fluid and Coordinated  Yes    Finger Nose Finger Test  accurate bilaterally;      Posture/Postural Control   Posture Comments  pt sits with mild slumped posture, able to self correct with verbal cues;       Deep Tendon  Reflexes   DTR Assessment Site  Patella;Achilles    Patella DTR  2+    Achilles DTR  2+  AROM   Overall AROM Comments  BUE and BLE are [facility]      Strength   Right Hip Flexion  4+/5    Right Hip Extension  3+/5    Right Hip ABduction  4-/5    Right Hip ADduction  4-/5    Left Hip Flexion  4-/5    Left Hip Extension  3+/5    Left Hip ABduction  3+/5    Left Hip ADduction  4-/5    Right Knee Flexion  5/5    Right Knee Extension  5/5    Left Knee Flexion  5/5    Left Knee Extension  5/5    Right Ankle Dorsiflexion  5/5    Right Ankle Plantar Flexion  4/5    Left Ankle Dorsiflexion  5/5    Left Ankle Plantar Flexion  4/5      Transfers   Comments  pt able to transfer mod I with arm rests;       Ambulation/Gait   Gait Comments  ambulates with reciprocal gait pattern, without AD, good foot clearance bilaterally, normal base of support, slightly slower gait speed;      6 minute walk test results    Endurance additional comments  unable to complete today due to elevated BP      Standardized Balance Assessment   10 Meter Walk  1.0 m/s without AD, limited community ambulator      High Level Balance   High Level Balance Comments  static standing balance is fair, dynamic standing balance is fair;                Objective measurements completed on examination: See above findings.    Will address HEP next visit; Withheld additional gait and dynamic balance testing due to elevated BP; recommended patient follow up with PCP for better medication management;           PT Education - 10/03/18 1115    Education Details  POC, recommendations, follow up with BP    Person(s) Educated  Patient    Methods  Explanation    Comprehension  Verbalized understanding       PT Short Term Goals - 10/03/18 1144      PT SHORT TERM GOAL #1   Title  Patient will be adherent to HEP at least 3x a week to improve functional strength and balance for better safety at home.     Baseline  does not have a HEP    Time  4    Period  Weeks    Status  New    Target Date  10/31/18        PT Long Term Goals - 10/03/18 1144      PT LONG TERM GOAL #1   Title  Patient will increase 10 meter walk test to >1.2 m/s as to improve gait speed for better community ambulation and to reduce fall risk.    Baseline  10/03/18: 1.0 m/s    Time  8    Period  Weeks    Status  New    Target Date  11/28/18      PT LONG TERM GOAL #2   Title  Patient will increase six minute walk test distance to >1000 for progression to community ambulator and improve gait ability    Baseline  unable to complete at evaluation due to elevated BP    Time  8    Period  Weeks    Status  New    Target Date  11/28/18      PT LONG TERM GOAL #3   Title  Patient will increase Functional Gait Assessment score to >20/30 as to reduce fall risk and improve dynamic gait safety with community ambulation.    Baseline  unable to complete at PT evaluation due to elevated BP    Time  8    Period  Weeks    Status  New    Target Date  11/28/18      PT LONG TERM GOAL #4   Title  Patient will increase BLE gross strength to 4+/5 as to improve functional strength for independent gait, increased standing tolerance and increased ADL ability.    Baseline  10/03/18: LLE hip grossly 3+ to 4-/5    Time  8    Period  Weeks    Status  New    Target Date  11/28/18             Plan - 10/03/18 1125    Clinical Impression Statement  53 yo Male s/p CVA in March 2020 with residual left sided weakness. Patient presents to therapy today with elevated BP which limited tolerance with session. he reports adherence to medication management but despite sitting rest break continues to have elevated BP. Patient exhibits mild weakness on left side compared to right side. When walking he is able to clear both feet effectively with good reciprocal gait pattern. He exhibits fair balance in standing with good control unsupported. Patient  does fatigue with prolonged standing and walking. Unable to complete advanced balance/walking tests today due to elevated BP. Recommended patient follow up with PCP for additional management. Patient would benefit from skilled PT intervention to improve strength and mobility;     Personal Factors and Comorbidities  Comorbidity 1;Comorbidity 2;Education;Transportation;Comorbidity 3+    Comorbidities  HTN (not controlled), heart disease with recent MI in last year, recent stroke, current smoker    Examination-Activity Limitations  Carry;Lift;Locomotion Level;Squat;Stairs;Stand;Transfers    Examination-Participation Restrictions  Cleaning;Community Activity;Driving;Laundry;Medication Management;Meal Prep    Stability/Clinical Decision Making  Evolving/Moderate complexity    Clinical Decision Making  Moderate    Rehab Potential  Fair    PT Frequency  1x / week    PT Duration  8 weeks    PT Treatment/Interventions  Cryotherapy;Moist Heat;Gait training;Stair training;Functional mobility training;Therapeutic activities;Therapeutic exercise;Balance training;Neuromuscular re-education;Patient/family education;Energy conservation    PT Next Visit Plan  assess vitals, 6 min walk, FGA, address HEP    PT Home Exercise Plan  will address next visit    Consulted and Agree with Plan of Care  Patient       Patient will benefit from skilled therapeutic intervention in order to improve the following deficits and impairments:  Cardiopulmonary status limiting activity, Decreased activity tolerance, Decreased balance, Decreased endurance, Decreased knowledge of precautions, Decreased mobility, Decreased safety awareness, Decreased strength, Difficulty walking  Visit Diagnosis: Muscle weakness (generalized)  Difficulty in walking, not elsewhere classified     Problem List Patient Active Problem List   Diagnosis Date Noted  . Ataxia, post-stroke   . Gait disturbance, post-stroke   . Intractable hiccoughs    . Cerebellar infarction ([place]) 07/07/2018  . Stroke (cerebrum) ([place]) 07/04/2018  . Unstable angina ([place]) 01/26/2018  . NSTEMI (non-ST elevated myocardial infarction) ([place]) 11/28/2017  . 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 Chest pain 07/01/2016  . SOB (shortness of breath) 12/10/2011  . CAD (coronary artery disease)   . Hyperlipidemia   .  Hypertension   . Tobacco abuse   . Cocaine abuse (HCC)     Kielan Dreisbach PT, DPT 10/03/2018, 11:47 AM   [facility] [address] [place], [place], [address] Phone: [phone]   Fax:  [phone]  Name: Kenneth Morales MRN: [id number] Date of Birth: 11-07-1965

## 2018-10-03 NOTE — Therapy (Signed)
[facility] [address] [place], [place], [address] Phone: [phone]   Fax:  [phone]  Occupational Therapy Treatment  Patient Details  Name: Kenneth Morales MRN: [id number] Date of Birth: 1966-01-13 No data recorded  Encounter Date: 10/03/2018  OT End of Session - 10/03/18 0948    Visit Number  2    Number of Visits  24    Date for OT Re-Evaluation  12/14/18    Authorization Type  Medicaid    OT Start Time  0930    OT Stop Time  1015    OT Time Calculation (min)  45 min    Activity Tolerance  Patient tolerated treatment well    Behavior During Therapy  [facility]FL for tasks assessed/performed       Past Medical History:  Diagnosis Date  . Acute MI (HCC) MAY 2013  . CAD (coronary artery disease)    NSTEMI in 05/13 in setting of Cocaine use. Cath: 99% mid LCX stenosis with a large thrombus. PCI and BMS (4.0 X15 mm Vision) placement to mid LCX, LAD: 20%, RCA: 30%, EF: 60%.   . Cocaine abuse (HCC)    quit in 08/2011  . Depression   . Hyperlipidemia   . Hypertension   . Spider bite   . Tobacco abuse     Past Surgical History:  Procedure Laterality Date  . CARDIAC CATHETERIZATION  MAY 2013   s/p stent @ [facility]  . CARDIAC CATHETERIZATION    . CORONARY CTO INTERVENTION N/A 01/27/2018   Procedure: CORONARY CTO INTERVENTION;  Surgeon: Corky CraftsVaranasi, Jayadeep S, MD;  [facility];  Service: Cardiovascular;  Laterality: N/A;  . CORONARY STENT INTERVENTION N/A 11/29/2017   Procedure: CORONARY STENT INTERVENTION;  Surgeon: Alwyn Peaallwood, Dwayne D, MD;  Location: [facility] INVASIVE CV LAB;  Service: Cardiovascular;  Laterality: N/A;  . CORONARY STENT INTERVENTION N/A 01/27/2018   Procedure: CORONARY STENT INTERVENTION;  Surgeon: Corky CraftsVaranasi, Jayadeep S, MD;  Location: [facility] INVASIVE CV LAB;  Service: Cardiovascular;  Laterality: N/A;  mid cfx  . HEMORRHOID SURGERY    . LEFT HEART CATH AND CORONARY ANGIOGRAPHY Right 11/29/2017   Procedure: Left heart catheterization with possible PCI;  Surgeon: Laurier NancyKhan, Shaukat A, MD;  [facility];  Service: Cardiovascular;  Laterality: Right;  . LEFT HEART CATH AND CORONARY ANGIOGRAPHY N/A 01/27/2018   Procedure: LEFT HEART CATH AND CORONARY ANGIOGRAPHY;  Surgeon: Corky CraftsVaranasi, Jayadeep S, MD;  [facility];  Service: Cardiovascular;  Laterality: N/A;    There were no vitals filed for this visit.  Subjective Assessment - 10/03/18 0946    Subjective   Pt. reports that the accuracy of his home BP machine is way off.    Pertinent History  Pt reports he was sleeping and when he woke he couldn't stand, came to ER but was sent home.  Came back the next day, [facility]  He was in rehab for 3 weeks and then discharged home.  Patient now presents for OP OT evaluation.      Patient Stated Goals  Patient reports he wants to get back to doing everything he did before.     Currently in Pain?  No/denies       OT TREATMENT    Therapeutic Exercise:  Pt. performed left gross gripping with grip strengthener. Pt. worked on sustaining grip while grasping pegs and placing them in a container set to the left on  a tabletop.  Gripper was placed in the 3rd resistive slot with the white resistive spring. Pt. Worked on pinch strengthening in the left hand for lateral, and 3pt. pinch using yellow, red, green, and blue, black resistive clips. Pt. worked on placing the clips at various vertical and horizontal angles. Tactile and verbal cues were required for eliciting the desired movement. Pt. Performed hand strengthening with pink theraputty. Pt. required cues for proper technique. Pt. worked on gross grip, lateral pinch, 3pt. pinch, gross digit extension, thumb opposition.  Pt. required verbal and tactile cues, and visual demonstration were required for proper technique.                       OT Education - 10/03/18 1106    Education Details  OT,  UE strebngthening, theraputty ex    Person(s) Educated  Patient    Methods  Explanation    Comprehension  Verbalized understanding          OT Long Term Goals - 09/24/18 1629      OT LONG TERM GOAL #1   Title  Patient will improve left grip strength by 10# to open jars and containers with modified independence.      Baseline  decreased grip on left    Time  12    Period  Weeks    Status  New    Target Date  12/14/18      OT LONG TERM GOAL #2   Title  Patient will complete light housekeeping tasks with modified independence and good safety awareness    Baseline  daughter helping with home management tasks at eval, patient is not currently performing    Time  12    Period  Weeks    Status  New    Target Date  12/14/18      OT LONG TERM GOAL #3   Title  Patient will complete meal preparation with modified independence.     Baseline  requires moderate assist at eval    Time  12    Period  Weeks    Status  New    Target Date  12/14/18      OT LONG TERM GOAL #4   Title  Patient will be able to carry 1 gallon of milk from grocery store to the car with modified independence.      Baseline  able to carry light bag but unable to carry gallon of milk.     Time  8    Period  Weeks    Status  New    Target Date  12/14/18      OT LONG TERM GOAL #5   Title  Patient will demonstrate compensatory strategies for safety with diminished sensation in left UE to complete ADL tasks safely.     Baseline  diminished hot/cold and light touch on left UE.    Time  8    Period  Weeks    Status  New    Target Date  12/14/18            Plan - 10/03/18 0949    Clinical Impression Statement  Pt. presents with elevated BP 138/103, HR: 75, At the end of the session BP 143/100 HR 75. pt. has a wrist cuff BP monitor which pt. is concerned about the accuracy of. Pt. presents with limited LUE strength, and [facility] skills. Pt. is making progress with engaging his left hand during ADLs, and IADLs,.  Pt. reports having played the piano at a consert for 4 hours over the weekend. Pt. conitnues to work on improving  LUE functioning in preparatrion  for improved us during ADLs, and IADLs.     OT Occupational Profile and History  Detailed Assessment- Review of Records and additional review of physical, cognitive, psychosocial history related to current functional performance    Occupational performance deficits (Please refer to evaluation for details):  ADL's;IADL's;Rest and Sleep;Social Participation    Body Structure / Function / Physical Skills  ADL;Coordination;Endurance;[facility];UE functional use;Balance;Sensation;IADL;Vision;[facility];Strength;Dexterity;ROM    Psychosocial Skills  Environmental  Adaptations;Habits;Routines and Behaviors    Rehab Potential  Good    Clinical Decision Making  Several treatment options, min-mod task modification necessary    Comorbidities Affecting Occupational Performance:  May have comorbidities impacting occupational performance    Modification or Assistance to Complete Evaluation   Min-Moderate modification of tasks or assist with assess necessary to complete eval    OT Frequency  2x / week    OT Duration  12 weeks    OT Treatment/Interventions  Self-care/ADL training;Therapeutic exercise;Visual/perceptual remediation/compensation;Moist Heat;Neuromuscular education;Patient/family education;Building services engineerunctional Mobility Training;Therapeutic activities;Balance training;Contrast Bath;DME and/or AE instruction    Consulted and Agree with Plan of Care  Patient       Patient will benefit from skilled therapeutic intervention in order to improve the following deficits and impairments:  Body Structure / Function / Physical Skills, Psychosocial Skills  Visit Diagnosis: Muscle weakness (generalized)  Other lack of coordination    Problem List Patient Active Problem List   Diagnosis Date Noted  . Ataxia, post-stroke   . Gait disturbance, post-stroke   . Intractable hiccoughs   .  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 Stroke (cerebrum) (HCC) 07/04/2018  . Unstable angina (HCC) 01/26/2018  . NSTEMI (non-ST elevated myocardial infarction) (HCC) 11/28/2017  . 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 Chest pain 07/01/2016  . SOB (shortness of breath) 12/10/2011  . CAD (coronary artery disease)   . Hyperlipidemia   . Hypertension   . Tobacco abuse   . Cocaine abuse (HCC)     Olegario MessierElaine Lateshia Schmoker, MS, OTR/L 10/03/2018, 11:12 AM  [facility] [address] [place], [place], [address] Phone: [phone]   Fax:  [phone]  Name: Kenneth Morales MRN: [id number] Date of Birth: 02-25-1966

## 2018-10-12 ENCOUNTER — Ambulatory Visit: Payer: Medicaid Other

## 2018-10-12 ENCOUNTER — Encounter: Payer: Self-pay | Admitting: Occupational Therapy

## 2018-10-12 ENCOUNTER — Other Ambulatory Visit: Payer: Self-pay

## 2018-10-12 ENCOUNTER — Ambulatory Visit: Payer: Medicaid Other | Admitting: Occupational Therapy

## 2018-10-12 DIAGNOSIS — R278 Other lack of coordination: Secondary | ICD-10-CM

## 2018-10-12 DIAGNOSIS — R262 Difficulty in walking, not elsewhere classified: Secondary | ICD-10-CM

## 2018-10-12 DIAGNOSIS — M6281 Muscle weakness (generalized): Secondary | ICD-10-CM | POA: Diagnosis not present

## 2018-10-12 NOTE — Therapy (Signed)
[facility] [address] [place], [place], [address] Phone: [phone]   Fax:  [phone]  Physical Therapy Treatment  Patient Details  Name: Kenneth Morales MRN: [id number] Date of Birth: 07-23-1965 Referring Provider (PT): Dr. Malvin JohnsPotter   Encounter Date: 10/12/2018  PT End of Session - 10/12/18 1009    Visit Number  2    Number of Visits  9    Date for PT Re-Evaluation  11/28/18    Authorization Type  medicaid    PT Start Time  0914    PT Stop Time  1000    PT Time Calculation (min)  46 min    Equipment Utilized During Treatment  Gait belt    Activity Tolerance  Other (comment)   limited by BP   Behavior During Therapy  [facility] for tasks assessed/performed       Past Medical History:  Diagnosis Date  . Acute MI (HCC) MAY 2013  . CAD (coronary artery disease)    NSTEMI in 05/13 in setting of Cocaine use. Cath: 99% mid LCX stenosis with a large thrombus. PCI and BMS (4.0 X15 mm Vision) placement to mid LCX, LAD: 20%, RCA: 30%, EF: 60%.   . Cocaine abuse (HCC)    quit in 08/2011  . Depression   . Hyperlipidemia   . Hypertension   . Spider bite   . Tobacco abuse     Past Surgical History:  Procedure Laterality Date  . CARDIAC CATHETERIZATION  MAY 2013   s/p stent @ [facility]  . CARDIAC CATHETERIZATION    . CORONARY CTO INTERVENTION N/A 01/27/2018   Procedure: CORONARY CTO INTERVENTION;  Surgeon: Corky CraftsVaranasi, Jayadeep S, MD;  [facility];  Service: Cardiovascular;  Laterality: N/A;  . CORONARY STENT INTERVENTION N/A 11/29/2017   Procedure: CORONARY STENT INTERVENTION;  Surgeon: Alwyn Peaallwood, Dwayne D, MD;  Location: [facility] INVASIVE CV LAB;  Service: Cardiovascular;  Laterality: N/A;  . CORONARY STENT INTERVENTION N/A 01/27/2018   Procedure: CORONARY STENT INTERVENTION;  Surgeon: Corky CraftsVaranasi, Jayadeep S, MD;  Location: [facility] INVASIVE CV LAB;  Service: Cardiovascular;  Laterality: N/A;  mid cfx  . HEMORRHOID SURGERY    .  LEFT HEART CATH AND CORONARY ANGIOGRAPHY Right 11/29/2017   Procedure: Left heart catheterization with possible PCI;  Surgeon: Laurier NancyKhan, Shaukat A, MD;  [facility];  Service: Cardiovascular;  Laterality: Right;  . LEFT HEART CATH AND CORONARY ANGIOGRAPHY N/A 01/27/2018   Procedure: LEFT HEART CATH AND CORONARY ANGIOGRAPHY;  Surgeon: Corky CraftsVaranasi, Jayadeep S, MD;  [facility];  Service: Cardiovascular;  Laterality: N/A;    There were no vitals filed for this visit.  Subjective Assessment - 10/12/18 0917    Subjective  Patient reports he took his blood pressure medication early this morning. No pain just discomfort in his L arm.    Pertinent History  53 yo Male diagnosed with CVA on 07/04/18 with difficulty walking, weakness on left side and slurred speech. Patient reports on Tuesday 3/10 he was unable to walk at all. Currently he is able to ambulate short distances without any assistive device; He denies any new falls. Patient lives in an apartment with stairs inside. he reports that he has been trying to negotiate steps more but does get fatigued. He is able to negotiate steps with reciprocal pattern but does require increased time; He presents to therapy with increased BP but states that he just took his medicine about an hour  before therapy; Denies any symptoms including chest pain, shortness of breath or dizziness;     Limitations  Walking;Standing    How long can you sit comfortably?  NA    How long can you stand comfortably?  20 min; requires multiple rest breaks;     How long can you walk comfortably?  about 500 feet with rest breaks;     Diagnostic tests  MRI on 3/10 shows Multifocal acute ischemic infarcts of the right cerebellum    Patient Stated Goals  reduce numbness on left side; Improve strength and walking;     Currently in Pain?  No/denies     Vitals Monitored as followed: Initial start of session:   150/103 pulse (70)  On R arm   143/102 pulse 68 on L  arm    After exercises:   155/99   After 6 MWT 161/99 pulse 77   Patient left at beginning of session to go get daughter/caregiver due to feeling stressed that she was not with him. Session resumed upon return.    Treament:   Access Code: [email or web address]  URL: [email or web address]  Date: 10/12/2018  Prepared by: Precious BardMarina Vernetta Dizdarevic   Exercises Standing Hip Extension with Counter Support - 10 reps - 2 sets - 5 hold - 1x daily - 7x weekly Standing Hip Abduction with Counter Support - 10 reps - 2 sets - 5 hold - 1x daily - 7x weekly Seated Ankle Alphabet - 1 reps - 2 sets - 5 hold - 1x daily - 7x weekly   Seated adduction squeezes 10x 5 second holds  6 MWT: 992 ft  Noted increase foot drag of L foot and decreased weight acceptance/stance phase on LLE. Decreased hip extension bilaterally with fatigue.     Weight 187           PT Education - 10/12/18 0924    Education Details  exercise technique, HEP, BP    Person(s) Educated  Patient    Methods  Explanation;Demonstration;Tactile cues;Verbal cues;Handout    Comprehension  Verbalized understanding;Returned demonstration;Verbal cues required;Tactile cues required       PT Short Term Goals - 10/03/18 1144      PT SHORT TERM GOAL #1   Title  Patient will be adherent to HEP at least 3x a week to improve functional strength and balance for better safety at home.    Baseline  does not have a HEP    Time  4    Period  Weeks    Status  New    Target Date  10/31/18        PT Long Term Goals - 10/12/18 1013      PT LONG TERM GOAL #1   Title  Patient will increase 10 meter walk test to >1.2 m/s as to improve gait speed for better community ambulation and to reduce fall risk.    Baseline  10/03/18: 1.0 m/s    Time  8    Period  Weeks    Status  New    Target Date  11/28/18      PT LONG TERM GOAL #2   Title  Patient will increase six minute walk test distance to >1000 for progression to community ambulator and  improve gait ability    Baseline  6/18: 992    Time  8    Period  Weeks    Status  New    Target Date  11/28/18      PT LONG TERM GOAL #  3   Title  Patient will increase Functional Gait Assessment score to >20/30 as to reduce fall risk and improve dynamic gait safety with community ambulation.    Baseline  unable to complete at PT evaluation due to elevated BP    Time  8    Period  Weeks    Status  New    Target Date  11/28/18      PT LONG TERM GOAL #4   Title  Patient will increase BLE gross strength to 4+/5 as to improve functional strength for independent gait, increased standing tolerance and increased ADL ability.    Baseline  10/03/18: LLE hip grossly 3+ to 4-/5    Time  8    Period  Weeks    Status  New    Target Date  11/28/18      PT LONG TERM GOAL #5   Title  Patient will increase lower extremity functional scale to >60/80 to demonstrate improved functional mobility and increased tolerance with ADLs.     Baseline  10/03/18: 30/80    Time  8    Period  Weeks    Status  New    Target Date  11/28/18            Plan - 10/12/18 1013    Clinical Impression Statement  Patients session limited by elevated diastolic blood pressure as well as by patient leaving at beginning of session to go get daughter/caregiver to assist. Patient educated on need for more functional range of BP for activity, informing physician of elevated BP despite medication and compliance with physician orders. Patient's diastolic BP decreased to functional range allowing patient to perform 6 MWT for evaluatory purposes with noted degradation of gait mechanics with fatigue. Patient would benefit from skilled PT intervention to improve strength and mobility;    Personal Factors and Comorbidities  Comorbidity 1;Comorbidity 2;Education;Transportation;Comorbidity 3+    Comorbidities  HTN (not controlled), heart disease with recent MI in last year, recent stroke, current smoker    Examination-Activity  Limitations  Carry;Lift;Locomotion Level;Squat;Stairs;Stand;Transfers    Examination-Participation Restrictions  Cleaning;Community Activity;Driving;Laundry;Medication Management;Meal Prep    Stability/Clinical Decision Making  Evolving/Moderate complexity    Rehab Potential  Fair    PT Frequency  1x / week    PT Duration  8 weeks    PT Treatment/Interventions  Cryotherapy;Moist Heat;Gait training;Stair training;Functional mobility training;Therapeutic activities;Therapeutic exercise;Balance training;Neuromuscular re-education;Patient/family education;Energy conservation    PT Next Visit Plan  re assess HEP, dynamic stability pending BP    PT Home Exercise Plan  will address next visit    Consulted and Agree with Plan of Care  Patient       Patient will benefit from skilled therapeutic intervention in order to improve the following deficits and impairments:  Cardiopulmonary status limiting activity, Decreased activity tolerance, Decreased balance, Decreased endurance, Decreased knowledge of precautions, Decreased mobility, Decreased safety awareness, Decreased strength, Difficulty walking  Visit Diagnosis: 1. Muscle weakness (generalized)   2. Difficulty in walking, not elsewhere classified   3. Other lack of coordination        Problem List Patient Active Problem List   Diagnosis Date Noted  . Ataxia, post-stroke   . Gait disturbance, post-stroke   . Intractable hiccoughs   . 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 Stroke (cerebrum) ([place]) 07/04/2018  . Unstable angina ([place]) 01/26/2018  . NSTEMI (non-ST elevated myocardial infarction) ([place]) 11/28/2017  . 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 Chest pain 07/01/2016  . SOB (shortness  of breath) 12/10/2011  . CAD (coronary artery disease)   . Hyperlipidemia   . Hypertension   . Tobacco abuse   . Cocaine abuse (HCC)    Precious BardMarina Trevino Wyatt, PT, DPT   10/12/2018, 1:16 PM  [place] [facility] MAIN  [facility] [address] [place], [place], [address] Phone: [phone]   Fax:  [phone]  Name: Kenneth Morales MRN: [id number] Date of Birth: [birth year]

## 2018-10-14 NOTE — Therapy (Signed)
[place] [facility] [address] [place], [place], [address] Phone: [phone]   Fax:  [phone]  Occupational Therapy Treatment  Patient Details  Name: Kenneth Morales Spoerl MRN: [id number] Date of Birth: 30-May-1965 No data recorded  Encounter Date: 10/12/2018  OT End of Session - 10/14/18 1218    Visit Number  3    Number of Visits  24    Date for OT Re-Evaluation  12/14/18    Authorization Type  Medicaid    Authorization - Visit Number  2    Authorization - Number of Visits  3    OT Start Time  1030    OT Stop Time  1116    OT Time Calculation (min)  46 min    Activity Tolerance  Patient tolerated treatment well    Behavior During Therapy  [facility] for tasks assessed/performed       Past Medical History:  Diagnosis Date  . Acute MI (HCC) MAY 2013  . CAD (coronary artery disease)    NSTEMI in 05/13 in setting of Cocaine use. Cath: 99% mid LCX stenosis with a large thrombus. PCI and BMS (4.0 X15 mm Vision) placement to mid LCX, LAD: 20%, RCA: 30%, EF: 60%.   . Cocaine abuse (HCC)    quit in 08/2011  . Depression   . Hyperlipidemia   . Hypertension   . Spider bite   . Tobacco abuse     Past Surgical History:  Procedure Laterality Date  . CARDIAC CATHETERIZATION  MAY 2013   s/p stent @ [facility]  . CARDIAC CATHETERIZATION    . CORONARY CTO INTERVENTION N/A 01/27/2018   Procedure: CORONARY CTO INTERVENTION;  Surgeon: Corky CraftsVaranasi, Jayadeep S, MD;  [facility];  Service: Cardiovascular;  Laterality: N/A;  . CORONARY STENT INTERVENTION N/A 11/29/2017   Procedure: CORONARY STENT INTERVENTION;  Surgeon: Alwyn Peaallwood, Dwayne D, MD;  Location: [facility] INVASIVE CV LAB;  Service: Cardiovascular;  Laterality: N/A;  . CORONARY STENT INTERVENTION N/A 01/27/2018   Procedure: CORONARY STENT INTERVENTION;  Surgeon: Corky CraftsVaranasi, Jayadeep S, MD;  Location: [facility] INVASIVE CV LAB;  Service: Cardiovascular;  Laterality: N/A;  mid cfx  . HEMORRHOID  SURGERY    . LEFT HEART CATH AND CORONARY ANGIOGRAPHY Right 11/29/2017   Procedure: Left heart catheterization with possible PCI;  Surgeon: Laurier NancyKhan, Shaukat A, MD;  [facility];  Service: Cardiovascular;  Laterality: Right;  . LEFT HEART CATH AND CORONARY ANGIOGRAPHY N/A 01/27/2018   Procedure: LEFT HEART CATH AND CORONARY ANGIOGRAPHY;  Surgeon: Corky CraftsVaranasi, Jayadeep S, MD;  [facility];  Service: Cardiovascular;  Laterality: N/A;    There were no vitals filed for this visit.  Subjective Assessment - 10/14/18 1217    Subjective   Patient reports his blood pressure has not been reall consistent in the last 2 weeks.  He has a blood pressure monitor at home but does not feel its very acccurate.    Pertinent History  Pt reports he was sleeping and when he woke he couldn't stand, came to ER but was sent home.  Came back the next day, was [facility]  He was in rehab for 3 weeks and then discharged home.  Patient now presents for OP OT evaluation.      Patient Stated Goals  Patient reports he wants to get back to doing everything he did before.     Currently in Pain?  No/denies  Multiple Pain Sites  No           Therapeutic Exercises: Patient seen this date for strengthening with use of upper body ergometer, alternating levels of  resistance 4.0 to 4.3 as well as alternating directions, forwards and backwards.  Therapist in constant attendance to adjust settings and to ensure grip. Shape tower with 2# wrist weights to complete all 4 levels of reach from seated position.  Sustained Grip strengthening with 17# for 25 reps for one set, 23# for one set and then 28# for one set. Cues for proper form and grasp on hand gripper.   Neuromuscular Reeducation:  Patient seen for manipulation of Small pegs to pick up, use of translatory skills of the hand to move to palm, using the hand for storage and moving items back to finger tips.   Cues provided by therapist as  well as therapist demo.  Manipulation of Grooved pegs with the use of left hand, cues to turn pegs to place into grid in order.   Response to tx:   Patient has continued to make good progress with strength, grip strength has improved 5# overall since evaluation.  Coordination improving with decreased dropping of items, manipulation of small objects  inch in size more consistently and progressing to more complex hand skills with translatory movements of the hand and using the hand for storage.  Continue to work towards goals to increase independence in necessary daily tasks.                   OT Education - 10/14/18 1217    Education Details  UE strength and coordination tasks that can be performed at home, reaching tasks, small items to pick up and work on using hand for storage.    Person(s) Educated  Patient    Methods  Explanation;Demonstration    Comprehension  Verbalized understanding;Returned demonstration          OT Long Term Goals - 10/14/18 1219      OT LONG TERM GOAL #1   Title  Patient will improve left grip strength by 10# to open jars and containers with modified independence.      Baseline  decreased grip on left    Time  12    Period  Weeks    Status  On-going    Target Date  12/14/18      OT LONG TERM GOAL #2   Title  Patient will complete light housekeeping tasks with modified independence and good safety awareness    Baseline  daughter helping with home management tasks at eval, patient is not currently performing    Time  12    Period  Weeks    Status  On-going    Target Date  12/14/18      OT LONG TERM GOAL #3   Title  Patient will complete meal preparation with modified independence.     Baseline  requires moderate assist at eval    Time  12    Period  Weeks    Status  On-going    Target Date  12/14/18      OT LONG TERM GOAL #4   Title  Patient will be able to carry 1 gallon of milk from grocery store to the car with modified  independence.      Baseline  able to carry light bag but unable to carry gallon of milk.     Time  8    Period  Weeks  Status  On-going    Target Date  12/14/18      OT LONG TERM GOAL #5   Title  Patient will demonstrate compensatory strategies for safety with diminished sensation in left UE to complete ADL tasks safely.     Baseline  diminished hot/cold and light touch on left UE.    Time  8    Period  Weeks    Status  On-going    Target Date  12/14/18            Plan - 10/14/18 1219    Clinical Impression Statement  Patient has continued to make good progress with strength, grip strength has improved 5# overall since evaluation.  Coordination improving with decreased dropping of items, manipulation of small objects  inch in size more consistently and progressing to more complex hand skills with translatory movements of the hand and using the hand for storage.  Continue to work towards goals to increase independence in necessary daily tasks.    OT Occupational Profile and History  Detailed Assessment- Review of Records and additional review of physical, cognitive, psychosocial history related to current functional performance    Occupational performance deficits (Please refer to evaluation for details):  ADL's;IADL's;Rest and Sleep;Social Participation    Body Structure / Function / Physical Skills  ADL;Coordination;Endurance;GMC;UE functional use;Balance;Sensation;IADL;Vision;FMC;Strength;Dexterity;ROM    Psychosocial Skills  Environmental  Adaptations;Habits;Routines and Behaviors    Rehab Potential  Good    Comorbidities Affecting Occupational Performance:  May have comorbidities impacting occupational performance    Modification or Assistance to Complete Evaluation   Min-Moderate modification of tasks or assist with assess necessary to complete eval    OT Frequency  2x / week    OT Duration  12 weeks    OT Treatment/Interventions  Self-care/ADL training;Therapeutic  exercise;Visual/perceptual remediation/compensation;Moist Heat;Neuromuscular education;Patient/family education;Building services engineerunctional Mobility Training;Therapeutic activities;Balance training;Contrast Bath;DME and/or AE instruction    Consulted and Agree with Plan of Care  Patient       Patient will benefit from skilled therapeutic intervention in order to improve the following deficits and impairments:   Body Structure / Function / Physical Skills: ADL, Coordination, Endurance, GMC, UE functional use, Balance, Sensation, IADL, Vision, FMC, Strength, Dexterity, ROM   Psychosocial Skills: Environmental  Adaptations, Habits, Routines and Behaviors   Visit Diagnosis: 1. Muscle weakness (generalized)   2. Other lack of coordination       Problem List Patient Active Problem List   Diagnosis Date Noted  . Ataxia, post-stroke   . Gait disturbance, post-stroke   . Intractable hiccoughs   . 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 Stroke (cerebrum) (HCC) 07/04/2018  . Unstable angina (HCC) 01/26/2018  . NSTEMI (non-ST elevated myocardial infarction) (HCC) 11/28/2017  . 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 Chest pain 07/01/2016  . SOB (shortness of breath) 12/10/2011  . CAD (coronary artery disease)   . Hyperlipidemia   . Hypertension   . Tobacco abuse   . Cocaine abuse [facility](HCC)    Amy Cornelius Moras Lovett, OTR/L, CLT Lovett,Amy 10/14/2018, 12:22 PM  [place] [facility] MAIN [facility] [address] [place], [place], [address] Phone: [phone]   Fax:  [phone]  Name: Kenneth Morales Tersigni MRN: [id number] Date of Birth: [birth year]

## 2018-10-19 ENCOUNTER — Ambulatory Visit: Payer: Medicaid Other

## 2018-10-19 ENCOUNTER — Other Ambulatory Visit: Payer: Self-pay

## 2018-10-19 DIAGNOSIS — R262 Difficulty in walking, not elsewhere classified: Secondary | ICD-10-CM

## 2018-10-19 DIAGNOSIS — M6281 Muscle weakness (generalized): Secondary | ICD-10-CM | POA: Diagnosis not present

## 2018-10-19 DIAGNOSIS — R278 Other lack of coordination: Secondary | ICD-10-CM

## 2018-10-19 NOTE — Therapy (Signed)
[facility] [address] [place], [place], [address] Phone: [phone]   Fax:  [phone]  Physical Therapy Treatment  Patient Details  Name: Zoila Shutterimothy E Fullard MRN: [id number] Date of Birth: 1965/11/15 Referring Provider (PT): Dr. Malvin JohnsPotter   Encounter Date: 10/19/2018  PT End of Session - 10/19/18 1010    Visit Number  3    Number of Visits  9    Date for PT Re-Evaluation  11/28/18    Authorization Type  medicaid    PT Start Time  0915    PT Stop Time  0959    PT Time Calculation (min)  44 min    Equipment Utilized During Treatment  Gait belt    Activity Tolerance  Other (comment);Patient tolerated treatment well;Patient limited by fatigue   BP/vitals   Behavior During Therapy  [facility] for tasks assessed/performed       Past Medical History:  Diagnosis Date  . Acute MI (HCC) MAY 2013  . CAD (coronary artery disease)    NSTEMI in 05/13 in setting of Cocaine use. Cath: 99% mid LCX stenosis with a large thrombus. PCI and BMS (4.0 X15 mm Vision) placement to mid LCX, LAD: 20%, RCA: 30%, EF: 60%.   . Cocaine abuse (HCC)    quit in 08/2011  . Depression   . Hyperlipidemia   . Hypertension   . Spider bite   . Tobacco abuse     Past Surgical History:  Procedure Laterality Date  . CARDIAC CATHETERIZATION  MAY 2013   s/p stent @ [facility]  . CARDIAC CATHETERIZATION    . CORONARY CTO INTERVENTION N/A 01/27/2018   Procedure: CORONARY CTO INTERVENTION;  Surgeon: Corky CraftsVaranasi, Jayadeep S, MD;  [facility];  Service: Cardiovascular;  Laterality: N/A;  . CORONARY STENT INTERVENTION N/A 11/29/2017   Procedure: CORONARY STENT INTERVENTION;  Surgeon: Alwyn Peaallwood, Dwayne D, MD;  Location: [facility] INVASIVE CV LAB;  Service: Cardiovascular;  Laterality: N/A;  . CORONARY STENT INTERVENTION N/A 01/27/2018   Procedure: CORONARY STENT INTERVENTION;  Surgeon: Corky CraftsVaranasi, Jayadeep S, MD;  [facility] LAB;  Service: Cardiovascular;   Laterality: N/A;  mid cfx  . HEMORRHOID SURGERY    . LEFT HEART CATH AND CORONARY ANGIOGRAPHY Right 11/29/2017   Procedure: Left heart catheterization with possible PCI;  Surgeon: Laurier NancyKhan, Shaukat A, MD;  [facility];  Service: Cardiovascular;  Laterality: Right;  . LEFT HEART CATH AND CORONARY ANGIOGRAPHY N/A 01/27/2018   Procedure: LEFT HEART CATH AND CORONARY ANGIOGRAPHY;  Surgeon: Corky CraftsVaranasi, Jayadeep S, MD;  [facility];  Service: Cardiovascular;  Laterality: N/A;    There were no vitals filed for this visit.  Subjective Assessment - 10/19/18 1009    Subjective  Patient reports he took his blood pressure medication an hour and half prior to PT. Took additional medication/other medication (patient did not say what he was taking) when initial pressure was high. Reports his doctor told him to take this medication so he can workout.    Pertinent History  53 yo Male diagnosed with CVA on 07/04/18 with difficulty walking, weakness on left side and slurred speech. Patient reports on Tuesday 3/10 he was unable to walk at all. Currently he is able to ambulate short distances without any assistive device; He denies any new falls. Patient lives in an apartment with stairs inside. he reports that he has been trying to negotiate steps more but does get fatigued. He is  able to negotiate steps with reciprocal pattern but does require increased time; He presents to therapy with increased BP but states that he just took his medicine about an hour before therapy; Denies any symptoms including chest pain, shortness of breath or dizziness;     Limitations  Walking;Standing    How long can you sit comfortably?  NA    How long can you stand comfortably?  20 min; requires multiple rest breaks;     How long can you walk comfortably?  about 500 feet with rest breaks;     Diagnostic tests  MRI on 3/10 shows Multifocal acute ischemic infarcts of the right cerebellum    Patient Stated Goals   reduce numbness on left side; Improve strength and walking;     Currently in Pain?  No/denies      BP at start of session: 144/109 pulse 78  After medication  At beginning of session: 123/83 pulse 82  Treatment TherEx Sit to stands 10x Leg press:Matrix machine: BLE: #165 cueing for sequencing, decreasing velocity of movement for increased muscle contraction Single LE : 75 lb 10x 2 sets ; assistance for positioning of LE and velocity for maximal muscle contraction, cueing for exhale during concentric portion and inhale during eccentric  Single leg sit to stand LLE from raised table, hold onto PT hand   GTB: adduction against PT resistance 12x  GTB: pf against PT resistance 12x   Neuro Re-ed Hallway ambulation with head turns horizontal reading cards 2x 86 ft Ball toss: vertical toss, bounce 86 ft each attempt with CGA, one episode of scissoring requiring redirection, cueing to follow ball with head Opposite LE/UE raises for reciprocating motion with decreasing intervals of 5,4,3,2,1     BP after exercise: 133/89 pulse 88  Patient requires frequent rest breaks and monitoring of vitals due to medical history. Patient additionally requires cueing for sequencing for optimal muscle recruitment.   Patient arrived initially with higher blood pressure requiring him to take his medication prior to any intervention attempt. BP remeasured after medication and found to be within functional range. Patient requires frequent rest breaks and monitoring of vitals due to medical history. Patient additionally requires cueing for sequencing for optimal performance of interventions as well as to decrease velocity of movement and increase breathing due to holding breath. Patient would benefit from skilled PT intervention to improve strength and mobility.                    PT Education - 10/19/18 1010    Education Details  exercise technique, stability, monitoring vitals    Person(s)  Educated  Patient    Methods  Explanation;Demonstration;Tactile cues;Verbal cues    Comprehension  Verbalized understanding;Returned demonstration;Verbal cues required;Tactile cues required       PT Short Term Goals - 10/03/18 1144      PT SHORT TERM GOAL #1   Title  Patient will be adherent to HEP at least 3x a week to improve functional strength and balance for better safety at home.    Baseline  does not have a HEP    Time  4    Period  Weeks    Status  New    Target Date  10/31/18        PT Long Term Goals - 10/12/18 1013      PT LONG TERM GOAL #1   Title  Patient will increase 10 meter walk test to >1.2 m/s as to improve gait speed  for better community ambulation and to reduce fall risk.    Baseline  10/03/18: 1.0 m/s    Time  8    Period  Weeks    Status  New    Target Date  11/28/18      PT LONG TERM GOAL #2   Title  Patient will increase six minute walk test distance to >1000 for progression to community ambulator and improve gait ability    Baseline  6/18: 992    Time  8    Period  Weeks    Status  New    Target Date  11/28/18      PT LONG TERM GOAL #3   Title  Patient will increase Functional Gait Assessment score to >20/30 as to reduce fall risk and improve dynamic gait safety with community ambulation.    Baseline  unable to complete at PT evaluation due to elevated BP    Time  8    Period  Weeks    Status  New    Target Date  11/28/18      PT LONG TERM GOAL #4   Title  Patient will increase BLE gross strength to 4+/5 as to improve functional strength for independent gait, increased standing tolerance and increased ADL ability.    Baseline  10/03/18: LLE hip grossly 3+ to 4-/5    Time  8    Period  Weeks    Status  New    Target Date  11/28/18      PT LONG TERM GOAL #5   Title  Patient will increase lower extremity functional scale to >60/80 to demonstrate improved functional mobility and increased tolerance with ADLs.     Baseline  10/03/18: 30/80     Time  8    Period  Weeks    Status  New    Target Date  11/28/18            Plan - 10/19/18 1011    Clinical Impression Statement  Patient arrived initially with higher blood pressure requiring him to take his medication prior to any intervention attempt. BP remeasured after medication and found to be within functional range. Patient requires frequent rest breaks and monitoring of vitals due to medical history. Patient additionally requires cueing for sequencing for optimal performance of interventions as well as to decrease velocity of movement and increase breathing due to holding breath. Patient would benefit from skilled PT intervention to improve strength and mobility.    Personal Factors and Comorbidities  Comorbidity 1;Comorbidity 2;Education;Transportation;Comorbidity 3+    Comorbidities  HTN (not controlled), heart disease with recent MI in last year, recent stroke, current smoker    Examination-Activity Limitations  Carry;Lift;Locomotion Level;Squat;Stairs;Stand;Transfers    Examination-Participation Restrictions  Cleaning;Community Activity;Driving;Laundry;Medication Management;Meal Prep    Stability/Clinical Decision Making  Evolving/Moderate complexity    Rehab Potential  Fair    PT Frequency  1x / week    PT Duration  8 weeks    PT Treatment/Interventions  Cryotherapy;Moist Heat;Gait training;Stair training;Functional mobility training;Therapeutic activities;Therapeutic exercise;Balance training;Neuromuscular re-education;Patient/family education;Energy conservation    PT Next Visit Plan  strengthening, dynamic stability    PT Home Exercise Plan  will address next visit    Consulted and Agree with Plan of Care  Patient       Patient will benefit from skilled therapeutic intervention in order to improve the following deficits and impairments:  Cardiopulmonary status limiting activity, Decreased activity tolerance, Decreased balance, Decreased endurance, Decreased knowledge  of  precautions, Decreased mobility, Decreased safety awareness, Decreased strength, Difficulty walking  Visit Diagnosis: 1. Muscle weakness (generalized)   2. Other lack of coordination   3. Difficulty in walking, not elsewhere classified        Problem List Patient Active Problem List   Diagnosis Date Noted  . Ataxia, post-stroke   . Gait disturbance, post-stroke   . Intractable hiccoughs   . 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 Stroke (cerebrum) ([place]) 07/04/2018  . Unstable angina ([place]) 01/26/2018  . NSTEMI (non-ST elevated myocardial infarction) ([place]) 11/28/2017  . 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 Chest pain 07/01/2016  . SOB (shortness of breath) 12/10/2011  . CAD (coronary artery disease)   . Hyperlipidemia   . Hypertension   . Tobacco abuse   . Cocaine abuse [facility])   Janna Arch, PT, DPT   10/19/2018, 10:13 AM  [place] MAIN [facility] SERVICES [address] [place], [place], [address] Phone: [phone]   Fax:  [phone]  Name: SOHAIB VEREEN MRN: [id number] Date of Birth: [birth year]

## 2018-10-26 ENCOUNTER — Ambulatory Visit: Payer: Medicaid Other | Attending: Neurology

## 2018-11-02 ENCOUNTER — Ambulatory Visit: Payer: Medicaid Other | Admitting: Occupational Therapy

## 2018-11-02 ENCOUNTER — Ambulatory Visit: Payer: Medicaid Other

## 2018-11-09 ENCOUNTER — Ambulatory Visit: Payer: Medicaid Other

## 2018-11-13 ENCOUNTER — Encounter: Payer: Self-pay | Admitting: Emergency Medicine

## 2018-11-13 ENCOUNTER — Inpatient Hospital Stay
Admission: EM | Admit: 2018-11-13 | Discharge: 2018-11-15 | DRG: 282 | Disposition: A | Discharge status: Home / Self Care | Payer: Medicaid Other | Attending: Internal Medicine | Admitting: Internal Medicine

## 2018-11-13 ENCOUNTER — Other Ambulatory Visit: Payer: Self-pay

## 2018-11-13 ENCOUNTER — Inpatient Hospital Stay
Admit: 2018-11-13 | Discharge: 2018-11-13 | Disposition: A | Discharge status: Home / Self Care | Payer: Medicaid Other | Attending: Internal Medicine | Admitting: Internal Medicine

## 2018-11-13 ENCOUNTER — Emergency Department: Payer: Medicaid Other

## 2018-11-13 DIAGNOSIS — Z955 Presence of coronary angioplasty implant and graft: Secondary | ICD-10-CM | POA: Diagnosis not present

## 2018-11-13 DIAGNOSIS — Z1159 Encounter for screening for other viral diseases: Secondary | ICD-10-CM | POA: Diagnosis not present

## 2018-11-13 DIAGNOSIS — I252 Old myocardial infarction: Secondary | ICD-10-CM | POA: Diagnosis not present

## 2018-11-13 DIAGNOSIS — Z7902 Long term (current) use of antithrombotics/antiplatelets: Secondary | ICD-10-CM

## 2018-11-13 DIAGNOSIS — R079 Chest pain, unspecified: Secondary | ICD-10-CM | POA: Diagnosis present

## 2018-11-13 DIAGNOSIS — I214 Non-ST elevation (NSTEMI) myocardial infarction: Secondary | ICD-10-CM | POA: Diagnosis present

## 2018-11-13 DIAGNOSIS — Z8249 Family history of ischemic heart disease and other diseases of the circulatory system: Secondary | ICD-10-CM | POA: Diagnosis not present

## 2018-11-13 DIAGNOSIS — Z888 Allergy status to other drugs, medicaments and biological substances status: Secondary | ICD-10-CM | POA: Diagnosis not present

## 2018-11-13 DIAGNOSIS — E876 Hypokalemia: Secondary | ICD-10-CM | POA: Diagnosis present

## 2018-11-13 DIAGNOSIS — F17210 Nicotine dependence, cigarettes, uncomplicated: Secondary | ICD-10-CM | POA: Diagnosis present

## 2018-11-13 DIAGNOSIS — F329 Major depressive disorder, single episode, unspecified: Secondary | ICD-10-CM | POA: Diagnosis present

## 2018-11-13 DIAGNOSIS — Z79899 Other long term (current) drug therapy: Secondary | ICD-10-CM | POA: Diagnosis not present

## 2018-11-13 DIAGNOSIS — I10 Essential (primary) hypertension: Secondary | ICD-10-CM | POA: Diagnosis present

## 2018-11-13 DIAGNOSIS — Z880 Allergy status to penicillin: Secondary | ICD-10-CM | POA: Diagnosis not present

## 2018-11-13 DIAGNOSIS — I6930 Unspecified sequelae of cerebral infarction: Secondary | ICD-10-CM | POA: Diagnosis not present

## 2018-11-13 DIAGNOSIS — E785 Hyperlipidemia, unspecified: Secondary | ICD-10-CM | POA: Diagnosis present

## 2018-11-13 DIAGNOSIS — F1410 Cocaine abuse, uncomplicated: Secondary | ICD-10-CM | POA: Diagnosis present

## 2018-11-13 DIAGNOSIS — Z7982 Long term (current) use of aspirin: Secondary | ICD-10-CM | POA: Diagnosis not present

## 2018-11-13 DIAGNOSIS — I25110 Atherosclerotic heart disease of native coronary artery with unstable angina pectoris: Secondary | ICD-10-CM | POA: Diagnosis present

## 2018-11-13 LAB — BASIC METABOLIC PANEL
Anion gap: 9 (ref 5–15)
BUN: 14 mg/dL (ref 6–20)
CO2: 23 mmol/L (ref 22–32)
Calcium: 9.3 mg/dL (ref 8.9–10.3)
Chloride: 108 mmol/L (ref 98–111)
Creatinine, Ser: 1.03 mg/dL (ref 0.61–1.24)
GFR calc Af Amer: >60 mL/min (ref >60)
GFR calc non Af Amer: >60 mL/min (ref >60)
Glucose, Bld: 157 mg/dL — ABNORMAL HIGH (ref 70–99)
Potassium: 3.4 mmol/L — ABNORMAL LOW (ref 3.5–5.1)
Sodium: 140 mmol/L (ref 135–145)

## 2018-11-13 LAB — CBC
HCT: 43.5 % (ref 39.0–52.0)
Hemoglobin: 14.9 g/dL (ref 13.0–17.0)
MCH: 32 pg (ref 26.0–34.0)
MCHC: 34.3 g/dL (ref 30.0–36.0)
MCV: 93.3 fL (ref 80.0–100.0)
Platelets: 318 10*3/uL (ref 150–400)
RBC: 4.66 MIL/uL (ref 4.22–5.81)
RDW: 13 % (ref 11.5–15.5)
WBC: 8.2 10*3/uL (ref 4.0–10.5)
nRBC: 0 % (ref 0.0–0.2)

## 2018-11-13 LAB — LDL CHOLESTEROL, DIRECT: Direct LDL: 122.8 mg/dL — ABNORMAL HIGH (ref 0–99)

## 2018-11-13 LAB — LIPID PANEL
Cholesterol: 216 mg/dL — ABNORMAL HIGH (ref 0–200)
HDL: 29 mg/dL — ABNORMAL LOW (ref >40)
LDL Cholesterol: UNDETERMINED mg/dL (ref 0–99)
Total CHOL/HDL Ratio: 7.4 RATIO
Triglycerides: 662 mg/dL — ABNORMAL HIGH (ref <150)
VLDL: UNDETERMINED mg/dL (ref 0–40)

## 2018-11-13 LAB — HEMOGLOBIN A1C
Hgb A1c MFr Bld: 5.7 % — ABNORMAL HIGH (ref 4.8–5.6)
Mean Plasma Glucose: 116.89 mg/dL

## 2018-11-13 LAB — PROTIME-INR
INR: 0.9 (ref 0.8–1.2)
Prothrombin Time: 12 seconds (ref 11.4–15.2)

## 2018-11-13 LAB — TROPONIN I (HIGH SENSITIVITY)
Troponin I (High Sensitivity): 18 ng/L — ABNORMAL HIGH (ref <18)
Troponin I (High Sensitivity): 49 ng/L — ABNORMAL HIGH (ref <18)
Troponin I (High Sensitivity): 651 ng/L (ref <18)
Troponin I (High Sensitivity): 1486 ng/L (ref <18)

## 2018-11-13 LAB — APTT: aPTT: 31 seconds (ref 24–36)

## 2018-11-13 LAB — SARS CORONAVIRUS 2 BY RT PCR (HOSPITAL ORDER, PERFORMED IN ~~LOC~~ HOSPITAL LAB): SARS Coronavirus 2: NEGATIVE

## 2018-11-13 MED ORDER — RANOLAZINE ER 500 MG PO TB12
500.0000 mg | ORAL_TABLET | Freq: Two times a day (BID) | ORAL | Status: DC
  Administered 2018-11-13 – 2018-11-15 (×4): 500 mg via ORAL
  Filled 2018-11-13 (×5): qty 1

## 2018-11-13 MED ORDER — ALUM & MAG HYDROXIDE-SIMETH 200-200-20 MG/5ML PO SUSP: 30.0000 mL | Freq: Four times a day (QID) | ORAL | Status: DC | PRN

## 2018-11-13 MED ORDER — MORPHINE SULFATE (PF) 2 MG/ML IV SOLN
2.0000 mg | Freq: Once | INTRAVENOUS | Status: AC
  Administered 2018-11-13: 2 mg via INTRAVENOUS
  Filled 2018-11-13: qty 1

## 2018-11-13 MED ORDER — ASPIRIN EC 81 MG PO TBEC
81.0000 mg | DELAYED_RELEASE_TABLET | Freq: Every day | ORAL | Status: DC
  Administered 2018-11-13 – 2018-11-14 (×2): 81 mg via ORAL
  Filled 2018-11-13 (×2): qty 1

## 2018-11-13 MED ORDER — CLOPIDOGREL BISULFATE 75 MG PO TABS
75.0000 mg | ORAL_TABLET | Freq: Every day | ORAL | Status: DC
  Administered 2018-11-13 – 2018-11-15 (×3): 75 mg via ORAL
  Filled 2018-11-13 (×3): qty 1

## 2018-11-13 MED ORDER — FAMOTIDINE 20 MG PO TABS
20.0000 mg | ORAL_TABLET | Freq: Two times a day (BID) | ORAL | Status: DC
  Administered 2018-11-13 – 2018-11-15 (×4): 20 mg via ORAL
  Filled 2018-11-13 (×4): qty 1

## 2018-11-13 MED ORDER — ACETAMINOPHEN 325 MG PO TABS
650.0000 mg | ORAL_TABLET | ORAL | Status: DC | PRN
  Administered 2018-11-14: 650 mg via ORAL
  Filled 2018-11-13: qty 2

## 2018-11-13 MED ORDER — NORTRIPTYLINE HCL 10 MG PO CAPS
10.0000 mg | ORAL_CAPSULE | Freq: Every day | ORAL | Status: DC
  Administered 2018-11-13 – 2018-11-14 (×2): 10 mg via ORAL
  Filled 2018-11-13 (×3): qty 1

## 2018-11-13 MED ORDER — NITROGLYCERIN 0.4 MG SL SUBL: 0.4000 mg | SUBLINGUAL_TABLET | SUBLINGUAL | Status: DC | PRN

## 2018-11-13 MED ORDER — ONDANSETRON HCL 4 MG/2ML IJ SOLN
4.0000 mg | Freq: Once | INTRAMUSCULAR | Status: AC
  Administered 2018-11-13: 4 mg via INTRAVENOUS
  Filled 2018-11-13: qty 2

## 2018-11-13 MED ORDER — LIDOCAINE VISCOUS HCL 2 % MT SOLN
15.0000 mL | Freq: Four times a day (QID) | OROMUCOSAL | Status: DC | PRN
  Filled 2018-11-13: qty 15

## 2018-11-13 MED ORDER — HEPARIN BOLUS VIA INFUSION
4000.0000 [IU] | Freq: Once | INTRAVENOUS | Status: AC
  Administered 2018-11-13: 4000 [IU] via INTRAVENOUS
  Filled 2018-11-13: qty 4000

## 2018-11-13 MED ORDER — HEPARIN (PORCINE) 25000 UT/250ML-% IV SOLN
1000.0000 [IU]/h | INTRAVENOUS | Status: DC
  Administered 2018-11-13: 1000 [IU]/h via INTRAVENOUS
  Filled 2018-11-13: qty 250

## 2018-11-13 MED ORDER — SODIUM CHLORIDE 0.9% FLUSH
3.0000 mL | Freq: Once | INTRAVENOUS | Status: AC
  Administered 2018-11-14: 3 mL via INTRAVENOUS

## 2018-11-13 MED ORDER — ENOXAPARIN SODIUM 100 MG/ML ~~LOC~~ SOLN
1.0000 mg/kg | Freq: Two times a day (BID) | SUBCUTANEOUS | Status: DC
  Administered 2018-11-14 (×3): 85 mg via SUBCUTANEOUS
  Filled 2018-11-13 (×4): qty 1

## 2018-11-13 MED ORDER — ATORVASTATIN CALCIUM 80 MG PO TABS
80.0000 mg | ORAL_TABLET | Freq: Every day | ORAL | Status: DC
  Administered 2018-11-13 – 2018-11-15 (×3): 80 mg via ORAL
  Filled 2018-11-13 (×3): qty 1

## 2018-11-13 MED ORDER — HYDRALAZINE HCL 20 MG/ML IJ SOLN
5.0000 mg | INTRAMUSCULAR | Status: DC | PRN
  Administered 2018-11-13 – 2018-11-14 (×3): 5 mg via INTRAVENOUS
  Filled 2018-11-13 (×3): qty 1

## 2018-11-13 MED ORDER — POTASSIUM CHLORIDE CRYS ER 20 MEQ PO TBCR
40.0000 meq | EXTENDED_RELEASE_TABLET | Freq: Once | ORAL | Status: AC
  Administered 2018-11-13: 40 meq via ORAL
  Filled 2018-11-13: qty 2

## 2018-11-13 MED ORDER — AMLODIPINE BESYLATE 5 MG PO TABS
5.0000 mg | ORAL_TABLET | Freq: Every day | ORAL | Status: DC
  Administered 2018-11-13 – 2018-11-15 (×3): 5 mg via ORAL
  Filled 2018-11-13 (×3): qty 1

## 2018-11-13 MED ORDER — ONDANSETRON HCL 4 MG/2ML IJ SOLN: 4.0000 mg | Freq: Four times a day (QID) | INTRAMUSCULAR | Status: DC | PRN

## 2018-11-13 NOTE — ED Notes (Signed)
Floor nurse will call back when she finishes with what she is doing.

## 2018-11-13 NOTE — Progress Notes (Signed)
CRITICAL VALUE ALERT  Critical Value:  651  Date & Time Notied:  11/13/2018 7:34 PM  Provider Notified: [place]  Orders Received/Actions taken: awaiting response, remains on hep gtt, no active CP, report given to oncoming RN.

## 2018-11-13 NOTE — ED Provider Notes (Signed)
[facility] Emergency Department Provider Note   ____________________________________________    I have reviewed the triage vital signs and the nursing notes.   HISTORY  Chief Complaint Chest Pain     HPI Kenneth Morales is a 53 y.o. male with known coronary artery disease, hypertension who presents with complaints of chest pressure.  Patient reports moderate to severe chest pressure developed at 11 AM while he was sitting on his couch.  No exertion.  He took a nitroglycerin which did not help significantly.  Denies fevers or chills or cough or shortness of breath.  Review of medical records demonstrates catheterization 01/2018 with stent and angioplasty during that procedure.  Past Medical History:  Diagnosis Date  . Acute MI (HCC) MAY 2013  . CAD (coronary artery disease)    NSTEMI in 05/13 in setting of Cocaine use. Cath: 99% mid LCX stenosis with a large thrombus. PCI and BMS (4.0 X15 mm Vision) placement to mid LCX, LAD: 20%, RCA: 30%, EF: 60%.   . Cocaine abuse (HCC)    quit in 08/2011  . Depression   . Hyperlipidemia   . Hypertension   . Spider bite   . Tobacco abuse     Patient Active Problem List   Diagnosis Date Noted  . Ataxia, post-stroke   . Gait disturbance, post-stroke   . Intractable hiccoughs   . 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 Stroke (cerebrum) (HCC) 07/04/2018  . Unstable angina (HCC) 01/26/2018  . NSTEMI (non-ST elevated myocardial infarction) (HCC) 11/28/2017  . 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 Chest pain 07/01/2016  . SOB (shortness of breath) 12/10/2011  . CAD (coronary artery disease)   . Hyperlipidemia   . Hypertension   . Tobacco abuse   . Cocaine abuse [facility](HCC)     Past Surgical History:  Procedure Laterality Date  . CARDIAC CATHETERIZATION  MAY 2013   s/p stent @ [facility]  . CARDIAC CATHETERIZATION    . CORONARY CTO INTERVENTION N/A 01/27/2018   Procedure: CORONARY CTO INTERVENTION;   Surgeon: Corky CraftsVaranasi, Jayadeep S, MD;  [facility];  Service: Cardiovascular;  Laterality: N/A;  . CORONARY STENT INTERVENTION N/A 11/29/2017   Procedure: CORONARY STENT INTERVENTION;  Surgeon: Alwyn Peaallwood, Dwayne D, MD;  Location: [facility] INVASIVE CV LAB;  Service: Cardiovascular;  Laterality: N/A;  . CORONARY STENT INTERVENTION N/A 01/27/2018   Procedure: CORONARY STENT INTERVENTION;  Surgeon: Corky CraftsVaranasi, Jayadeep S, MD;  [facility]: [facility] INV[facility] CV LAB;  Service: Cardiovascular;  Laterality: N/A;  mid cfx  . HEMORRHOID SURGERY    . LEFT HEART CATH AND CORONARY ANGIOGRAPHY Right 11/29/2017   Procedure: Left heart catheterization with possible PCI;  Surgeon: Laurier NancyKhan, Shaukat A, MD;  [facility];  Service: Cardiovascular;  Laterality: Right;  . LEFT HEART CATH AND CORONARY ANGIOGRAPHY N/A 01/27/2018   Procedure: LEFT HEART CATH AND CORONARY ANGIOGRAPHY;  Surgeon: Corky CraftsVaranasi, Jayadeep S, MD;  [facility];  Service: Cardiovascular;  Laterality: N/A;    Prior to Admission medications   Medication Sig Start Date End Date Taking? Authorizing Provider  acetaminophen (TYLENOL) 325 MG tablet Take 2 tablets (650 mg total) by mouth every 4 (four) hours as needed for mild pain (or temp > 37.5 C (99.5 F)). 07/14/18   Angiulli, Mcarthur Rossettianiel J, PA-C  amLODipine (NORVASC) 5 MG tablet Take 1 tablet (5 mg total) by mouth daily. 07/14/18   Angiulli, Mcarthur Rossettianiel J, PA-C  aspirin 81 MG tablet Take 1 tablet (  81 mg total) by mouth daily. 07/18/18   Jones Baleshomas, Eunice L, NP  atorvastatin (LIPITOR) 80 MG tablet Take 1 tablet (80 mg total) by mouth daily at 6 PM. 07/14/18   Angiulli, Mcarthur Rossettianiel J, PA-C  clopidogrel (PLAVIX) 75 MG tablet Take 1 tablet (75 mg total) by mouth daily. 07/14/18   Angiulli, Mcarthur Rossettianiel J, PA-C  famotidine (PEPCID) 20 MG tablet Take 1 tablet (20 mg total) by mouth 2 (two) times daily. 07/18/18   Jones Baleshomas, Eunice L, NP  nitroGLYCERIN (NITROSTAT) 0.4 MG SL tablet Place 1 tablet (0.4mg  total) under the  tongue every 5 minutes as needed for chest pain. May take 3 doses. 07/18/18   Jones Baleshomas, Eunice L, NP  nortriptyline (PAMELOR) 10 MG capsule Take 10 mg by mouth at bedtime.    [provider]  polyethylene glycol (MIRALAX / GLYCOLAX) packet Take 17 g by mouth daily. 07/08/18   Enid Baasjie, Jude, MD  ranolazine (RANEXA) 500 MG 12 hr tablet Take 1 tablet (500 mg total) by mouth 2 (two) times daily. 07/14/18   Angiulli, Mcarthur Rossettianiel J, PA-C     Allergies Brilinta [ticagrelor] and Penicillins  Family History  Problem Relation Age of Onset  . Heart attack Maternal Grandfather   . Heart attack Paternal Grandfather     Social History Social History   Tobacco Use  . Smoking status: Current Every Day Smoker    Packs/day: 0.50    Years: 30.00    Pack years: 15.00    Types: Cigarettes  . Smokeless tobacco: Never Used  Substance Use Topics  . Alcohol use: Yes    Alcohol/week: 6.0 standard drinks    Types: 6 Cans of beer per week    Comment: weekly  . Drug use: No    Types: Cocaine    Comment: used cocaine 15-20 years    Review of Systems  Constitutional: No fever/chills Eyes: No visual changes.  ENT: No sore throat. Cardiovascular: As above Respiratory: Denies shortness of breath. Gastrointestinal: No abdominal pain.  No nausea, no vomiting.   Genitourinary: Negative for dysuria. Musculoskeletal: Negative for back pain. Skin: Negative for rash. Neurological: Negative for headaches or weakness   ____________________________________________   PHYSICAL EXAM:  VITAL SIGNS: ED Triage Vitals [11/13/18 1149]  Enc Vitals Group     BP      Pulse      Resp      Temp      Temp src      SpO2      Weight      Height      Head Circumference      Peak Flow      Pain Score 7     Pain Loc      Pain Edu?      Excl. in GC?     Constitutional: Alert and oriented. Eyes: Conjunctivae are normal.   Nose: No congestion/rhinnorhea. Mouth/Throat: Mucous membranes are moist.     Cardiovascular: Normal rate, regular rhythm. Grossly normal heart sounds.  Good peripheral circulation. Respiratory: Normal respiratory effort.  No retractions. Lungs CTAB. Gastrointestinal: Soft and nontender. No distention.  No CVA tenderness.  Musculoskeletal: No lower extremity tenderness nor edema.  Warm and well perfused Neurologic:  Normal speech and language. No gross focal neurologic deficits are appreciated.  Skin:  Skin is warm, dry and intact. No rash noted. Psychiatric: Mood and affect are normal. Speech and behavior are normal.  ____________________________________________   LABS (all labs ordered are listed, but only abnormal results  are displayed)  Labs Reviewed  BASIC METABOLIC PANEL - Abnormal; Notable for the following components:      Result Value   Potassium 3.4 (*)    Glucose, Bld 157 (*)    All other components within normal limits  TROPONIN I (HIGH SENSITIVITY) - Abnormal; Notable for the following components:   Troponin I (High Sensitivity) 18 (*)    All other components within normal limits  SARS CORONAVIRUS 2 (HOSPITAL ORDER, [place] LAB)  CBC  PROTIME-INR  APTT  TROPONIN I (HIGH SENSITIVITY)   ____________________________________________  EKG  ED ECG REPORT I, Lavonia Drafts, the attending physician, personally viewed and interpreted this ECG.  Date: 11/13/2018  Rhythm: normal sinus rhythm QRS Axis: normal Intervals: normal ST/T Wave abnormalities: ST depressions inferiorly and laterally Narrative Interpretation: Concerning EKG given history and new ST depressions  ____________________________________________  RADIOLOGY  Chest x-ray normal, viewed by me ____________________________________________   PROCEDURES  Procedure(s) performed: No  Procedures   Critical Care performed: yes  CRITICAL CARE Performed by: Lavonia Drafts   Total critical care time: 30 minutes  Critical care time was exclusive of  separately billable procedures and treating other patients.  Critical care was necessary to treat or prevent imminent or life-threatening deterioration.  Critical care was time spent personally by me on the following activities: development of treatment plan with patient and/or surrogate as well as nursing, discussions with consultants, evaluation of patient's response to treatment, examination of patient, obtaining history from patient or surrogate, ordering and performing treatments and interventions, ordering and review of laboratory studies, ordering and review of radiographic studies, pulse oximetry and re-evaluation of patient's condition.  ____________________________________________   INITIAL IMPRESSION / ASSESSMENT AND PLAN / ED COURSE  Pertinent labs & imaging results that were available during my care of the patient were reviewed by me and considered in my medical decision making (see chart for details).  Patient presents with chest pressure as described above, given EKG findings concerning for possible ACS.  In reviewing medical records it appears that patient had difficult catheterization initially performed in August 2019 with recommendation of tertiary care repeat.  Discussed with Dr. Clayborn Bigness of cardiology given the patient's EKG changes.   Patient's troponin is mildly elevated at 18.  Has had improvement after IV morphine.  Given his history I will start the patient on heparin as he is still having some mild chest discomfort and admit him to the hospitalist service    ____________________________________________   FINAL CLINICAL IMPRESSION(S) / ED DIAGNOSES  Final diagnoses:  Chest pain, unspecified type        Note:  This document was prepared using Dragon voice recognition software and may include unintentional dictation errors.   Lavonia Drafts, MD 11/13/18 1350

## 2018-11-13 NOTE — Consult Note (Signed)
[place] for Heparin dosing  Indication: ACS / STEMI  Allergies  Allergen Reactions  . Brilinta [Ticagrelor] Shortness Of Breath  . Penicillins Rash    Has patient had a PCN reaction causing immediate rash, facial/tongue/throat swelling, SOB or lightheadedness with hypotension: Yes Has patient had a PCN reaction causing severe rash involving mucus membranes or skin necrosis: No Has patient had a PCN reaction that required hospitalization: No Has patient had a PCN reaction occurring within the last 10 years: No If all of the above answers are "NO", then may proceed with Cephalosporin use.    Patient Measurements:   Heparin Dosing Weight: 83.9 kg (called patient's room and patient reported 185lbs.- information provided through Rhona Raider, RN)  --- I personally visited patient in his from to confirm significant weight.   Vital Signs:    Labs: Recent Labs    11/13/18 1159  HGB 14.9  HCT 43.5  PLT 318  CREATININE 1.03  TROPONINIHS 18*    CrCl cannot be calculated (Unknown ideal weight.).   Medications:  Plavix  Aspirin    Assessment: Patient reports not PTA anticoagulants and no parenteral anticoagulant given in the ED prior to consult. Patient presents with complaints of chest pain with an elevated troponin.   Goal of Therapy:  Heparin level 0.3-0.7 units/ml Monitor platelets by anticoagulation protocol: Yes   Plan:  Baseline labs have been ordered  Heparin DW: 83.9 kg Give 4000 units bolus x 1 Start heparin infusion at 1000 units/hr Check anti-Xa level in 6 hours and daily while on heparin, per protocol Continue to monitor H&H and platelets   Sydni Elizarraraz R Welford Christmas 11/13/2018,1:33 PM

## 2018-11-13 NOTE — Consult Note (Signed)
[place] for enoxparin  Indication: ACS / STEMI  Allergies  Allergen Reactions  . Brilinta [Ticagrelor] Shortness Of Breath  . Penicillins Rash    Has patient had a PCN reaction causing immediate rash, facial/tongue/throat swelling, SOB or lightheadedness with hypotension: Yes Has patient had a PCN reaction causing severe rash involving mucus membranes or skin necrosis: No Has patient had a PCN reaction that required hospitalization: No Has patient had a PCN reaction occurring within the last 10 years: No If all of the above answers are "NO", then may proceed with Cephalosporin use.    Patient Measurements: Height: 5\' 10"  (177.8 cm) Weight: 185 lb (83.9 kg) IBW/kg (Calculated) : 73 Heparin Dosing Weight: 83.9 kg (called patient's room and patient reported 185lbs.- information provided through Rhona Raider, RN)  --- I personally visited patient in his from to confirm significant weight.   Vital Signs: Temp: 98.2 F (36.8 C) (07/20 1920) Temp Source: Oral (07/20 1920) BP: 159/106 (07/20 1920) Pulse Rate: 80 (07/20 1920)  Labs: Recent Labs    11/13/18 1159 11/13/18 1335 11/13/18 1432 11/13/18 1757 11/13/18 2020  HGB 14.9  --   --   --   --   HCT 43.5  --   --   --   --   PLT 318  --   --   --   --   APTT  --  31  --   --   --   LABPROT  --  12.0  --   --   --   INR  --  0.9  --   --   --   CREATININE 1.03  --   --   --   --   TROPONINIHS 18*  --  49* 651* 1,486*    Estimated Creatinine Clearance: 83.7 mL/min (by C-G formula based on SCr of 1.03 mg/dL).   Medications:  Plavix  Aspirin    Assessment: Pharmacy originally consulted for heparin infusion dosing and montioring for ACS/Chest Pain and elevated troponin.   Lab unable to process anti-Xa level due to patients elevated Triglycerides of 662. Per lab blood is to lipemic and every attempt to run the samples resulted in an error.   Since heparin infusion can not be  monitored heparin infusion will be changed to enoxaparin.   Goal of Therapy:  Heparin level 0.3-0.7 units/ml Monitor platelets by anticoagulation protocol: Yes   Plan:  Stop heparin infusion. Start enoxaparin treatment dose 1mg /kg every 12 hours.  Enoxaparin 85mg  every 12 hours ordered.   Continue to monitor H&H and platelets   Pernell Dupre, PharmD, BCPS Clinical Pharmacist 11/13/2018 11:02 PM

## 2018-11-13 NOTE — ED Triage Notes (Signed)
Says chest pain since about 11am today that feels like his heart. Like pressure.

## 2018-11-13 NOTE — H&P (Addendum)
[employer] [facility] [place] [facility]   PATIENT NAME: Kenneth Morales    MR#:  [id number]  DATE OF BIRTH:  07/24/1965  DATE OF ADMISSION:  11/13/2018  PRIMARY CARE PHYSICIAN: [facility]   REQUESTING/REFERRING PHYSICIAN: Jene Everyobert Kinner, MD  CHIEF COMPLAINT:   Chief Complaint  Patient presents with  . Chest Pain    HISTORY OF PRESENT ILLNESS:  Kenneth Bilisimothy Wiehe  is a 53 y.o. male with a known history of CAD (s/p NSTEMI in 2013 with BMS to LCx, repeat NSTEMI in 2019 with unsuccessful attempt at PCI to RCA and 3rd Mrg), cocaine use, hypertension, hyperlipidemia, depression who presented to the ED with chest pain that started this morning at 11am.  He woke up around 9 AM and made breakfast.  He was feeling fine at the time.  He then went back to bed and suddenly had central chest pain.  The pain felt like a "pressure".  He also had associated diaphoresis and nausea.  No shortness of breath or vomiting.  He tried taking a nitro at home, which did not help.  In the ED, he was hypertensive.  Labs were significant for potassium 3.4, troponin 18.  EKG showed ST depression in the lateral leads. He was started on a heparin drip. Hospitalists were called for admission.  PAST MEDICAL HISTORY:   Past Medical History:  Diagnosis Date  . Acute MI (HCC) MAY 2013  . CAD (coronary artery disease)    NSTEMI in 05/13 in setting of Cocaine use. Cath: 99% mid LCX stenosis with a large thrombus. PCI and BMS (4.0 X15 mm Vision) placement to mid LCX, LAD: 20%, RCA: 30%, EF: 60%.   . Cocaine abuse (HCC)    quit in 08/2011  . Depression   . Hyperlipidemia   . Hypertension   . Spider bite   . Tobacco abuse     PAST SURGICAL HISTORY:   Past Surgical History:  Procedure Laterality Date  . CARDIAC CATHETERIZATION  MAY 2013   s/p stent @ [facility]  . CARDIAC CATHETERIZATION    . CORONARY CTO INTERVENTION N/A 01/27/2018   Procedure: CORONARY CTO INTERVENTION;  Surgeon:  Corky CraftsVaranasi, Jayadeep S, MD;  [facility];  Service: Cardiovascular;  Laterality: N/A;  . CORONARY STENT INTERVENTION N/A 11/29/2017   Procedure: CORONARY STENT INTERVENTION;  Surgeon: Alwyn Peaallwood, Dwayne D, MD;  Location: [facility] INVASIVE CV LAB;  Service: Cardiovascular;  Laterality: N/A;  . CORONARY STENT INTERVENTION N/A 01/27/2018   Procedure: CORONARY STENT INTERVENTION;  Surgeon: Corky CraftsVaranasi, Jayadeep S, MD;  Location: [facility] INVASIVE CV LAB;  Service: Cardiovascular;  Laterality: N/A;  mid cfx  . HEMORRHOID SURGERY    . LEFT HEART CATH AND CORONARY ANGIOGRAPHY Right 11/29/2017   Procedure: Left heart catheterization with possible PCI;  Surgeon: Laurier NancyKhan, Shaukat A, MD;  [facility];  Service: Cardiovascular;  Laterality: Right;  . LEFT HEART CATH AND CORONARY ANGIOGRAPHY N/A 01/27/2018   Procedure: LEFT HEART CATH AND CORONARY ANGIOGRAPHY;  Surgeon: Corky CraftsVaranasi, Jayadeep S, MD;  [facility];  Service: Cardiovascular;  Laterality: N/A;    SOCIAL HISTORY:   Social History   Tobacco Use  . Smoking status: Current Every Day Smoker    Packs/day: 0.50    Years: 30.00    Pack years: 15.00    Types: Cigarettes  . Smokeless tobacco: Never Used  Substance Use Topics  . Alcohol use: Yes    Alcohol/week: 6.0 standard drinks  Types: 6 Cans of beer per week    Comment: weekly    FAMILY HISTORY:   Family History  Problem Relation Age of Onset  . Heart attack Maternal Grandfather   . Heart attack Paternal Grandfather     DRUG ALLERGIES:   Allergies  Allergen Reactions  . Brilinta [Ticagrelor] Shortness Of Breath  . Penicillins Rash    Has patient had a PCN reaction causing immediate rash, facial/tongue/throat swelling, SOB or lightheadedness with hypotension: Yes Has patient had a PCN reaction causing severe rash involving mucus membranes or skin necrosis: No Has patient had a PCN reaction that required hospitalization: No Has patient had a PCN  reaction occurring within the last 10 years: No If all of the above answers are "NO", then may proceed with Cephalosporin use.    REVIEW OF SYSTEMS:   Review of Systems  Constitutional: Negative for chills and fever.  HENT: Negative for congestion and sore throat.   Eyes: Negative for blurred vision and double vision.  Respiratory: Negative for cough and shortness of breath.   Cardiovascular: Positive for chest pain. Negative for palpitations.  Gastrointestinal: Negative for nausea and vomiting.  Genitourinary: Negative for dysuria and urgency.  Musculoskeletal: Negative for back pain and neck pain.  Neurological: Negative for dizziness and headaches.  Psychiatric/Behavioral: Negative for depression. The patient is not nervous/anxious.     MEDICATIONS AT HOME:   Prior to Admission medications   Medication Sig Start Date End Date Taking? Authorizing Provider  acetaminophen (TYLENOL) 325 MG tablet Take 2 tablets (650 mg total) by mouth every 4 (four) hours as needed for mild pain (or temp > 37.5 C (99.5 F)). 07/14/18   Angiulli, Lavon Paganini, PA-C  amLODipine (NORVASC) 5 MG tablet Take 1 tablet (5 mg total) by mouth daily. 07/14/18   Angiulli, Lavon Paganini, PA-C  aspirin 81 MG tablet Take 1 tablet (81 mg total) by mouth daily. 07/18/18   Bayard Hugger, NP  atorvastatin (LIPITOR) 80 MG tablet Take 1 tablet (80 mg total) by mouth daily at 6 PM. 07/14/18   Angiulli, Lavon Paganini, PA-C  clopidogrel (PLAVIX) 75 MG tablet Take 1 tablet (75 mg total) by mouth daily. 07/14/18   Angiulli, Lavon Paganini, PA-C  famotidine (PEPCID) 20 MG tablet Take 1 tablet (20 mg total) by mouth 2 (two) times daily. 07/18/18   Bayard Hugger, NP  nitroGLYCERIN (NITROSTAT) 0.4 MG SL tablet Place 1 tablet (0.4mg  total) under the tongue every 5 minutes as needed for chest pain. May take 3 doses. 07/18/18   Bayard Hugger, NP  nortriptyline (PAMELOR) 10 MG capsule Take 10 mg by mouth at bedtime.    [provider]   polyethylene glycol (MIRALAX / GLYCOLAX) packet Take 17 g by mouth daily. 07/08/18   Stark Jock Jude, MD  ranolazine (RANEXA) 500 MG 12 hr tablet Take 1 tablet (500 mg total) by mouth 2 (two) times daily. 07/14/18   Angiulli, Lavon Paganini, PA-C      VITAL SIGNS:  There were no vitals taken for this visit.  PHYSICAL EXAMINATION:  Physical Exam  GENERAL:  53 y.o.-year-old patient lying in the bed with no acute distress.  EYES: Pupils equal, round, reactive to light and accommodation. No scleral icterus. Extraocular muscles intact.  HEENT: Head atraumatic, normocephalic. Oropharynx and nasopharynx clear.  NECK:  Supple, no jugular venous distention. No thyroid enlargement, no tenderness.  LUNGS: Normal breath sounds bilaterally, no wheezing, rales,rhonchi or crepitation. No use of accessory muscles of respiration.  CARDIOVASCULAR: RRR, S1, S2 normal. No murmurs, rubs, or gallops.  ABDOMEN: Soft, nontender, nondistended. Bowel sounds present. No organomegaly or mass.  EXTREMITIES: No pedal edema, cyanosis, or clubbing.  NEUROLOGIC: Cranial nerves II through XII are intact. 4-5/5 muscle strength on the left and 5/5 muscle strength on the right. Sensation intact. Gait not checked.  PSYCHIATRIC: The patient is alert and oriented x 3.  SKIN: No obvious rash, lesion, or ulcer.   LABORATORY PANEL:   CBC Recent Labs  Lab 11/13/18 1159  WBC 8.2  HGB 14.9  HCT 43.5  PLT 318   ------------------------------------------------------------------------------------------------------------------  Chemistries  Recent Labs  Lab 11/13/18 1159  NA 140  K 3.4*  CL 108  CO2 23  GLUCOSE 157*  BUN 14  CREATININE 1.03  CALCIUM 9.3   ------------------------------------------------------------------------------------------------------------------  Cardiac Enzymes No results for input(s): TROPONINI in the last 168 hours.  ------------------------------------------------------------------------------------------------------------------  RADIOLOGY:  Dg Chest 2 View  Result Date: 11/13/2018 CLINICAL DATA:  Chest pain. EXAM: CHEST - 2 VIEW COMPARISON:  01/26/2018. FINDINGS: Mediastinum hilar structures normal. Heart size normal. No focal infiltrate. No pleural effusion or pneumothorax. Chest is stable from 01/26/2018. IMPRESSION: No acute cardiopulmonary disease. Electronically Signed   By: Maisie Fushomas  Register   On: 11/13/2018 12:31      IMPRESSION AND PLAN:   Chest pain- patient with a known history of CAD s/p NSTEMI in 2013 with BMS to LCx, repeat NSTEMI in 2019 with unsuccessful attempt at PCI to RCA and 3rd Mrg -Initial troponin 18- will trend -EKG with ST depressions in the lateral leads -Continue heparin drip [facility] -Check ECHO -Check LDL and A1c -Continue home aspirin, plavix, ranexa -SL nitro prn -No beta blocker due to cocaine abuse  Hypokalemia -Replete and recheck  Hypertension- BP elevated in the ED -Continue home norvasc -IV hydralazine prn  Hyperlipidemia- stable -Continue home lipitor  History of cocaine abuse -UDS ordered  History of stroke with residual left sided deficits- no new focal deficits -Continue aspirin, plavix, and lipitor  All the records are reviewed and case discussed with ED provider. Management plans discussed with the patient, family and they are in agreement.  CODE STATUS: full  TOTAL TIME TAKING CARE OF THIS PATIENT: 45 minutes.    Jinny BlossomKaty D Montey Ebel M.D on 11/13/2018 at 1:44 PM  Between 7am to 6pm - Pager [phone]  After 6pm go to [email or web address] - Scientist, research (life sciences)password EPAS [facility]  [facility]  Office  [phone]  CC: Primary care physician; [facility]   Note: This dictation was prepared with Nurse, children'sDragon dictation along with smaller phrase technology. Any transcriptional errors that result from  this process are unintentional.

## 2018-11-13 NOTE — ED Notes (Signed)
Called lab regarding blue tube that was sent with original ordered blood tubes.

## 2018-11-13 NOTE — ED Notes (Signed)
Date and time results received: 11/13/18 3:21 PM  (use smartphrase ".now" to insert current time)  Test: troponin Critical Value: increased from 18 to 31  Name of Provider Notified: Dr. Brett Albino  Orders Received? Or Actions Taken?: awaiting orders/transfer to floor

## 2018-11-13 NOTE — ED Notes (Signed)
Pt's mother called for update; pt spoke with her.

## 2018-11-13 NOTE — ED Notes (Signed)
ED TO INPATIENT HANDOFF REPORT  ED Nurse Name and Phone #: Laurena Beringmary Braylie Badami [phone]  S Name/Age/Gender Kenneth Morales 53 y.o. male Room/Bed: ED04A/ED04A  Code Status   Code Status: Prior  Home/SNF/Other Home Patient oriented to: self, place, time and situation Is this baseline? Yes   Triage Complete: Triage complete  Chief Complaint CP   Triage Note Says chest pain since about 11am today that feels like his heart. Like pressure.   Allergies Allergies  Allergen Reactions  . Brilinta [Ticagrelor] Shortness Of Breath  . Penicillins Rash    Has patient had a PCN reaction causing immediate rash, facial/tongue/throat swelling, SOB or lightheadedness with hypotension: Yes Has patient had a PCN reaction causing severe rash involving mucus membranes or skin necrosis: No Has patient had a PCN reaction that required hospitalization: No Has patient had a PCN reaction occurring within the last 10 years: No If all of the above answers are "NO", then may proceed with Cephalosporin use.    Level of Care/Admitting Diagnosis ED Disposition    ED Disposition Condition Comment   Admit  [facility]ospital [facility] [[id number]]  Level of Care: Telemetry [5]  Covid Evaluation: Asymptomatic Screening Protocol (No Symptoms)  Diagnosis: Chest pain [[id number]]  Admitting Physician: Willadean CarolMAYO, KATY DODD [[id number]]  Attending Physician: Willadean CarolMAYO, KATY DODD [[id number]]  Estimated length of stay: past midnight tomorrow  Certification:: I certify this patient will need inpatient services for at least 2 midnights  PT Class (Do Not Modify): Inpatient [101]  PT Acc Code (Do Not Modify): Private [1]       B Medical/Surgery History Past Medical History:  Diagnosis Date  . Acute MI (HCC) MAY 2013  . CAD (coronary artery disease)    NSTEMI in 05/13 in setting of Cocaine use. Cath: 99% mid LCX stenosis with a large thrombus. PCI and BMS (4.0 X15 mm Vision) placement to mid LCX, LAD: 20%, RCA: 30%,  EF: 60%.   . Cocaine abuse (HCC)    quit in 08/2011  . Depression   . Hyperlipidemia   . Hypertension   . Spider bite   . Tobacco abuse    Past Surgical History:  Procedure Laterality Date  . CARDIAC CATHETERIZATION  MAY 2013   s/p stent @ [facility]  . CARDIAC CATHETERIZATION    . CORONARY CTO INTERVENTION N/A 01/27/2018   Procedure: CORONARY CTO INTERVENTION;  Surgeon: Corky CraftsVaranasi, Jayadeep S, MD;  [facility];  Service: Cardiovascular;  Laterality: N/A;  . CORONARY STENT INTERVENTION N/A 11/29/2017   Procedure: CORONARY STENT INTERVENTION;  Surgeon: Alwyn Peaallwood, Dwayne D, MD;  Location: [facility] INVASIVE CV LAB;  Service: Cardiovascular;  Laterality: N/A;  . CORONARY STENT INTERVENTION N/A 01/27/2018   Procedure: CORONARY STENT INTERVENTION;  Surgeon: Corky CraftsVaranasi, Jayadeep S, MD;  Location: [facility] INV[facility] CV LAB;  Service: Cardiovascular;  Laterality: N/A;  mid cfx  . HEMORRHOID SURGERY    . LEFT HEART CATH AND CORONARY ANGIOGRAPHY Right 11/29/2017   Procedure: Left heart catheterization with possible PCI;  Surgeon: Laurier NancyKhan, Shaukat A, MD;  [facility];  Service: Cardiovascular;  Laterality: Right;  . LEFT HEART CATH AND CORONARY ANGIOGRAPHY N/A 01/27/2018   Procedure: LEFT HEART CATH AND CORONARY ANGIOGRAPHY;  Surgeon: Corky CraftsVaranasi, Jayadeep S, MD;  [facility];  Service: Cardiovascular;  Laterality: N/A;     A IV Location/Drains/Wounds Patient Lines/Drains/Airways Status   Active Line/Drains/Airways    Name:   Placement date:   Placement time:  Site:   Days:   Peripheral IV 11/13/18 Right Wrist   11/13/18    1206    Wrist   less than 1          Intake/Output Last 24 hours No intake or output data in the 24 hours ending 11/13/18 1416  Labs/Imaging Results for orders placed or performed during the hospital encounter of 11/13/18 (from the past 48 hour(s))  Basic metabolic panel     Status: Abnormal   Collection Time: 11/13/18 11:59 AM  Result Value Ref  Range   Sodium 140 135 - 145 mmol/L   Potassium 3.4 (L) 3.5 - 5.1 mmol/L   Chloride 108 98 - 111 mmol/L   CO2 23 22 - 32 mmol/L   Glucose, Bld 157 (H) 70 - 99 mg/dL   BUN 14 6 - 20 mg/dL   Creatinine, Ser 1.611.03 0.61 - 1.24 mg/dL   Calcium 9.3 8.9 - 09.610.3 mg/dL   GFR calc non Af Amer >60 >60 mL/min   GFR calc Af Amer >60 >60 mL/min   Anion gap 9 5 - 15    Comment: Performed at [facility], [address] [place], [place] [address]  CBC     Status: None   Collection Time: 11/13/18 11:59 AM  Result Value Ref Range   WBC 8.2 4.0 - 10.5 K/uL   RBC 4.66 4.22 - 5.81 MIL/uL   Hemoglobin 14.9 13.0 - 17.0 g/dL   HCT 98.143.5 19.139.0 - 47.852.0 %   MCV 93.3 80.0 - 100.0 fL   MCH 32.0 26.0 - 34.0 pg   MCHC 34.3 30.0 - 36.0 g/dL   RDW 29.513.0 62.111.5 - 30.815.5 %   Platelets 318 150 - 400 K/uL   nRBC 0.0 0.0 - 0.2 %    Comment: Performed at [facility], [address] [place], [place] [address]  Troponin I (High Sensitivity)     Status: Abnormal   Collection Time: 11/13/18 11:59 AM  Result Value Ref Range   Troponin I (High Sensitivity) 18 (H) <18 ng/L    Comment: (NOTE) Elevated high sensitivity troponin I (hsTnI) values and significant  changes across serial measurements may suggest ACS but many other  chronic and acute conditions are known to elevate hsTnI results.  Refer to the "Links" section for chest pain algorithms and additional  guidance. Performed at [facility], [address] [place], [place] [address]   Protime-INR     Status: None   Collection Time: 11/13/18  1:35 PM  Result Value Ref Range   Prothrombin Time 12.0 11.4 - 15.2 seconds   INR 0.9 0.8 - 1.2    Comment: (NOTE) INR goal varies based on device and disease states. Performed at [facility], [address] [place], [place] [address]   APTT     Status: None   Collection Time: 11/13/18  1:35 PM  Result Value Ref Range   aPTT 31 24 - 36 seconds    Comment: Performed at [facility], [address] [place], [place] [address]   Dg Chest 2 View  Result Date: 11/13/2018 CLINICAL DATA:  Chest pain. EXAM: CHEST - 2 VIEW COMPARISON:  01/26/2018. FINDINGS: Mediastinum hilar structures normal. Heart size normal. No focal infiltrate. No pleural effusion or pneumothorax. Chest is stable from 01/26/2018. IMPRESSION: No acute cardiopulmonary disease. Electronically Signed   By: Maisie Fushomas  Register   On: 11/13/2018 12:31    Pending Labs Unresulted Labs (From admission, onward)  Start     Ordered   11/14/18 0500  CBC  Tomorrow morning,   STAT     11/13/18 1350   11/13/18 2030  Heparin level (unfractionated)  Once-Timed,   STAT     11/13/18 1400   11/13/18 1325  SARS Coronavirus 2 (CEPHEID - Performed in [place] hospital lab), Hosp Order  (Asymptomatic Patients Labs)  Once,   STAT    Question:  Rule Out  Answer:  Yes   11/13/18 1324   Signed and Held  Urine Drug Screen, Qualitative ([place] only)  Once,   R     Signed and Held   Signed and Held  Hemoglobin A1c  Once,   R     Signed and Held   Signed and Held  Lipid panel  Once,   R     Signed and Held          Vitals/Pain Today's Vitals   11/13/18 1205 11/13/18 1205 11/13/18 1230 11/13/18 1300  BP:   (!) 158/101 (!) 156/103  Pulse:   75 73  Resp:   11 12  SpO2:   98% 97%  Weight:    83.9 kg  PainSc: 8  8       Isolation Precautions No active isolations  Medications Medications  sodium chloride flush (NS) 0.9 % injection 3 mL (3 mLs Intravenous Not Given 11/13/18 1206)  heparin bolus via infusion 4,000 Units (has no administration in time range)  heparin ADULT infusion 100 units/mL (25000 units/214mL sodium chloride 0.45%) (has no administration in time range)  morphine 2 MG/ML injection 2 mg (2 mg Intravenous Given 11/13/18 1226)  ondansetron (ZOFRAN) injection 4 mg (4 mg Intravenous Given 11/13/18 1223)    Mobility walks Low fall risk   Focused Assessments Cardiac Assessment  Handoff:  Cardiac Rhythm: Normal sinus rhythm Lab Results  Component Value Date   CKTOTAL 194 10/05/2012   CKMB 0.9 10/05/2012   TROPONINI 0.03 ([place]) 07/05/2018   No results found for: DDIMER Does the Patient currently have chest pain? Yes     R Recommendations: See Admitting Provider Note  Report given to:   Additional Notes:

## 2018-11-14 ENCOUNTER — Ambulatory Visit: Payer: Medicaid Other | Admitting: Occupational Therapy

## 2018-11-14 LAB — CBC
HCT: 46.2 % (ref 39.0–52.0)
Hemoglobin: 16.1 g/dL (ref 13.0–17.0)
MCH: 32.7 pg (ref 26.0–34.0)
MCHC: 34.8 g/dL (ref 30.0–36.0)
MCV: 93.7 fL (ref 80.0–100.0)
Platelets: 310 10*3/uL (ref 150–400)
RBC: 4.93 MIL/uL (ref 4.22–5.81)
RDW: 12.8 % (ref 11.5–15.5)
WBC: 10.5 10*3/uL (ref 4.0–10.5)
nRBC: 0 % (ref 0.0–0.2)

## 2018-11-14 LAB — COMPREHENSIVE METABOLIC PANEL
ALT: 30 U/L (ref 0–44)
AST: 90 U/L — ABNORMAL HIGH (ref 15–41)
Albumin: 4 g/dL (ref 3.5–5.0)
Alkaline Phosphatase: 79 U/L (ref 38–126)
Anion gap: 12 (ref 5–15)
BUN: 14 mg/dL (ref 6–20)
CO2: 20 mmol/L — ABNORMAL LOW (ref 22–32)
Calcium: 9.8 mg/dL (ref 8.9–10.3)
Chloride: 102 mmol/L (ref 98–111)
Creatinine, Ser: 0.75 mg/dL (ref 0.61–1.24)
GFR calc Af Amer: >60 mL/min (ref >60)
GFR calc non Af Amer: >60 mL/min (ref >60)
Glucose, Bld: 121 mg/dL — ABNORMAL HIGH (ref 70–99)
Potassium: 3.7 mmol/L (ref 3.5–5.1)
Sodium: 134 mmol/L — ABNORMAL LOW (ref 135–145)
Total Bilirubin: 0.8 mg/dL (ref 0.3–1.2)
Total Protein: 7.3 g/dL (ref 6.5–8.1)

## 2018-11-14 LAB — URINE DRUG SCREEN, QUALITATIVE (ARMC ONLY)
Amphetamines, Ur Screen: NOT DETECTED
Barbiturates, Ur Screen: NOT DETECTED
Benzodiazepine, Ur Scrn: NOT DETECTED
Cannabinoid 50 Ng, Ur ~~LOC~~: NOT DETECTED
Cocaine Metabolite,Ur ~~LOC~~: POSITIVE — AB
MDMA (Ecstasy)Ur Screen: NOT DETECTED
Methadone Scn, Ur: NOT DETECTED
Opiate, Ur Screen: NOT DETECTED
Phencyclidine (PCP) Ur S: NOT DETECTED
Tricyclic, Ur Screen: NOT DETECTED

## 2018-11-14 LAB — TROPONIN I (HIGH SENSITIVITY): Troponin I (High Sensitivity): 11355 ng/L (ref <18)

## 2018-11-14 LAB — ECHOCARDIOGRAM COMPLETE: Weight: 2960 oz

## 2018-11-14 MED ORDER — SODIUM CHLORIDE 0.9 % WEIGHT BASED INFUSION
3.0000 mL/kg/h | INTRAVENOUS | Status: AC
  Administered 2018-11-15: 3 mL/kg/h via INTRAVENOUS

## 2018-11-14 MED ORDER — SODIUM CHLORIDE 0.9% FLUSH: 3.0000 mL | Freq: Two times a day (BID) | INTRAVENOUS | Status: DC

## 2018-11-14 MED ORDER — SODIUM CHLORIDE 0.9 % WEIGHT BASED INFUSION
1.0000 mL/kg/h | INTRAVENOUS | Status: DC
  Administered 2018-11-15: 1 mL/kg/h via INTRAVENOUS

## 2018-11-14 MED ORDER — ASPIRIN 81 MG PO CHEW
81.0000 mg | CHEWABLE_TABLET | ORAL | Status: AC
  Administered 2018-11-15: 81 mg via ORAL
  Filled 2018-11-14: qty 1

## 2018-11-14 MED ORDER — SODIUM CHLORIDE 0.9% FLUSH: 3.0000 mL | INTRAVENOUS | Status: DC | PRN

## 2018-11-14 MED ORDER — SODIUM CHLORIDE 0.9 % IV SOLN: 250.0000 mL | INTRAVENOUS | Status: DC | PRN

## 2018-11-14 NOTE — Progress Notes (Signed)
[facility] NAME: Kenneth Morales    MR#:  [id number]  DATE OF BIRTH:  1965/11/01  SUBJECTIVE:   Chief Complaint  Patient presents with  . Chest Pain    Patient is seen at the bedside. He is sitting up eating breakfast. Report chest pain has improved rating 2/10 on a pain intensity rating scale. No other associated symptoms.  REVIEW OF SYSTEMS:  Review of Systems  Constitutional: Negative for chills, fever, malaise/fatigue and weight loss.  HENT: Negative for congestion, hearing loss and sore throat.   Eyes: Negative for blurred vision and double vision.  Respiratory: Negative for cough, shortness of breath and wheezing.   Cardiovascular: Positive for chest pain. Negative for palpitations, orthopnea and leg swelling.  Gastrointestinal: Negative for abdominal pain, diarrhea, nausea and vomiting.  Genitourinary: Negative for dysuria and urgency.  Musculoskeletal: Negative for myalgias.  Skin: Negative for rash.  Neurological: Positive for focal weakness. Negative for dizziness, sensory change, speech change and headaches.  Psychiatric/Behavioral: Negative for depression.    DRUG ALLERGIES:   Allergies  Allergen Reactions  . Brilinta [Ticagrelor] Shortness Of Breath  . Penicillins Rash    Has patient had a PCN reaction causing immediate rash, facial/tongue/throat swelling, SOB or lightheadedness with hypotension: Yes Has patient had a PCN reaction causing severe rash involving mucus membranes or skin necrosis: No Has patient had a PCN reaction that required hospitalization: No Has patient had a PCN reaction occurring within the last 10 years: No If all of the above answers are "NO", then may proceed with Cephalosporin use.   VITALS:  Blood pressure 120/82, pulse 100, temperature 98.7 F (37.1 C), temperature source Oral, resp. rate 16, height 5\' 10"  (1.778 m), weight 84.5 kg, SpO2 97 %. PHYSICAL EXAMINATION:   GENERAL:  53  y.o.-year-old patient lying in the bed with no acute distress.  EYES: Pupils equal, round, reactive to light and accommodation. No scleral icterus. Extraocular muscles intact.  HEENT: Head atraumatic, normocephalic. Oropharynx and nasopharynx clear.  NECK:  Supple, no jugular venous distention. No thyroid enlargement, no tenderness.  LUNGS: Normal breath sounds bilaterally, no wheezing, rales,rhonchi or crepitation. No use of accessory muscles of respiration.  CARDIOVASCULAR: S1, S2 normal. No murmurs, rubs, or gallops.  ABDOMEN: Soft, nontender, nondistended. Bowel sounds present. No organomegaly or mass.  EXTREMITIES: No pedal edema, cyanosis, or clubbing. No rash or lesions. + pedal pulses MUSCULOSKELETAL: Normal bulk, and power was 5+ grip and elbow, knee, and ankle flexion and extension bilaterally.  NEUROLOGIC:Cranial nerves II through XII are intact. 4-5/5 muscle strength on the left and 5/5 muscle strength on the right. DTR's (biceps, patellar, and achilles) 2+ and symmetric throughout. Gait not tested due to safety concern. PSYCHIATRIC: The patient is alert and oriented x 3.  SKIN: No obvious rash, lesion, or ulcer.   DATA REVIEWED:  LABORATORY PANEL:  Male CBC Recent Labs  Lab 11/14/18 0543  WBC 10.5  HGB 16.1  HCT 46.2  PLT 310   ------------------------------------------------------------------------------------------------------------------ Chemistries  Recent Labs  Lab 11/14/18 0822  NA 134*  K 3.7  CL 102  CO2 20*  GLUCOSE 121*  BUN 14  CREATININE 0.75  CALCIUM 9.8  AST 90*  ALT 30  ALKPHOS 79  BILITOT 0.8   RADIOLOGY:  Dg Chest 2 View  Result Date: 11/13/2018 CLINICAL DATA:  Chest pain. EXAM: CHEST - 2 VIEW COMPARISON:  01/26/2018. FINDINGS: Mediastinum hilar structures normal. Heart size normal. No focal  infiltrate. No pleural effusion or pneumothorax. Chest is stable from 01/26/2018. IMPRESSION: No acute cardiopulmonary disease. Electronically Signed    By: Maisie Fushomas  Register   On: 11/13/2018 12:31   ASSESSMENT AND PLAN:   53 y.o. male with a known history of CAD (s/p NSTEMI in 2013 with BMS to LCx, repeat NSTEMI in 2019 with unsuccessful attempt at PCI to RCA and 3rd Mrg), cocaine use, hypertension, hyperlipidemia, depression who presenting with chest pain .   1. Chest pain - Patient with known hx of CAD-NSTEMI in 05/13 in setting of Cocaine use. Cath: 99% mid LCX stenosis with a large thrombus. PCI and BMS (4.0 X15 mm Vision) placement to mid LCX, LAD: 20%, RCA: 30%, EF: 60%.  - Continue to trend troponin - Heparin was d/c'd and started on lovenox due to unable to draw blood and run it. - ASA 81mg  PO daily - Clopidogrel 75mg  PO daily and Ranexa - Echo with LVEF >65% - Cardiology input appreciated, for possible cath tomorrow -SL nitro and Morphine prn -No beta blocker due to cocaine abuse - HTN, HLD, DM control as below  2. HLD + Goal LDL<100 - Atorvastatin 80mg  PO qhs  3. HTN  + Goal BP <140/80 - Continue Amlodipine  4. Polysubstance abuse - UDS +for cocaine - Counseling provided  5. History of stroke with residual left sided deficits- no new focal deficits -Continue aspirin, plavix, and lipitor  All the records are reviewed and case discussed with Care Management/Social Worker. Management plans discussed with the patient, family and they are in agreement.  CODE STATUS: Full Code  TOTAL TIME TAKING CARE OF THIS PATIENT: 38 minutes.   More than 50% of the time was spent in counseling/coordination of care: YES  POSSIBLE D/C IN 1-2 DAYS, DEPENDING ON CLINICAL CONDITION.   11/14/2018 at 7:54 PM  Webb SilversmithElizabeth Tysheem Accardo, DNP, FNP-BC [facility] Nurse Practitioner Between 7am to 6pm - Pager - [phone]  After 6pm go to [email or web address] - Administrator, Civil Servicepassword EPAS [facility]  [facility]  Office  [phone]  [employer] [place] [facility]  Office  [phone]  CC: Primary care physician; [facility]  Note: This dictation was prepared with Nurse, children'sDragon dictation along with smaller phrase technology. Any transcriptional errors that result from this process are unintentional.

## 2018-11-14 NOTE — Consult Note (Signed)
Reason for Consult: Non-STEMI chest pain abnormal EKG  Referring Physician: Dr. Bertrum SolKathy [facility] primary  Cardiologist Vernie Piet  Kenneth Morales is an 53 y.o. male.  HPI: 53 year old male known coronary disease non-STEMI 2013 bare-metal stent to circumflex non-STEMI in 2019.  Patient had unsuccessful PCI to RCA and third marginal has had some substance abuse in the past.  History of hypertension hyperlipidemia depression presented to emergency room with chest pain symptoms which was relatively persistent midsternal.  Patient woke up at 9 AM and made breakfast and started feeling recurrent chest pain symptoms pressure in the midsternal chest mild shortness of breath denies any blackout spells or syncope.  Patient had elevated troponin emergency room with EKG changes diffuse ST segment changes.  Past Medical History:  Diagnosis Date  . Acute MI (HCC) MAY 2013  . CAD (coronary artery disease)    NSTEMI in 05/13 in setting of Cocaine use. Cath: 99% mid LCX stenosis with a large thrombus. PCI and BMS (4.0 X15 mm Vision) placement to mid LCX, LAD: 20%, RCA: 30%, EF: 60%.   . Cocaine abuse (HCC)    quit in 08/2011  . Depression   . Hyperlipidemia   . Hypertension   . Spider bite   . Tobacco abuse     Past Surgical History:  Procedure Laterality Date  . CARDIAC CATHETERIZATION  MAY 2013   s/p stent @ [facility]  . CARDIAC CATHETERIZATION    . CORONARY CTO INTERVENTION N/A 01/27/2018   Procedure: CORONARY CTO INTERVENTION;  Surgeon: Corky CraftsVaranasi, Jayadeep S, MD;  [facility];  Service: Cardiovascular;  Laterality: N/A;  . CORONARY STENT INTERVENTION N/A 11/29/2017   Procedure: CORONARY STENT INTERVENTION;  Surgeon: Alwyn Peaallwood, Jodilyn Giese D, MD;  Location: [facility] INVASIVE CV LAB;  Service: Cardiovascular;  Laterality: N/A;  . CORONARY STENT INTERVENTION N/A 01/27/2018   Procedure: CORONARY STENT INTERVENTION;  Surgeon: Corky CraftsVaranasi, Jayadeep S, MD;  Location: [facility] INVAS[facility] CV LAB;   Service: Cardiovascular;  Laterality: N/A;  mid cfx  . HEMORRHOID SURGERY    . LEFT HEART CATH AND CORONARY ANGIOGRAPHY Right 11/29/2017   Procedure: Left heart catheterization with possible PCI;  Surgeon: Laurier NancyKhan, Shaukat A, MD;  [facility];  Service: Cardiovascular;  Laterality: Right;  . LEFT HEART CATH AND CORONARY ANGIOGRAPHY N/A 01/27/2018   Procedure: LEFT HEART CATH AND CORONARY ANGIOGRAPHY;  Surgeon: Corky CraftsVaranasi, Jayadeep S, MD;  [facility];  Service: Cardiovascular;  Laterality: N/A;    Family History  Problem Relation Age of Onset  . Heart attack Maternal Grandfather   . Heart attack Paternal Grandfather     Social History:  reports that he has been smoking cigarettes. He has a 15.00 pack-year smoking history. He has never used smokeless tobacco. He reports current alcohol use of about 6.0 standard drinks of alcohol per week. He reports that he does not use drugs.  Allergies:  Allergies  Allergen Reactions  . Brilinta [Ticagrelor] Shortness Of Breath  . Penicillins Rash    Has patient had a PCN reaction causing immediate rash, facial/tongue/throat swelling, SOB or lightheadedness with hypotension: Yes Has patient had a PCN reaction causing severe rash involving mucus membranes or skin necrosis: No Has patient had a PCN reaction that required hospitalization: No Has patient had a PCN reaction occurring within the last 10 years: No If all of the above answers are "NO", then may proceed with Cephalosporin use.    Medications: I have reviewed the patient's  current medications.  Results for orders placed or performed during the hospital encounter of 11/13/18 (from the past 48 hour(s))  Basic metabolic panel     Status: Abnormal   Collection Time: 11/13/18 11:59 AM  Result Value Ref Range   Sodium 140 135 - 145 mmol/L   Potassium 3.4 (L) 3.5 - 5.1 mmol/L   Chloride 108 98 - 111 mmol/L   CO2 23 22 - 32 mmol/L   Glucose, Bld 157 (H) 70 - 99 mg/dL    BUN 14 6 - 20 mg/dL   Creatinine, Ser 1.61 0.61 - 1.24 mg/dL   Calcium 9.3 8.9 - 09.6 mg/dL   GFR calc non Af Amer >60 >60 mL/min   GFR calc Af Amer >60 >60 mL/min   Anion gap 9 5 - 15    Comment: Performed at [facility], [address] [place], [place] [address]  CBC     Status: None   Collection Time: 11/13/18 11:59 AM  Result Value Ref Range   WBC 8.2 4.0 - 10.5 K/uL   RBC 4.66 4.22 - 5.81 MIL/uL   Hemoglobin 14.9 13.0 - 17.0 g/dL   HCT 98.1 19.1 - 47.8 %   MCV 93.3 80.0 - 100.0 fL   MCH 32.0 26.0 - 34.0 pg   MCHC 34.3 30.0 - 36.0 g/dL   RDW 29.5 62.1 - 30.8 %   Platelets 318 150 - 400 K/uL   nRBC 0.0 0.0 - 0.2 %    Comment: Performed at [facility], [address] [place], [place] [address]  Troponin I (High Sensitivity)     Status: Abnormal   Collection Time: 11/13/18 11:59 AM  Result Value Ref Range   Troponin I (High Sensitivity) 18 (H) <18 ng/L    Comment: (NOTE) Elevated high sensitivity troponin I (hsTnI) values and significant  changes across serial measurements may suggest ACS but many other  chronic and acute conditions are known to elevate hsTnI results.  Refer to the "Links" section for chest pain algorithms and additional  guidance. Performed at [facility], [address] [place], [place] [address]   Hemoglobin A1c     Status: Abnormal   Collection Time: 11/13/18 11:59 AM  Result Value Ref Range   Hgb A1c MFr Bld 5.7 (H) 4.8 - 5.6 %    Comment: (NOTE) Pre diabetes:          5.7%-6.4% Diabetes:              >6.4% Glycemic control for   <7.0% adults with diabetes    Mean Plasma Glucose 116.89 mg/dL    Comment: Performed at [facility], [address], [place], [place] [address]  Lipid panel     Status: Abnormal   Collection Time: 11/13/18 11:59 AM  Result Value Ref Range   Cholesterol 216 (H) 0 - 200 mg/dL   Triglycerides 324 (H) <150 mg/dL   HDL 29 (L) >40 mg/dL   Total CHOL/HDL Ratio 7.4 RATIO   VLDL  UNABLE TO CALCULATE IF TRIGLYCERIDE OVER 400 mg/dL 0 - 40 mg/dL   LDL Cholesterol UNABLE TO CALCULATE IF TRIGLYCERIDE OVER 400 mg/dL 0 - 99 mg/dL    Comment:        Total Cholesterol/HDL:CHD Risk Coronary Heart Disease Risk Table                     Men   Women  1/2 Average Risk   3.4  3.3  Average Risk       5.0   4.4  2 X Average Risk   9.6   7.1  3 X Average Risk  23.4   11.0        Use the calculated Patient Ratio above and the CHD Risk Table to determine the patient's CHD Risk.        ATP III CLASSIFICATION (LDL):  <100     mg/dL   Optimal  100-129  mg/dL   Near or Above                    Optimal  130-159  mg/dL   Borderline  160-189  mg/dL   High  >190     mg/dL   Very High Performed at [facility], [address] [place], [place] [address]   LDL cholesterol, direct     Status: Abnormal   Collection Time: 11/13/18 11:59 AM  Result Value Ref Range   Direct LDL 122.8 (H) 0 - 99 mg/dL    Comment: Performed at [facility] [address], [place], [place] [address]  Protime-INR     Status: None   Collection Time: 11/13/18  1:35 PM  Result Value Ref Range   Prothrombin Time 12.0 11.4 - 15.2 seconds   INR 0.9 0.8 - 1.2    Comment: (NOTE) INR goal varies based on device and disease states. Performed at [facility], [address] [place], [place] [address]   APTT     Status: None   Collection Time: 11/13/18  1:35 PM  Result Value Ref Range   aPTT 31 24 - 36 seconds    Comment: Performed at [facility], [place], [place] [address]  Troponin I (High Sensitivity)     Status: Abnormal   Collection Time: 11/13/18  2:32 PM  Result Value Ref Range   Troponin I (High Sensitivity) 49 (H) <18 ng/L    Comment: READ BACK AND VERIFIED WITH MARY NEEDHAM @1519  11/13/18 MJU (NOTE) Elevated high sensitivity troponin I (hsTnI) values and significant  changes across serial measurements may suggest ACS but many other  chronic  and acute conditions are known to elevate hsTnI results.  Refer to the "Links" section for chest pain algorithms and additional  guidance. Performed at [facility], [address] [place], [place] [address]   SARS Coronavirus 2 (CEPHEID - Performed in [facility] hospital lab), Hosp Order     Status: None   Collection Time: 11/13/18  2:32 PM   Specimen: Nasopharyngeal Swab  Result Value Ref Range   SARS Coronavirus 2 NEGATIVE NEGATIVE    Comment: (NOTE) If result is NEGATIVE SARS-CoV-2 target nucleic acids are NOT DETECTED. The SARS-CoV-2 RNA is generally detectable in upper and lower  respiratory specimens during the acute phase of infection. The lowest  concentration of SARS-CoV-2 viral copies this assay can detect is 250  copies / mL. A negative result does not preclude SARS-CoV-2 infection  and should not be used as the sole basis for treatment or other  patient management decisions.  A negative result may occur with  improper specimen collection / handling, submission of specimen other  than nasopharyngeal swab, presence of viral mutation(s) within the  areas targeted by this assay, and inadequate number of viral copies  (<250 copies / mL). A negative result must be combined with clinical  observations, patient history, and epidemiological information. If result is POSITIVE  SARS-CoV-2 target nucleic acids are DETECTED. The SARS-CoV-2 RNA is generally detectable in upper and lower  respiratory specimens dur ing the acute phase of infection.  Positive  results are indicative of active infection with SARS-CoV-2.  Clinical  correlation with patient history and other diagnostic information is  necessary to determine patient infection status.  Positive results do  not rule out bacterial infection or co-infection with other viruses. If result is PRESUMPTIVE POSTIVE SARS-CoV-2 nucleic acids MAY BE PRESENT.   A presumptive positive result was obtained on the submitted  specimen  and confirmed on repeat testing.  While 2019 novel coronavirus  (SARS-CoV-2) nucleic acids may be present in the submitted sample  additional confirmatory testing may be necessary for epidemiological  and / or clinical management purposes  to differentiate between  SARS-CoV-2 and other Sarbecovirus currently known to infect humans.  If clinically indicated additional testing with an alternate test  methodology [phone]([id number]) is advised. The SARS-CoV-2 RNA is generally  detectable in upper and lower respiratory sp ecimens during the acute  phase of infection. The expected result is Negative. Fact Sheet for Patients:  BoilerBrush.com.cyhttps://www.fda.gov/media/136312/download Fact Sheet for Healthcare Providers: https://pope.com/https://www.fda.gov/media/136313/download This test is not yet approved or cleared by the [place] FDA and has been authorized for detection and/or diagnosis of SARS-CoV-2 by FDA under an Emergency Use Authorization (EUA).  This EUA will remain in effect (meaning this test can be used) for the duration of the COVID-19 declaration under Section 564(b)(1) of the Act, 21 U.S.C. section 360bbb-3(b)(1), unless the authorization is terminated or revoked sooner. Performed at [facility], [address] [place], [place] [address]   Troponin I (High Sensitivity)     Status: Abnormal   Collection Time: 11/13/18  5:57 PM  Result Value Ref Range   Troponin I (High Sensitivity) 651 (HH) <18 ng/L    Comment: CRITICAL RESULT CALLED TO, READ BACK BY AND VERIFIED WITH [facility] RN AT 1900 10/2018 SNG (NOTE) Elevated high sensitivity troponin I (hsTnI) values and significant  changes across serial measurements may suggest ACS but many other  chronic and acute conditions are known to elevate hsTnI results.  Refer to the "Links" section for chest pain algorithms and additional  guidance. Performed at [facility], [address] [place], [place] [address]   Troponin  I (High Sensitivity)     Status: Abnormal   Collection Time: 11/13/18  8:20 PM  Result Value Ref Range   Troponin I (High Sensitivity) 1,486 (HH) <18 ng/L    Comment: CRITICAL VALUE NOTED. VALUE IS CONSISTENT WITH PREVIOUSLY REPORTED/CALLED VALUE MJU (NOTE) Elevated high sensitivity troponin I (hsTnI) values and significant  changes across serial measurements may suggest ACS but many other  chronic and acute conditions are known to elevate hsTnI results.  Refer to the "Links" section for chest pain algorithms and additional  guidance. Performed at [facility], [address] [place], [place] [address]   Urine Drug Screen, Qualitative [facility] only)     Status: Abnormal   Collection Time: 11/14/18 12:38 AM  Result Value Ref Range   Tricyclic, Ur Screen NONE DETECTED NONE DETECTED   Amphetamines, Ur Screen NONE DETECTED NONE DETECTED   MDMA (Ecstasy)Ur Screen NONE DETECTED NONE DETECTED   Cocaine Metabolite,Ur [place] POSITIVE (A) NONE DETECTED   Opiate, Ur Screen NONE DETECTED NONE DETECTED   Phencyclidine (PCP) Ur S NONE DETECTED NONE DETECTED   Cannabinoid 50 Ng, Ur [place] NONE DETECTED NONE DETECTED   Barbiturates, Ur  Screen NONE DETECTED NONE DETECTED   Benzodiazepine, Ur Scrn NONE DETECTED NONE DETECTED   Methadone Scn, Ur NONE DETECTED NONE DETECTED    Comment: (NOTE) Tricyclics + metabolites, urine    Cutoff 1000 ng/mL Amphetamines + metabolites, urine  Cutoff 1000 ng/mL MDMA (Ecstasy), urine              Cutoff 500 ng/mL Cocaine Metabolite, urine          Cutoff 300 ng/mL Opiate + metabolites, urine        Cutoff 300 ng/mL Phencyclidine (PCP), urine         Cutoff 25 ng/mL Cannabinoid, urine                 Cutoff 50 ng/mL Barbiturates + metabolites, urine  Cutoff 200 ng/mL Benzodiazepine, urine              Cutoff 200 ng/mL Methadone, urine                   Cutoff 300 ng/mL The urine drug screen provides only a preliminary, unconfirmed analytical test result and should  not be used for non-medical purposes. Clinical consideration and professional judgment should be applied to any positive drug screen result due to possible interfering substances. A more specific alternate chemical method must be used in order to obtain a confirmed analytical result. Gas chromatography / mass spectrometry (GC/MS) is the preferred confirmat ory method. Performed at [facility], [address] [place], [place] [address]   CBC     Status: None   Collection Time: 11/14/18  5:43 AM  Result Value Ref Range   WBC 10.5 4.0 - 10.5 K/uL   RBC 4.93 4.22 - 5.81 MIL/uL   Hemoglobin 16.1 13.0 - 17.0 g/dL   HCT 08.6 57.8 - 46.9 %   MCV 93.7 80.0 - 100.0 fL   MCH 32.7 26.0 - 34.0 pg   MCHC 34.8 30.0 - 36.0 g/dL   RDW 62.9 52.8 - 41.3 %   Platelets 310 150 - 400 K/uL   nRBC 0.0 0.0 - 0.2 %    Comment: Performed at [facility], [address], [place], [place] [address]  Troponin I (High Sensitivity)     Status: Abnormal   Collection Time: 11/14/18  8:22 AM  Result Value Ref Range   Troponin I (High Sensitivity) 11,355 (HH) <18 ng/L    Comment: CRITICAL RESULT CALLED TO, READ BACK BY AND VERIFIED WITH JESSICA CHRISTMAS AT 0955 ON 11/14/2018 [facility]. (NOTE) Elevated high sensitivity troponin I (hsTnI) values and significant  changes across serial measurements may suggest ACS but many other  chronic and acute conditions are known to elevate hsTnI results.  Refer to the "Links" section for chest pain algorithms and additional  guidance. Performed at [facility], [address] [place], [place] [address]   Comprehensive metabolic panel     Status: Abnormal   Collection Time: 11/14/18  8:22 AM  Result Value Ref Range   Sodium 134 (L) 135 - 145 mmol/L   Potassium 3.7 3.5 - 5.1 mmol/L   Chloride 102 98 - 111 mmol/L   CO2 20 (L) 22 - 32 mmol/L   Glucose, Bld 121 (H) 70 - 99 mg/dL   BUN 14 6 - 20 mg/dL   Creatinine, Ser 3.66 0.61 - 1.24 mg/dL    Calcium 9.8 8.9 - 44.0 mg/dL   Total Protein 7.3 6.5 - 8.1 g/dL   Albumin 4.0 3.5 - 5.0  g/dL   AST 90 (H) 15 - 41 U/L   ALT 30 0 - 44 U/L   Alkaline Phosphatase 79 38 - 126 U/L   Total Bilirubin 0.8 0.3 - 1.2 mg/dL   GFR calc non Af Amer >60 >60 mL/min   GFR calc Af Amer >60 >60 mL/min   Anion gap 12 5 - 15    Comment: Performed at [facility], [address] [place], [place] [address]    Dg Chest 2 View  Result Date: 11/13/2018 CLINICAL DATA:  Chest pain. EXAM: CHEST - 2 VIEW COMPARISON:  01/26/2018. FINDINGS: Mediastinum hilar structures normal. Heart size normal. No focal infiltrate. No pleural effusion or pneumothorax. Chest is stable from 01/26/2018. IMPRESSION: No acute cardiopulmonary disease. Electronically Signed   By: Maisie Fushomas  Register   On: 11/13/2018 12:31    Review of Systems  Constitutional: Positive for malaise/fatigue.  HENT: Negative.   Eyes: Negative.   Respiratory: Positive for shortness of breath.   Cardiovascular: Positive for chest pain.  Gastrointestinal: Negative.   Genitourinary: Negative.   Musculoskeletal: Negative.   Skin: Negative.   Neurological: Negative.   Endo/Heme/Allergies: Negative.   Psychiatric/Behavioral: Negative.    Blood pressure (!) 135/92, pulse 98, temperature 98.2 F (36.8 C), temperature source Oral, resp. rate 18, height 5\' 10"  (1.778 m), weight 84.5 kg, SpO2 95 %. Physical Exam  Nursing note and vitals reviewed. Constitutional: He is oriented to person, place, and time. He appears well-developed and well-nourished.  HENT:  Head: Normocephalic and atraumatic.  Eyes: Pupils are equal, round, and reactive to light. Conjunctivae and EOM are normal.  Neck: Normal range of motion. Neck supple.  Cardiovascular: Normal rate, regular rhythm and normal heart sounds.  Respiratory: Effort normal and breath sounds normal.  GI: Soft. Bowel sounds are normal.  Musculoskeletal: Normal range of motion.  Neurological: He is alert  and oriented to person, place, and time. He has normal reflexes.  Skin: Skin is warm and dry.  Psychiatric: He has a normal mood and affect.    Assessment/Plan: Non-STEMI Coronary artery disease History of PCI stent Unstable angina Hypertension Smoking Hyperlipidemia History of substance abuse . Plan Agree with admission to telemetry Follow-up EKGs and troponins Continue intravenous heparin therapy for anticoagulation Echocardiogram will be helpful for left ventricular dysfunction Continue blood pressure control Continue Plavix aspirin Continue statin therapy Lipitor 80 mg once a day Continue Pepcid consider omeprazole Consider cardiac cath prior to discharge  Kenneth Morales 11/14/2018, 1:43 PM

## 2018-11-14 NOTE — Progress Notes (Signed)
Dr. Clayborn Bigness made aware of elevated troponin of 11, 355/ poss heart cath 7/22 per MD/ will monitor.

## 2018-11-14 NOTE — Plan of Care (Signed)
Pt's high throughout the night, PRN hydralazine given twice, finally came down this morning Vitals:   11/14/18 0600 11/14/18 0601  BP: (!) 143/101 (!) 143/97  Pulse: 89 89  Resp:    Temp:    SpO2:     Heparin was d/c'd and started on lovenox due to unable to draw blood and run it.  Problem: Clinical Measurements: Goal: Respiratory complications will improve Outcome: Progressing Note: On room air   Problem: Activity: Goal: Risk for activity intolerance will decrease Outcome: Progressing Note: Up to bathroom with standby assist, tolerating well   Problem: Nutrition: Goal: Adequate nutrition will be maintained Outcome: Progressing   Problem: Coping: Goal: Level of anxiety will decrease Outcome: Progressing   Problem: Elimination: Goal: Will not experience complications related to urinary retention Outcome: Progressing   Problem: Pain Managment: Goal: General experience of comfort will improve Outcome: Progressing Note: Complaints of a headache once, treated with tylenol, which gave relief   Problem: Safety: Goal: Ability to remain free from injury will improve Outcome: Progressing   Problem: Skin Integrity: Goal: Risk for impaired skin integrity will decrease Outcome: Progressing

## 2018-11-14 NOTE — Progress Notes (Signed)
Subjective:  Patient states chest pain much improved since yesterday feels much better but is concerned about being a single parent and is eager to go home  Objective:  Vital Signs in the last 24 hours: Temp:  [98.2 F (36.8 C)-98.4 F (36.9 C)] 98.2 F (36.8 C) (07/21 0822) Pulse Rate:  [73-98] 98 (07/21 0822) Resp:  [9-20] 18 (07/21 0822) BP: (135-180)/(92-108) 135/92 (07/21 0822) SpO2:  [95 %-100 %] 95 % (07/21 0822) Weight:  [84.5 kg] 84.5 kg (07/21 0139)  Intake/Output from previous day: 07/20 0701 - 07/21 0700 In: 374.8 [P.O.:240; I.V.:134.8] Out: 900 [Urine:900] Intake/Output from this shift: Total I/O In: 240 [P.O.:240] Out: 0   Physical Exam: General appearance: appears stated age Neck: no adenopathy, no carotid bruit, no JVD, supple, symmetrical, trachea midline and thyroid not enlarged, symmetric, no tenderness/mass/nodules Lungs: clear to auscultation bilaterally Heart: regular rate and rhythm, S1, S2 normal, no murmur, click, rub or gallop Abdomen: soft, non-tender; bowel sounds normal; no masses,  no organomegaly Extremities: extremities normal, atraumatic, no cyanosis or edema Pulses: 2+ and symmetric Skin: Skin color, texture, turgor normal. No rashes or lesions Neurologic: Alert and oriented X 3, normal strength and tone. Normal symmetric reflexes. Normal coordination and gait  Lab Results: Recent Labs    11/13/18 1159 11/14/18 0543  WBC 8.2 10.5  HGB 14.9 16.1  PLT 318 310   Recent Labs    11/13/18 1159 11/14/18 0822  NA 140 134*  K 3.4* 3.7  CL 108 102  CO2 23 20*  GLUCOSE 157* 121*  BUN 14 14  CREATININE 1.03 0.75   No results for input(s): TROPONINI in the last 72 hours.  Invalid input(s): CK, MB Hepatic Function Panel Recent Labs    11/14/18 0822  PROT 7.3  ALBUMIN 4.0  AST 90*  ALT 30  ALKPHOS 79  BILITOT 0.8   Recent Labs    11/13/18 1159  CHOL 216*   No results for input(s): PROTIME in the last 72  hours.  Imaging: Imaging results have been reviewed  Cardiac Studies:  Assessment/Plan:  Non-STEMI Status post PCI and stent Angina Chest Pain Coronary Artery Disease Coronary Artery Stent Ischemic Heart Disease Shortness of Breath  Hyperlipidemia . Plan Continue telemetry Elevated troponins suggestive of non-STEMI Agree with echocardiogram Recommend cardiac cath in the morning Continue Plavix and Lipitor and aspirin  LOS: 1 day    Emersen Mascari D Heraclio Seidman 11/14/2018, 2:03 PM

## 2018-11-15 ENCOUNTER — Encounter
Admission: EM | Disposition: A | Discharge status: Home / Self Care | Payer: Self-pay | Source: Home / Self Care | Attending: Nurse Practitioner

## 2018-11-15 HISTORY — PX: LEFT HEART CATH AND CORONARY ANGIOGRAPHY: CATH118249

## 2018-11-15 SURGERY — LEFT HEART CATH AND CORONARY ANGIOGRAPHY
Anesthesia: Moderate Sedation

## 2018-11-15 MED ORDER — SODIUM CHLORIDE 0.9 % IV BOLUS
INTRAVENOUS | Status: AC | PRN
  Administered 2018-11-15: 1000 mL via INTRAVENOUS

## 2018-11-15 MED ORDER — FENTANYL CITRATE (PF) 100 MCG/2ML IJ SOLN
INTRAMUSCULAR | Status: DC | PRN
  Administered 2018-11-15: 25 ug via INTRAVENOUS

## 2018-11-15 MED ORDER — SODIUM CHLORIDE 0.9 % IV SOLN: 250.0000 mL | INTRAVENOUS | Status: DC | PRN

## 2018-11-15 MED ORDER — MIDAZOLAM HCL 2 MG/2ML IJ SOLN
INTRAMUSCULAR | Status: AC
  Filled 2018-11-15: qty 2

## 2018-11-15 MED ORDER — HYDRALAZINE HCL 20 MG/ML IJ SOLN: 10.0000 mg | INTRAMUSCULAR | Status: DC | PRN

## 2018-11-15 MED ORDER — ACETAMINOPHEN 325 MG PO TABS: 650.0000 mg | ORAL_TABLET | ORAL | Status: DC | PRN

## 2018-11-15 MED ORDER — HEPARIN (PORCINE) IN NACL 1000-0.9 UT/500ML-% IV SOLN
INTRAVENOUS | Status: AC
  Filled 2018-11-15: qty 1000

## 2018-11-15 MED ORDER — ONDANSETRON HCL 4 MG/2ML IJ SOLN: 4.0000 mg | Freq: Four times a day (QID) | INTRAMUSCULAR | Status: DC | PRN

## 2018-11-15 MED ORDER — HEPARIN (PORCINE) IN NACL 1000-0.9 UT/500ML-% IV SOLN
INTRAVENOUS | Status: DC | PRN
  Administered 2018-11-15: 500 mL

## 2018-11-15 MED ORDER — IOHEXOL 300 MG/ML  SOLN
INTRAMUSCULAR | Status: DC | PRN
  Administered 2018-11-15: 150 mL via INTRA_ARTERIAL

## 2018-11-15 MED ORDER — FENTANYL CITRATE (PF) 100 MCG/2ML IJ SOLN
INTRAMUSCULAR | Status: AC
  Filled 2018-11-15: qty 2

## 2018-11-15 MED ORDER — LABETALOL HCL 5 MG/ML IV SOLN: 10.0000 mg | INTRAVENOUS | Status: DC | PRN

## 2018-11-15 MED ORDER — SODIUM CHLORIDE 0.9% FLUSH: 3.0000 mL | INTRAVENOUS | Status: DC | PRN

## 2018-11-15 MED ORDER — SODIUM CHLORIDE 0.9% FLUSH: 3.0000 mL | Freq: Two times a day (BID) | INTRAVENOUS | Status: DC

## 2018-11-15 MED ORDER — MIDAZOLAM HCL 2 MG/2ML IJ SOLN
INTRAMUSCULAR | Status: DC | PRN
  Administered 2018-11-15: 1 mg via INTRAVENOUS

## 2018-11-15 MED ORDER — SODIUM CHLORIDE 0.9 % WEIGHT BASED INFUSION: 1.0000 mL/kg/h | INTRAVENOUS | Status: DC

## 2018-11-15 SURGICAL SUPPLY — 11 items
CATH INFINITI 5FR ANG PIGTAIL (CATHETERS) ×1 IMPLANT
CATH INFINITI 5FR JL4 (CATHETERS) ×1 IMPLANT
CATH INFINITI 5FR JL5 (CATHETERS) ×1 IMPLANT
CATH INFINITI JR4 5F (CATHETERS) ×1 IMPLANT
DEVICE CLOSURE MYNXGRIP 5F (Vascular Products) ×1 IMPLANT
KIT MANI 3VAL PERCEP (MISCELLANEOUS) ×2 IMPLANT
NDL PERC 18GX7CM (NEEDLE) IMPLANT
NEEDLE PERC 18GX7CM (NEEDLE) ×2 IMPLANT
PACK CARDIAC CATH (CUSTOM PROCEDURE TRAY) ×2 IMPLANT
SHEATH AVANTI 5FR X 11CM (SHEATH) ×1 IMPLANT
WIRE GUIDERIGHT .035X150 (WIRE) ×1 IMPLANT

## 2018-11-15 NOTE — Discharge Summary (Signed)
[facility] - [place] [facility]   PATIENT NAME: Kenneth Morales    MR#:  [id number]  DATE OF BIRTH:  1965-05-04  DATE OF ADMISSION:  11/13/2018 ADMITTING PHYSICIAN: Campbell StallKaty Dodd Mayo, MD  DATE OF DISCHARGE: 11/16/2018  PRIMARY CARE PHYSICIAN: [facility]    ADMISSION DIAGNOSIS:  Chest pain, unspecified type [R07.9]  DISCHARGE DIAGNOSIS:  NSTEMI  SECONDARY DIAGNOSIS:   Past Medical History:  Diagnosis Date  . Acute MI (HCC) MAY 2013  . CAD (coronary artery disease)    NSTEMI in 05/13 in setting of Cocaine use. Cath: 99% mid LCX stenosis with a large thrombus. PCI and BMS (4.0 X15 mm Vision) placement to mid LCX, LAD: 20%, RCA: 30%, EF: 60%.   . Cocaine abuse (HCC)    quit in 08/2011  . Depression   . Hyperlipidemia   . Hypertension   . Spider bite   . Tobacco abuse     HOSPITAL COURSE:  53 year old male with history of CAD status post multiple stents, tobacco dependence and hypertension who presents to the hospital due to chest pain.  1.  Non-STEMI: Cath shows EF 50% with 50 % ostial LAD  99% dx1 95% mid RCA old. As per cardiology not a dood candidate for a stent. Dr Juliann Paresallwood has recomended to continue current medical manangement and he will be referred for possible CABG after his Cardiology follow up.  Continue aspirin, Plavix, Ranexa No beta-blocker due to cocaine abuse  2.  Hyperlipidemia: Continue atorvastatin  3.  Essential hypertension: Continue Norvasc  4.  Cocaine abuse: Patient counseled against using illicit drugs 5. Tobacco dependence: Patient is encouraged to quit smoking and willing to attempt to quit was assessed. Patient highly motivated.Counseling was provided for 4 minutes.  6.  History of CVA with residual left-sided deficits: Continue aspirin, Plavix and statin   DISCHARGE CONDITIONS AND DIET:  Stable Cardiac diet  CONSULTS OBTAINED:    DRUG ALLERGIES:   Allergies  Allergen Reactions  .  Brilinta [Ticagrelor] Shortness Of Breath  . Penicillins Rash    Has patient had a PCN reaction causing immediate rash, facial/tongue/throat swelling, SOB or lightheadedness with hypotension: Yes Has patient had a PCN reaction causing severe rash involving mucus membranes or skin necrosis: No Has patient had a PCN reaction that required hospitalization: No Has patient had a PCN reaction occurring within the last 10 years: No If all of the above answers are "NO", then may proceed with Cephalosporin use.    DISCHARGE MEDICATIONS:   Allergies as of 11/15/2018      Reactions   Brilinta [ticagrelor] Shortness Of Breath   Penicillins Rash   Has patient had a PCN reaction causing immediate rash, facial/tongue/throat swelling, SOB or lightheadedness with hypotension: Yes Has patient had a PCN reaction causing severe rash involving mucus membranes or skin necrosis: No Has patient had a PCN reaction that required hospitalization: No Has patient had a PCN reaction occurring within the last 10 years: No If all of the above answers are "NO", then may proceed with Cephalosporin use.      Medication List    TAKE these medications   acetaminophen 325 MG tablet Commonly known as: TYLENOL Take 2 tablets (650 mg total) by mouth every 4 (four) hours as needed for mild pain (or temp > 37.5 C (99.5 F)).   amLODipine 5 MG tablet Commonly known as: NORVASC Take 1 tablet (5 mg total) by mouth daily.   aspirin 81 MG tablet  Take 1 tablet (81 mg total) by mouth daily.   atorvastatin 80 MG tablet Commonly known as: LIPITOR Take 1 tablet (80 mg total) by mouth daily at 6 PM.   clopidogrel 75 MG tablet Commonly known as: PLAVIX Take 1 tablet (75 mg total) by mouth daily.   famotidine 20 MG tablet Commonly known as: Pepcid Take 1 tablet (20 mg total) by mouth 2 (two) times daily.   nitroGLYCERIN 0.4 MG SL tablet Commonly known as: NITROSTAT Place 1 tablet (0.4mg  total) under the tongue every 5  minutes as needed for chest pain. May take 3 doses.   nortriptyline 10 MG capsule Commonly known as: PAMELOR Take 20 mg by mouth at bedtime.   polyethylene glycol 17 g packet Commonly known as: MIRALAX / GLYCOLAX Take 17 g by mouth daily.   ranolazine 500 MG 12 hr tablet Commonly known as: RANEXA Take 1 tablet (500 mg total) by mouth 2 (two) times daily.         Today   CHIEF COMPLAINT:  No CP doing ok    VITAL SIGNS:  Blood pressure 123/82, pulse 75, temperature 98 F (36.7 C), temperature source Oral, resp. rate 19, height 5\' 10"  (1.778 m), weight 84.3 kg, SpO2 100 %.   REVIEW OF SYSTEMS:  Review of Systems  Constitutional: Negative.  Negative for chills, fever and malaise/fatigue.  HENT: Negative.  Negative for ear discharge, ear pain, hearing loss, nosebleeds and sore throat.   Eyes: Negative.  Negative for blurred vision and pain.  Respiratory: Negative.  Negative for cough, hemoptysis, shortness of breath and wheezing.   Cardiovascular: Negative.  Negative for chest pain, palpitations and leg swelling.  Gastrointestinal: Negative.  Negative for abdominal pain, blood in stool, diarrhea, nausea and vomiting.  Genitourinary: Negative.  Negative for dysuria.  Musculoskeletal: Negative.  Negative for back pain.  Skin: Negative.   Neurological: Negative for dizziness, tremors, speech change, focal weakness, seizures and headaches.  Endo/Heme/Allergies: Negative.  Does not bruise/bleed easily.  Psychiatric/Behavioral: Negative.  Negative for depression, hallucinations and suicidal ideas.     PHYSICAL EXAMINATION:  GENERAL:  53 y.o.-year-old patient lying in the bed with no acute distress.  NECK:  Supple, no jugular venous distention. No thyroid enlargement, no tenderness.  LUNGS: Normal breath sounds bilaterally, no wheezing, rales,rhonchi  No use of accessory muscles of respiration.  CARDIOVASCULAR: S1, S2 normal. No murmurs, rubs, or gallops.  ABDOMEN: Soft,  non-tender, non-distended. Bowel sounds present. No organomegaly or mass.  EXTREMITIES: No pedal edema, cyanosis, or clubbing.  PSYCHIATRIC: The patient is alert and oriented x 3.  SKIN: No obvious rash, lesion, or ulcer.   DATA REVIEW:   CBC Recent Labs  Lab 11/14/18 0543  WBC 10.5  HGB 16.1  HCT 46.2  PLT 310    Chemistries  Recent Labs  Lab 11/14/18 0822  NA 134*  K 3.7  CL 102  CO2 20*  GLUCOSE 121*  BUN 14  CREATININE 0.75  CALCIUM 9.8  AST 90*  ALT 30  ALKPHOS 79  BILITOT 0.8    Cardiac Enzymes No results for input(s): TROPONINI in the last 168 hours.  Microbiology Results  @MICRORSLT48 @  RADIOLOGY:  No results found.    Allergies as of 11/15/2018      Reactions   Brilinta [ticagrelor] Shortness Of Breath   Penicillins Rash   Has patient had a PCN reaction causing immediate rash, facial/tongue/throat swelling, SOB or lightheadedness with hypotension: Yes Has patient had a PCN reaction causing  severe rash involving mucus membranes or skin necrosis: No Has patient had a PCN reaction that required hospitalization: No Has patient had a PCN reaction occurring within the last 10 years: No If all of the above answers are "NO", then may proceed with Cephalosporin use.      Medication List    TAKE these medications   acetaminophen 325 MG tablet Commonly known as: TYLENOL Take 2 tablets (650 mg total) by mouth every 4 (four) hours as needed for mild pain (or temp > 37.5 C (99.5 F)).   amLODipine 5 MG tablet Commonly known as: NORVASC Take 1 tablet (5 mg total) by mouth daily.   aspirin 81 MG tablet Take 1 tablet (81 mg total) by mouth daily.   atorvastatin 80 MG tablet Commonly known as: LIPITOR Take 1 tablet (80 mg total) by mouth daily at 6 PM.   clopidogrel 75 MG tablet Commonly known as: PLAVIX Take 1 tablet (75 mg total) by mouth daily.   famotidine 20 MG tablet Commonly known as: Pepcid Take 1 tablet (20 mg total) by mouth 2 (two)  times daily.   nitroGLYCERIN 0.4 MG SL tablet Commonly known as: NITROSTAT Place 1 tablet (0.4mg  total) under the tongue every 5 minutes as needed for chest pain. May take 3 doses.   nortriptyline 10 MG capsule Commonly known as: PAMELOR Take 20 mg by mouth at bedtime.   polyethylene glycol 17 g packet Commonly known as: MIRALAX / GLYCOLAX Take 17 g by mouth daily.   ranolazine 500 MG 12 hr tablet Commonly known as: RANEXA Take 1 tablet (500 mg total) by mouth 2 (two) times daily.          Management plans discussed with the patient and he is in agreement. Stable for discharge home  Patient should follow up with [facility]  CODE STATUS:     Code Status Orders  (From admission, onward)         Start     Ordered   11/13/18 1610  Full code  Continuous     11/13/18 1609        Code Status History    Date Active Date Inactive Code Status Order ID Comments User Context   07/07/2018 1410 07/15/2018 1246 Full Code [id number]  Elizabeth Sauer Inpatient   07/07/2018 1410 07/07/2018 1410 Full Code [id number]  Cathlyn Parsons, PA-C Inpatient   07/04/2018 1426 07/07/2018 1356 Full Code [id number]  Bettey Costa, MD ED   01/26/2018 2014 01/28/2018 1658 Full Code [id number]  Theora Gianotti, NP Inpatient   11/28/2017 2334 11/30/2017 1805 Full Code [id number]  Amelia Jo, MD Inpatient   07/01/2016 2104 07/02/2016 1419 Full Code [id number]  Theodoro Grist, MD Inpatient   Advance Care Planning Activity      TOTAL TIME TAKING CARE OF THIS PATIENT: 38 minutes.    Note: This dictation was prepared with Dragon dictation along with smaller phrase technology. Any transcriptional errors that result from this process are unintentional.  Bettey Costa M.D on 11/15/2018 at 4:43 PM  Between 7am to 6pm - Pager - [phone] After 6pm go to [email or web address] - password [email or web address] [place] [facility]  Office  [phone]  CC: Primary care physician; [facility], [place]

## 2018-11-15 NOTE — Progress Notes (Signed)
Pt ambulated around nursing station/ tolerated well/ r groin WNL/ Discharge instructions explained to pt/ verbalized an understanding/ iv and tele removed/ will transport off unit via wheelchair when his ride arrives

## 2018-11-15 NOTE — Consult Note (Signed)
Center Line for enoxparin  Indication: ACS / STEMI  Allergies  Allergen Reactions  . Brilinta [Ticagrelor] Shortness Of Breath  . Penicillins Rash    Has patient had a PCN reaction causing immediate rash, facial/tongue/throat swelling, SOB or lightheadedness with hypotension: Yes Has patient had a PCN reaction causing severe rash involving mucus membranes or skin necrosis: No Has patient had a PCN reaction that required hospitalization: No Has patient had a PCN reaction occurring within the last 10 years: No If all of the above answers are "NO", then may proceed with Cephalosporin use.    Patient Measurements: Height: 5\' 10"  (177.8 cm) Weight: 185 lb 14.4 oz (84.3 kg) IBW/kg (Calculated) : 73 Heparin Dosing Weight: 83.9 kg (called patient's room and patient reported 185lbs.- information provided through Rhona Raider, RN)  --- I personally visited patient in his from to confirm significant weight.   Vital Signs: Temp: 98 F (36.7 C) (07/22 1229) Temp Source: Oral (07/22 0728) BP: 116/71 (07/22 1229) Pulse Rate: 77 (07/22 1229)  Labs: Recent Labs    11/13/18 1159 11/13/18 1335  11/13/18 1757 11/13/18 2020 11/14/18 0543 11/14/18 0822  HGB 14.9  --   --   --   --  16.1  --   HCT 43.5  --   --   --   --  46.2  --   PLT 318  --   --   --   --  310  --   APTT  --  31  --   --   --   --   --   LABPROT  --  12.0  --   --   --   --   --   INR  --  0.9  --   --   --   --   --   CREATININE 1.03  --   --   --   --   --  0.75  TROPONINIHS 18*  --    < > 651* 1,486*  --  11,355*   < > = values in this interval not displayed.    Estimated Creatinine Clearance: 107.7 mL/min (by C-G formula based on SCr of 0.75 mg/dL).   Medications:  Plavix  Aspirin    Assessment: Pharmacy originally consulted for heparin infusion dosing and montioring for ACS/Chest Pain and elevated troponin.   Lab unable to process anti-Xa level due to patients elevated  Triglycerides of 662. Per lab, blood is to lipemic and every attempt to run the samples resulted in an error.   Since heparin infusion can not be monitored heparin infusion will be changed to enoxaparin.   Goal of Therapy:  Heparin level 0.3-0.7 units/ml Monitor platelets by anticoagulation protocol: Yes   Plan:  Stop heparin infusion. Start enoxaparin treatment dose 1mg /kg every 12 hours.  Enoxaparin 85mg  every 12 hours ordered.   Continue to monitor H&H and platelets   Oswald Hillock, PharmD, BCPS Clinical Pharmacist 11/15/2018 12:39 PM

## 2018-11-15 NOTE — Progress Notes (Signed)
[place] at [place] NAME: Kenneth Morales    MR#:  [id number]  DATE OF BIRTH:  07-18-1965  SUBJECTIVE:   Patient doing okay this morning.  Denies chest pain or shortness of breath.  Patient plan for cardiac catheterization today.  REVIEW OF SYSTEMS:    Review of Systems  Constitutional: Negative for fever, chills weight loss HENT: Negative for ear pain, nosebleeds, congestion, facial swelling, rhinorrhea, neck pain, neck stiffness and ear discharge.   Respiratory: Negative for cough, shortness of breath, wheezing  Cardiovascular: Negative for chest pain, palpitations and leg swelling.  Gastrointestinal: Negative for heartburn, abdominal pain, vomiting, diarrhea or consitpation Genitourinary: Negative for dysuria, urgency, frequency, hematuria Musculoskeletal: Negative for back pain or joint pain Neurological: Negative for dizziness, seizures, syncope, focal weakness,  numbness and headaches.  Hematological: Does not bruise/bleed easily.  Psychiatric/Behavioral: Negative for hallucinations, confusion, dysphoric mood    Tolerating Diet: N.p.o.      DRUG ALLERGIES:   Allergies  Allergen Reactions  . Brilinta [Ticagrelor] Shortness Of Breath  . Penicillins Rash    Has patient had a PCN reaction causing immediate rash, facial/tongue/throat swelling, SOB or lightheadedness with hypotension: Yes Has patient had a PCN reaction causing severe rash involving mucus membranes or skin necrosis: No Has patient had a PCN reaction that required hospitalization: No Has patient had a PCN reaction occurring within the last 10 years: No If all of the above answers are "NO", then may proceed with Cephalosporin use.    VITALS:  Blood pressure 113/82, pulse 77, temperature 97.8 F (36.6 C), temperature source Oral, resp. rate 19, height 5\' 10"  (1.778 m), weight 84.3 kg, SpO2 100 %.  PHYSICAL EXAMINATION:  Constitutional: Appears well-developed and  well-nourished. No distress. HENT: Normocephalic. Marland Kitchen Oropharynx is clear and moist.  Eyes: Conjunctivae and EOM are normal. PERRLA, no scleral icterus.  Neck: Normal ROM. Neck supple. No JVD. No tracheal deviation. CVS: RRR, S1/S2 +, 2/6 murmurs, no gallops, no carotid bruit.  Pulmonary: Effort and breath sounds normal, no stridor, rhonchi, wheezes, rales.  Abdominal: Soft. BS +,  no distension, tenderness, rebound or guarding.  Musculoskeletal: old left sided defecit from previous CVA 4/5 LUE Neuro: Alert. CN 2-12 grossly intact. No focal deficits. Skin: Skin is warm and dry. No rash noted. Psychiatric: Normal mood and affect.      LABORATORY PANEL:   CBC Recent Labs  Lab 11/14/18 0543  WBC 10.5  HGB 16.1  HCT 46.2  PLT 310   ------------------------------------------------------------------------------------------------------------------  Chemistries  Recent Labs  Lab 11/14/18 0822  NA 134*  K 3.7  CL 102  CO2 20*  GLUCOSE 121*  BUN 14  CREATININE 0.75  CALCIUM 9.8  AST 90*  ALT 30  ALKPHOS 79  BILITOT 0.8   ------------------------------------------------------------------------------------------------------------------  Cardiac Enzymes No results for input(s): TROPONINI in the last 168 hours. ------------------------------------------------------------------------------------------------------------------  RADIOLOGY:  Dg Chest 2 View  Result Date: 11/13/2018 CLINICAL DATA:  Chest pain. EXAM: CHEST - 2 VIEW COMPARISON:  01/26/2018. FINDINGS: Mediastinum hilar structures normal. Heart size normal. No focal infiltrate. No pleural effusion or pneumothorax. Chest is stable from 01/26/2018. IMPRESSION: No acute cardiopulmonary disease. Electronically Signed   By: Marcello Moores  Register   On: 11/13/2018 12:31     ASSESSMENT AND PLAN:   53 year old male with history of CAD status post multiple stents, tobacco dependence and hypertension who presents to the hospital  due to chest pain.  1.  Non-STEMI: Patient plan for cardiac  catheterization today.  Continue aspirin, Plavix, Ranexa No beta-blocker due to cocaine abuse  2.  Hyperlipidemia: Continue atorvastatin  3.  Essential hypertension: Continue Norvasc  4.  Cocaine abuse: Patient counseled against using illicit drugs 5. Tobacco dependence: Patient is encouraged to quit smoking and willing to attempt to quit was assessed. Patient highly motivated.Counseling was provided for 4 minutes.  6.  History of CVA with residual left-sided deficits: Continue aspirin, Plavix and statin   Management plans discussed with the patient and he is in agreement.  CODE STATUS: full  TOTAL TIME TAKING CARE OF THIS PATIENT: 30 minutes.     POSSIBLE D/C tomorrow, DEPENDING ON CLINICAL CONDITION.   Adrian SaranSital Jisselle Poth M.D on 11/15/2018 at 11:59 AM  Between 7am to 6pm - Pager - [phone] After 6pm go to [email or web address] - Social research officer, governmentpassword [email or web address]  [facility]  Office  [phone]  CC: Primary care physician; [facility]  Note: This dictation was prepared with Nurse, children'sDragon dictation along with smaller phrase technology. Any transcriptional errors that result from this process are unintentional.

## 2018-11-15 NOTE — Plan of Care (Signed)
Pt scheduled for cath today, prepped, fluids hung.  Problem: Activity: Goal: Risk for activity intolerance will decrease Outcome: Progressing Note: Up in room independently tolerating well   Problem: Nutrition: Goal: Adequate nutrition will be maintained Outcome: Progressing   Problem: Coping: Goal: Level of anxiety will decrease Outcome: Progressing   Problem: Elimination: Goal: Will not experience complications related to urinary retention Outcome: Progressing   Problem: Pain Managment: Goal: General experience of comfort will improve Outcome: Progressing Note: No complaints of pain this shift   Problem: Safety: Goal: Ability to remain free from injury will improve Outcome: Progressing   Problem: Skin Integrity: Goal: Risk for impaired skin integrity will decrease Outcome: Progressing   Problem: Education: Goal: Knowledge of General Education information will improve Description: Including pain rating scale, medication(s)/side effects and non-pharmacologic comfort measures Outcome: Completed/Met

## 2018-11-16 ENCOUNTER — Ambulatory Visit: Payer: Medicaid Other

## 2018-11-17 ENCOUNTER — Encounter: Payer: Self-pay | Admitting: Internal Medicine

## 2018-11-23 ENCOUNTER — Ambulatory Visit: Payer: Medicaid Other

## 2018-11-28 ENCOUNTER — Encounter: Payer: Medicaid Other | Admitting: Occupational Therapy

## 2018-11-30 ENCOUNTER — Ambulatory Visit: Payer: Medicaid Other

## 2018-11-30 ENCOUNTER — Encounter: Payer: Medicaid Other | Admitting: Occupational Therapy

## 2018-12-05 ENCOUNTER — Encounter: Payer: Medicaid Other | Admitting: Occupational Therapy

## 2018-12-07 ENCOUNTER — Encounter: Payer: Medicaid Other | Admitting: Occupational Therapy

## 2018-12-12 ENCOUNTER — Encounter: Payer: Medicaid Other | Admitting: Occupational Therapy

## 2018-12-14 ENCOUNTER — Encounter: Payer: Medicaid Other | Admitting: Occupational Therapy

## 2018-12-19 ENCOUNTER — Encounter: Payer: Medicaid Other | Admitting: Occupational Therapy

## 2018-12-21 ENCOUNTER — Encounter: Payer: Medicaid Other | Admitting: Occupational Therapy

## 2018-12-26 ENCOUNTER — Encounter: Payer: Medicaid Other | Admitting: Occupational Therapy

## 2018-12-28 ENCOUNTER — Encounter: Payer: Medicaid Other | Admitting: Occupational Therapy

## 2019-01-02 ENCOUNTER — Encounter: Payer: Medicaid Other | Admitting: Occupational Therapy

## 2019-01-04 ENCOUNTER — Encounter: Payer: Medicaid Other | Admitting: Occupational Therapy

## 2019-08-25 ENCOUNTER — Ambulatory Visit: Payer: Medicaid Other | Attending: Internal Medicine

## 2019-08-25 DIAGNOSIS — Z23 Encounter for immunization: Secondary | ICD-10-CM

## 2019-08-25 NOTE — Progress Notes (Signed)
   Covid-19 Vaccination Clinic  Name:  Kenneth Morales    MRN: [id number] DOB: 26-Sep-1965  08/25/2019  [name] was observed post Covid-19 immunization for 15 minutes without incident. He was provided with Vaccine Information Sheet and instruction to access the V-Safe system.   [name] was instructed to call 911 with any severe reactions post vaccine: Marland Kitchen Difficulty breathing  . Swelling of face and throat  . A fast heartbeat  . A bad rash all over body  . Dizziness and weakness   Immunizations Administered    Name Date Dose VIS Date Route   Pfizer COVID-19 Vaccine 08/25/2019 11:03 AM 0.3 mL 06/20/2018 Intramuscular   Manufacturer: [employer], [employer]   Lot: [id number]   NDC: [address]-8

## 2019-09-18 ENCOUNTER — Ambulatory Visit: Payer: Medicaid Other | Attending: Internal Medicine

## 2019-09-18 DIAGNOSIS — Z23 Encounter for immunization: Secondary | ICD-10-CM

## 2019-09-18 NOTE — Progress Notes (Signed)
   Covid-19 Vaccination Clinic  Name:  Kenneth Morales    MRN: [id number] DOB: 1965/08/17  09/18/2019  Kenneth Morales was observed post Covid-19 immunization for 15 minutes without incident. He was provided with Vaccine Information Sheet and instruction to access the V-Safe system.   Kenneth Morales was instructed to call 911 with any severe reactions post vaccine: Marland Kitchen Difficulty breathing  . Swelling of face and throat  . A fast heartbeat  . A bad rash all over body  . Dizziness and weakness   Immunizations Administered    Name Date Dose VIS Date Route   Pfizer COVID-19 Vaccine 09/18/2019  8:35 AM 0.3 mL 06/20/2018 Intramuscular   Manufacturer: [employer], [employer]   Lot: [id number]   NDC: [address]-5

## 2020-06-05 ENCOUNTER — Other Ambulatory Visit: Payer: Self-pay | Admitting: Registered Nurse

## 2020-08-21 ENCOUNTER — Other Ambulatory Visit: Payer: Self-pay | Admitting: Physician Assistant

## 2020-08-21 DIAGNOSIS — R479 Unspecified speech disturbances: Secondary | ICD-10-CM

## 2020-09-03 ENCOUNTER — Ambulatory Visit: Payer: Medicaid Other

## 2020-10-31 ENCOUNTER — Ambulatory Visit: Payer: Medicaid Other

## 2020-11-06 ENCOUNTER — Other Ambulatory Visit: Payer: Self-pay | Admitting: Physician Assistant

## 2020-11-06 DIAGNOSIS — Z8673 Personal history of transient ischemic attack (TIA), and cerebral infarction without residual deficits: Secondary | ICD-10-CM

## 2020-11-06 DIAGNOSIS — R479 Unspecified speech disturbances: Secondary | ICD-10-CM

## 2020-11-06 DIAGNOSIS — R296 Repeated falls: Secondary | ICD-10-CM

## 2020-11-06 DIAGNOSIS — R42 Dizziness and giddiness: Secondary | ICD-10-CM

## 2020-11-06 DIAGNOSIS — R519 Headache, unspecified: Secondary | ICD-10-CM

## 2020-11-06 DIAGNOSIS — R531 Weakness: Secondary | ICD-10-CM

## 2020-11-06 DIAGNOSIS — R200 Anesthesia of skin: Secondary | ICD-10-CM

## 2020-11-07 ENCOUNTER — Ambulatory Visit
Admission: RE | Admit: 2020-11-07 | Discharge: 2020-11-07 | Disposition: A | Discharge status: Home / Self Care | Payer: Medicaid Other | Source: Ambulatory Visit | Attending: Physician Assistant | Admitting: Physician Assistant

## 2020-11-07 ENCOUNTER — Other Ambulatory Visit: Payer: Self-pay

## 2020-11-07 DIAGNOSIS — R531 Weakness: Secondary | ICD-10-CM | POA: Insufficient documentation

## 2020-11-07 DIAGNOSIS — R200 Anesthesia of skin: Secondary | ICD-10-CM | POA: Diagnosis present

## 2020-11-07 DIAGNOSIS — R519 Headache, unspecified: Secondary | ICD-10-CM | POA: Diagnosis present

## 2020-11-07 DIAGNOSIS — R479 Unspecified speech disturbances: Secondary | ICD-10-CM | POA: Insufficient documentation

## 2020-11-07 DIAGNOSIS — Z8673 Personal history of transient ischemic attack (TIA), and cerebral infarction without residual deficits: Secondary | ICD-10-CM | POA: Insufficient documentation

## 2020-11-07 DIAGNOSIS — R296 Repeated falls: Secondary | ICD-10-CM | POA: Insufficient documentation

## 2020-11-07 DIAGNOSIS — R42 Dizziness and giddiness: Secondary | ICD-10-CM | POA: Insufficient documentation

## 2020-11-19 ENCOUNTER — Other Ambulatory Visit: Payer: Self-pay | Admitting: Physician Assistant

## 2020-11-19 DIAGNOSIS — Z8673 Personal history of transient ischemic attack (TIA), and cerebral infarction without residual deficits: Secondary | ICD-10-CM

## 2020-12-02 ENCOUNTER — Ambulatory Visit: Admission: RE | Admit: 2020-12-02 | Payer: Medicaid Other | Source: Ambulatory Visit

## 2021-08-30 ENCOUNTER — Emergency Department: Payer: Medicaid Other

## 2021-08-30 ENCOUNTER — Other Ambulatory Visit: Payer: Self-pay

## 2021-08-30 ENCOUNTER — Emergency Department
Admission: EM | Admit: 2021-08-30 | Discharge: 2021-08-31 | Disposition: A | Discharge status: Home / Self Care | Payer: Medicaid Other | Attending: Emergency Medicine | Admitting: Emergency Medicine

## 2021-08-30 DIAGNOSIS — I10 Essential (primary) hypertension: Secondary | ICD-10-CM | POA: Insufficient documentation

## 2021-08-30 DIAGNOSIS — F17200 Nicotine dependence, unspecified, uncomplicated: Secondary | ICD-10-CM | POA: Insufficient documentation

## 2021-08-30 DIAGNOSIS — Z7982 Long term (current) use of aspirin: Secondary | ICD-10-CM | POA: Insufficient documentation

## 2021-08-30 DIAGNOSIS — R1013 Epigastric pain: Secondary | ICD-10-CM | POA: Insufficient documentation

## 2021-08-30 DIAGNOSIS — Z7902 Long term (current) use of antithrombotics/antiplatelets: Secondary | ICD-10-CM | POA: Diagnosis not present

## 2021-08-30 DIAGNOSIS — Z79899 Other long term (current) drug therapy: Secondary | ICD-10-CM | POA: Insufficient documentation

## 2021-08-30 DIAGNOSIS — I2510 Atherosclerotic heart disease of native coronary artery without angina pectoris: Secondary | ICD-10-CM | POA: Diagnosis not present

## 2021-08-30 DIAGNOSIS — R079 Chest pain, unspecified: Secondary | ICD-10-CM | POA: Diagnosis present

## 2021-08-30 DIAGNOSIS — Z955 Presence of coronary angioplasty implant and graft: Secondary | ICD-10-CM | POA: Insufficient documentation

## 2021-08-30 LAB — CBC
HCT: 45.7 % (ref 39.0–52.0)
Hemoglobin: 15.6 g/dL (ref 13.0–17.0)
MCH: 31.5 pg (ref 26.0–34.0)
MCHC: 34.1 g/dL (ref 30.0–36.0)
MCV: 92.1 fL (ref 80.0–100.0)
Platelets: 306 10*3/uL (ref 150–400)
RBC: 4.96 MIL/uL (ref 4.22–5.81)
RDW: 13.1 % (ref 11.5–15.5)
WBC: 12.2 10*3/uL — ABNORMAL HIGH (ref 4.0–10.5)
nRBC: 0 % (ref 0.0–0.2)

## 2021-08-30 LAB — BASIC METABOLIC PANEL
Anion gap: 7 (ref 5–15)
BUN: 13 mg/dL (ref 6–20)
CO2: 25 mmol/L (ref 22–32)
Calcium: 9 mg/dL (ref 8.9–10.3)
Chloride: 103 mmol/L (ref 98–111)
Creatinine, Ser: 0.99 mg/dL (ref 0.61–1.24)
GFR, Estimated: >60 mL/min (ref >60)
Glucose, Bld: 121 mg/dL — ABNORMAL HIGH (ref 70–99)
Potassium: 3.9 mmol/L (ref 3.5–5.1)
Sodium: 135 mmol/L (ref 135–145)

## 2021-08-30 LAB — HEPATIC FUNCTION PANEL
ALT: 10 U/L (ref 0–44)
AST: 14 U/L — ABNORMAL LOW (ref 15–41)
Albumin: 3.8 g/dL (ref 3.5–5.0)
Alkaline Phosphatase: 82 U/L (ref 38–126)
Bilirubin, Direct: 0.2 mg/dL (ref 0.0–0.2)
Indirect Bilirubin: 1.1 mg/dL — ABNORMAL HIGH (ref 0.3–0.9)
Total Bilirubin: 1.3 mg/dL — ABNORMAL HIGH (ref 0.3–1.2)
Total Protein: 7.2 g/dL (ref 6.5–8.1)

## 2021-08-30 LAB — CK: Total CK: 104 U/L (ref 49–397)

## 2021-08-30 LAB — LIPASE, BLOOD: Lipase: 23 U/L (ref 11–51)

## 2021-08-30 LAB — TROPONIN I (HIGH SENSITIVITY): Troponin I (High Sensitivity): 22 ng/L — ABNORMAL HIGH (ref <18)

## 2021-08-30 MED ORDER — IOHEXOL 350 MG/ML SOLN
75.0000 mL | Freq: Once | INTRAVENOUS | Status: AC | PRN
  Administered 2021-08-30: 75 mL via INTRAVENOUS

## 2021-08-30 MED ORDER — LORAZEPAM 2 MG/ML IJ SOLN
1.0000 mg | Freq: Once | INTRAMUSCULAR | Status: AC
  Administered 2021-08-30: 1 mg via INTRAVENOUS
  Filled 2021-08-30: qty 1

## 2021-08-30 MED ORDER — SODIUM CHLORIDE 0.9 % IV BOLUS
1000.0000 mL | Freq: Once | INTRAVENOUS | Status: AC
  Administered 2021-08-30: 1000 mL via INTRAVENOUS

## 2021-08-30 NOTE — ED Provider Notes (Signed)
? ?[facility] ?Provider Note ? ? ? Event Date/Time  ? First MD Initiated Contact with Patient 08/30/21 2305   ?  (approximate) ? ? ?History  ? ?Chest Pain ? ? ?HPI ? ?Kenneth Morales is a 56 y.o. male brought to the ED via EMS from home with a chief complaint of chest pain which began around 11 AM while at rest.  Patient has a history of cocaine induced CAD status post stents, hypertension, hyperlipidemia, tobacco abuse.  Reports central chest pain which she describes as "pins sticking me" exacerbated by deep breathing.  Denies associated diaphoresis, shortness of breath, nausea/vomiting, palpitations or dizziness.  Last drug use 1 week ago per his report.  Denies recent travel, trauma or hormone use.  Given 324 mg aspirin and nitroglycerin x2 per EMS prior to arrival. ?  ? ? ?Past Medical History  ? ?Past Medical History:  ?Diagnosis Date  ? Acute MI [facility]) MAY 2013  ? CAD (coronary artery disease)   ? NSTEMI in 05/13 in setting of Cocaine use. Cath: 99% mid LCX stenosis with a large thrombus. PCI and BMS (4.0 X15 mm Vision) placement to mid LCX, LAD: 20%, RCA: 30%, EF: 60%.   ? Cocaine abuse (HCC)   ? quit in 08/2011  ? Depression   ? Hyperlipidemia   ? Hypertension   ? Spider bite   ? Tobacco abuse   ? ? ? ?Active Problem List  ? ?Patient Active Problem List  ? Diagnosis Date Noted  ? Ataxia, post-stroke   ? Gait disturbance, post-stroke   ? Intractable hiccoughs   ? 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 Stroke (cerebrum) (HCC) 07/04/2018  ? Unstable angina (HCC) 01/26/2018  ? NSTEMI (non-ST elevated myocardial infarction) (HCC) 11/28/2017  ? 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 Chest pain 07/01/2016  ? SOB (shortness of breath) 12/10/2011  ? CAD (coronary artery disease)   ? Hyperlipidemia   ? Hypertension   ? Tobacco abuse   ? Cocaine abuse (HCC)   ? ? ? ?Past Surgical History  ? ?Past Surgical History:  ?Procedure Laterality Date  ? CARDIAC CATHETERIZATION  MAY 2013  ? s/p  stent @ [facility]  ? CARDIAC CATHETERIZATION    ? CORONARY CTO INTERVENTION N/A 01/27/2018  ? Procedure: CORONARY CTO INTERVENTION;  Surgeon: Corky Crafts, MD;  Location: [facility] INVASIVE CV LAB;  Service: Cardiovascular;  Laterality: N/A;  ? CORONARY STENT INTERVENTION N/A 11/29/2017  ? Procedure: CORONARY STENT INTERVENTION;  Surgeon: Alwyn Pea, MD;  Location: [facility] INVASIVE CV LAB;  Service: Cardiovascular;  Laterality: N/A;  ? CORONARY STENT INTERVENTION N/A 01/27/2018  ? Procedure: CORONARY STENT INTERVENTION;  Surgeon: Corky Crafts, MD;  Location: [facility] INVASIVE CV LAB;  Service: Cardiovascular;  Laterality: N/A;  mid cfx  ? HEMORRHOID SURGERY    ? LEFT HEART CATH AND CORONARY ANGIOGRAPHY Right 11/29/2017  ? Procedure: Left heart catheterization with possible PCI;  Surgeon: Laurier Nancy, MD;  Location: [facility] INVASIVE CV LAB;  Service: Cardiovascular;  Laterality: Right;  ? LEFT HEART CATH AND CORONARY ANGIOGRAPHY N/A 01/27/2018  ? Procedure: LEFT HEART CATH AND CORONARY ANGIOGRAPHY;  Surgeon: Corky Crafts, MD;  Location: [facility] INVASIVE CV LAB;  Service: Cardiovascular;  Laterality: N/A;  ? LEFT HEART CATH AND CORONARY ANGIOGRAPHY N/A 11/15/2018  ? Procedure: LEFT HEART CATH AND CORONARY ANGIOGRAPHY and possible pci and stent;  Surgeon: Alwyn Pea, MD;  Location: [facility] INVASIVE CV LAB;  Service: Cardiovascular;  Laterality:  N/A;  ? ? ? ?Home Medications  ? ?Prior to Admission medications   ?Medication Sig Start Date End Date Taking? Authorizing Provider  ?acetaminophen (TYLENOL) 325 MG tablet Take 2 tablets (650 mg total) by mouth every 4 (four) hours as needed for mild pain (or temp > 37.5 C (99.5 F)). 07/14/18   Angiulli, Mcarthur Rossetti, PA-C  ?amLODipine (NORVASC) 5 MG tablet Take 1 tablet (5 mg total) by mouth daily. 07/14/18   Angiulli, Mcarthur Rossetti, PA-C  ?aspirin 81 MG tablet Take 1 tablet (81 mg total) by mouth daily. 07/18/18   Jones Bales, NP  ?atorvastatin (LIPITOR) 80 MG tablet Take 1  tablet (80 mg total) by mouth daily at 6 PM. 07/14/18   Angiulli, Mcarthur Rossetti, PA-C  ?clopidogrel (PLAVIX) 75 MG tablet Take 1 tablet (75 mg total) by mouth daily. 07/14/18   Angiulli, Mcarthur Rossetti, PA-C  ?famotidine (PEPCID) 20 MG tablet Take 1 tablet (20 mg total) by mouth 2 (two) times daily. 07/18/18   Jones Bales, NP  ?nitroGLYCERIN (NITROSTAT) 0.4 MG SL tablet Place 1 tablet (0.4mg  total) under the tongue every 5 minutes as needed for chest pain. May take 3 doses. 07/18/18   Jones Bales, NP  ?nortriptyline (PAMELOR) 10 MG capsule Take 20 mg by mouth at bedtime.     [provider]  ?polyethylene glycol (MIRALAX / GLYCOLAX) packet Take 17 g by mouth daily. 07/08/18   Enid Baas Jude, MD  ?ranolazine (RANEXA) 500 MG 12 hr tablet Take 1 tablet (500 mg total) by mouth 2 (two) times daily. 07/14/18   Angiulli, Mcarthur Rossetti, PA-C  ? ? ? ?Allergies  ?Brilinta [ticagrelor] and Penicillins ? ? ?Family History  ? ?Family History  ?Problem Relation Age of Onset  ? Heart attack Maternal Grandfather   ? Heart attack Paternal Grandfather   ? ? ? ?Physical Exam  ?Triage Vital Signs: ?ED Triage Vitals  ?Enc Vitals Group  ?   BP 08/30/21 2257 140/88  ?   Pulse Rate 08/30/21 2257 87  ?   Resp 08/30/21 2257 17  ?   Temp 08/30/21 2257 99 ?F (37.2 ?C)  ?   Temp Source 08/30/21 2257 Oral  ?   SpO2 08/30/21 2257 95 %  ?   Weight 08/30/21 2258 170 lb (77.1 kg)  ?   Height 08/30/21 2258 5\' 10"  (1.778 m)  ?   Head Circumference --   ?   Peak Flow --   ?   Pain Score 08/30/21 2257 9  ?   Pain Loc --   ?   Pain Edu? --   ?   Excl. in GC? --   ? ? ?Updated Vital Signs: ?BP (!) 143/94   Pulse 79   Temp 99 ?F (37.2 ?C) (Oral)   Resp 13   Ht 5\' 10"  (1.778 m)   Wt 77.1 kg   SpO2 97%   BMI 24.39 kg/m?  ? ? ?General: Awake, no distress.  ?CV:  RRR good peripheral perfusion.  ?Resp:  Normal effort.  CTA B.  Central chest tender to palpation. ?Abd:  Mild epigastric tenderness to palpation without rebound or guarding no distention.   ?Other:  Calves are nonswollen and nontender to palpation. ? ? ?ED Results / Procedures / Treatments  ?Labs ?(all labs ordered are listed, but only abnormal results are displayed) ?Labs Reviewed  ?BASIC METABOLIC PANEL - Abnormal; Notable for the following components:  ?    Result Value  ? Glucose,  Bld 121 (*)   ? All other components within normal limits  ?CBC - Abnormal; Notable for the following components:  ? WBC 12.2 (*)   ? All other components within normal limits  ?HEPATIC FUNCTION PANEL - Abnormal; Notable for the following components:  ? AST 14 (*)   ? Total Bilirubin 1.3 (*)   ? Indirect Bilirubin 1.1 (*)   ? All other components within normal limits  ?TROPONIN I (HIGH SENSITIVITY) - Abnormal; Notable for the following components:  ? Troponin I (High Sensitivity) 22 (*)   ? All other components within normal limits  ?TROPONIN I (HIGH SENSITIVITY) - Abnormal; Notable for the following components:  ? Troponin I (High Sensitivity) 23 (*)   ? All other components within normal limits  ?LIPASE, BLOOD  ?CK  ?URINE DRUG SCREEN, QUALITATIVE ([facility] ONLY)  ?ETHANOL  ? ? ? ?EKG ? ?ED ECG REPORT ?I, Irean HongSUNG,Pratyush Ammon J, the attending physician, personally viewed and interpreted this ECG. ? ? Date: 08/30/2021 ? EKG Time: 2257 ? Rate: 86 ? Rhythm: normal sinus rhythm ? Axis: Normal ? Intervals:left anterior fascicular block ? ST&T Change: Nonspecific ?No significant change from 11/16/2018 ? ? ?RADIOLOGY ?I have independently visualized and interpreted patient's chest x-ray and CTA chest as well as noted the radiology interpretation: ? ?Chest x-ray: No acute cardiopulmonary process ? ?CTA chest: No PE ? ?Official radiology report(s): ?CT Angio Chest PE W/Cm &/Or Wo Cm ? ?Result Date: 08/31/2021 ?CLINICAL DATA:  Chest pain. EXAM: CT ANGIOGRAPHY CHEST WITH CONTRAST TECHNIQUE: Multidetector CT imaging of the chest was performed using the standard protocol during bolus administration of intravenous contrast. Multiplanar CT image  reconstructions and MIPs were obtained to evaluate the vascular anatomy. RADIATION DOSE REDUCTION: This exam was performed according to the departmental dose-optimization program which includes automated exposure control, adjustm

## 2021-08-30 NOTE — ED Notes (Signed)
Pt given a warm blanket 

## 2021-08-30 NOTE — ED Triage Notes (Signed)
Pt arrives via [facility] with CC of CP that began today. ?EMS gave nitro x2 and 324 aspirin. ? ?EMS reports CBG 137. ?EMS VS: ?HR 80 ?Oral temp 99.3 ?SpO2 96% RA ?Initial BP 180/100 ?After interventions 150/90 ?18R AC ?

## 2021-08-31 LAB — TROPONIN I (HIGH SENSITIVITY): Troponin I (High Sensitivity): 23 ng/L — ABNORMAL HIGH (ref <18)

## 2021-08-31 MED ORDER — MORPHINE SULFATE (PF) 4 MG/ML IV SOLN
4.0000 mg | Freq: Once | INTRAVENOUS | Status: DC
  Filled 2021-08-31: qty 1

## 2021-08-31 NOTE — Discharge Instructions (Signed)
Take your medicines daily as directed by your doctor.  Do not use illicit drugs.  Return to the ER for worsening symptoms, persistent vomiting, difficulty breathing or other concerns. ?

## 2021-08-31 NOTE — ED Notes (Signed)
Requested for assistance to discharge by primary nurse, Dorian and charge nurse Megan. Pt alert and oriented X 4. Pt left in hospital gown due to having wet clothing. Left with all of belongings including discharge papers that were reviewed with him by this RN. Pt cursing and talking loudly with aggression at this RN. Refused repeat VS. Able to ambulate appropriately and safely. Able to articulate and communicate curse words without any difficulties. Walked pt to lobby and showed where hospital provided phone is located. At that point, pt yells at RN to "leave me alone". Security aware. ?

## 2023-02-03 ENCOUNTER — Emergency Department: Payer: Medicaid Other

## 2023-02-03 ENCOUNTER — Observation Stay
Admission: EM | Admit: 2023-02-03 | Discharge: 2023-02-04 | Disposition: A | Discharge status: Home / Self Care | Payer: Medicaid Other | Attending: Internal Medicine | Admitting: Internal Medicine

## 2023-02-03 ENCOUNTER — Other Ambulatory Visit: Payer: Self-pay

## 2023-02-03 ENCOUNTER — Inpatient Hospital Stay: Payer: Medicaid Other

## 2023-02-03 DIAGNOSIS — F1410 Cocaine abuse, uncomplicated: Secondary | ICD-10-CM | POA: Diagnosis not present

## 2023-02-03 DIAGNOSIS — I639 Cerebral infarction, unspecified: Secondary | ICD-10-CM | POA: Diagnosis not present

## 2023-02-03 DIAGNOSIS — Z720 Tobacco use: Secondary | ICD-10-CM

## 2023-02-03 DIAGNOSIS — I2510 Atherosclerotic heart disease of native coronary artery without angina pectoris: Secondary | ICD-10-CM

## 2023-02-03 DIAGNOSIS — Z7982 Long term (current) use of aspirin: Secondary | ICD-10-CM | POA: Diagnosis not present

## 2023-02-03 DIAGNOSIS — E785 Hyperlipidemia, unspecified: Secondary | ICD-10-CM | POA: Diagnosis not present

## 2023-02-03 DIAGNOSIS — Z955 Presence of coronary angioplasty implant and graft: Secondary | ICD-10-CM | POA: Insufficient documentation

## 2023-02-03 DIAGNOSIS — I10 Essential (primary) hypertension: Secondary | ICD-10-CM

## 2023-02-03 DIAGNOSIS — R531 Weakness: Secondary | ICD-10-CM | POA: Diagnosis present

## 2023-02-03 DIAGNOSIS — Z7902 Long term (current) use of antithrombotics/antiplatelets: Secondary | ICD-10-CM | POA: Diagnosis not present

## 2023-02-03 DIAGNOSIS — F1411 Cocaine abuse, in remission: Secondary | ICD-10-CM

## 2023-02-03 DIAGNOSIS — F17210 Nicotine dependence, cigarettes, uncomplicated: Secondary | ICD-10-CM | POA: Insufficient documentation

## 2023-02-03 DIAGNOSIS — Z79899 Other long term (current) drug therapy: Secondary | ICD-10-CM | POA: Diagnosis not present

## 2023-02-03 DIAGNOSIS — Z91199 Patient's noncompliance with other medical treatment and regimen due to unspecified reason: Secondary | ICD-10-CM

## 2023-02-03 LAB — DIFFERENTIAL
Abs Immature Granulocytes: 0.02 10*3/uL (ref 0.00–0.07)
Basophils Absolute: 0.1 10*3/uL (ref 0.0–0.1)
Basophils Relative: 1 %
Eosinophils Absolute: 0.2 10*3/uL (ref 0.0–0.5)
Eosinophils Relative: 2 %
Immature Granulocytes: 0 %
Lymphocytes Relative: 31 %
Lymphs Abs: 2.9 10*3/uL (ref 0.7–4.0)
Monocytes Absolute: 0.7 10*3/uL (ref 0.1–1.0)
Monocytes Relative: 8 %
Neutro Abs: 5.4 10*3/uL (ref 1.7–7.7)
Neutrophils Relative %: 58 %

## 2023-02-03 LAB — COMPREHENSIVE METABOLIC PANEL
ALT: 8 U/L (ref 0–44)
AST: 20 U/L (ref 15–41)
Albumin: 4.1 g/dL (ref 3.5–5.0)
Alkaline Phosphatase: 94 U/L (ref 38–126)
Anion gap: 9 (ref 5–15)
BUN: 16 mg/dL (ref 6–20)
CO2: 27 mmol/L (ref 22–32)
Calcium: 8.9 mg/dL (ref 8.9–10.3)
Chloride: 101 mmol/L (ref 98–111)
Creatinine, Ser: 1.07 mg/dL (ref 0.61–1.24)
GFR, Estimated: >60 mL/min (ref >60)
Glucose, Bld: 80 mg/dL (ref 70–99)
Potassium: 4 mmol/L (ref 3.5–5.1)
Sodium: 137 mmol/L (ref 135–145)
Total Bilirubin: 1.6 mg/dL — ABNORMAL HIGH (ref 0.3–1.2)
Total Protein: 7.7 g/dL (ref 6.5–8.1)

## 2023-02-03 LAB — PROTIME-INR
INR: 1 (ref 0.8–1.2)
Prothrombin Time: 12.9 s (ref 11.4–15.2)

## 2023-02-03 LAB — CBC
HCT: 48.4 % (ref 39.0–52.0)
Hemoglobin: 16.3 g/dL (ref 13.0–17.0)
MCH: 30.9 pg (ref 26.0–34.0)
MCHC: 33.7 g/dL (ref 30.0–36.0)
MCV: 91.8 fL (ref 80.0–100.0)
Platelets: 275 10*3/uL (ref 150–400)
RBC: 5.27 MIL/uL (ref 4.22–5.81)
RDW: 13.8 % (ref 11.5–15.5)
WBC: 9.3 10*3/uL (ref 4.0–10.5)
nRBC: 0 % (ref 0.0–0.2)

## 2023-02-03 LAB — HEMOGLOBIN A1C
Hgb A1c MFr Bld: 5.5 % (ref 4.8–5.6)
Mean Plasma Glucose: 111.15 mg/dL

## 2023-02-03 LAB — APTT: aPTT: 31 s (ref 24–36)

## 2023-02-03 LAB — ETHANOL: Alcohol, Ethyl (B): <10 mg/dL (ref <10)

## 2023-02-03 MED ORDER — ASPIRIN 81 MG PO TBEC
81.0000 mg | DELAYED_RELEASE_TABLET | Freq: Every day | ORAL | Status: DC
  Administered 2023-02-04: 81 mg via ORAL
  Filled 2023-02-03 (×2): qty 1

## 2023-02-03 MED ORDER — ENSURE ENLIVE PO LIQD
237.0000 mL | Freq: Two times a day (BID) | ORAL | Status: DC
  Administered 2023-02-04: 237 mL via ORAL

## 2023-02-03 MED ORDER — CLOPIDOGREL BISULFATE 75 MG PO TABS
75.0000 mg | ORAL_TABLET | Freq: Every day | ORAL | Status: DC
  Administered 2023-02-03 – 2023-02-04 (×2): 75 mg via ORAL
  Filled 2023-02-03 (×2): qty 1

## 2023-02-03 MED ORDER — HYDRALAZINE HCL 20 MG/ML IJ SOLN: 10.0000 mg | INTRAMUSCULAR | Status: DC | PRN

## 2023-02-03 MED ORDER — IOHEXOL 350 MG/ML SOLN
75.0000 mL | Freq: Once | INTRAVENOUS | Status: AC | PRN
  Administered 2023-02-03: 75 mL via INTRAVENOUS

## 2023-02-03 MED ORDER — NITROGLYCERIN 0.4 MG SL SUBL: 0.4000 mg | SUBLINGUAL_TABLET | SUBLINGUAL | Status: DC | PRN

## 2023-02-03 MED ORDER — STROKE: EARLY STAGES OF RECOVERY BOOK: Freq: Once | Status: AC

## 2023-02-03 MED ORDER — ASPIRIN 81 MG PO CHEW
324.0000 mg | CHEWABLE_TABLET | Freq: Once | ORAL | Status: AC
  Administered 2023-02-03: 324 mg via ORAL
  Filled 2023-02-03: qty 4

## 2023-02-03 MED ORDER — AMLODIPINE BESYLATE 10 MG PO TABS
10.0000 mg | ORAL_TABLET | Freq: Every day | ORAL | Status: DC
  Administered 2023-02-03 – 2023-02-04 (×2): 10 mg via ORAL
  Filled 2023-02-03: qty 1
  Filled 2023-02-03: qty 2

## 2023-02-03 MED ORDER — NICOTINE 14 MG/24HR TD PT24
14.0000 mg | MEDICATED_PATCH | Freq: Every day | TRANSDERMAL | Status: DC
  Administered 2023-02-03 – 2023-02-04 (×2): 14 mg via TRANSDERMAL
  Filled 2023-02-03 (×2): qty 1

## 2023-02-03 MED ORDER — AMLODIPINE BESYLATE 5 MG PO TABS
5.0000 mg | ORAL_TABLET | Freq: Once | ORAL | Status: AC
  Administered 2023-02-03: 5 mg via ORAL
  Filled 2023-02-03: qty 1

## 2023-02-03 MED ORDER — ACETAMINOPHEN 325 MG PO TABS: 650.0000 mg | ORAL_TABLET | ORAL | Status: DC | PRN

## 2023-02-03 MED ORDER — ATORVASTATIN CALCIUM 20 MG PO TABS
80.0000 mg | ORAL_TABLET | Freq: Every day | ORAL | Status: DC
  Administered 2023-02-03: 80 mg via ORAL
  Filled 2023-02-03: qty 4

## 2023-02-03 MED ORDER — ENOXAPARIN SODIUM 40 MG/0.4ML IJ SOSY
40.0000 mg | PREFILLED_SYRINGE | INTRAMUSCULAR | Status: DC
  Administered 2023-02-03: 40 mg via SUBCUTANEOUS
  Filled 2023-02-03: qty 0.4

## 2023-02-03 NOTE — Consult Note (Signed)
NEUROLOGY CONSULT NOTE   Date of service: February 03, 2023 Patient Name: Kenneth Morales MRN:  [id number] DOB:  [birth year] Chief Complaint: "Weakness" Requesting Provider: Floydene Flock, MD  History of Present Illness  Kenneth Morales is a 57 y.o. male  has a past medical history of Acute MI (HCC) (MAY 2013), CAD (coronary artery disease), Cocaine abuse (HCC), Depression, Hyperlipidemia, Hypertension, Spider bite, and Tobacco abuse. who presents with  complaint of progressive weakness and falls.  Patient has had multiple strokes in the past.  Since December has been progressively declining, becoming more weak and having falls.  Has seen neurology and cardiology in the past.  Has not been compliant with appointments or medications.  Was attempting to reinitiate care and was sent to the ED for further evaluation before being allowed to make an appointment.    LKW: Unclear Modified rankin score: 1-No significant post stroke disability and can perform usual duties with stroke symptoms IV Thrombolysis:  No, outside time window EVT: No, outside time window   ROS  Comprehensive ROS performed and pertinent positives documented in HPI   Past History   Past Medical History:  Diagnosis Date   Acute MI (HCC) MAY 2013   CAD (coronary artery disease)    NSTEMI in 05/13 in setting of Cocaine use. Cath: 99% mid LCX stenosis with a large thrombus. PCI and BMS (4.0 X15 mm Vision) placement to mid LCX, LAD: 20%, RCA: 30%, EF: 60%.    Cocaine abuse (HCC)    quit in 08/2011   Depression    Hyperlipidemia    Hypertension    Spider bite    Tobacco abuse     Past Surgical History:  Procedure Laterality Date   CARDIAC CATHETERIZATION  MAY 2013   s/p stent @ [facility]   CARDIAC CATHETERIZATION     CORONARY CTO INTERVENTION N/A 01/27/2018   Procedure: CORONARY CTO INTERVENTION;  Surgeon: Corky Crafts, MD;  Location: [facility] INVASIVE CV LAB;  Service: Cardiovascular;  Laterality: N/A;   CORONARY STENT  INTERVENTION N/A 11/29/2017   Procedure: CORONARY STENT INTERVENTION;  Surgeon: Alwyn Pea, MD;  Location: [facility] INVASIVE CV LAB;  Service: Cardiovascular;  Laterality: N/A;   CORONARY STENT INTERVENTION N/A 01/27/2018   Procedure: CORONARY STENT INTERVENTION;  Surgeon: Corky Crafts, MD;  Location: [facility] INVASIVE CV LAB;  Service: Cardiovascular;  Laterality: N/A;  mid cfx   HEMORRHOID SURGERY     LEFT HEART CATH AND CORONARY ANGIOGRAPHY Right 11/29/2017   Procedure: Left heart catheterization with possible PCI;  Surgeon: Laurier Nancy, MD;  Location: [facility] INVASIVE CV LAB;  Service: Cardiovascular;  Laterality: Right;   LEFT HEART CATH AND CORONARY ANGIOGRAPHY N/A 01/27/2018   Procedure: LEFT HEART CATH AND CORONARY ANGIOGRAPHY;  Surgeon: Corky Crafts, MD;  Location: [facility] INVASIVE CV LAB;  Service: Cardiovascular;  Laterality: N/A;   LEFT HEART CATH AND CORONARY ANGIOGRAPHY N/A 11/15/2018   Procedure: LEFT HEART CATH AND CORONARY ANGIOGRAPHY and possible pci and stent;  Surgeon: Alwyn Pea, MD;  Location: [facility] INVASIVE CV LAB;  Service: Cardiovascular;  Laterality: N/A;    Family History: Family History  Problem Relation Age of Onset   Heart attack Maternal Grandfather    Heart attack Paternal Grandfather     Social History  reports that he has been smoking cigarettes. He has a 15 pack-year smoking history. He has never used smokeless tobacco. He reports current alcohol use of about 6.0 standard drinks of alcohol per  week. He reports that he does not use drugs.  Allergies  Allergen Reactions   Brilinta [Ticagrelor] Shortness Of Breath   Penicillins Rash    Has patient had a PCN reaction causing immediate rash, facial/tongue/throat swelling, SOB or lightheadedness with hypotension: Yes Has patient had a PCN reaction causing severe rash involving mucus membranes or skin necrosis: No Has patient had a PCN reaction that required hospitalization: No Has patient had a PCN  reaction occurring within the last 10 years: No If all of the above answers are "NO", then may proceed with Cephalosporin use.    Medications   Current Facility-Administered Medications:    [START ON 02/04/2023]  stroke: early stages of recovery book, , Does not apply, Once, Floydene Flock, MD   acetaminophen (TYLENOL) tablet 650 mg, 650 mg, Oral, Q4H PRN, Floydene Flock, MD   amLODipine (NORVASC) tablet 10 mg, 10 mg, Oral, Daily, Floydene Flock, MD   aspirin EC tablet 81 mg, 81 mg, Oral, Daily, Floydene Flock, MD   atorvastatin (LIPITOR) tablet 80 mg, 80 mg, Oral, q1800, Floydene Flock, MD   clopidogrel (PLAVIX) tablet 75 mg, 75 mg, Oral, Daily, Floydene Flock, MD   nicotine (NICODERM CQ - dosed in mg/24 hours) patch 14 mg, 14 mg, Transdermal, Daily, Floydene Flock, MD   nitroGLYCERIN (NITROSTAT) SL tablet 0.4 mg, 0.4 mg, Sublingual, Q5 min PRN, Floydene Flock, MD  Current Outpatient Medications:    acetaminophen (TYLENOL) 325 MG tablet, Take 2 tablets (650 mg total) by mouth every 4 (four) hours as needed for mild pain (or temp > 37.5 C (99.5 F))., Disp: , Rfl:    famotidine (PEPCID) 20 MG tablet, Take 1 tablet (20 mg total) by mouth 2 (two) times daily., Disp: 60 tablet, Rfl: 1   nitroGLYCERIN (NITROSTAT) 0.4 MG SL tablet, Place 1 tablet (0.4mg  total) under the tongue every 5 minutes as needed for chest pain. May take 3 doses., Disp: 25 tablet, Rfl: 1   polyethylene glycol (MIRALAX / GLYCOLAX) packet, Take 17 g by mouth daily., Disp: 14 each, Rfl: 0   amLODipine (NORVASC) 10 MG tablet, Take 10 mg by mouth daily., Disp: , Rfl:    amLODipine (NORVASC) 5 MG tablet, Take 1 tablet (5 mg total) by mouth daily. (Patient not taking: Reported on 02/03/2023), Disp: 30 tablet, Rfl: 5   aspirin 81 MG tablet, Take 1 tablet (81 mg total) by mouth daily., Disp: 30 tablet, Rfl: 2   atorvastatin (LIPITOR) 80 MG tablet, Take 1 tablet (80 mg total) by mouth daily at 6 PM., Disp: 30 tablet,  Rfl: 5   clopidogrel (PLAVIX) 75 MG tablet, Take 1 tablet (75 mg total) by mouth daily., Disp: 30 tablet, Rfl: 0   metoprolol succinate (TOPROL-XL) 25 MG 24 hr tablet, Take 25 mg by mouth daily., Disp: , Rfl:    nortriptyline (PAMELOR) 10 MG capsule, Take 20 mg by mouth at bedtime.  (Patient not taking: Reported on 02/03/2023), Disp: , Rfl:    ranolazine (RANEXA) 500 MG 12 hr tablet, Take 1 tablet (500 mg total) by mouth 2 (two) times daily. (Patient not taking: Reported on 02/03/2023), Disp: 60 tablet, Rfl: 0  Vitals   Vitals:   02/03/23 0820 02/03/23 0826  BP:  (!) 207/116  Pulse:  76  Resp:  14  Temp:  98 F (36.7 C)  TempSrc:  Oral  SpO2:  98%  Weight: 72.6 kg   Height: 5\' 10"  (1.778 m)  Body mass index is 22.96 kg/m.  Physical Exam   Constitutional: Appears well-developed and well-nourished.  Psych: Affect appropriate to situation.  Eyes: No scleral injection.  Head: Normocephalic.  Cardiovascular: Normal rate and regular rhythm.  Respiratory: Effort normal, non-labored breathing.    Neurologic Examination   Mental Status: Alert, oriented, thought content appropriate.  Speech fluent without evidence of aphasia.  Able to follow 3 step commands without difficulty. Cranial Nerves: II: Discs flat bilaterally; Visual fields grossly normal, pupils equal, round, reactive to light and accommodation III,IV, VI: ptosis not present, extra-ocular motions intact bilaterally V,VII: decrease in right NLF, facial light touch sensation normal bilaterally VIII: hearing normal bilaterally IX,X: gag reflex present XI: bilateral shoulder shrug XII: midline tongue extension Motor: 5/5 throughout Sensory: Pinprick and light touch intact throughout, bilaterally Deep Tendon Reflexes: Symmetric throughout Plantars: Right: mute   Left: mute Cerebellar: normal finger-to-nose and normal heel-to-shin testing bilaterally Gait: not tested due to safety concerns  Labs   CBC:  Recent  Labs  Lab 02/03/23 0924  WBC 9.3  NEUTROABS 5.4  HGB 16.3  HCT 48.4  MCV 91.8  PLT 275    Basic Metabolic Panel:  Lab Results  Component Value Date   NA 137 02/03/2023   K 4.0 02/03/2023   CO2 27 02/03/2023   GLUCOSE 80 02/03/2023   BUN 16 02/03/2023   CREATININE 1.07 02/03/2023   CALCIUM 8.9 02/03/2023   GFRNONAA >60 02/03/2023   GFRAA >60 11/14/2018   Lipid Panel:  Lab Results  Component Value Date   LDLCALC UNABLE TO CALCULATE IF TRIGLYCERIDE OVER 400 mg/dL 16/01/9603   [id number]:  Lab Results  Component Value Date   HGBA1C 5.7 (H) 11/13/2018   Urine Drug Screen:     Component Value Date/Time   LABOPIA NONE DETECTED 11/14/2018 0038   LABOPIA POSITIVE (A) 01/27/2018 1531   COCAINSCRNUR POSITIVE (A) 11/14/2018 0038   LABBENZ NONE DETECTED 11/14/2018 0038   LABBENZ NONE DETECTED 01/27/2018 1531   AMPHETMU NONE DETECTED 11/14/2018 0038   AMPHETMU NONE DETECTED 01/27/2018 1531   THCU NONE DETECTED 11/14/2018 0038   THCU NONE DETECTED 01/27/2018 1531   LABBARB NONE DETECTED 11/14/2018 0038   LABBARB NONE DETECTED 01/27/2018 1531    Alcohol Level     Component Value Date/Time   ETH <10 02/03/2023 0924   INR  Lab Results  Component Value Date   INR 1.0 02/03/2023   APTT  Lab Results  Component Value Date   APTT 31 02/03/2023   AED levels: No results found for: "PHENYTOIN", "ZONISAMIDE", "LAMOTRIGINE", "LEVETIRACETA"  MRI Brain(Personally reviewed): MRI HEAD WITHOUT CONTRAST   TECHNIQUE: Multiplanar, multiecho pulse sequences of the brain and surrounding structures were obtained without intravenous contrast.   COMPARISON:  Brain MRI 11/07/2020.   FINDINGS: Brain: Progressive post ischemic appearing encephalomalacia in the posterior right MCA territory, evolution of cortical encephalomalacia in the right parietal lobe including the sensory strip on series 9, image 41. Encephalomalacia tracks from their toward the right occipital pole. Patchy  associated mild hemosiderin. Chronic lacunar infarcts scattered in the bilateral deep gray nuclei, and the right corona radiata and also appear progressed since 2022. Multiple chronic lacunar infarcts in the pons are new since 2022. And there is evidence of developing Wallerian degeneration in the right cerebellar peduncle. A chronic lacunar infarct of the right medulla on series 8, image 5 is also increased since the prior.   Multiple small chronic lacunar infarcts in the bilateral cerebellum.   And  there is a superimposed small subcentimeter acute infarct in the posterior left cerebellum with restricted diffusion on series 5, image 13. Faint T2 and FLAIR hyperintensity. No hemorrhage or mass effect.   No other restricted diffusion. No midline shift, mass effect, evidence of mass lesion, ventriculomegaly, extra-axial collection or acute intracranial hemorrhage. Cervicomedullary junction and pituitary are within normal limits.   Vascular: Major intracranial vascular flow voids are stable. Generalized intracranial artery tortuosity.   Skull and upper cervical spine: Negative for age visible upper cervical spine. Normal background bone marrow signal.   Sinuses/Orbits: Stable, negative.   Other: Mastoids remain clear. Visible internal auditory structures appear normal. Negative visible scalp and face.   IMPRESSION: 1. Acute on severe chronic small vessel disease, in the form of a tiny linear acute infarct in the posterior left cerebellum. No associated hemorrhage or mass effect.   2. Advanced chronic small vessel ischemia appears significantly progressed since the 2022 MRI in the bilateral deep gray nuclei and brainstem. And posterior right MCA territory ischemic encephalomalacia has progressed since that time    Impression   SULO JANCZAK is a 57 y.o. male with a past medical history of Acute MI (HCC) (MAY 2013), CAD (coronary artery disease), Cocaine abuse (HCC),  Depression, Hyperlipidemia, Hypertension, Spider bite, and Tobacco abuse. who presents with  complaint of progressive weakness and falls.  Patient has had multiple strokes in the past.  Since December has been progressively declining, becoming more weak and having falls.  Has seen neurology and cardiology in the past.  Has not been compliant with appointments or medications.  Has significant history of vascular disease.  Has reported some palpitations in the past as well.  MRI of the brain personally reviewed and reveals extensive small vessel disease and an acute small left posterior cerebellar infarct.  Suspect patient has been having infarcts all along.  Although with multiple poorly controlled vascular risk factors can not rule out embolic etiology as well.   Recommendations  1. HgbA1c, fasting lipid panel 2. Smoking cessation counseling.  No longer using illicit drugs 3. PT consult, OT consult, Speech consult 4. Echocardiogram with bubble study 5. Prophylactic therapy-Dual antiplatelet therapy with ASA 81mg  and Plavix 75mg  for three weeks with change to ASA alone as monotherapy after that time.  6. NPO until RN stroke swallow screen 7. Telemetry monitoring.  Patient to have holter monitor scheduled on an outpatient basis if inpatient monitoring is unremarkable.   8. Frequent neuro checks  9. Patient to reinstitute care with neurology and cardiology after discharge.   ______________________________________________________________________    Signed,  Thana Farr

## 2023-02-03 NOTE — ED Provider Notes (Signed)
[facility] Provider Note    Event Date/Time   First MD Initiated Contact with Patient 02/03/23 [phone]     (approximate)   History   Weakness   HPI  Kenneth Morales is a 57 y.o. male with a history of coronary disease, hypertension.  Advises he has prescriptions for his medications but frequently does not take them.  For instance over the last 2 or more days he has not taken his blood pressure medicine Norvasc, but advises he has not run out   Per cardiology in march "Kenneth Morales is a 57 y.o.male patient who presents for a follow up for hypertension, patient thinks he had a stroke, BP was 149/100 per Kenneth Calamity, PA, was a former patient of Dr. Lady Morales. PMH significant for coronary artery disease s/p NSTEMI with multiple stents, history of cocaine abuse, a CVA, tobacco dependence, hypertension, and hyperlipidemia. "  Patient here as he has been having several months of difficulty walking and difficulty speaking.  This is not entirely new he reports he had a stroke in the distant past, but since December when his family member was hospitalized, he had a day where he noticed his speech did not quite seem as good, he started having difficulty playing instruments, and he has a little bit of a wobbliness when he walks.  His caretaker and family has eventually convinced him he needed to follow-up with a doctor.  He called the cardiology clinic and they could not see him and advised he should come to the ER.  He reports he just wanted to make a routine visit with his doctor to get checked out, and both he and his caretaker advise he has not had any sudden change in any of his symptoms since about December.  No chest pain no shortness of breath no nausea or vomiting.  No headaches or recent illness  Physical Exam   Triage Vital Signs: ED Triage Vitals  Encounter Vitals Group     BP 02/03/23 0826 (!) 207/116     Systolic BP Percentile --      Diastolic BP Percentile --       Pulse Rate 02/03/23 0826 76     Resp 02/03/23 0826 14     Temp 02/03/23 0826 98 F (36.7 C)     Temp Source 02/03/23 0826 Oral     SpO2 02/03/23 0826 98 %     Weight 02/03/23 0820 160 lb (72.6 kg)     Height 02/03/23 0820 5\' 10"  (1.778 m)     Head Circumference --      Peak Flow --      Pain Score 02/03/23 0820 0     Pain Loc --      Pain Education --      Exclude from Growth Chart --     Most recent vital signs: Vitals:   02/03/23 0826  BP: (!) 207/116  Pulse: 76  Resp: 14  Temp: 98 F (36.7 C)  SpO2: 98%     General: Awake, no distress.  Pleasant.  Speech just slightly slurred or thick, present since at least December according to patient and family CV:  Good peripheral perfusion.  Normal tones and rate Resp:  Normal effort.  Clear bilateral Abd:  No distention.  Other:  No focal deficits.  Moves all extremities well.  Able to transfer from wheelchair to bed.  He does seem like he has some slight dysmetria, and he reports that that has  been present now for several months.  There is no facial droop.  Extraocular movements are normal.  Speech with just a slight dysarthria detected   ED Results / Procedures / Treatments   Labs (all labs ordered are listed, but only abnormal results are displayed) Labs Reviewed  COMPREHENSIVE METABOLIC PANEL - Abnormal; Notable for the following components:      Result Value   Total Bilirubin 1.6 (*)    All other components within normal limits  PROTIME-INR  APTT  CBC  DIFFERENTIAL  ETHANOL  CBG MONITORING, ED     EKG  And interpreted by me at 930 heart rate 75 QRS 110 QTc 440 Normal sinus rhythm, left ventricular hypertrophy, biphasic T waves lateral precordial.  Compared with previous EKG from May of last year, no significant changes found.  Of note the patient has no chest pain   RADIOLOGY  MR BRAIN WO CONTRAST  Result Date: 02/03/2023 CLINICAL DATA:  57 year old male with stroke-like symptoms. Left side weakness  and slurred speech. EXAM: MRI HEAD WITHOUT CONTRAST TECHNIQUE: Multiplanar, multiecho pulse sequences of the brain and surrounding structures were obtained without intravenous contrast. COMPARISON:  Brain MRI 11/07/2020. FINDINGS: Brain: Progressive post ischemic appearing encephalomalacia in the posterior right MCA territory, evolution of cortical encephalomalacia in the right parietal lobe including the sensory strip on series 9, image 41. Encephalomalacia tracks from their toward the right occipital pole. Patchy associated mild hemosiderin. Chronic lacunar infarcts scattered in the bilateral deep gray nuclei, and the right corona radiata and also appear progressed since 2022. Multiple chronic lacunar infarcts in the pons are new since 2022. And there is evidence of developing Wallerian degeneration in the right cerebellar peduncle. A chronic lacunar infarct of the right medulla on series 8, image 5 is also increased since the prior. Multiple small chronic lacunar infarcts in the bilateral cerebellum. And there is a superimposed small subcentimeter acute infarct in the posterior left cerebellum with restricted diffusion on series 5, image 13. Faint T2 and FLAIR hyperintensity. No hemorrhage or mass effect. No other restricted diffusion. No midline shift, mass effect, evidence of mass lesion, ventriculomegaly, extra-axial collection or acute intracranial hemorrhage. Cervicomedullary junction and pituitary are within normal limits. Vascular: Major intracranial vascular flow voids are stable. Generalized intracranial artery tortuosity. Skull and upper cervical spine: Negative for age visible upper cervical spine. Normal background bone marrow signal. Sinuses/Orbits: Stable, negative. Other: Mastoids remain clear. Visible internal auditory structures appear normal. Negative visible scalp and face. IMPRESSION: 1. Acute on severe chronic small vessel disease, in the form of a tiny linear acute infarct in the posterior  left cerebellum. No associated hemorrhage or mass effect. 2. Advanced chronic small vessel ischemia appears significantly progressed since the 2022 MRI in the bilateral deep gray nuclei and brainstem. And posterior right MCA territory ischemic encephalomalacia has progressed since that time Electronically Signed   By: Odessa Fleming M.D.   On: 02/03/2023 10:49    MRI reveals no acute severe chronic small vessel disease   PROCEDURES:  Critical Care performed: No  Procedures   MEDICATIONS ORDERED IN ED: Medications  aspirin chewable tablet 324 mg (has no administration in time range)  amLODipine (NORVASC) tablet 5 mg (5 mg Oral Given 02/03/23 0920)     IMPRESSION / MDM / ASSESSMENT AND PLAN / ED COURSE  I reviewed the triage vital signs and the nursing notes.  Differential diagnosis includes, but is not limited to, possible stroke, electrolyte abnormality, anemia, or other insidious possible neurologic process.  Patient's presentation is most consistent with acute complicated illness / injury requiring diagnostic workup.  Patient has seen neurology as of March, and at that time noted to have several ischemic infarction violating the cerebellum.  He is currently prescribed aspirin and Plavix but again he seems to have a issue with medication compliance.  I did counsel him extensively on that.  Does not have a PCP, sees cardiology only.   ----------------------------------------- 9:39 AM on 02/03/2023 ----------------------------------------- EKG compared with previous ECGs, no significant change noted   Consult placed with Dr. Thad Ranger.  Discussed with patient we will admit for further workup stroke management to hospitalist.  He is not a TNK candidate, he does not have a discrete time in the last 24 hours that symptoms started, but rather describes a continuation of his chronic symptomatology.  Imaging does however demonstrate acute ischemia.  Consulted  neurology.  Permissive hypertension       FINAL CLINICAL IMPRESSION(S) / ED DIAGNOSES   Final diagnoses:  Ischemic stroke (HCC)  Hypertension, unspecified type     Rx / DC Orders   ED Discharge Orders     None        Note:  This document was prepared using Dragon voice recognition software and may include unintentional dictation errors.   Sharyn Creamer, MD 02/03/23 [phone]

## 2023-02-03 NOTE — Assessment & Plan Note (Addendum)
Restarted Lipitor.  LDL 137

## 2023-02-03 NOTE — ED Notes (Signed)
Pt brother, Luisa Hart, updated.

## 2023-02-03 NOTE — Assessment & Plan Note (Addendum)
Baseline CAD  Restart aspirin Toprol and Lipitor.

## 2023-02-03 NOTE — H&P (Addendum)
History and Physical    Patient: Kenneth Morales DOB: Sep 07, 1965 DOA: 02/03/2023 DOS: the patient was seen and examined on 02/03/2023 PCP: [facility]  Patient coming from: Home  Chief Complaint:  Chief Complaint  Patient presents with   Weakness   HPI: Kenneth Morales is a 57 y.o. male with medical history significant of CAD status post stenting, remote history of cocaine abuse, hypertension, hyperlipidemia, tobacco abuse, history of CVAs in the past presenting with weakness CVA.  Per report, patient with worsening slurred speech and confusion over multiple days.  Baseline history of multiple strokes in the past.  Has been seen by both neurology and cardiology within the [facility] system.  Noted admission in 2019 for NSTEMI.  Has had multiple stents.  Has not been compliant with medication regimen including aspirin, Plavix, statin.  Still smoking 1/2 pack/day.  Predominantly worsening confusion.  No reports of chest pain, shortness of breath, no reports of nausea or vomiting.  Positive weakness.  Positive mild confusion.  No reports of alcohol use. Presented to the ER afebrile, blood pressure 200s over 100s.  Satting well on room air.  Labs grossly stable.  MRI of the brain with acute on severe chronic tiny linear infarcts in the posterior left cerebellum.  Advanced chronic small vessel ischemia and posterior right MCA territory ischemic encephalomalacia. Review of Systems: As mentioned in the history of present illness. All other systems reviewed and are negative. Past Medical History:  Diagnosis Date   Acute [place] [facility]) MAY 2013   CAD (coronary artery disease)    NSTEMI in 05/13 in setting of Cocaine use. Cath: 99% mid LCX stenosis with a large thrombus. PCI and BMS (4.0 X15 mm Vision) placement to mid LCX, LAD: 20%, RCA: 30%, EF: 60%.    Cocaine abuse (HCC)    quit in 08/2011   Depression    Hyperlipidemia    Hypertension    Spider bite    Tobacco abuse    Past  Surgical History:  Procedure Laterality Date   CARDIAC CATHETERIZATION  MAY 2013   s/p stent @ [facility]   CARDIAC CATHETERIZATION     CORONARY CTO INTERVENTION N/A 01/27/2018   Procedure: CORONARY CTO INTERVENTION;  Surgeon: Corky Crafts, MD;  Location: [facility] INVASIVE CV LAB;  Service: Cardiovascular;  Laterality: N/A;   CORONARY STENT INTERVENTION N/A 11/29/2017   Procedure: CORONARY STENT INTERVENTION;  Surgeon: Alwyn Pea, MD;  Location: [facility] INVASIVE CV LAB;  Service: Cardiovascular;  Laterality: N/A;   CORONARY STENT INTERVENTION N/A 01/27/2018   Procedure: CORONARY STENT INTERVENTION;  Surgeon: Corky Crafts, MD;  Location: [facility] INVASIVE CV LAB;  Service: Cardiovascular;  Laterality: N/A;  mid cfx   HEMORRHOID SURGERY     LEFT HEART CATH AND CORONARY ANGIOGRAPHY Right 11/29/2017   Procedure: Left heart catheterization with possible PCI;  Surgeon: Laurier Nancy, MD;  Location: [facility] INVASIVE CV LAB;  Service: Cardiovascular;  Laterality: Right;   LEFT HEART CATH AND CORONARY ANGIOGRAPHY N/A 01/27/2018   Procedure: LEFT HEART CATH AND CORONARY ANGIOGRAPHY;  Surgeon: Corky Crafts, MD;  Location: [facility] INVASIVE CV LAB;  Service: Cardiovascular;  Laterality: N/A;   LEFT HEART CATH AND CORONARY ANGIOGRAPHY N/A 11/15/2018   Procedure: LEFT HEART CATH AND CORONARY ANGIOGRAPHY and possible pci and stent;  Surgeon: Alwyn Pea, MD;  Location: [facility] INVASIVE CV LAB;  Service: Cardiovascular;  Laterality: N/A;   Social History:  reports that he has been smoking cigarettes. He  has a 15 pack-year smoking history. He has never used smokeless tobacco. He reports current alcohol use of about 6.0 standard drinks of alcohol per week. He reports that he does not use drugs.  Allergies  Allergen Reactions   Brilinta [Ticagrelor] Shortness Of Breath   Penicillins Rash    Has patient had a PCN reaction causing immediate rash, facial/tongue/throat swelling, SOB or lightheadedness with  hypotension: Yes Has patient had a PCN reaction causing severe rash involving mucus membranes or skin necrosis: No Has patient had a PCN reaction that required hospitalization: No Has patient had a PCN reaction occurring within the last 10 years: No If all of the above answers are "NO", then may proceed with Cephalosporin use.    Family History  Problem Relation Age of Onset   Heart attack Maternal Grandfather    Heart attack Paternal Grandfather     Prior to Admission medications   Medication Sig Start Date End Date Taking? Authorizing Provider  acetaminophen (TYLENOL) 325 MG tablet Take 2 tablets (650 mg total) by mouth every 4 (four) hours as needed for mild pain (or temp > 37.5 C (99.5 F)). 07/14/18  Yes Angiulli, Mcarthur Rossetti, PA-C  famotidine (PEPCID) 20 MG tablet Take 1 tablet (20 mg total) by mouth 2 (two) times daily. 07/18/18  Yes Jones Bales, NP  nitroGLYCERIN (NITROSTAT) 0.4 MG SL tablet Place 1 tablet (0.4mg  total) under the tongue every 5 minutes as needed for chest pain. May take 3 doses. 07/18/18  Yes Jones Bales, NP  polyethylene glycol (MIRALAX / GLYCOLAX) packet Take 17 g by mouth daily. 07/08/18  Yes Ojie, Jude, MD  amLODipine (NORVASC) 10 MG tablet Take 10 mg by mouth daily.    [provider]  amLODipine (NORVASC) 5 MG tablet Take 1 tablet (5 mg total) by mouth daily. Patient not taking: Reported on 02/03/2023 07/14/18   Angiulli, Mcarthur Rossetti, PA-C  aspirin 81 MG tablet Take 1 tablet (81 mg total) by mouth daily. 07/18/18   Jones Bales, NP  atorvastatin (LIPITOR) 80 MG tablet Take 1 tablet (80 mg total) by mouth daily at 6 PM. 07/14/18   Angiulli, Mcarthur Rossetti, PA-C  clopidogrel (PLAVIX) 75 MG tablet Take 1 tablet (75 mg total) by mouth daily. 07/14/18   Angiulli, Mcarthur Rossetti, PA-C  metoprolol succinate (TOPROL-XL) 25 MG 24 hr tablet Take 25 mg by mouth daily.    [provider]  nortriptyline (PAMELOR) 10 MG capsule Take 20 mg by mouth at bedtime.  Patient  not taking: Reported on 02/03/2023    [provider]  ranolazine (RANEXA) 500 MG 12 hr tablet Take 1 tablet (500 mg total) by mouth 2 (two) times daily. Patient not taking: Reported on 02/03/2023 07/14/18   Charlton Amor, PA-C    Physical Exam: Vitals:   02/03/23 0820 02/03/23 0826  BP:  (!) 207/116  Pulse:  76  Resp:  14  Temp:  98 F (36.7 C)  TempSrc:  Oral  SpO2:  98%  Weight: 72.6 kg   Height: 5\' 10"  (1.778 m)    Physical Exam Constitutional:      Appearance: He is normal weight.  HENT:     Head: Normocephalic and atraumatic.     Nose: Nose normal.  Eyes:     Pupils: Pupils are equal, round, and reactive to light.  Cardiovascular:     Rate and Rhythm: Normal rate and regular rhythm.  Pulmonary:     Effort: Pulmonary effort is  normal.  Abdominal:     General: Bowel sounds are normal.  Musculoskeletal:        General: Normal range of motion.     Cervical back: Normal range of motion.  Skin:    General: Skin is warm.  Neurological:     Comments: + slurred speech and minimal to mild confusion    Psychiatric:        Mood and Affect: Mood normal.     Data Reviewed:  There are no new results to review at this time. MR BRAIN WO CONTRAST CLINICAL DATA:  57 year old male with stroke-like symptoms. Left side weakness and slurred speech.  EXAM: MRI HEAD WITHOUT CONTRAST  TECHNIQUE: Multiplanar, multiecho pulse sequences of the brain and surrounding structures were obtained without intravenous contrast.  COMPARISON:  Brain MRI 11/07/2020.  FINDINGS: Brain: Progressive post ischemic appearing encephalomalacia in the posterior right MCA territory, evolution of cortical encephalomalacia in the right parietal lobe including the sensory strip on series 9, image 41. Encephalomalacia tracks from their toward the right occipital pole. Patchy associated mild hemosiderin. Chronic lacunar infarcts scattered in the bilateral deep gray nuclei, and the right  corona radiata and also appear progressed since 2022. Multiple chronic lacunar infarcts in the pons are new since 2022. And there is evidence of developing Wallerian degeneration in the right cerebellar peduncle. A chronic lacunar infarct of the right medulla on series 8, image 5 is also increased since the prior.  Multiple small chronic lacunar infarcts in the bilateral cerebellum.  And there is a superimposed small subcentimeter acute infarct in the posterior left cerebellum with restricted diffusion on series 5, image 13. Faint T2 and FLAIR hyperintensity. No hemorrhage or mass effect.  No other restricted diffusion. No midline shift, mass effect, evidence of mass lesion, ventriculomegaly, extra-axial collection or acute intracranial hemorrhage. Cervicomedullary junction and pituitary are within normal limits.  Vascular: Major intracranial vascular flow voids are stable. Generalized intracranial artery tortuosity.  Skull and upper cervical spine: Negative for age visible upper cervical spine. Normal background bone marrow signal.  Sinuses/Orbits: Stable, negative.  Other: Mastoids remain clear. Visible internal auditory structures appear normal. Negative visible scalp and face.  IMPRESSION: 1. Acute on severe chronic small vessel disease, in the form of a tiny linear acute infarct in the posterior left cerebellum. No associated hemorrhage or mass effect.  2. Advanced chronic small vessel ischemia appears significantly progressed since the 2022 MRI in the bilateral deep gray nuclei and brainstem. And posterior right MCA territory ischemic encephalomalacia has progressed since that time  Electronically Signed   By: Odessa Fleming M.D.   On: 02/03/2023 10:49  Lab Results  Component Value Date   WBC 9.3 02/03/2023   HGB 16.3 02/03/2023   HCT 48.4 02/03/2023   MCV 91.8 02/03/2023   PLT 275 02/03/2023   Last metabolic panel Lab Results  Component Value Date   GLUCOSE 80  02/03/2023   NA 137 02/03/2023   K 4.0 02/03/2023   CL 101 02/03/2023   CO2 27 02/03/2023   BUN 16 02/03/2023   CREATININE 1.07 02/03/2023   GFRNONAA >60 02/03/2023   CALCIUM 8.9 02/03/2023   PROT 7.7 02/03/2023   ALBUMIN 4.1 02/03/2023   BILITOT 1.6 (H) 02/03/2023   ALKPHOS 94 02/03/2023   AST 20 02/03/2023   ALT 8 02/03/2023   ANIONGAP 9 02/03/2023    Assessment and Plan: Stroke (cerebrum) (HCC) Recurring dysarthria, confusion and weakness over multiple weeks with noted  Acute on severe  chronic small vessel disease, tiny linear acute infarcts in the posterior left cerebellum on imaging  Noted prior history of CVA in the past including neurology evaluation March 2024 in the [facility] system with noted medical noncompliance Has not been compliant with regular aspirin, Plavix and statin use Restart regimen  Formal stroke evaluation including 2D echo, CTA of the head neck, risk stratification labs Follow up neurology recommendations    CAD (coronary artery disease) Baseline CAD  NSTEMI in 05/13 in setting of Cocaine use, with multiple stents  Followed by Dr. Juliann Pares  Noncompliant w/ cardiac regimen  Will restart home regimen including asa, statin, plavix, BB  Cardiology c/s as clinically indicated   Cocaine abuse (HCC) Reports last use 03/2022  UDS pending    Tobacco abuse Still smoking 1/2 PPD  Discussed cessation  Nicotine patch  Hypertension BP 200s over 100s Allow for permissive hypertension in the setting of CVA As needed IV hydralazine for systolic pressures greater than 220 or diastolic pressures greater than 110 Continue to further titrate BP regimen  Hyperlipidemia Lipid panel pending  Restart statin     Greater than 50% was spent in counseling and coordination of care with patient Total encounter time 80 minutes or more    Advance Care Planning:   Code Status: Prior   Consults: Neurology   Family Communication: No family at the bedside   Severity  of Illness: The appropriate patient status for this patient is OBSERVATION. Observation status is judged to be reasonable and necessary in order to provide the required intensity of service to ensure the patient's safety. The patient's presenting symptoms, physical exam findings, and initial radiographic and laboratory data in the context of their medical condition is felt to place them at decreased risk for further clinical deterioration. Furthermore, it is anticipated that the patient will be medically stable for discharge from the hospital within 2 midnights of admission.   Author: Floydene Flock, MD 02/03/2023 1:15 PM  For on call review [email or web address].

## 2023-02-03 NOTE — ED Triage Notes (Addendum)
Pt to ed from home for possible stroke. Pt has HX of previous stroke was in December. Pt had "white matter on his brain" per pt. Per MAR no diagnosed stroke in chart.  Pt is here today for same symptoms, however they have been going on for the last few months. Pt is caox4, in no acute distress and ambulatory.  Pt "family friend" called [facility] to schedule an apt and they advised to come straight here. Per pt his weakness was on his left side and slurred speech and those symptoms are the same today. They remained after his previous stroke. He advised he was in [facility] for awhile and had to attend rehab.

## 2023-02-03 NOTE — Assessment & Plan Note (Addendum)
Urine toxicology positive for cocaine.  He states he had smoked crack cocaine.  Advised he must stop this.

## 2023-02-03 NOTE — Assessment & Plan Note (Addendum)
Started low-dose Toprol and will start low-dose losartan tonight.

## 2023-02-03 NOTE — Assessment & Plan Note (Addendum)
-  Nicotine patch 

## 2023-02-04 ENCOUNTER — Observation Stay
Admit: 2023-02-04 | Discharge: 2023-02-04 | Disposition: A | Discharge status: Home / Self Care | Payer: Medicaid Other | Attending: Family Medicine | Admitting: Family Medicine

## 2023-02-04 DIAGNOSIS — I10 Essential (primary) hypertension: Secondary | ICD-10-CM | POA: Diagnosis not present

## 2023-02-04 DIAGNOSIS — F1410 Cocaine abuse, uncomplicated: Secondary | ICD-10-CM

## 2023-02-04 DIAGNOSIS — I639 Cerebral infarction, unspecified: Secondary | ICD-10-CM | POA: Diagnosis not present

## 2023-02-04 DIAGNOSIS — I2510 Atherosclerotic heart disease of native coronary artery without angina pectoris: Secondary | ICD-10-CM | POA: Diagnosis not present

## 2023-02-04 LAB — ECHOCARDIOGRAM COMPLETE
AR max vel: 2.67 cm2
AV Area VTI: 2.98 cm2
AV Area mean vel: 2.77 cm2
AV Mean grad: 4 mm[Hg]
AV Peak grad: 10.1 mm[Hg]
Ao pk vel: 1.59 m/s
Area-P 1/2: 2.93 cm2
Calc EF: 43.9 %
Height: 70 in
MV VTI: 2.53 cm2
P 1/2 time: 577 ms
S' Lateral: 4.5 cm
Single Plane A2C EF: 50.9 %
Single Plane A4C EF: 40.4 %
Weight: 2560 [oz_av]

## 2023-02-04 LAB — LIPID PANEL
Cholesterol: 189 mg/dL (ref 0–200)
HDL: 35 mg/dL — ABNORMAL LOW (ref >40)
LDL Cholesterol: 137 mg/dL — ABNORMAL HIGH (ref 0–99)
Total CHOL/HDL Ratio: 5.4 {ratio}
Triglycerides: 84 mg/dL (ref <150)
VLDL: 17 mg/dL (ref 0–40)

## 2023-02-04 LAB — URINE DRUG SCREEN, QUALITATIVE (ARMC ONLY)
Amphetamines, Ur Screen: NOT DETECTED
Barbiturates, Ur Screen: NOT DETECTED
Benzodiazepine, Ur Scrn: NOT DETECTED
Cannabinoid 50 Ng, Ur ~~LOC~~: NOT DETECTED
Cocaine Metabolite,Ur ~~LOC~~: POSITIVE — AB
MDMA (Ecstasy)Ur Screen: NOT DETECTED
Methadone Scn, Ur: NOT DETECTED
Opiate, Ur Screen: NOT DETECTED
Phencyclidine (PCP) Ur S: NOT DETECTED
Tricyclic, Ur Screen: NOT DETECTED

## 2023-02-04 MED ORDER — LOSARTAN POTASSIUM 25 MG PO TABS: 25.0000 mg | ORAL_TABLET | Freq: Every day | ORAL | Status: DC

## 2023-02-04 MED ORDER — METOPROLOL SUCCINATE ER 25 MG PO TB24
25.0000 mg | ORAL_TABLET | Freq: Every day | ORAL | Status: DC
  Administered 2023-02-04: 25 mg via ORAL
  Filled 2023-02-04: qty 1

## 2023-02-04 MED ORDER — ATORVASTATIN CALCIUM 80 MG PO TABS: 80.0000 mg | ORAL_TABLET | Freq: Every day | ORAL | 0 refills | Status: DC

## 2023-02-04 MED ORDER — LOSARTAN POTASSIUM 25 MG PO TABS: 25.0000 mg | ORAL_TABLET | Freq: Every day | ORAL | 0 refills | Status: DC

## 2023-02-04 MED ORDER — ASPIRIN 81 MG PO TABS: 81.0000 mg | ORAL_TABLET | Freq: Every day | ORAL | 0 refills | Status: DC

## 2023-02-04 MED ORDER — NITROGLYCERIN 0.4 MG SL SUBL: SUBLINGUAL_TABLET | SUBLINGUAL | 1 refills | Status: DC

## 2023-02-04 MED ORDER — NICOTINE 14 MG/24HR TD PT24: MEDICATED_PATCH | TRANSDERMAL | 0 refills | Status: DC

## 2023-02-04 MED ORDER — CLOPIDOGREL BISULFATE 75 MG PO TABS: 75.0000 mg | ORAL_TABLET | Freq: Every day | ORAL | 0 refills | Status: DC

## 2023-02-04 MED ORDER — ENSURE ENLIVE PO LIQD: 237.0000 mL | Freq: Two times a day (BID) | ORAL | 0 refills | Status: DC

## 2023-02-04 MED ORDER — METOPROLOL SUCCINATE ER 25 MG PO TB24: 25.0000 mg | ORAL_TABLET | Freq: Every day | ORAL | 0 refills | Status: DC

## 2023-02-04 NOTE — Progress Notes (Signed)
*  PRELIMINARY RESULTS* Echocardiogram 2D Echocardiogram has been performed.  Carolyne Fiscal 02/04/2023, 1:00 PM

## 2023-02-04 NOTE — Assessment & Plan Note (Addendum)
Patient with left cerebellar stroke on MRI.  Patient with unsteady gait and left-sided weakness and dysarthria.  Prescribed aspirin, Plavix (21 days), Lipitor.  Advised he should be on aspirin and Lipitor lifelong.  PT recommending home health but patient wanted to do outpatient therapy.  Walker, cane, bedside commode and tub bench prescribed.  Neurology recommended a heart monitor as outpatient to rule out atrial fibrillation.

## 2023-02-04 NOTE — Plan of Care (Signed)
  Problem: Education: Goal: Knowledge of disease or condition will improve 02/04/2023 0556 by Barrie Folk, RN Outcome: Progressing 02/04/2023 0554 by Barrie Folk, RN Outcome: Progressing Goal: Knowledge of secondary prevention will improve (MUST DOCUMENT ALL) 02/04/2023 0556 by Barrie Folk, RN Outcome: Progressing 02/04/2023 0554 by Barrie Folk, RN Outcome: Progressing Goal: Knowledge of patient specific risk factors will improve Loraine Leriche N/A or DELETE if not current risk factor) 02/04/2023 0556 by Barrie Folk, RN Outcome: Progressing 02/04/2023 0554 by Barrie Folk, RN Outcome: Progressing   Problem: Ischemic Stroke/TIA Tissue Perfusion: Goal: Complications of ischemic stroke/TIA will be minimized 02/04/2023 0556 by Barrie Folk, RN Outcome: Progressing 02/04/2023 0554 by Barrie Folk, RN Outcome: Progressing   Problem: Coping: Goal: Will verbalize positive feelings about self 02/04/2023 0556 by Barrie Folk, RN Outcome: Progressing 02/04/2023 0554 by Barrie Folk, RN Outcome: Progressing Goal: Will identify appropriate support needs 02/04/2023 0556 by Barrie Folk, RN Outcome: Progressing 02/04/2023 0554 by Barrie Folk, RN Outcome: Progressing   Problem: Health Behavior/Discharge Planning: Goal: Ability to manage health-related needs will improve 02/04/2023 0556 by Barrie Folk, RN Outcome: Progressing 02/04/2023 0554 by Barrie Folk, RN Outcome: Progressing Goal: Goals will be collaboratively established with patient/family 02/04/2023 0556 by Barrie Folk, RN Outcome: Progressing 02/04/2023 0554 by Barrie Folk, RN Outcome: Progressing   Problem: Self-Care: Goal: Ability to participate in self-care as condition permits will improve 02/04/2023 0556 by Barrie Folk, RN Outcome: Progressing 02/04/2023 0554 by  Barrie Folk, RN Outcome: Progressing Goal: Verbalization of feelings and concerns over difficulty with self-care will improve 02/04/2023 0556 by Barrie Folk, RN Outcome: Progressing 02/04/2023 0554 by Barrie Folk, RN Outcome: Progressing Goal: Ability to communicate needs accurately will improve 02/04/2023 0556 by Barrie Folk, RN Outcome: Progressing 02/04/2023 0554 by Barrie Folk, RN Outcome: Progressing   Problem: Nutrition: Goal: Risk of aspiration will decrease 02/04/2023 0556 by Barrie Folk, RN Outcome: Progressing 02/04/2023 0554 by Barrie Folk, RN Outcome: Progressing Goal: Dietary intake will improve 02/04/2023 0556 by Barrie Folk, RN Outcome: Progressing 02/04/2023 0554 by Barrie Folk, RN Outcome: Progressing   Problem: Education: Goal: Knowledge of General Education information will improve Description: Including pain rating scale, medication(s)/side effects and non-pharmacologic comfort measures Outcome: Progressing   Problem: Health Behavior/Discharge Planning: Goal: Ability to manage health-related needs will improve Outcome: Progressing   Problem: Clinical Measurements: Goal: Ability to maintain clinical measurements within normal limits will improve Outcome: Progressing Goal: Will remain free from infection Outcome: Progressing Goal: Diagnostic test results will improve Outcome: Progressing Goal: Respiratory complications will improve Outcome: Progressing Goal: Cardiovascular complication will be avoided Outcome: Progressing   Problem: Activity: Goal: Risk for activity intolerance will decrease Outcome: Progressing   Problem: Nutrition: Goal: Adequate nutrition will be maintained Outcome: Progressing   Problem: Coping: Goal: Level of anxiety will decrease Outcome: Progressing   Problem: Elimination: Goal: Will not experience complications related to bowel  motility Outcome: Progressing Goal: Will not experience complications related to urinary retention Outcome: Progressing

## 2023-02-04 NOTE — TOC Initial Note (Signed)
Transition of Care [facility]) - Initial/Assessment Note    Patient Details  Name: Kenneth Morales MRN: [id number] Date of Birth: [birth year]  Transition of Care [facility]) CM/SW Contact:    Liliana Cline, LCSW Phone Number: 02/04/2023, 11:41 AM  Clinical Narrative:                 Met with patient and patient's friend Laurence Slate at bedside to discuss [facility] and DME recs.  Patient states he lives home alone. His friend Ivar Drape will be providing support/care at DC. PCP is [facility]. Pharmacy is [facility]. Willie to transport.  Patient states he wants to request a cane, BSC, and tub bench - patient also agreeable to RW rec. Referral made to Jon with [employer] for DME to be delivered to the bedside today.  Patient states he does not want HH, he prefers OPPT at [facility]. Ivar Drape states she will call for the patient to get him set up with an appt for OPPT at [facility].  Ivar Drape to transport home. Confirmed home address [address] [place] [address].  Expected Discharge Plan: OP Rehab Barriers to Discharge: Barriers Resolved   Patient Goals and CMS Choice Patient states their goals for this hospitalization and ongoing recovery are:: prefers OPPT over HHPT CMS Medicare.gov Compare Post Acute Care list provided to:: Patient Choice offered to / list presented to : Patient      Expected Discharge Plan and Services       Living arrangements for the past 2 months: Single Family Home                 DME Arranged: Ephraim Hamburger, Tub bench, Bedside commode DME Agency: [employer] Date DME Agency Contacted: 02/04/23   Representative spoke with at DME Agency: Cletis Athens            Prior Living Arrangements/Services Living arrangements for the past 2 months: Single Family Home Lives with:: Self Patient language and need for interpreter reviewed:: Yes Do you feel safe going back to the place where you live?: Yes      Need for Family Participation in Patient Care: Yes  (Comment) Care giver support system in place?: Yes (comment)   Criminal Activity/Legal Involvement Pertinent to Current Situation/Hospitalization: No - Comment as needed  Activities of Daily Living   ADL Screening (condition at time of admission) Independently performs ADLs?: No Does the patient have a NEW difficulty with bathing/dressing/toileting/self-feeding that is expected to last >3 days?: Yes (Initiates electronic notice to provider for possible OT consult) Does the patient have a NEW difficulty with getting in/out of bed, walking, or climbing stairs that is expected to last >3 days?: Yes (Initiates electronic notice to provider for possible PT consult) Does the patient have a NEW difficulty with communication that is expected to last >3 days?: No Is the patient deaf or have difficulty hearing?: No Does the patient have difficulty seeing, even when wearing glasses/contacts?: No Does the patient have difficulty concentrating, remembering, or making decisions?: No  Permission Sought/Granted Permission sought to share information with : Facility Medical sales representative, Family Supports Permission granted to share information with : Yes, Verbal Permission Granted     Permission granted to share info w AGENCY: DME  Permission granted to share info w Relationship: Laurence Slate - friend     Emotional Assessment       Orientation: : Oriented to Self, Oriented to Place, Oriented to  Time, Oriented to Situation Alcohol / Substance Use: Not Applicable  Psych Involvement: No (comment)  Admission diagnosis:  CVA (cerebral vascular accident) (HCC) [I63.9] Ischemic stroke (HCC) [I63.9] Hypertension, unspecified type [I10] Patient Active Problem List   Diagnosis Date Noted   CVA (cerebral vascular accident) (HCC) 02/03/2023   Ataxia, post-stroke    Gait disturbance, post-stroke    Intractable hiccoughs    Cerebellar infarction (HCC) 07/07/2018   Stroke (cerebrum) (HCC) 07/04/2018    Unstable angina (HCC) 01/26/2018   NSTEMI (non-ST elevated myocardial infarction) (HCC) 11/28/2017   Malignant essential hypertension 07/01/2016   Chest pain 07/01/2016   SOB (shortness of breath) 12/10/2011   CAD (coronary artery disease)    Hyperlipidemia    Hypertension    Tobacco abuse    Cocaine abuse (HCC)    PCP:  [facility] Pharmacy:   [facility] [address] [place] [address] Phone: [phone] Fax: [phone]  [facility] - [place], [place] - [address] [place] [address] Phone: [phone] Fax: [phone]  [facility] [address] [place] [facility] [address] [place] [address] Phone: [phone] Fax: [phone]     Social Determinants of Health (SDOH) Social History: SDOH Screenings   Food Insecurity: Food Insecurity Present (02/03/2023)  Housing: Low Risk  (02/03/2023)  Transportation Needs: No Transportation Needs (02/03/2023)  Utilities: At Risk (02/03/2023)  Tobacco Use: High Risk (02/03/2023)   SDOH Interventions:     Readmission Risk Interventions    02/04/2023   11:40 AM  Readmission Risk Prevention Plan  Post Dischage Appt Complete  Medication Screening Complete  Transportation Screening Complete

## 2023-02-04 NOTE — Plan of Care (Signed)
  Problem: Education: Goal: Knowledge of disease or condition will improve Outcome: Progressing Goal: Knowledge of secondary prevention will improve (MUST DOCUMENT ALL) Outcome: Progressing Goal: Knowledge of patient specific risk factors will improve Loraine Leriche N/A or DELETE if not current risk factor) Outcome: Progressing   Problem: Ischemic Stroke/TIA Tissue Perfusion: Goal: Complications of ischemic stroke/TIA will be minimized Outcome: Progressing   Problem: Coping: Goal: Will verbalize positive feelings about self Outcome: Progressing Goal: Will identify appropriate support needs Outcome: Progressing   Problem: Health Behavior/Discharge Planning: Goal: Ability to manage health-related needs will improve Outcome: Progressing Goal: Goals will be collaboratively established with patient/family Outcome: Progressing   Problem: Self-Care: Goal: Ability to participate in self-care as condition permits will improve Outcome: Progressing Goal: Verbalization of feelings and concerns over difficulty with self-care will improve Outcome: Progressing Goal: Ability to communicate needs accurately will improve Outcome: Progressing   Problem: Nutrition: Goal: Risk of aspiration will decrease Outcome: Progressing Goal: Dietary intake will improve Outcome: Progressing   Problem: Education: Goal: Knowledge of General Education information will improve Description: Including pain rating scale, medication(s)/side effects and non-pharmacologic comfort measures Outcome: Progressing   Problem: Health Behavior/Discharge Planning: Goal: Ability to manage health-related needs will improve Outcome: Progressing   Problem: Clinical Measurements: Goal: Ability to maintain clinical measurements within normal limits will improve Outcome: Progressing Goal: Will remain free from infection Outcome: Progressing Goal: Diagnostic test results will improve Outcome: Progressing Goal: Respiratory  complications will improve Outcome: Progressing Goal: Cardiovascular complication will be avoided Outcome: Progressing   Problem: Activity: Goal: Risk for activity intolerance will decrease Outcome: Progressing   Problem: Nutrition: Goal: Adequate nutrition will be maintained Outcome: Progressing   Problem: Coping: Goal: Level of anxiety will decrease Outcome: Progressing   Problem: Elimination: Goal: Will not experience complications related to bowel motility Outcome: Progressing Goal: Will not experience complications related to urinary retention Outcome: Progressing   Problem: Pain Managment: Goal: General experience of comfort will improve Outcome: Progressing   Problem: Safety: Goal: Ability to remain free from injury will improve Outcome: Progressing   Problem: Skin Integrity: Goal: Risk for impaired skin integrity will decrease Outcome: Progressing   Problem: Education: Goal: Knowledge of General Education information will improve Description: Including pain rating scale, medication(s)/side effects and non-pharmacologic comfort measures Outcome: Progressing

## 2023-02-04 NOTE — Evaluation (Signed)
Occupational Therapy Evaluation Patient Details Name: Kenneth Morales MRN: [id number] DOB: 1965-12-01 Today's Date: 02/04/2023   History of Present Illness 57 y.o. male with PMHx of CAD s/p stenting, remote history of cocaine abuse, HTN, HLD, tobacco abuse, history of CVAs in the past presenting with weakness, mult falls. Per report, pt with worsening slurred speech and confusion over multiple days. MRI of the brain with acute on severe chronic tiny linear infarcts in the posterior left cerebellum.   Clinical Impression   Pt was seen for OT evaluation this date. Prior to hospital admission, pt was living alone in a ground level apartment. Reports no AD use, did have a SPC. IND with ADL performance, unclear if he had assistance with IADL management. Has neighbors with AKA who he reported he assists, however he reported to PT they assist him.   Pt presents to acute OT demonstrating impaired ADL performance and functional mobility 2/2 L hand weakness, mild FMC deficits in L hand, balance deficits and limited safety awareness with multiple falls at home (See OT problem list for additional functional deficits). Pt currently requires MOD I with bed mobility and SUP for STS from EOB. Pt refused to use RW for mobility to the bathroom, but grabbed to walls, door frame and grab bar for balance requiring SBA. He demo toilet transfer with SBA using grab bar and SBA for clothing management in standing with no UE support. Pt able to don/doff socks seated at EOB with SUP.  Pt would benefit from skilled OT services to address noted impairments and functional limitations (see below for any additional details) in order to maximize safety and independence while minimizing falls risk and caregiver burden. Recommend HHOT consult for DME/AE/AD recommendations and home modifications as needed for safety.     If plan is discharge home, recommend the following: A little help with walking and/or transfers;A little help with  bathing/dressing/bathroom;Assistance with cooking/housework;Assist for transportation;Help with stairs or ramp for entrance    Functional Status Assessment  Patient has had a recent decline in their functional status and demonstrates the ability to make significant improvements in function in a reasonable and predictable amount of time.  Equipment Recommendations  Tub/shower seat;Other (comment) (FWW and handles or grab bar by toilet)    Recommendations for Other Services       Precautions / Restrictions Precautions Precautions: Fall Restrictions Weight Bearing Restrictions: No      Mobility Bed Mobility Overal bed mobility: Modified Independent                  Transfers Overall transfer level: Needs assistance   Transfers: Sit to/from Stand Sit to Stand: Supervision, Contact guard assist           General transfer comment: refused to use walker to walk to bathroom, instead gripped for walls/doors, grab bars, etc. needed close SBA      Balance Overall balance assessment: Needs assistance Sitting-balance support: Feet supported Sitting balance-Leahy Scale: Good     Standing balance support: Single extremity supported, During functional activity Standing balance-Leahy Scale: Fair Standing balance comment: able to manage LB clothing in standing and needed grab bar                           ADL either performed or assessed with clinical judgement   ADL Overall ADL's : Needs assistance/impaired  Lower Body Dressing: Supervision/safety Lower Body Dressing Details (indicate cue type and reason): to doff/don socks seated at EOB via leg crossing and for clothing management at toilet in standing Toilet Transfer: Grab bars;Supervision/safety Toilet Transfer Details (indicate cue type and reason): pt likely to need CGA without grab bar and does not have one at home Toileting- Clothing Manipulation and Hygiene:  Supervision/safety Toileting - Clothing Manipulation Details (indicate cue type and reason): SBA for clothing management       General ADL Comments: pt overall with some impuslivity and balance deficits requiring unilateral support most times to maintain balance     Vision Ability to See in Adequate Light: 1 Impaired Patient Visual Report: Blurring of vision Vision Assessment?: Yes Eye Alignment: Within Functional Limits Saccades: Additional eye shifts occurred during testing Visual Fields: No apparent deficits Additional Comments: tracking slightly impaired, pt able to track, but blinks and shifts back to midline then back to tracking item     Perception         Praxis         Pertinent Vitals/Pain Pain Assessment Pain Assessment: No/denies pain     Extremity/Trunk Assessment Upper Extremity Assessment Upper Extremity Assessment: Generalized weakness;LUE deficits/detail;Right hand dominant LUE Deficits / Details: L grip strength weakness and slight/mild FMC deficits LUE Coordination: decreased fine motor   Lower Extremity Assessment Lower Extremity Assessment: Defer to PT evaluation       Communication Communication Communication: No apparent difficulties   Cognition Arousal: Alert   Overall Cognitive Status: Within Functional Limits for tasks assessed                                       General Comments       Exercises     Shoulder Instructions      Home Living Family/patient expects to be discharged to:: Private residence Living Arrangements: Alone Available Help at Discharge: Neighbor;Family (told PT his brother lives down the road) Type of Home: Apartment Home Access: Level entry           Bathroom Shower/Tub: Chief Strategy Officer: Standard     Home Equipment: [employer] - single point          Prior Functioning/Environment Prior Level of Function : Independent/Modified Independent             Mobility  Comments: reporst he furniture/wall surfs at home ADLs Comments: IND with ADL, unsure of IADL IND-reports assisting neighbors who have AKAs, however told PT his neighbors assist him        OT Problem List: Decreased coordination;Impaired vision/perception;Decreased strength;Decreased knowledge of use of DME or AE;Impaired balance (sitting and/or standing);Decreased safety awareness      OT Treatment/Interventions: Self-care/ADL training;Therapeutic exercise;Patient/family education;Balance training;Therapeutic activities;DME and/or AE instruction    OT Goals(Current goals can be found in the care plan section) Acute Rehab OT Goals Patient Stated Goal: return home and get stronger OT Goal Formulation: With patient Time For Goal Achievement: 02/18/23 Potential to Achieve Goals: Good ADL Goals Pt Will Perform Lower Body Bathing: with set-up;sit to/from stand Pt Will Transfer to Toilet: with modified independence;ambulating Additional ADL Goal #1: Pt will verbalize/demo appropriate use of DME/AE/AD for safe ADL performance to return home and to PLOF.  OT Frequency: Min 1X/week    Co-evaluation              AM-PAC OT "6 Clicks" Daily  Activity     Outcome Measure Help from another person eating meals?: None Help from another person taking care of personal grooming?: None Help from another person toileting, which includes using toliet, bedpan, or urinal?: A Little Help from another person bathing (including washing, rinsing, drying)?: A Little Help from another person to put on and taking off regular upper body clothing?: None Help from another person to put on and taking off regular lower body clothing?: A Little 6 Click Score: 21   End of Session Nurse Communication: Mobility status  Activity Tolerance: Patient tolerated treatment well Patient left: in bed;with call bell/phone within reach;with bed alarm set  OT Visit Diagnosis: Unsteadiness on feet (R26.81);Other abnormalities  of gait and mobility (R26.89);Repeated falls (R29.6);Muscle weakness (generalized) (M62.81)                Time: 2956-2130 OT Time Calculation (min): 21 min Charges:  OT General Charges $OT Visit: 1 Visit OT Evaluation $OT Eval Low Complexity: 1 Low OT Treatments $Self Care/Home Management : 8-22 mins  Howard Bunte, OTR/L 02/04/23, 10:30 AM  Annalisse Minkoff E Junior Huezo 02/04/2023, 10:26 AM

## 2023-02-04 NOTE — Discharge Summary (Signed)
Physician Discharge Summary   Patient: Kenneth Morales MRN: [id number] DOB: 1966-04-07  Admit date:     02/03/2023  Discharge date: 02/04/23  Discharge Physician: Alford Highland   PCP: None  Recommendations at discharge:   Refer to new PCP Refer back to cardiology Dr. Juliann Pares  Discharge Diagnoses: Principal Problem:   Cerebellar stroke [facility]) Active Problems:   CAD (coronary artery disease)   Hyperlipidemia   Hypertension   Tobacco abuse   Cocaine abuse [facility])    Hospital Course: 57 year old man past medical history of CAD, hypertension, crack cocaine use, hypertension, hyperlipidemia, tobacco abuse, history of CVA.  Patient presenting with unsteady gait and falling over and some slurred speech.  Patient not taking any medications at home.  10/11.  MRI of the brain shows a acute infarct in the left posterior left cerebellum, posterior right MCA territory encephalomalacia which has progressed since the last imaging, advanced chronic small vessel ischemia.  CT angio head and neck showed no acute intracranial process cute left cerebellar infarct are not visualized on this exam.  Severely near occlusive stenosis of the origin of the left AICA, severely nearly occlusive stenosis at A2 segment of the left ACA, moderate to severe stenosis in M2 segment of the left MCA, moderate to severe focal stenosis of distal M1 segment of the right MCA, severe focal stenosis of the origin of the left PICA.  Echocardiogram did not show a source of the stroke but EF of 40%.  Neurology recommended outpatient cardiac monitoring which Dr. Glennis Brink team was able to set up prior to discharge.   Physical therapy and Occupational Therapy recommending home health.  Patient wanted to do outpatient therapy.  Assessment and Plan: * Cerebellar stroke [facility]) Patient with left cerebellar stroke on MRI.  Patient with unsteady gait and left-sided weakness and dysarthria.  Prescribed aspirin, Plavix (21 days), Lipitor.   Advised he should be on aspirin and Lipitor lifelong.  PT recommending home health but patient wanted to do outpatient therapy.  Walker, cane, bedside commode and tub bench prescribed.  Neurology recommended a heart monitor as outpatient to rule out atrial fibrillation.  CAD (coronary artery disease) Baseline CAD  Restart aspirin Toprol and Lipitor.  Cocaine abuse (HCC) Urine toxicology positive for cocaine.  He states he had smoked crack cocaine.  Advised he must stop this.   Tobacco abuse Nicotine patch  Hypertension Started low-dose Toprol and will start low-dose losartan tonight.  Hyperlipidemia Restarted Lipitor.  LDL 137          Consultants: Neurology.  Spoke with cardiology team and they set up with a heart monitor on discharge. Procedures performed: None Disposition: Home Diet recommendation:  Cardiac diet DISCHARGE MEDICATION: Allergies as of 02/04/2023       Reactions   Brilinta [ticagrelor] Shortness Of Breath   Penicillins Rash   Has patient had a PCN reaction causing immediate rash, facial/tongue/throat swelling, SOB or lightheadedness with hypotension: Yes Has patient had a PCN reaction causing severe rash involving mucus membranes or skin necrosis: No Has patient had a PCN reaction that required hospitalization: No Has patient had a PCN reaction occurring within the last 10 years: No If all of the above answers are "NO", then may proceed with Cephalosporin use.        Medication List     STOP taking these medications    amLODipine 10 MG tablet Commonly known as: NORVASC   amLODipine 5 MG tablet Commonly known as: NORVASC   famotidine 20 MG  tablet Commonly known as: Pepcid   nortriptyline 10 MG capsule Commonly known as: PAMELOR   polyethylene glycol 17 g packet Commonly known as: MIRALAX / GLYCOLAX   ranolazine 500 MG 12 hr tablet Commonly known as: RANEXA       TAKE these medications    acetaminophen 325 MG tablet Commonly  known as: TYLENOL Take 2 tablets (650 mg total) by mouth every 4 (four) hours as needed for mild pain (or temp > 37.5 C (99.5 F)).   aspirin 81 MG tablet Take 1 tablet (81 mg total) by mouth daily.   atorvastatin 80 MG tablet Commonly known as: LIPITOR Take 1 tablet (80 mg total) by mouth daily at 6 PM.   clopidogrel 75 MG tablet Commonly known as: PLAVIX Take 1 tablet (75 mg total) by mouth daily.   feeding supplement Liqd Take 237 mLs by mouth 2 (two) times daily between meals.   losartan 25 MG tablet Commonly known as: COZAAR Take 1 tablet (25 mg total) by mouth at bedtime.   metoprolol succinate 25 MG 24 hr tablet Commonly known as: TOPROL-XL Take 1 tablet (25 mg total) by mouth daily.   nicotine 14 mg/24hr patch Commonly known as: NICODERM CQ - dosed in mg/24 hours One 14 mg patch chest wall daily (okay to substitute generic) Start taking on: February 05, 2023   nitroGLYCERIN 0.4 MG SL tablet Commonly known as: NITROSTAT Place 1 tablet (0.4mg  total) under the tongue every 5 minutes as needed for chest pain. May take 3 doses.               Durable Medical Equipment  (From admission, onward)           Start     Ordered   02/04/23 1145  For home use only DME Walker rolling  Once       Question Answer Comment  Walker: With 5 Inch Wheels   Patient needs a walker to treat with the following condition Unsteady gait when walking      02/04/23 1145   02/04/23 1145  For home use only DME Cane  Once        02/04/23 1145   02/04/23 1145  For home use only DME Tub bench  Once        02/04/23 1145   02/04/23 1145  For home use only DME Bedside commode  Once       Question:  Patient needs a bedside commode to treat with the following condition  Answer:  Unsteady gait when walking   02/04/23 1145            Follow-up Information     Alwyn Pea, MD. Go on 02/11/2023.   Specialties: Cardiology, Internal Medicine Why: @ 10:15am Contact  information: [address] [place] [address] [phone]                Discharge Exam: Ceasar Mons Weights   02/03/23 0820  Weight: 72.6 kg   Physical Exam Neurological:     Mental Status: He is alert and oriented to person, place, and time.     Comments: Patient occasionally has a little slurred speech.  Left side 4+ out of 5 power.  Slight incoordination with finger-nose left hand and heel shin left foot.      Condition at discharge: stable  The results of significant diagnostics from this hospitalization (including imaging, microbiology, ancillary and laboratory) are listed below for reference.   Imaging Studies: ECHOCARDIOGRAM COMPLETE  Result Date: 02/04/2023    ECHOCARDIOGRAM REPORT   Patient Name:   KAELOB PERSKY [facility] Date of Exam: 02/04/2023 Medical Rec #:  [id number]       Height:       70.0 in Accession #:    [id number]      Weight:       160.0 lb Date of Birth:  02/27/1966       BSA:          1.898 m Patient Age:    57 years        BP:           149/96 mmHg Patient Gender: M               HR:           66 bpm. Exam Location:  [facility] Procedure: 2D Echo, 3D Echo, Cardiac Doppler, Color Doppler and Strain Analysis Indications:     TIA  History:         Patient has prior history of Echocardiogram examinations, most                  recent 11/13/2018. CAD, Previous Myocardial Infarction and                  Angina, TIA, Signs/Symptoms:Chest Pain and Shortness of Breath;                  Risk Factors:Hypertension, Dyslipidemia and Current Smoker.                  Cocaine abuse.  Sonographer:     Mikki Harbor Referring Phys:  [phone] Francoise Schaumann [place] Diagnosing Phys: Marcina Millard MD  Sonographer Comments: Global longitudinal strain was attempted. IMPRESSIONS  1. Left ventricular ejection fraction, by estimation, is 40 to 45%. The left ventricle has mildly decreased function. The left ventricle has no regional wall motion abnormalities. There is moderate left ventricular  hypertrophy. Left ventricular diastolic parameters are consistent with Grade I diastolic dysfunction (impaired relaxation).  2. Right ventricular systolic function is normal. The right ventricular size is normal. There is normal pulmonary artery systolic pressure.  3. The mitral valve is normal in structure. Mild mitral valve regurgitation. No evidence of mitral stenosis.  4. The aortic valve is normal in structure. Aortic valve regurgitation is moderate. No aortic stenosis is present.  5. The inferior vena cava is normal in size with greater than 50% respiratory variability, suggesting right atrial pressure of 3 mmHg. FINDINGS  Left Ventricle: Left ventricular ejection fraction, by estimation, is 40 to 45%. The left ventricle has mildly decreased function. The left ventricle has no regional wall motion abnormalities. The left ventricular internal cavity size was normal in size. There is moderate left ventricular hypertrophy. Left ventricular diastolic parameters are consistent with Grade I diastolic dysfunction (impaired relaxation). Right Ventricle: The right ventricular size is normal. No increase in right ventricular wall thickness. Right ventricular systolic function is normal. There is normal pulmonary artery systolic pressure. The tricuspid regurgitant velocity is 2.60 m/s, and  with an assumed right atrial pressure of 3 mmHg, the estimated right ventricular systolic pressure is 30.0 mmHg. Left Atrium: Left atrial size was normal in size. Right Atrium: Right atrial size was normal in size. Pericardium: There is no evidence of pericardial effusion. Mitral Valve: The mitral valve is normal in structure. Mild mitral valve regurgitation. No evidence of mitral valve stenosis. MV peak gradient, 3.9 mmHg. The mean mitral valve gradient is  2.0 mmHg. Tricuspid Valve: The tricuspid valve is normal in structure. Tricuspid valve regurgitation is mild . No evidence of tricuspid stenosis. Aortic Valve: The aortic valve is  normal in structure. Aortic valve regurgitation is moderate. Aortic regurgitation PHT measures 577 msec. No aortic stenosis is present. Aortic valve mean gradient measures 4.0 mmHg. Aortic valve peak gradient measures 10.1 mmHg. Aortic valve area, by VTI measures 2.98 cm. Pulmonic Valve: The pulmonic valve was normal in structure. Pulmonic valve regurgitation is not visualized. No evidence of pulmonic stenosis. Aorta: The aortic root is normal in size and structure. Venous: The inferior vena cava is normal in size with greater than 50% respiratory variability, suggesting right atrial pressure of 3 mmHg. IAS/Shunts: No atrial level shunt detected by color flow Doppler.  LEFT VENTRICLE PLAX 2D LVIDd:         5.30 cm      Diastology LVIDs:         4.50 cm      LV e' medial:    4.38 cm/s LV PW:         1.10 cm      LV E/e' medial:  10.4 LV IVS:        2.10 cm      LV e' lateral:   7.80 cm/s LVOT diam:     2.00 cm      LV E/e' lateral: 5.8 LV SV:         86 LV SV Index:   46 LVOT Area:     3.14 cm  LV Volumes (MOD) LV vol d, MOD A2C: 70.9 ml LV vol d, MOD A4C: 108.0 ml LV vol s, MOD A2C: 34.8 ml LV vol s, MOD A4C: 64.4 ml LV SV MOD A2C:     36.1 ml LV SV MOD A4C:     108.0 ml LV SV MOD BP:      38.6 ml RIGHT VENTRICLE RV Basal diam:  2.95 cm RV Mid diam:    2.40 cm RV S prime:     15.20 cm/s TAPSE (M-mode): 3.0 cm LEFT ATRIUM             Index        RIGHT ATRIUM           Index LA diam:        3.80 cm 2.00 cm/m   RA Area:     16.70 cm LA Vol (A2C):   80.3 ml 42.30 ml/m  RA Volume:   41.40 ml  21.81 ml/m LA Vol (A4C):   80.7 ml 42.51 ml/m LA Biplane Vol: 91.6 ml 48.25 ml/m  AORTIC VALVE                    PULMONIC VALVE AV Area (Vmax):    2.67 cm     PV Vmax:       1.07 m/s AV Area (Vmean):   2.77 cm     PV Peak grad:  4.6 mmHg AV Area (VTI):     2.98 cm AV Vmax:           159.00 cm/s AV Vmean:          89.000 cm/s AV VTI:            0.290 m AV Peak Grad:      10.1 mmHg AV Mean Grad:      4.0 mmHg LVOT Vmax:          135.00 cm/s LVOT Vmean:  78.500 cm/s LVOT VTI:          0.275 m LVOT/AV VTI ratio: 0.95 AI PHT:            577 msec  AORTA Ao Root diam: 3.90 cm Ao Asc diam:  3.70 cm MITRAL VALVE               TRICUSPID VALVE MV Area (PHT): 2.93 cm    TR Peak grad:   27.0 mmHg MV Area VTI:   2.53 cm    TR Vmax:        260.00 cm/s MV Peak grad:  3.9 mmHg MV Mean grad:  2.0 mmHg    SHUNTS MV Vmax:       0.99 m/s    Systemic VTI:  0.28 m MV Vmean:      56.4 cm/s   Systemic Diam: 2.00 cm MV Decel Time: 259 msec MV E velocity: 45.40 cm/s MV A velocity: 90.60 cm/s MV E/A ratio:  0.50 Marcina Millard MD Electronically signed by Marcina Millard MD Signature Date/Time: 02/04/2023/1:27:03 PM    Final    CT ANGIO HEAD NECK W WO CM  Result Date: 02/03/2023 CLINICAL DATA:  Stroke/TIA, determine embolic source EXAM: CT ANGIOGRAPHY HEAD AND NECK WITH AND WITHOUT CONTRAST TECHNIQUE: Multidetector CT imaging of the head and neck was performed using the standard protocol during bolus administration of intravenous contrast. Multiplanar CT image reconstructions and MIPs were obtained to evaluate the vascular anatomy. Carotid stenosis measurements (when applicable) are obtained utilizing NASCET criteria, using the distal internal carotid diameter as the denominator. RADIATION DOSE REDUCTION: This exam was performed according to the departmental dose-optimization program which includes automated exposure control, adjustment of the mA and/or kV according to patient size and/or use of iterative reconstruction technique. CONTRAST:  75mL OMNIPAQUE IOHEXOL 350 MG/ML SOLN COMPARISON:  Same day brain MRI FINDINGS: CT HEAD FINDINGS Brain: No hemorrhage. No hydrocephalus. No extra-axial fluid collection. Chronic infarcts in the right parietal lobe. Sequela of moderate chronic microvascular ischemic change. No mass effect. No mass lesion. Acute left cerebellar infarcts seen on same day brain MRI are not visualized on this exam due to CT  technique. Vascular: See below Skull: Normal. Negative for fracture or focal lesion. Sinuses/Orbits: No middle ear or mastoid effusion. Paranasal sinuses are clear. Orbits are unremarkable. Other: None. Review of the MIP images confirms the above findings CTA NECK FINDINGS Aortic arch: Standard branching. Imaged portion shows no evidence of aneurysm or dissection. No significant stenosis of the major arch vessel origins. Right carotid system: No evidence of dissection, stenosis (50% or greater), or occlusion. Left carotid system: No evidence of dissection, stenosis (50% or greater), or occlusion. Vertebral arteries: Right-dominant. No evidence of dissection, stenosis (50% or greater), or occlusion. Skeleton: Negative. Other neck: Negative. Upper chest: Negative. Review of the MIP images confirms the above findings CTA HEAD FINDINGS Anterior circulation: Severe nearly occlusive stenosis of the A2 segment of the left ACA (series 11, image 8). Moderate to severe stenosis in an M2 segment of the left MCA (series 11, image 40). Moderate to severe focal stenosis in the distal M1 segment of the right MCA (series 11, image 47). Severe focal stenosis of the origin of a right M2 segment (series 11, image 37). Posterior circulation: Mild narrowing of the P2 segments of bilateral PCAs. Severe focal stenosis of the origin of the left PICA (series 11, image 70). Venous sinuses: As permitted by contrast timing, patent. Anatomic variants: None Review of the MIP  images confirms the above findings IMPRESSION: 1. No acute intracranial process. Acute left cerebellar infarcts seen on same day brain MRI are not visualized on this exam due to CT technique. 2. 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 3. Severe nearly occlusive stenosis of the A2 segment of the left ACA. 4. Moderate to severe stenosis in an M2 segment of the left MCA. 5. Moderate to severe focal stenosis in the distal M1 segment of the right MCA. 6.  Severe focal stenosis of the origin of a right M2 segment. 7. Severe focal stenosis of the origin of the left PICA. 8. No hemodynamically significant stenosis in the neck. Electronically Signed   By: Lorenza Cambridge M.D.   On: 02/03/2023 15:36   MR BRAIN WO CONTRAST  Result Date: 02/03/2023 CLINICAL DATA:  57 year old male with stroke-like symptoms. Left side weakness and slurred speech. EXAM: MRI HEAD WITHOUT CONTRAST TECHNIQUE: Multiplanar, multiecho pulse sequences of the brain and surrounding structures were obtained without intravenous contrast. COMPARISON:  Brain MRI 11/07/2020. FINDINGS: Brain: Progressive post ischemic appearing encephalomalacia in the posterior right MCA territory, evolution of cortical encephalomalacia in the right parietal lobe including the sensory strip on series 9, image 41. Encephalomalacia tracks from their toward the right occipital pole. Patchy associated mild hemosiderin. Chronic lacunar infarcts scattered in the bilateral deep gray nuclei, and the right corona radiata and also appear progressed since 2022. Multiple chronic lacunar infarcts in the pons are new since 2022. And there is evidence of developing Wallerian degeneration in the right cerebellar peduncle. A chronic lacunar infarct of the right medulla on series 8, image 5 is also increased since the prior. Multiple small chronic lacunar infarcts in the bilateral cerebellum. And there is a superimposed small subcentimeter acute infarct in the posterior left cerebellum with restricted diffusion on series 5, image 13. Faint T2 and FLAIR hyperintensity. No hemorrhage or mass effect. No other restricted diffusion. No midline shift, mass effect, evidence of mass lesion, ventriculomegaly, extra-axial collection or acute intracranial hemorrhage. Cervicomedullary junction and pituitary are within normal limits. Vascular: Major intracranial vascular flow voids are stable. Generalized intracranial artery tortuosity. Skull and  upper cervical spine: Negative for age visible upper cervical spine. Normal background bone marrow signal. Sinuses/Orbits: Stable, negative. Other: Mastoids remain clear. Visible internal auditory structures appear normal. Negative visible scalp and face. IMPRESSION: 1. Acute on severe chronic small vessel disease, in the form of a tiny linear acute infarct in the posterior left cerebellum. No associated hemorrhage or mass effect. 2. Advanced chronic small vessel ischemia appears significantly progressed since the 2022 MRI in the bilateral deep gray nuclei and brainstem. And posterior right MCA territory ischemic encephalomalacia has progressed since that time Electronically Signed   By: Odessa Fleming M.D.   On: 02/03/2023 10:49    Microbiology: Results for orders placed or performed during the hospital encounter of 11/13/18  SARS Coronavirus 2 (CEPHEID - Performed in [facility] hospital lab), Hosp Order     Status: None   Collection Time: 11/13/18  2:32 PM   Specimen: Nasopharyngeal Swab  Result Value Ref Range Status   SARS Coronavirus 2 NEGATIVE NEGATIVE Final    Comment: (NOTE) If result is NEGATIVE SARS-CoV-2 target nucleic acids are NOT DETECTED. The SARS-CoV-2 RNA is generally detectable in upper and lower  respiratory specimens during the acute phase of infection. The lowest  concentration of SARS-CoV-2 viral copies this assay can detect is 250  copies / mL. A negative result  does not preclude SARS-CoV-2 infection  and should not be used as the sole basis for treatment or other  patient management decisions.  A negative result may occur with  improper specimen collection / handling, submission of specimen other  than nasopharyngeal swab, presence of viral mutation(s) within the  areas targeted by this assay, and inadequate number of viral copies  (<250 copies / mL). A negative result must be combined with clinical  observations, patient history, and epidemiological information. If result is  POSITIVE SARS-CoV-2 target nucleic acids are DETECTED. The SARS-CoV-2 RNA is generally detectable in upper and lower  respiratory specimens dur ing the acute phase of infection.  Positive  results are indicative of active infection with SARS-CoV-2.  Clinical  correlation with patient history and other diagnostic information is  necessary to determine patient infection status.  Positive results do  not rule out bacterial infection or co-infection with other viruses. If result is PRESUMPTIVE POSTIVE SARS-CoV-2 nucleic acids MAY BE PRESENT.   A presumptive positive result was obtained on the submitted specimen  and confirmed on repeat testing.  While 2019 novel coronavirus  (SARS-CoV-2) nucleic acids may be present in the submitted sample  additional confirmatory testing may be necessary for epidemiological  and / or clinical management purposes  to differentiate between  SARS-CoV-2 and other Sarbecovirus currently known to infect humans.  If clinically indicated additional testing with an alternate test  methodology [id number]) is advised. The SARS-CoV-2 RNA is generally  detectable in upper and lower respiratory sp ecimens during the acute  phase of infection. The expected result is Negative. Fact Sheet for Patients:  BoilerBrush.com.cy Fact Sheet for Healthcare Providers: https://pope.com/ This test is not yet approved or cleared by the [place] FDA and has been authorized for detection and/or diagnosis of SARS-CoV-2 by FDA under an Emergency Use Authorization (EUA).  This EUA will remain in effect (meaning this test can be used) for the duration of the COVID-19 declaration under Section 564(b)(1) of the Act, 21 U.S.C. section 360bbb-3(b)(1), unless the authorization is terminated or revoked sooner. Performed at [facility], [address] [place], [place] [address]     Labs: CBC: Recent Labs  Lab 02/03/23 0924   WBC 9.3  NEUTROABS 5.4  HGB 16.3  HCT 48.4  MCV 91.8  PLT 275   Basic Metabolic Panel: Recent Labs  Lab 02/03/23 0924  NA 137  K 4.0  CL 101  CO2 27  GLUCOSE 80  BUN 16  CREATININE 1.07  CALCIUM 8.9   Liver Function Tests: Recent Labs  Lab 02/03/23 0924  AST 20  ALT 8  ALKPHOS 94  BILITOT 1.6*  PROT 7.7  ALBUMIN 4.1   CBG: No results for input(s): "GLUCAP" in the last 168 hours.  Discharge time spent: greater than 30 minutes.  Signed: Alford Highland, MD [facility] 02/04/2023

## 2023-02-04 NOTE — Evaluation (Signed)
Physical Therapy Evaluation Patient Details Name: JERRIE GULLO MRN: [id number] DOB: 1965-05-01 Today's Date: 02/04/2023  History of Present Illness  57 y.o. male with PMHx of CAD s/p stenting, remote history of cocaine abuse, HTN, HLD, tobacco abuse, history of CVAs in the past presenting with weakness, mult falls. Per report, pt with worsening slurred speech and confusion over multiple days. MRI of the brain with acute on severe chronic tiny linear infarcts in the posterior left cerebellum.  Clinical Impression   Pt is received in bed, he is agreeable to PT session. At baseline, Pt reports furniture walking with multiple falls due to unsteadiness, requires assist with IADLs from family/friends, and use of transportation services due to not driving. At beginning of session, BP was assessed at 149/96 with no reports of dizziness. Pt performs bed mobility mod I, transfers and amb CGA for safety. Pt able to perform static standing exercises to assess standing balance and noted LOB during tandem stance with ability to self-correct following stepping strategy. Additionally, Pt demonstrates grossly 4+/5 BLE strength except for B hip flexion 3/5. Pt able to amb approx 200 ft using RW with cuing to maintain AD closer to trunk. Educated Pt on using RW instead of furniture walking to decrease falls-Pt verbalized understanding. Pt would benefit from skilled PT to address above deficits and promote optimal return to PLOF.      If plan is discharge home, recommend the following: A little help with walking and/or transfers;Assistance with cooking/housework;Assist for transportation;Help with stairs or ramp for entrance   Can travel by private vehicle        Equipment Recommendations Rolling walker (2 wheels)  Recommendations for Other Services       Functional Status Assessment Patient has had a recent decline in their functional status and demonstrates the ability to make significant improvements in  function in a reasonable and predictable amount of time.     Precautions / Restrictions Precautions Precautions: Fall Restrictions Weight Bearing Restrictions: No      Mobility  Bed Mobility Overal bed mobility: Modified Independent             General bed mobility comments: elevated HOB and use of hand railings to complete task; no cuing required    Transfers Overall transfer level: Needs assistance Equipment used: None Transfers: Sit to/from Stand, Bed to chair/wheelchair/BSC Sit to Stand: Contact guard assist   Step pivot transfers: Contact guard assist       General transfer comment: able to perform STS without AD with reports of mild dizziness which improved with increased time, CGA for safety; able to perform SPT from bed to recliner at end of session without AD and no reports of dizziness    Ambulation/Gait Ambulation/Gait assistance: Contact guard assist Gait Distance (Feet): 200 Feet Assistive device: Rolling walker (2 wheels) Gait Pattern/deviations: WFL(Within Functional Limits), Step-through pattern, Decreased stride length Gait velocity: slightly decreased     General Gait Details: frequent cuing for maintaining AD close to trunk to prevent falls; performed head turns R/L/up/down during amb to assess balance with slight decrease gait speed but no reports of difficulty  Stairs            Wheelchair Mobility     Tilt Bed    Modified Rankin (Stroke Patients Only)       Balance Overall balance assessment: Needs assistance Sitting-balance support: Feet supported Sitting balance-Leahy Scale: Good Sitting balance - Comments: able to maintain seated EOB balance during functional activities   Standing  balance support: No upper extremity supported Standing balance-Leahy Scale: Fair Standing balance comment: able to maintain static standing balance without LOB noted; tested balance in romberg, semi-tandem, and tandem with notable LOB during  tandem stance for 30 sec                             Pertinent Vitals/Pain Pain Assessment Pain Assessment: No/denies pain    Home Living Family/patient expects to be discharged to:: Private residence Living Arrangements: Alone Available Help at Discharge: Neighbor;Family;Available PRN/intermittently (Pt reports brother comes to check in) Type of Home: Apartment Home Access: Level entry         Home Equipment: Cane - single point Additional Comments: Pt reports friend can lend him a rollator if necessary    Prior Function Prior Level of Function : Independent/Modified Independent;History of Falls (last six months)             Mobility Comments: reporst he furniture/wall surfs at home; used a cane but lost it at a grocery store ADLs Comments: IND with ADL, brother helps with IADLs when he checks in; has neighbor with B [employer] who also helps him     Extremity/Trunk Assessment   Upper Extremity Assessment Upper Extremity Assessment: Defer to OT evaluation;Generalized weakness LUE Deficits / Details: L grip strength weakness and slight/mild FMC deficits LUE Coordination: decreased fine motor    Lower Extremity Assessment Lower Extremity Assessment: RLE deficits/detail;Generalized weakness;LLE deficits/detail RLE Deficits / Details: grossly 4+/5 except for hip flexion 3/5 RLE Sensation: WNL RLE Coordination: WNL LLE Deficits / Details: grossly 4+/5 except for hip flexion 3/5 LLE Sensation: WNL LLE Coordination: WNL       Communication   Communication Communication: No apparent difficulties Cueing Techniques: Verbal cues  Cognition Arousal: Alert Behavior During Therapy: WFL for tasks assessed/performed Overall Cognitive Status: Within Functional Limits for tasks assessed                                 General Comments: AO x4; pleasant and cooperative with PT        General Comments      Exercises Other Exercises Other Exercises:  Edu Pt on safety with use of AD instead of furniture walking to prevent falls   Assessment/Plan    PT Assessment Patient needs continued PT services  PT Problem List Decreased strength;Decreased mobility;Decreased range of motion;Decreased activity tolerance;Decreased balance;Decreased coordination       PT Treatment Interventions DME instruction;Therapeutic exercise;Gait training;Functional mobility training;Therapeutic activities    PT Goals (Current goals can be found in the Care Plan section)  Acute Rehab PT Goals Patient Stated Goal: to go home PT Goal Formulation: With patient Time For Goal Achievement: 02/18/23 Potential to Achieve Goals: Good    Frequency Min 1X/week     Co-evaluation               AM-PAC PT "6 Clicks" Mobility  Outcome Measure Help needed turning from your back to your side while in a flat bed without using bedrails?: None Help needed moving from lying on your back to sitting on the side of a flat bed without using bedrails?: None Help needed moving to and from a bed to a chair (including a wheelchair)?: A Little Help needed standing up from a chair using your arms (e.g., wheelchair or bedside chair)?: A Little Help needed to walk in hospital room?:  A Little Help needed climbing 3-5 steps with a railing? : A Little 6 Click Score: 20    End of Session Equipment Utilized During Treatment: Gait belt Activity Tolerance: Patient tolerated treatment well Patient left: in chair;with call bell/phone within reach;with chair alarm set Nurse Communication: Mobility status PT Visit Diagnosis: Unsteadiness on feet (R26.81);Muscle weakness (generalized) (M62.81);History of falling (Z91.81);Repeated falls (R29.6)    Time: 1610-9604 PT Time Calculation (min) (ACUTE ONLY): 25 min   Charges:                 Elmon Else, SPT   Krista Godsil 02/04/2023, 10:51 AM

## 2023-02-04 NOTE — Hospital Course (Signed)
57 year old man past medical history of CAD, hypertension, crack cocaine use, hypertension, hyperlipidemia, tobacco abuse, history of CVA.  Patient presenting with unsteady gait and falling over and some slurred speech.  Patient not taking any medications at home.  10/11.  MRI of the brain shows a acute infarct in the left posterior left cerebellum, posterior right MCA territory encephalomalacia which has progressed since the last imaging, advanced chronic small vessel ischemia.  CT angio head and neck showed no acute intracranial process cute left cerebellar infarct are not visualized on this exam.  Severely near occlusive stenosis of the origin of the left AICA, severely nearly occlusive stenosis at A2 segment of the left ACA, moderate to severe stenosis in M2 segment of the left MCA, moderate to severe focal stenosis of distal M1 segment of the right MCA, severe focal stenosis of the origin of the left PICA.  Echocardiogram did not show a source of the stroke but EF of 40%.  Neurology recommended outpatient cardiac monitoring which Dr. Glennis Brink team was able to set up prior to discharge.   Physical therapy and Occupational Therapy recommending home health.  Patient wanted to do outpatient therapy.

## 2023-02-04 NOTE — Progress Notes (Signed)
NEUROLOGY CONSULT FOLLOW UP NOTE   Date of service: February 04, 2023 Patient Name: Kenneth Morales MRN:  [id number] DOB:  02/28/1966  Brief HPI  Kenneth Morales is a 57 y.o. male  has a past medical history of Acute MI (HCC) (MAY 2013), CAD (coronary artery disease), Cocaine abuse (HCC), Depression, Hyperlipidemia, Hypertension, Spider bite, and Tobacco abuse. who presented with complaint of progressive weakness and falls.  Patient has had multiple strokes in the past.  Since December has been progressively declining, becoming more weak and having falls.    Interval Hx/subjective   Stable since admission.  No new neurological complaints. Vitals   Vitals:   02/03/23 1954 02/03/23 2353 02/04/23 0353 02/04/23 0820  BP: (!) 141/87 (!) 151/107 (!) 176/104 (!) 154/107  Pulse: 72 94 69 71  Resp: 16 12 20 19   Temp: 98.4 F (36.9 C) 97.8 F (36.6 C) 98.3 F (36.8 C) 98.2 F (36.8 C)  TempSrc: Oral Oral Oral Oral  SpO2: 99% 94% 99% 93%  Weight:      Height:         Body mass index is 22.96 kg/m.  Physical Exam   Constitutional: Appears well-developed and well-nourished.  Psych: Affect appropriate to situation.  Eyes: No scleral injection.  Head: Normocephalic.  Cardiovascular: Normal rate and regular rhythm.  Respiratory: Effort normal, non-labored breathing.    Neurologic Examination   Mental Status: Alert, oriented, thought content appropriate.  Speech fluent without evidence of aphasia.  Able to follow 3 step commands without difficulty. Cranial Nerves: II: Discs flat bilaterally; Visual fields grossly normal, pupils equal, round, reactive to light and accommodation III,IV, VI: ptosis not present, extra-ocular motions intact bilaterally V,VII: decrease in right NLF, facial light touch sensation normal bilaterally VIII: hearing normal bilaterally XI: bilateral shoulder shrug XII: midline tongue extension Motor: 5/5 throughout Sensory: Pinprick and light touch intact  throughout, bilaterally Deep Tendon Reflexes: Symmetric throughout Plantars: Right: mute                              Left: mute Cerebellar: normal finger-to-nose and normal heel-to-shin testing bilaterally Gait: not tested due to safety concerns    Labs and Diagnostic Imaging   CBC:  Recent Labs  Lab 02/03/23 0924  WBC 9.3  NEUTROABS 5.4  HGB 16.3  HCT 48.4  MCV 91.8  PLT 275    Basic Metabolic Panel:  Lab Results  Component Value Date   NA 137 02/03/2023   K 4.0 02/03/2023   CO2 27 02/03/2023   GLUCOSE 80 02/03/2023   BUN 16 02/03/2023   CREATININE 1.07 02/03/2023   CALCIUM 8.9 02/03/2023   GFRNONAA >60 02/03/2023   GFRAA >60 11/14/2018   Lipid Panel:  Lab Results  Component Value Date   LDLCALC 137 (H) 02/04/2023   HgbA1c:  Lab Results  Component Value Date   HGBA1C 5.5 02/03/2023   Urine Drug Screen:     Component Value Date/Time   LABOPIA NONE DETECTED 02/04/2023 0035   LABOPIA POSITIVE (A) 01/27/2018 1531   COCAINSCRNUR POSITIVE (A) 02/04/2023 0035   LABBENZ NONE DETECTED 02/04/2023 0035   LABBENZ NONE DETECTED 01/27/2018 1531   AMPHETMU NONE DETECTED 02/04/2023 0035   AMPHETMU NONE DETECTED 01/27/2018 1531   THCU NONE DETECTED 02/04/2023 0035   THCU NONE DETECTED 01/27/2018 1531   LABBARB NONE DETECTED 02/04/2023 0035   LABBARB NONE DETECTED 01/27/2018 1531    Alcohol Level  Component Value Date/Time   ETH <10 02/03/2023 0924   INR  Lab Results  Component Value Date   INR 1.0 02/03/2023   APTT  Lab Results  Component Value Date   APTT 31 02/03/2023   AED levels: No results found for: "PHENYTOIN", "ZONISAMIDE", "LAMOTRIGINE", "LEVETIRACETA"  CT ANGIOGRAPHY HEAD AND NECK WITH AND WITHOUT CONTRAST (personally reviewed)   TECHNIQUE: Multidetector CT imaging of the head and neck was performed using the standard protocol during bolus administration of intravenous contrast. Multiplanar CT image reconstructions and MIPs  were obtained to evaluate the vascular anatomy. Carotid stenosis measurements (when applicable) are obtained utilizing NASCET criteria, using the distal internal carotid diameter as the denominator.   RADIATION DOSE REDUCTION: This exam was performed according to the departmental dose-optimization program which includes automated exposure control, adjustment of the mA and/or kV according to patient size and/or use of iterative reconstruction technique.   CONTRAST:  75mL OMNIPAQUE IOHEXOL 350 MG/ML SOLN   COMPARISON:  Same day brain MRI   FINDINGS: CT HEAD FINDINGS   Brain: No hemorrhage. No hydrocephalus. No extra-axial fluid collection. Chronic infarcts in the right parietal lobe. Sequela of moderate chronic microvascular ischemic change. No mass effect. No mass lesion. Acute left cerebellar infarcts seen on same day brain MRI are not visualized on this exam due to CT technique.   Vascular: See below   Skull: Normal. Negative for fracture or focal lesion.   Sinuses/Orbits: No middle ear or mastoid effusion. Paranasal sinuses are clear. Orbits are unremarkable.   Other: None.   Review of the MIP images confirms the above findings   CTA NECK FINDINGS   Aortic arch: Standard branching. Imaged portion shows no evidence of aneurysm or dissection. No significant stenosis of the major arch vessel origins.   Right carotid system: No evidence of dissection, stenosis (50% or greater), or occlusion.   Left carotid system: No evidence of dissection, stenosis (50% or greater), or occlusion.   Vertebral arteries: Right-dominant. No evidence of dissection, stenosis (50% or greater), or occlusion.   Skeleton: Negative.   Other neck: Negative.   Upper chest: Negative.   Review of the MIP images confirms the above findings   CTA HEAD FINDINGS   Anterior circulation: Severe nearly occlusive stenosis of the A2 segment of the left ACA (series 11, image 8). Moderate to  severe stenosis in an M2 segment of the left MCA (series 11, image 40). Moderate to severe focal stenosis in the distal M1 segment of the right MCA (series 11, image 47). Severe focal stenosis of the origin of a right M2 segment (series 11, image 37).   Posterior circulation: Mild narrowing of the P2 segments of bilateral PCAs. Severe focal stenosis of the origin of the left PICA (series 11, image 70).   Venous sinuses: As permitted by contrast timing, patent.   Anatomic variants: None   Review of the MIP images confirms the above findings   IMPRESSION: 1. No acute intracranial process. Acute left cerebellar infarcts seen on same day brain MRI are not visualized on this exam due to CT technique. 2. 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 3. Severe nearly occlusive stenosis of the A2 segment of the left ACA. 4. Moderate to severe stenosis in an M2 segment of the left MCA. 5. Moderate to severe focal stenosis in the distal M1 segment of the right MCA. 6. Severe focal stenosis of the origin of a right M2 segment. 7. Severe focal stenosis  of the origin of the left PICA. 8. No hemodynamically significant stenosis in the neck.  Impression   Kenneth Morales is a 57 y.o. male with a past medical history of Acute MI (HCC) (MAY 2013), CAD (coronary artery disease), Cocaine abuse (HCC), Depression, Hyperlipidemia, Hypertension, Spider bite, and Tobacco abuse. who presented with complaint of progressive weakness and falls.  Patient has had multiple strokes in the past.  Since December has been progressively declining, becoming more weak and having falls.  Small acute infarct noted on MRI imaging.  Echocardiogram is pending.  Continues to smoke.  UDS positive for cocaine.  A1c 5.5, LDL 137. CTA of the head and neck shows multiple areas bilaterally of moderate to severe stenosis.  Infarct likely secondary to small vessel disease secondary to poor vascular risk factor  control.  Can not rule out an embolic component as well.    Recommendations  Compliance with DAPT and statin stressed Discontinuation of smoking and illicit drug use stressed Echocardiogram pending.  Patient to be scheduled for a holter on an outpatient basis after discharge If echocardiogram is unremarkable, patient stable neurologically for discharge with follow up with neurology and cardiology on an outpatient basis.   ______________________________________________________________________   Thank you for the opportunity to take part in the care of this patient. If you have any further questions, please contact the neurology consultation team on call. Updated oncall schedule is listed on AMION.  Signed,  Tobenna Needs

## 2023-02-04 NOTE — Progress Notes (Signed)
Speech Language Pathology Evaluation Patient Details Name: JAEL KOSTICK MRN: [id number] DOB: 1965-12-10 Today's Date: 02/04/2023 Time: 4401-0272 SLP Time Calculation (min) (ACUTE ONLY): 20 min  Problem List:  Patient Active Problem List   Diagnosis Date Noted   CVA (cerebral vascular accident) (HCC) 02/03/2023   Ataxia, post-stroke    Gait disturbance, post-stroke    Intractable hiccoughs    Cerebellar infarction (HCC) 07/07/2018   Stroke (cerebrum) (HCC) 07/04/2018   Unstable angina (HCC) 01/26/2018   NSTEMI (non-ST elevated myocardial infarction) (HCC) 11/28/2017   Malignant essential hypertension 07/01/2016   Chest pain 07/01/2016   SOB (shortness of breath) 12/10/2011   CAD (coronary artery disease)    Hyperlipidemia    Hypertension    Tobacco abuse    Cocaine abuse (HCC)    Past Medical History:  Past Medical History:  Diagnosis Date   Acute MI (HCC) MAY 2013   CAD (coronary artery disease)    NSTEMI in 05/13 in setting of Cocaine use. Cath: 99% mid LCX stenosis with a large thrombus. PCI and BMS (4.0 X15 mm Vision) placement to mid LCX, LAD: 20%, RCA: 30%, EF: 60%.    Cocaine abuse (HCC)    quit in 08/2011   Depression    Hyperlipidemia    Hypertension    Spider bite    Tobacco abuse    Past Surgical History:  Past Surgical History:  Procedure Laterality Date   CARDIAC CATHETERIZATION  MAY 2013   s/p stent @ [facility]   CARDIAC CATHETERIZATION     CORONARY CTO INTERVENTION N/A 01/27/2018   Procedure: CORONARY CTO INTERVENTION;  Surgeon: Corky Crafts, MD;  Location: [facility] INVASIVE CV LAB;  Service: Cardiovascular;  Laterality: N/A;   CORONARY STENT INTERVENTION N/A 11/29/2017   Procedure: CORONARY STENT INTERVENTION;  Surgeon: Alwyn Pea, MD;  Location: [facility] INVASIVE CV LAB;  Service: Cardiovascular;  Laterality: N/A;   CORONARY STENT INTERVENTION N/A 01/27/2018   Procedure: CORONARY STENT INTERVENTION;  Surgeon: Corky Crafts, MD;  Location: [facility]  INVASIVE CV LAB;  Service: Cardiovascular;  Laterality: N/A;  mid cfx   HEMORRHOID SURGERY     LEFT HEART CATH AND CORONARY ANGIOGRAPHY Right 11/29/2017   Procedure: Left heart catheterization with possible PCI;  Surgeon: Laurier Nancy, MD;  Location: [facility] INVASIVE CV LAB;  Service: Cardiovascular;  Laterality: Right;   LEFT HEART CATH AND CORONARY ANGIOGRAPHY N/A 01/27/2018   Procedure: LEFT HEART CATH AND CORONARY ANGIOGRAPHY;  Surgeon: Corky Crafts, MD;  Location: [facility] INVASIVE CV LAB;  Service: Cardiovascular;  Laterality: N/A;   LEFT HEART CATH AND CORONARY ANGIOGRAPHY N/A 11/15/2018   Procedure: LEFT HEART CATH AND CORONARY ANGIOGRAPHY and possible pci and stent;  Surgeon: Alwyn Pea, MD;  Location: [facility] INVASIVE CV LAB;  Service: Cardiovascular;  Laterality: N/A;   HPI:  57 y.o. male with PMHx of CAD s/p stenting, remote history of cocaine abuse, HTN, HLD, tobacco abuse, history of CVAs in the past presenting with weakness, mult falls. Per report, pt with worsening slurred speech and confusion over multiple days. MRI of the brain with acute on severe chronic tiny linear infarcts in the posterior left cerebellum.   Assessment / Plan / Recommendation Clinical Impression  Pt presents with mild dysarthria, c/b reduced articulatory precision with increased rate of speech. Additional presentation of hoarseness which pt and friend endorse is change in vocal quality since acute onset. Pt cued for slow rate of speech using over-articulation. Effective for increased intelligibility. SLUMS suggests  cognitive impairments with SLP noting challenges in areas of immediate and delayed recall and executive function. Pt denies changes in cognitive function and friend present unable to determine if changes have been observed. Pt would benefit from skilled ST at next venue of care to address mild dysarthria and potential cognitive impairments to facilitate return to baseline and increased independence.  Pt appears functional for current medical environment. Is understanding of his medical course, is fully oriented, able to communicate thoughts/ideas/needs. Recommend pt have supervision for complex cognitive tasks. Results and recommendations communicated to pt and friend. Both verbalize understanding and deny questions at conclusion of session.    SLP Assessment  SLP Recommendation/Assessment: All further Speech Lanaguage Pathology  needs can be addressed in the next venue of care SLP Visit Diagnosis: Dysarthria and anarthria (R47.1);Cognitive communication deficit (R41.841)    Recommendations for follow up therapy are one component of a multi-disciplinary discharge planning process, led by the attending physician.  Recommendations may be updated based on patient status, additional functional criteria and insurance authorization.    Follow Up Recommendations  Outpatient SLP    Assistance Recommended at Discharge  PRN  Functional Status Assessment Patient has had a recent decline in their functional status and demonstrates the ability to make significant improvements in function in a reasonable and predictable amount of time.  Frequency and Duration           SLP Evaluation Cognition  Overall Cognitive Status: Impaired/Different from baseline Arousal/Alertness: Awake/alert Orientation Level: Oriented X4 Year: 2024 Month: October Day of Week: Correct Attention: Sustained Sustained Attention: Impaired Sustained Attention Impairment: Functional basic;Functional complex Memory: Impaired Memory Impairment: Storage deficit;Retrieval deficit;Decreased short term memory Decreased Short Term Memory: Verbal complex Awareness: Impaired Awareness Impairment: Intellectual impairment Executive Function: Sequencing;Organizing;Self Monitoring;Self Correcting Sequencing: Impaired Organizing: Impaired Self Monitoring: Impaired Behaviors: Restless Safety/Judgment: Appears intact Comments: SLUMS  completed with pt scoring 21/30 (WNL is equal to or greater than27). Pt with challenges in immediate and delayed recall of words presented and during reverse numeracy task. Unable to complete clock drawing. Re-cueing for filling in all numbers unsuccessful in completion of task, hands not centered, numbers 10, 11 only, written outside circle with equal hands pointing towards.       Comprehension    Within Functional Limits for tasks assessed    Expression   Within Functional Limits for tasks assessed   Oral / Motor  Motor Speech Overall Motor Speech: Impaired Respiration: Within functional limits Phonation: Hoarse Resonance: Within functional limits Articulation: Impaired Level of Impairment: Sentence Intelligibility: Intelligibility reduced Word: 75-100% accurate Phrase: Not tested Sentence: Not tested Conversation: Other (comment) (90% to careful listener in quiet enviornment) Motor Planning: Witnin functional limits Motor Speech Errors: Not applicable Interfering Components: Premorbid status (per pt report, decreased intelligibility at baseline, which friend supports. Both reprot exacerbation.) Effective Techniques: Slow rate;Over-articulate            Maia Breslow, CCC-SLP 02/04/2023, 12:13 PM

## 2023-02-07 NOTE — ED Notes (Signed)
Swallow screen was completed before the previous meds given at 0920. Documented after swallow scale performed

## 2023-02-11 ENCOUNTER — Telehealth: Payer: Self-pay

## 2023-02-22 ENCOUNTER — Telehealth: Payer: Self-pay

## 2023-03-21 ENCOUNTER — Telehealth: Payer: Self-pay

## 2023-04-16 ENCOUNTER — Other Ambulatory Visit: Payer: Self-pay

## 2023-04-16 ENCOUNTER — Emergency Department: Payer: Medicaid Other

## 2023-04-16 ENCOUNTER — Emergency Department
Admission: EM | Admit: 2023-04-16 | Discharge: 2023-04-16 | Disposition: A | Discharge status: Home / Self Care | Payer: Medicaid Other | Attending: Emergency Medicine | Admitting: Emergency Medicine

## 2023-04-16 DIAGNOSIS — R4781 Slurred speech: Secondary | ICD-10-CM | POA: Diagnosis present

## 2023-04-16 DIAGNOSIS — I2510 Atherosclerotic heart disease of native coronary artery without angina pectoris: Secondary | ICD-10-CM | POA: Diagnosis not present

## 2023-04-16 DIAGNOSIS — I10 Essential (primary) hypertension: Secondary | ICD-10-CM | POA: Diagnosis not present

## 2023-04-16 DIAGNOSIS — I639 Cerebral infarction, unspecified: Secondary | ICD-10-CM | POA: Diagnosis not present

## 2023-04-16 DIAGNOSIS — R42 Dizziness and giddiness: Secondary | ICD-10-CM

## 2023-04-16 DIAGNOSIS — R4701 Aphasia: Secondary | ICD-10-CM | POA: Diagnosis not present

## 2023-04-16 LAB — CBC
HCT: 44.7 % (ref 39.0–52.0)
Hemoglobin: 15.4 g/dL (ref 13.0–17.0)
MCH: 31.5 pg (ref 26.0–34.0)
MCHC: 34.5 g/dL (ref 30.0–36.0)
MCV: 91.4 fL (ref 80.0–100.0)
Platelets: 257 10*3/uL (ref 150–400)
RBC: 4.89 MIL/uL (ref 4.22–5.81)
RDW: 13.1 % (ref 11.5–15.5)
WBC: 9.5 10*3/uL (ref 4.0–10.5)
nRBC: 0 % (ref 0.0–0.2)

## 2023-04-16 LAB — COMPREHENSIVE METABOLIC PANEL
ALT: 7 U/L (ref 0–44)
AST: 14 U/L — ABNORMAL LOW (ref 15–41)
Albumin: 4.1 g/dL (ref 3.5–5.0)
Alkaline Phosphatase: 86 U/L (ref 38–126)
Anion gap: 9 (ref 5–15)
BUN: 13 mg/dL (ref 6–20)
CO2: 27 mmol/L (ref 22–32)
Calcium: 9.3 mg/dL (ref 8.9–10.3)
Chloride: 102 mmol/L (ref 98–111)
Creatinine, Ser: 1.33 mg/dL — ABNORMAL HIGH (ref 0.61–1.24)
GFR, Estimated: >60 mL/min (ref >60)
Glucose, Bld: 94 mg/dL (ref 70–99)
Potassium: 3.4 mmol/L — ABNORMAL LOW (ref 3.5–5.1)
Sodium: 138 mmol/L (ref 135–145)
Total Bilirubin: 1 mg/dL (ref <1.2)
Total Protein: 7.8 g/dL (ref 6.5–8.1)

## 2023-04-16 LAB — DIFFERENTIAL
Abs Immature Granulocytes: 0.02 10*3/uL (ref 0.00–0.07)
Basophils Absolute: 0 10*3/uL (ref 0.0–0.1)
Basophils Relative: 0 %
Eosinophils Absolute: 0.1 10*3/uL (ref 0.0–0.5)
Eosinophils Relative: 1 %
Immature Granulocytes: 0 %
Lymphocytes Relative: 33 %
Lymphs Abs: 3.1 10*3/uL (ref 0.7–4.0)
Monocytes Absolute: 0.7 10*3/uL (ref 0.1–1.0)
Monocytes Relative: 7 %
Neutro Abs: 5.5 10*3/uL (ref 1.7–7.7)
Neutrophils Relative %: 59 %

## 2023-04-16 LAB — ETHANOL: Alcohol, Ethyl (B): <10 mg/dL (ref <10)

## 2023-04-16 LAB — APTT: aPTT: 34 s (ref 24–36)

## 2023-04-16 LAB — PROTIME-INR
INR: 1 (ref 0.8–1.2)
Prothrombin Time: 13 s (ref 11.4–15.2)

## 2023-04-16 LAB — CBG MONITORING, ED: Glucose-Capillary: 82 mg/dL (ref 70–99)

## 2023-04-16 MED ORDER — SODIUM CHLORIDE 0.9% FLUSH
3.0000 mL | Freq: Once | INTRAVENOUS | Status: AC
  Administered 2023-04-16: 3 mL via INTRAVENOUS

## 2023-04-16 MED ORDER — ACETAMINOPHEN 500 MG PO TABS
1000.0000 mg | ORAL_TABLET | Freq: Once | ORAL | Status: AC
  Administered 2023-04-16: 1000 mg via ORAL
  Filled 2023-04-16: qty 2

## 2023-04-16 MED ORDER — CLOPIDOGREL BISULFATE 75 MG PO TABS
75.0000 mg | ORAL_TABLET | Freq: Once | ORAL | Status: AC
  Administered 2023-04-16: 75 mg via ORAL
  Filled 2023-04-16: qty 1

## 2023-04-16 MED ORDER — ASPIRIN 81 MG PO CHEW
81.0000 mg | CHEWABLE_TABLET | Freq: Once | ORAL | Status: AC
Start: 2023-04-16 — End: 2023-04-16
  Administered 2023-04-16: 81 mg via ORAL
  Filled 2023-04-16: qty 1

## 2023-04-16 NOTE — Consult Note (Signed)
CODE STROKE- PHARMACY COMMUNICATION   Time CODE STROKE called/page received:1950  Time response to CODE STROKE was made (in person or via phone):   Time Stroke Kit retrieved from [employer] (only if needed):2000  No TNK, stroke in the last 3 months.   Past Medical History:  Diagnosis Date   Acute MI [facility]) MAY 2013   CAD (coronary artery disease)    NSTEMI in 05/13 in setting of Cocaine use. Cath: 99% mid LCX stenosis with a large thrombus. PCI and BMS (4.0 X15 mm Vision) placement to mid LCX, LAD: 20%, RCA: 30%, EF: 60%.    Cocaine abuse (HCC)    quit in 08/2011   Depression    Hyperlipidemia    Hypertension    Spider bite    Tobacco abuse    Prior to Admission medications   Medication Sig Start Date End Date Taking? Authorizing Provider  acetaminophen (TYLENOL) 325 MG tablet Take 2 tablets (650 mg total) by mouth every 4 (four) hours as needed for mild pain (or temp > 37.5 C (99.5 F)). 07/14/18   Angiulli, Mcarthur Rossetti, PA-C  aspirin 81 MG tablet Take 1 tablet (81 mg total) by mouth daily. 02/04/23   Alford Highland, MD  atorvastatin (LIPITOR) 80 MG tablet Take 1 tablet (80 mg total) by mouth daily at 6 PM. 02/04/23   Alford Highland, MD  clopidogrel (PLAVIX) 75 MG tablet Take 1 tablet (75 mg total) by mouth daily. 02/04/23   Alford Highland, MD  feeding supplement (ENSURE ENLIVE / ENSURE PLUS) LIQD Take 237 mLs by mouth 2 (two) times daily between meals. 02/04/23   Alford Highland, MD  losartan (COZAAR) 25 MG tablet Take 1 tablet (25 mg total) by mouth at bedtime. 02/04/23   Alford Highland, MD  metoprolol succinate (TOPROL-XL) 25 MG 24 hr tablet Take 1 tablet (25 mg total) by mouth daily. 02/04/23   Alford Highland, MD  nicotine (NICODERM CQ - DOSED IN MG/24 HOURS) 14 mg/24hr patch One 14 mg patch chest wall daily (okay to substitute generic) 02/05/23   Alford Highland, MD  nitroGLYCERIN (NITROSTAT) 0.4 MG SL tablet Place 1 tablet (0.4mg  total) under the tongue every 5 minutes as  needed for chest pain. May take 3 doses. 02/04/23   Alford Highland, MD    Ronnald Ramp ,PharmD Clinical Pharmacist  04/16/2023  8:17 AM

## 2023-04-16 NOTE — Progress Notes (Signed)
Substance abuse resources and information for [facility] for [facility] assistance is attached to patients AVS.

## 2023-04-16 NOTE — ED Notes (Signed)
Pt given beverage and meal tray.

## 2023-04-16 NOTE — Consult Note (Addendum)
NEUROLOGY CONSULT NOTE   Date of service: April 16, 2023 Kenneth Morales Name: Kenneth Morales MRN:  [id number] DOB:  [birth year] Chief Complaint: "dizziness and speech difficulty" Requesting Provider: Shaune Pollack, MD  History of Present Illness  Kenneth Morales is a 57 y.o. male  has a past medical history of Acute MI (HCC) (MAY 2013), CAD (coronary artery disease), Cocaine abuse (HCC), Depression, Hyperlipidemia, Hypertension, Spider bite, and Tobacco abuse.     Kenneth Morales reports Kenneth Morales was in his usual state of health today when at 7 AM Kenneth Morales acutely felt dizzy and had trouble ambulating.  Kenneth Morales also had garbled speech.  Brother activated EMS.  Initially Kenneth Morales refused transport despite blood pressure on EMS arrival being 238/121.  However Kenneth Morales was eventually convinced to come to the ED for further evaluation.  EMS reported Kenneth Morales stated Kenneth Morales has not been taking his medications.  Kenneth Morales reports intermittent adherence secondary to memory concerns.  LKW: 7 AM Modified rankin score: 2-Slight disability-UNABLE to perform all activities but does not need assistance (memory complaints) IV Thrombolysis: No, recent stroke in Oct  EVT: No, exam not   NIHSS components Score: Comment  1a Level of Conscious 0[x]  1[]  2[]  3[]      1b LOC Questions 0[x]  1[]  2[]       1c LOC Commands 0[x]  1[]  2[]       2 Best Gaze 0[x]  1[]  2[]       3 Visual 0[x]  1[]  2[]  3[]      4 Facial Palsy 0[]  1[x]  2[]  3[]      5a Motor Arm - left 0[x]  1[]  2[]  3[]  4[]  UN[]    5b Motor Arm - Right 0[x]  1[]  2[]  3[]  4[]  UN[]    6a Motor Leg - Left 0[x]  1[]  2[]  3[]  4[]  UN[]    6b Motor Leg - Right 0[x]  1[]  2[]  3[]  4[]  UN[]    7 Limb Ataxia 0[]  1[]  2[x]  3[]  UN[]     8 Sensory 0[x]  1[]  2[]  UN[]      9 Best Language 0[x]  1[]  2[]  3[]      10 Dysarthria 0[x]  1[]  2[]  UN[]      11 Extinct. and Inattention 0[x]  1[]  2[]       TOTAL: 3       ROS  Comprehensive ROS performed and pertinent positives documented in HPI, with caveat of Kenneth Morales reporting memory concerns  Past  History   Past Medical History:  Diagnosis Date   Acute MI (HCC) MAY 2013   CAD (coronary artery disease)    NSTEMI in 05/13 in setting of Cocaine use. Cath: 99% mid LCX stenosis with a large thrombus. PCI and BMS (4.0 X15 mm Vision) placement to mid LCX, LAD: 20%, RCA: 30%, EF: 60%.    Cocaine abuse (HCC)    quit in 08/2011   Depression    Hyperlipidemia    Hypertension    Spider bite    Tobacco abuse     Past Surgical History:  Procedure Laterality Date   CARDIAC CATHETERIZATION  MAY 2013   s/p stent @ [facility]   CARDIAC CATHETERIZATION     CORONARY CTO INTERVENTION N/A 01/27/2018   Procedure: CORONARY CTO INTERVENTION;  Surgeon: Corky Crafts, MD;  Location: [facility] INVASIVE CV LAB;  Service: Cardiovascular;  Laterality: N/A;   CORONARY STENT INTERVENTION N/A 11/29/2017   Procedure: CORONARY STENT INTERVENTION;  Surgeon: Alwyn Pea, MD;  Location: [facility] INVASIVE CV LAB;  Service: Cardiovascular;  Laterality: N/A;   CORONARY STENT INTERVENTION N/A 01/27/2018   Procedure: CORONARY STENT INTERVENTION;  Surgeon: Lance Muss  S, MD;  Location: [facility] INVASIVE CV LAB;  Service: Cardiovascular;  Laterality: N/A;  mid cfx   HEMORRHOID SURGERY     LEFT HEART CATH AND CORONARY ANGIOGRAPHY Right 11/29/2017   Procedure: Left heart catheterization with possible PCI;  Surgeon: Laurier Nancy, MD;  Location: [facility] INVASIVE CV LAB;  Service: Cardiovascular;  Laterality: Right;   LEFT HEART CATH AND CORONARY ANGIOGRAPHY N/A 01/27/2018   Procedure: LEFT HEART CATH AND CORONARY ANGIOGRAPHY;  Surgeon: Corky Crafts, MD;  Location: [facility] INVASIVE CV LAB;  Service: Cardiovascular;  Laterality: N/A;   LEFT HEART CATH AND CORONARY ANGIOGRAPHY N/A 11/15/2018   Procedure: LEFT HEART CATH AND CORONARY ANGIOGRAPHY and possible pci and stent;  Surgeon: Alwyn Pea, MD;  Location: [facility] INVASIVE CV LAB;  Service: Cardiovascular;  Laterality: N/A;    Family History: Family History  Problem  Relation Age of Onset   Heart attack Maternal Grandfather    Heart attack Paternal Grandfather     Social History  reports that Kenneth Morales has been smoking cigarettes. Kenneth Morales has a 15 pack-year smoking history. Kenneth Morales has never used smokeless tobacco. Kenneth Morales reports current alcohol use of about 6.0 standard drinks of alcohol per week. Kenneth Morales reports that Kenneth Morales does not use drugs.  Allergies  Allergen Reactions   Brilinta [Ticagrelor] Shortness Of Breath   Penicillins Rash    Has Kenneth Morales had a PCN reaction causing immediate rash, facial/tongue/throat swelling, SOB or lightheadedness with hypotension: Yes Has Kenneth Morales had a PCN reaction causing severe rash involving mucus membranes or skin necrosis: No Has Kenneth Morales had a PCN reaction that required hospitalization: No Has Kenneth Morales had a PCN reaction occurring within the last 10 years: No If all of the above answers are "NO", then may proceed with Cephalosporin use.    Medications  No current facility-administered medications for this encounter.  Current Outpatient Medications:    acetaminophen (TYLENOL) 325 MG tablet, Take 2 tablets (650 mg total) by mouth every 4 (four) hours as needed for mild pain (or temp > 37.5 C (99.5 F))., Disp: , Rfl:    aspirin 81 MG tablet, Take 1 tablet (81 mg total) by mouth daily., Disp: 30 tablet, Rfl: 0   atorvastatin (LIPITOR) 80 MG tablet, Take 1 tablet (80 mg total) by mouth daily at 6 PM., Disp: 30 tablet, Rfl: 0   clopidogrel (PLAVIX) 75 MG tablet, Take 1 tablet (75 mg total) by mouth daily., Disp: 21 tablet, Rfl: 0   feeding supplement (ENSURE ENLIVE / ENSURE PLUS) LIQD, Take 237 mLs by mouth 2 (two) times daily between meals., Disp: 14220 mL, Rfl: 0   losartan (COZAAR) 25 MG tablet, Take 1 tablet (25 mg total) by mouth at bedtime., Disp: 30 tablet, Rfl: 0   metoprolol succinate (TOPROL-XL) 25 MG 24 hr tablet, Take 1 tablet (25 mg total) by mouth daily., Disp: 30 tablet, Rfl: 0   nicotine (NICODERM CQ - DOSED IN MG/24 HOURS) 14  mg/24hr patch, One 14 mg patch chest wall daily (okay to substitute generic), Disp: 28 patch, Rfl: 0   nitroGLYCERIN (NITROSTAT) 0.4 MG SL tablet, Place 1 tablet (0.4mg  total) under the tongue every 5 minutes as needed for chest pain. May take 3 doses., Disp: 30 tablet, Rfl: 1  Vitals   Vitals:   04/16/23 0833 04/16/23 0838  BP: (!) 190/117   Pulse: 71   Resp: 15   Temp: 98.4 F (36.9 C)   TempSrc: Oral   SpO2: 97%   Weight:  79.6 kg  Height:  5\' 10"  (1.778 m)    Body mass index is 25.18 kg/m.  Physical Exam   Constitutional: Appears well-developed and well-nourished.  Psych: Affect appropriate to situation.  Calm and cooperative Eyes: No scleral injection.  HENT: No OP obstruction.  Head: Normocephalic. Cardiovascular: Normal rate and regular rhythm.  Respiratory: Effort normal, non-labored breathing. GI: Soft.  No distension. There is no tenderness.  Skin: Warm dry and intact visible skin  Neurologic Examination   Neuro: Mental Status: Kenneth Morales is awake, alert, oriented to person, place, month, year, and situation. Kenneth Morales is able to give a clear and coherent history. No signs of aphasia or neglect Cranial Nerves: II: Visual Fields are full. Pupils are equal, round, and reactive to light.   III,IV, VI: EOMI without ptosis or diploplia.  V: Facial sensation is symmetric to temperature VII: Facial movement is notable for mild asymmetry of the right nasolabial fold at rest, nearly symmetric on smiling VIII: hearing is intact to voice X: Uvula elevates symmetrically XI: Shoulder shrug is symmetric. XII: tongue is midline without atrophy or fasciculations.  Motor: No pronator drift bilaterally.  No drift of the lower extremities bilaterally. Sensory: Sensation is symmetric to light touch and temperature in the arms and legs. Deep Tendon Reflexes: 2+ and symmetric in the biceps, brachioradialis and 3+ and symmetric patellae.  Cerebellar: Finger-to-nose with mild  ataxia bilaterally and heel-to-shin intact bilaterally   Labs/Imaging/Neurodiagnostic studies   CBC:  Recent Labs  Lab 05/09/2023 0803  WBC 9.5  NEUTROABS 5.5  HGB 15.4  HCT 44.7  MCV 91.4  PLT 257   Basic Metabolic Panel:  Lab Results  Component Value Date   NA 138 05/09/23   K 3.4 (L) 05-09-2023   CO2 27 05-09-2023   GLUCOSE 94 May 09, 2023   BUN 13 05/09/2023   CREATININE 1.33 (H) 05/09/2023   CALCIUM 9.3 05-09-23   GFRNONAA >60 May 09, 2023   GFRAA >60 11/14/2018   Lipid Panel:  Lab Results  Component Value Date   LDLCALC 137 (H) 02/04/2023   HgbA1c:  Lab Results  Component Value Date   HGBA1C 5.5 02/03/2023   Urine Drug Screen:     Component Value Date/Time   LABOPIA NONE DETECTED 02/04/2023 0035   LABOPIA POSITIVE (A) 01/27/2018 1531   COCAINSCRNUR POSITIVE (A) 02/04/2023 0035   LABBENZ NONE DETECTED 02/04/2023 0035   LABBENZ NONE DETECTED 01/27/2018 1531   AMPHETMU NONE DETECTED 02/04/2023 0035   AMPHETMU NONE DETECTED 01/27/2018 1531   THCU NONE DETECTED 02/04/2023 0035   THCU NONE DETECTED 01/27/2018 1531   LABBARB NONE DETECTED 02/04/2023 0035   LABBARB NONE DETECTED 01/27/2018 1531    Alcohol Level     Component Value Date/Time   ETH <10 02/03/2023 0924   INR  Lab Results  Component Value Date   INR 1.0 05/09/2023   APTT  Lab Results  Component Value Date   APTT 34 2023-05-09   AED levels: No results found for: "PHENYTOIN", "ZONISAMIDE", "LAMOTRIGINE", "LEVETIRACETA"  CT Head without contrast(Personally reviewed): No acute intracranial process  CT angio Head and Neck with contrast(02/03/2023): 1. No acute intracranial process. Acute left cerebellar infarcts seen on same day brain MRI are not visualized on this exam due to CT technique. 2. 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 3. Severe nearly occlusive stenosis of the A2 segment of the left ACA. 4. Moderate to severe stenosis in an M2 segment of the  left MCA. 5. Moderate to severe focal  stenosis in the distal M1 segment of the right MCA. 6. Severe focal stenosis of the origin of a right M2 segment. 7. Severe focal stenosis of the origin of the left PICA. 8. No hemodynamically significant stenosis in the neck.    MRI Brain(Personally reviewed 02/03/2023): 1. Acute on severe chronic small vessel disease, in the form of a tiny linear acute infarct in the posterior left cerebellum. No associated hemorrhage or mass effect. 2. Advanced chronic small vessel ischemia appears significantly progressed since the 2022 MRI in the bilateral deep gray nuclei and brainstem. And posterior right MCA territory ischemic encephalomalacia has progressed since that time  Repeat MRI brain today personally reviewed, agree with radiology:   1. Tiny acute cortical infarct within the posterior left frontal lobe (motor strip). 2. Advanced chronic small vessel ischemic disease and multiple chronic infarcts, as described. 3. Unchanged small focus of chronic encephalomalacia/gliosis within the anteroinferior right temporal lobe, which may be post-traumatic in etiology or may reflect a chronic infarct. 4. Mild right maxillary sinus mucosal thickening.   ECHO 02/04/2023  1. Left ventricular ejection fraction, by estimation, is 40 to 45%. The  left ventricle has mildly decreased function. The left ventricle has no  regional wall motion abnormalities. There is moderate left ventricular  hypertrophy. Left ventricular  diastolic parameters are consistent with Grade I diastolic dysfunction  (impaired relaxation).   2. Right ventricular systolic function is normal. The right ventricular  size is normal. There is normal pulmonary artery systolic pressure.   3. The mitral valve is normal in structure. Mild mitral valve  regurgitation. No evidence of mitral stenosis.   4. The aortic valve is normal in structure. Aortic valve regurgitation is  moderate. No aortic  stenosis is present.   5. The inferior vena cava is normal in size with greater than 50%  respiratory variability, suggesting right atrial pressure of 3 mmHg.  [Normal biatrial sizes]  ASSESSMENT   Kenneth Morales is a 57 y.o. male  has a past medical history of Acute MI (HCC) (MAY 2013), CAD (coronary artery disease), Cocaine abuse (HCC), Depression, Hyperlipidemia, Hypertension, Spider bite, and Tobacco abuse.  Certainly his known severe intracranial atherosclerotic disease as well as cocaine abuse, tobacco abuse, hypertension, hyperlipidemia and known coronary artery disease put him at significant risk for recurrent stroke.  Additionally Kenneth Morales is at high risk of developing vascular dementia  RECOMMENDATIONS  -MRI brain without contrast -UA, UDS -TOC/pharmacy consults to explore resources to improve medication adherence -Consider outpatient neurocognitive evaluation -Continue home dual antiplatelet therapy, would advise at least a 90-day course if not indefinite therapy given the extent of his atherosclerotic disease.  No need to repeat load given Kenneth Morales does endorse intermittent adherence -Given recent full stroke workup as documented above, Kenneth Morales is already medically optimized from a stroke risk perspective if adherence can be optimized.  No need for further inpatient workup from a neurologic perspective -Discussed with Dr. Erma Heritage via secure chat ______________________________________________________________________    Brooke Dare MD-PhD [facility] [phone] [facility] coverage for [facility] is from 8 AM to 4 AM in-house and 4 PM to 8 PM by telephone/video. 8 PM to 8 AM emergent questions or overnight urgent questions should be addressed to Teleneurology On-call or [facility] neurohospitalist; contact information can be found on AMION  CRITICAL CARE Performed by: Gordy Councilman   Total critical care time: 40 minutes  Critical care time was exclusive of  separately billable procedures and treating other patients.  Critical care  was necessary to treat or prevent imminent or life-threatening deterioration.  Critical care was time spent personally by me on the following activities: development of treatment plan with Kenneth Morales and/or surrogate as well as nursing, discussions with consultants, evaluation of Kenneth Morales's response to treatment, examination of Kenneth Morales, obtaining history from Kenneth Morales or surrogate, ordering and performing treatments and interventions, ordering and review of laboratory studies, ordering and review of radiographic studies, pulse oximetry and re-evaluation of Kenneth Morales's condition.

## 2023-04-16 NOTE — Progress Notes (Addendum)
8119 Stroke cart activated and elert sent to [facility]. Pt arrives via stretcher with EMS with c/o "feeling off balance" and intermittent slurred speech. EDP Erma Heritage, MD) at bedside assessing pt at this time. [id number] pt to CT and Dr. Iver Nestle with [facility] paged at this time. [id number] Dr. Iver Nestle at bedside in CT receiving SBAR and assessing pt. 0813 No further need from [facility], [facility] disconnected from stroke cart at this time.

## 2023-04-16 NOTE — ED Triage Notes (Signed)
Pt in via [facility]. Per EMS pt got up this morning and went to store when he came back he was having slurred speech, unable to communicate, and did stumble with family. Per EMS when they arrived pt presented with slurred speech. No dizziness, N/V. Pt has hx of stroke. Pt is not compliant with Meds.   Vitals Per EMS:  BS-104 High BP

## 2023-04-16 NOTE — ED Provider Notes (Signed)
[facility] Provider Note    Event Date/Time   First MD Initiated Contact with Patient 04/16/23 0800     (approximate)   History   No chief complaint on file.   HPI  Kenneth Morales is a 57 y.o. male with past medical history of prior MI, CAD, depression, hypertension, prior stroke, severe atherosclerotic disease, here with slurred speech.  The patient last known well was around 7 AM.  He began feeling acutely dizzy and had trouble ambulating.  He also had garbled speech.  Brother activated EMS.  Initially patient refused transport despite blood pressure being greater than 238/121.  However, eventually convinced to come to the ED.  Patient has not been taking of his medications.  Since EMS picked him up, he reportedly has had several recurrent issues of slurred speech.  On my assessment, patient states he feels closer back to his baseline.  Denies any difficulty speaking now.  Denies history of similar presentations.     Physical Exam   Triage Vital Signs: ED Triage Vitals  Encounter Vitals Group     BP      Systolic BP Percentile      Diastolic BP Percentile      Pulse      Resp      Temp      Temp src      SpO2      Weight      Height      Head Circumference      Peak Flow      Pain Score      Pain Loc      Pain Education      Exclude from Growth Chart     Most recent vital signs: There were no vitals filed for this visit.   General: Awake, no distress.  CV:  Good peripheral perfusion.  Regular rate and rhythm. Resp:  Normal work of breathing.  Abd:  No distention.  No tenderness. Other:  Cranial nerves II through XII intact.  Speech is grossly normal.  Face is symmetric.  Temperature is midline.  Strength in 5 bilateral lower extremities.  Normal sensation to light touch.   ED Results / Procedures / Treatments   Labs (all labs ordered are listed, but only abnormal results are displayed) Labs Reviewed  PROTIME-INR  APTT  CBC   DIFFERENTIAL  COMPREHENSIVE METABOLIC PANEL  ETHANOL  CBG MONITORING, ED  I-STAT CREATININE, ED     EKG ***   RADIOLOGY ***   ***I also independently reviewed and agree with radiologist interpretations.   PROCEDURES:  Critical Care performed: {CriticalCareYesNo:19197::"Yes, see critical care procedure note(s)","No"}  ***   MEDICATIONS ORDERED IN ED: Medications  sodium chloride flush (NS) 0.9 % injection 3 mL (has no administration in time range)     IMPRESSION / MDM / ASSESSMENT AND PLAN / ED COURSE  I reviewed the triage vital signs and the nursing notes.                              Differential diagnosis includes, but is not limited to, ***  Patient's presentation is most consistent with {EM COPA:27473}  {If the patient is on the monitor, remove the brackets and asterisks on the sentence below and remember to document it as a Procedure as well. Otherwise delete the sentence below:1} ***The patient is on the cardiac monitor to evaluate for evidence of arrhythmia and/or  significant heart rate changes      FINAL CLINICAL IMPRESSION(S) / ED DIAGNOSES   Final diagnoses:  None     Rx / DC Orders   ED Discharge Orders     None        Note:  This document was prepared using Dragon voice recognition software and may include unintentional dictation errors.

## 2023-04-16 NOTE — ED Notes (Signed)
Pt ambulatory to the bathroom. Pt gait steady.

## 2023-04-16 NOTE — Discharge Instructions (Addendum)
Medication Assistance  [facility] [address] [place], [place] [address]  IT IS VERY IMPORTANT THAT YOU TAKE YOUR ASPIRIN AND PLAVIX, AS PRESCRIBED.   THESE WILL HELP PREVENT ADDITIONAL STROKES AND ARE CRITICAL TO PREVENT THEM.  CALL THE NUMBER ABOVE FOR ASSISTANCE WITH GETTING AND ARRANGING YOUR MEDICATIONS

## 2023-05-20 ENCOUNTER — Other Ambulatory Visit: Payer: Self-pay | Admitting: Cardiovascular Disease

## 2023-06-09 ENCOUNTER — Emergency Department (HOSPITAL_COMMUNITY): Payer: Medicaid Other

## 2023-06-09 ENCOUNTER — Inpatient Hospital Stay (HOSPITAL_COMMUNITY)
Admission: EM | Admit: 2023-06-09 | Discharge: 2023-06-12 | DRG: 064 | Disposition: A | Discharge status: Home / Self Care | Payer: Medicaid Other | Attending: Internal Medicine | Admitting: Internal Medicine

## 2023-06-09 DIAGNOSIS — F32A Depression, unspecified: Secondary | ICD-10-CM | POA: Diagnosis present

## 2023-06-09 DIAGNOSIS — I6381 Other cerebral infarction due to occlusion or stenosis of small artery: Secondary | ICD-10-CM | POA: Diagnosis present

## 2023-06-09 DIAGNOSIS — F1410 Cocaine abuse, uncomplicated: Secondary | ICD-10-CM | POA: Diagnosis present

## 2023-06-09 DIAGNOSIS — G8194 Hemiplegia, unspecified affecting left nondominant side: Secondary | ICD-10-CM | POA: Diagnosis present

## 2023-06-09 DIAGNOSIS — I252 Old myocardial infarction: Secondary | ICD-10-CM | POA: Diagnosis not present

## 2023-06-09 DIAGNOSIS — I639 Cerebral infarction, unspecified: Secondary | ICD-10-CM | POA: Diagnosis not present

## 2023-06-09 DIAGNOSIS — Z1152 Encounter for screening for COVID-19: Secondary | ICD-10-CM

## 2023-06-09 DIAGNOSIS — Z91148 Patient's other noncompliance with medication regimen for other reason: Secondary | ICD-10-CM | POA: Diagnosis not present

## 2023-06-09 DIAGNOSIS — Z5900 Homelessness unspecified: Secondary | ICD-10-CM

## 2023-06-09 DIAGNOSIS — I10 Essential (primary) hypertension: Secondary | ICD-10-CM | POA: Diagnosis present

## 2023-06-09 DIAGNOSIS — I610 Nontraumatic intracerebral hemorrhage in hemisphere, subcortical: Secondary | ICD-10-CM | POA: Diagnosis present

## 2023-06-09 DIAGNOSIS — I63011 Cerebral infarction due to thrombosis of right vertebral artery: Secondary | ICD-10-CM | POA: Diagnosis present

## 2023-06-09 DIAGNOSIS — R29703 NIHSS score 3: Secondary | ICD-10-CM | POA: Diagnosis not present

## 2023-06-09 DIAGNOSIS — G510 Bell's palsy: Secondary | ICD-10-CM | POA: Diagnosis present

## 2023-06-09 DIAGNOSIS — R29702 NIHSS score 2: Secondary | ICD-10-CM | POA: Diagnosis not present

## 2023-06-09 DIAGNOSIS — I701 Atherosclerosis of renal artery: Secondary | ICD-10-CM | POA: Diagnosis not present

## 2023-06-09 DIAGNOSIS — Z955 Presence of coronary angioplasty implant and graft: Secondary | ICD-10-CM | POA: Diagnosis not present

## 2023-06-09 DIAGNOSIS — I2510 Atherosclerotic heart disease of native coronary artery without angina pectoris: Secondary | ICD-10-CM | POA: Diagnosis present

## 2023-06-09 DIAGNOSIS — Z888 Allergy status to other drugs, medicaments and biological substances status: Secondary | ICD-10-CM

## 2023-06-09 DIAGNOSIS — I6389 Other cerebral infarction: Secondary | ICD-10-CM | POA: Diagnosis not present

## 2023-06-09 DIAGNOSIS — Z79899 Other long term (current) drug therapy: Secondary | ICD-10-CM | POA: Diagnosis not present

## 2023-06-09 DIAGNOSIS — R1110 Vomiting, unspecified: Secondary | ICD-10-CM

## 2023-06-09 DIAGNOSIS — I823 Embolism and thrombosis of renal vein: Secondary | ICD-10-CM | POA: Diagnosis present

## 2023-06-09 DIAGNOSIS — Z716 Tobacco abuse counseling: Secondary | ICD-10-CM | POA: Diagnosis not present

## 2023-06-09 DIAGNOSIS — Z8249 Family history of ischemic heart disease and other diseases of the circulatory system: Secondary | ICD-10-CM | POA: Diagnosis not present

## 2023-06-09 DIAGNOSIS — F17210 Nicotine dependence, cigarettes, uncomplicated: Secondary | ICD-10-CM | POA: Diagnosis present

## 2023-06-09 DIAGNOSIS — E785 Hyperlipidemia, unspecified: Secondary | ICD-10-CM | POA: Diagnosis present

## 2023-06-09 DIAGNOSIS — Z7902 Long term (current) use of antithrombotics/antiplatelets: Secondary | ICD-10-CM

## 2023-06-09 DIAGNOSIS — R29701 NIHSS score 1: Secondary | ICD-10-CM | POA: Diagnosis present

## 2023-06-09 DIAGNOSIS — R471 Dysarthria and anarthria: Secondary | ICD-10-CM | POA: Diagnosis present

## 2023-06-09 DIAGNOSIS — Z880 Allergy status to penicillin: Secondary | ICD-10-CM

## 2023-06-09 DIAGNOSIS — Z7982 Long term (current) use of aspirin: Secondary | ICD-10-CM

## 2023-06-09 DIAGNOSIS — I6302 Cerebral infarction due to thrombosis of basilar artery: Principal | ICD-10-CM | POA: Diagnosis present

## 2023-06-09 LAB — URINALYSIS, ROUTINE W REFLEX MICROSCOPIC
Bilirubin Urine: NEGATIVE
Glucose, UA: NEGATIVE mg/dL
Hgb urine dipstick: NEGATIVE
Ketones, ur: NEGATIVE mg/dL
Nitrite: NEGATIVE
Protein, ur: 30 mg/dL — AB
Specific Gravity, Urine: 1.024 (ref 1.005–1.030)
pH: 5 (ref 5.0–8.0)

## 2023-06-09 LAB — I-STAT CHEM 8, ED
BUN: 13 mg/dL (ref 6–20)
Calcium, Ion: 1.13 mmol/L — ABNORMAL LOW (ref 1.15–1.40)
Chloride: 106 mmol/L (ref 98–111)
Creatinine, Ser: 1.2 mg/dL (ref 0.61–1.24)
Glucose, Bld: 86 mg/dL (ref 70–99)
HCT: 47 % (ref 39.0–52.0)
Hemoglobin: 16 g/dL (ref 13.0–17.0)
Potassium: 4.2 mmol/L (ref 3.5–5.1)
Sodium: 143 mmol/L (ref 135–145)
TCO2: 29 mmol/L (ref 22–32)

## 2023-06-09 LAB — CBC
HCT: 46.8 % (ref 39.0–52.0)
Hemoglobin: 15.7 g/dL (ref 13.0–17.0)
MCH: 31.2 pg (ref 26.0–34.0)
MCHC: 33.5 g/dL (ref 30.0–36.0)
MCV: 93 fL (ref 80.0–100.0)
Platelets: 252 10*3/uL (ref 150–400)
RBC: 5.03 MIL/uL (ref 4.22–5.81)
RDW: 13.5 % (ref 11.5–15.5)
WBC: 9.3 10*3/uL (ref 4.0–10.5)
nRBC: 0 % (ref 0.0–0.2)

## 2023-06-09 LAB — DIFFERENTIAL
Abs Immature Granulocytes: 0.02 10*3/uL (ref 0.00–0.07)
Basophils Absolute: 0 10*3/uL (ref 0.0–0.1)
Basophils Relative: 0 %
Eosinophils Absolute: 0 10*3/uL (ref 0.0–0.5)
Eosinophils Relative: 0 %
Immature Granulocytes: 0 %
Lymphocytes Relative: 17 %
Lymphs Abs: 1.6 10*3/uL (ref 0.7–4.0)
Monocytes Absolute: 0.6 10*3/uL (ref 0.1–1.0)
Monocytes Relative: 6 %
Neutro Abs: 7 10*3/uL (ref 1.7–7.7)
Neutrophils Relative %: 77 %

## 2023-06-09 LAB — COMPREHENSIVE METABOLIC PANEL
ALT: 6 U/L (ref 0–44)
AST: 20 U/L (ref 15–41)
Albumin: 3.7 g/dL (ref 3.5–5.0)
Alkaline Phosphatase: 86 U/L (ref 38–126)
Anion gap: 10 (ref 5–15)
BUN: 10 mg/dL (ref 6–20)
CO2: 26 mmol/L (ref 22–32)
Calcium: 9.2 mg/dL (ref 8.9–10.3)
Chloride: 105 mmol/L (ref 98–111)
Creatinine, Ser: 1.25 mg/dL — ABNORMAL HIGH (ref 0.61–1.24)
GFR, Estimated: >60 mL/min (ref >60)
Glucose, Bld: 90 mg/dL (ref 70–99)
Potassium: 4.1 mmol/L (ref 3.5–5.1)
Sodium: 141 mmol/L (ref 135–145)
Total Bilirubin: 1.7 mg/dL — ABNORMAL HIGH (ref 0.0–1.2)
Total Protein: 7.2 g/dL (ref 6.5–8.1)

## 2023-06-09 LAB — RESP PANEL BY RT-PCR (RSV, FLU A&B, COVID)  RVPGX2
Influenza A by PCR: NEGATIVE
Influenza B by PCR: NEGATIVE
Resp Syncytial Virus by PCR: NEGATIVE
SARS Coronavirus 2 by RT PCR: NEGATIVE

## 2023-06-09 LAB — CBG MONITORING, ED: Glucose-Capillary: 79 mg/dL (ref 70–99)

## 2023-06-09 LAB — PROTIME-INR
INR: 1 (ref 0.8–1.2)
Prothrombin Time: 13.4 s (ref 11.4–15.2)

## 2023-06-09 LAB — ETHANOL: Alcohol, Ethyl (B): <10 mg/dL (ref <10)

## 2023-06-09 LAB — APTT: aPTT: 31 s (ref 24–36)

## 2023-06-09 MED ORDER — ONDANSETRON 4 MG PO TBDP
4.0000 mg | ORAL_TABLET | Freq: Once | ORAL | Status: AC
  Administered 2023-06-09: 4 mg via ORAL
  Filled 2023-06-09: qty 1

## 2023-06-09 MED ORDER — LABETALOL HCL 5 MG/ML IV SOLN: 20.0000 mg | INTRAVENOUS | Status: DC | PRN

## 2023-06-09 MED ORDER — ASPIRIN 81 MG PO CHEW
81.0000 mg | CHEWABLE_TABLET | Freq: Every day | ORAL | Status: DC
  Administered 2023-06-09 – 2023-06-12 (×4): 81 mg via ORAL
  Filled 2023-06-09 (×5): qty 1

## 2023-06-09 MED ORDER — CLOPIDOGREL BISULFATE 75 MG PO TABS
75.0000 mg | ORAL_TABLET | Freq: Every day | ORAL | Status: DC
  Administered 2023-06-09 – 2023-06-12 (×4): 75 mg via ORAL
  Filled 2023-06-09 (×4): qty 1

## 2023-06-09 MED ORDER — ACETAMINOPHEN 325 MG PO TABS: 650.0000 mg | ORAL_TABLET | ORAL | Status: DC | PRN

## 2023-06-09 MED ORDER — STROKE: EARLY STAGES OF RECOVERY BOOK
Freq: Once | Status: AC
  Filled 2023-06-09: qty 1

## 2023-06-09 MED ORDER — ATORVASTATIN CALCIUM 80 MG PO TABS
80.0000 mg | ORAL_TABLET | Freq: Every day | ORAL | Status: DC
  Administered 2023-06-10 – 2023-06-12 (×3): 80 mg via ORAL
  Filled 2023-06-09 (×3): qty 1

## 2023-06-09 MED ORDER — IOHEXOL 350 MG/ML SOLN
75.0000 mL | Freq: Once | INTRAVENOUS | Status: AC | PRN
  Administered 2023-06-09: 75 mL via INTRAVENOUS

## 2023-06-09 MED ORDER — ACETAMINOPHEN 650 MG RE SUPP: 650.0000 mg | RECTAL | Status: DC | PRN

## 2023-06-09 MED ORDER — ACETAMINOPHEN 160 MG/5ML PO SOLN: 650.0000 mg | ORAL | Status: DC | PRN

## 2023-06-09 MED ORDER — SENNOSIDES-DOCUSATE SODIUM 8.6-50 MG PO TABS: 1.0000 | ORAL_TABLET | Freq: Every evening | ORAL | Status: DC | PRN

## 2023-06-09 MED ORDER — ATORVASTATIN CALCIUM 40 MG PO TABS: 40.0000 mg | ORAL_TABLET | Freq: Every day | ORAL | Status: DC

## 2023-06-09 MED ORDER — NICOTINE 14 MG/24HR TD PT24
14.0000 mg | MEDICATED_PATCH | Freq: Every day | TRANSDERMAL | Status: DC
  Administered 2023-06-09 – 2023-06-11 (×2): 14 mg via TRANSDERMAL
  Filled 2023-06-09 (×3): qty 1

## 2023-06-09 NOTE — ED Notes (Signed)
Message sent to MD regarding patient's medication orders.

## 2023-06-09 NOTE — Assessment & Plan Note (Signed)
This is not a  confiemd diagnosis. CT fidnig of ", but my sense is that there is substantial soft plaque or mural thrombus in the proximal right renal artery, potentially causing substantial stenosis.". will get Duplex.

## 2023-06-09 NOTE — ED Provider Notes (Signed)
[facility] AT [facility] Provider Note   CSN: [id number] Arrival date & time: 06/09/23  1445     History  Chief Complaint  Patient presents with   Numbness    COLESTON DIROSA is a 58 y.o. male.  Pt is a 58 y.o. male with a history of hypertension, CAD, hyperlipidemia, prior stroke with chronic left-sided deficits, tobacco and cocaine use who is presenting today with several vague complaints.  Patient reports that this has been a very stressful week he lost his home and has been sleeping outside until yesterday.  However today he felt that he may be having a stroke because he reported that when waking up this morning he felt like his speech was a little bit more slurred but he was also having significant difficulty walking and felt like he was going a fall.  He states he had to concentrate very hard just to move anywhere.  He did last use cocaine last night when he was sleeping outside but denies alcohol use.  He also reports he has not been consistently taking his medications in the last week because there has been so much going on.  He denies any new weakness numbness vision loss.  He denies any pain or urination.  He initially complained in triage of abdominal pain however has not complained of abdominal pain here with me.      The history is provided by the patient.       Home Medications Prior to Admission medications   Medication Sig Start Date End Date Taking? Authorizing Provider  acetaminophen (TYLENOL) 325 MG tablet Take 2 tablets (650 mg total) by mouth every 4 (four) hours as needed for mild pain (or temp > 37.5 C (99.5 F)). 07/14/18   Angiulli, Mcarthur Rossetti, PA-C  aspirin 81 MG tablet Take 1 tablet (81 mg total) by mouth daily. 02/04/23   Alford Highland, MD  atorvastatin (LIPITOR) 80 MG tablet Take 1 tablet (80 mg total) by mouth daily at 6 PM. 02/04/23   Alford Highland, MD  clopidogrel (PLAVIX) 75 MG tablet Take 1 tablet (75 mg total) by  mouth daily. 02/04/23   Alford Highland, MD  feeding supplement (ENSURE ENLIVE / ENSURE PLUS) LIQD Take 237 mLs by mouth 2 (two) times daily between meals. 02/04/23   Alford Highland, MD  losartan (COZAAR) 25 MG tablet Take 1 tablet (25 mg total) by mouth at bedtime. 02/04/23   Alford Highland, MD  metoprolol succinate (TOPROL-XL) 25 MG 24 hr tablet Take 1 tablet (25 mg total) by mouth daily. 02/04/23   Alford Highland, MD  nicotine (NICODERM CQ - DOSED IN MG/24 HOURS) 14 mg/24hr patch One 14 mg patch chest wall daily (okay to substitute generic) 02/05/23   Renae Gloss, Richard, MD  nitroGLYCERIN (NITROSTAT) 0.4 MG SL tablet Place 1 tablet (0.4mg  total) under the tongue every 5 minutes as needed for chest pain. May take 3 doses. 02/04/23   Alford Highland, MD      Allergies    Brilinta [ticagrelor] and Penicillins    Review of Systems   Review of Systems  Physical Exam Updated Vital Signs BP (!) 188/108 (BP Location: Right Arm)   Pulse 72   Temp 98.4 F (36.9 C) (Oral)   Resp 12   Ht 5\' 10"  (1.778 m)   Wt 72.6 kg   SpO2 100%   BMI 22.96 kg/m  Physical Exam Vitals and nursing note reviewed.  Constitutional:      General:  He is not in acute distress.    Appearance: He is well-developed.  HENT:     Head: Normocephalic and atraumatic.  Eyes:     Conjunctiva/sclera: Conjunctivae normal.     Pupils: Pupils are equal, round, and reactive to light.  Cardiovascular:     Rate and Rhythm: Normal rate and regular rhythm.     Heart sounds: No murmur heard. Pulmonary:     Effort: Pulmonary effort is normal. No respiratory distress.     Breath sounds: Normal breath sounds. No wheezing or rales.  Abdominal:     General: There is no distension.     Palpations: Abdomen is soft.     Tenderness: There is no abdominal tenderness. There is no guarding or rebound.  Musculoskeletal:        General: No tenderness. Normal range of motion.     Cervical back: Normal range of motion and neck  supple.  Skin:    General: Skin is warm and dry.     Findings: No erythema or rash.  Neurological:     Mental Status: He is alert and oriented to person, place, and time.     Comments: Mild left-sided facial droop, mild weakness in the left upper and lower extremity with some mild clumsiness.  5 out of 5 strength in the right upper and lower extremity without pronator drift.  Normal sensation bilaterally.  No visual field cuts.  Speech is slightly slurred with occasional aphasia.  Psychiatric:        Behavior: Behavior normal.     ED Results / Procedures / Treatments   Labs (all labs ordered are listed, but only abnormal results are displayed) Labs Reviewed  COMPREHENSIVE METABOLIC PANEL - Abnormal; Notable for the following components:      Result Value   Creatinine, Ser 1.25 (*)    Total Bilirubin 1.7 (*)    All other components within normal limits  URINALYSIS, ROUTINE W REFLEX MICROSCOPIC - Abnormal; Notable for the following components:   Color, Urine AMBER (*)    APPearance HAZY (*)    Protein, ur 30 (*)    Leukocytes,Ua TRACE (*)    Bacteria, UA RARE (*)    All other components within normal limits  I-STAT CHEM 8, ED - Abnormal; Notable for the following components:   Calcium, Ion 1.13 (*)    All other components within normal limits  RESP PANEL BY RT-PCR (RSV, FLU A&B, COVID)  RVPGX2  PROTIME-INR  APTT  CBC  DIFFERENTIAL  ETHANOL  CBG MONITORING, ED    EKG EKG Interpretation Date/Time:  Thursday June 09 2023 16:17:46 EST Ventricular Rate:  68 PR Interval:  150 QRS Duration:  98 QT Interval:  380 QTC Calculation: 404 R Axis:   -73  Text Interpretation: Normal sinus rhythm Possible Left atrial enlargement Incomplete right bundle branch block Left anterior fascicular block Minimal voltage criteria for LVH, may be normal variant ( Cornell product ) T wave abnormality, consider lateral ischemia No significant change since last tracing When compared with ECG of  16-Apr-2023 08:43, PREVIOUS ECG IS PRESENT Confirmed by Gwyneth Sprout ([id number]) on 06/09/2023 6:43:40 PM  Radiology CT ABDOMEN PELVIS W CONTRAST Result Date: 06/09/2023 CLINICAL DATA:  Acute abdominal pain with nausea and vomiting EXAM: CT ABDOMEN AND PELVIS WITH CONTRAST TECHNIQUE: Multidetector CT imaging of the abdomen and pelvis was performed using the standard protocol following bolus administration of intravenous contrast. RADIATION DOSE REDUCTION: This exam was performed according to the departmental dose-optimization program  which includes automated exposure control, adjustment of the mA and/or kV according to patient size and/or use of iterative reconstruction technique. CONTRAST:  75mL OMNIPAQUE IOHEXOL 350 MG/ML SOLN COMPARISON:  11/29/2017 FINDINGS: Despite efforts by the technologist and patient, motion artifact is present on today's exam and could not be eliminated. This reduces exam sensitivity and specificity. Lower chest: Emphysema. Hepatobiliary: Mm hypodense lesion in segment 3 of the liver on image 9 series 2, previously 4 mm on 11/29/2017, considered benign. No further imaging workup of this lesion is indicated. Similarly a 4 mm hypodense lesion in the right hepatic lobe on image 23 series 2 is present in 2019 and is considered benign. Gallbladder not observed, presumed surgically absent. No biliary dilatation. Pancreas: Unremarkable Spleen: Unremarkable Adrenals/Urinary Tract: Unremarkable Stomach/Bowel: Mild rugal fold prominence in the fundus and gastric body, probably incidental, an without discrete mass identified. Sigmoid colon diverticulosis. Anastomotic staple line at the anorectal junction. Scattered diverticula in the descending colon. No dilated small bowel. Vascular/Lymphatic: Atherosclerosis is present, including aortoiliac atherosclerotic disease. Today's exam was not performed as a CT angiogram and motion artifact limits assessment, but my sense is that there is substantial  soft plaque or mural thrombus in the proximal right renal artery. Reproductive: Mild prostatomegaly. Other: No supplemental non-categorized findings. Musculoskeletal: Mild to moderate degenerative hip arthropathy bilaterally. IMPRESSION: 1. A specific cause for the patient's acute abdominal pain is not identified. 2. Colonic diverticulosis. 3. Mild prostatomegaly. 4. Mild to moderate degenerative hip arthropathy bilaterally. 5. Emphysema. 6. Aortic atherosclerosis. Today's exam was not performed as a CT angiogram and motion artifact limits assessment, but my sense is that there is substantial soft plaque or mural thrombus in the proximal right renal artery, potentially causing substantial stenosis. Electronically Signed   By: Gaylyn Rong M.D.   On: 06/09/2023 18:28   CT HEAD WO CONTRAST Result Date: 06/09/2023 CLINICAL DATA:  Transient ischemic attack, numbness in left arm. EXAM: CT HEAD WITHOUT CONTRAST TECHNIQUE: Contiguous axial images were obtained from the base of the skull through the vertex without intravenous contrast. RADIATION DOSE REDUCTION: This exam was performed according to the departmental dose-optimization program which includes automated exposure control, adjustment of the mA and/or kV according to patient size and/or use of iterative reconstruction technique. COMPARISON:  MRI brain 06/09/2023 FINDINGS: Brain: Small focal hypodensity in the left paracentral pons on image 7 series 2, cannot exclude mild chronic ischemic microvascular white matter disease or a small chronic lacunar infarct. Additional small lacunar infarct involving the head of the right caudate nucleus and adjacent anterior limb right internal capsule on image 14 series 2. Remote right posterior watershed distribution infarct. Periventricular white matter and corona radiata hypodensities favor chronic ischemic microvascular white matter disease. This appears similar to prior. Otherwise, the brainstem, cerebellum, cerebral  peduncles, thalamus, basal ganglia, basilar cisterns, and ventricular system appear within normal limits. No intracranial hemorrhage, mass lesion, or acute CVA. Vascular: Unremarkable Skull: Unremarkable Sinuses/Orbits: Unremarkable Other: No supplemental non-categorized findings. IMPRESSION: 1. No acute intracranial findings. 2. Remote right posterior watershed distribution infarct. 3. Small lacunar infarcts involving the head of the right caudate nucleus and adjacent anterior limb right internal capsule. 4. Periventricular white matter and corona radiata hypodensities favor chronic ischemic microvascular white matter disease. 5. Small focal hypodensity in the left paracentral pons, cannot exclude mild chronic ischemic microvascular white matter disease or a small chronic lacunar infarct. Electronically Signed   By: Gaylyn Rong M.D.   On: 06/09/2023 18:20   MR BRAIN WO  CONTRAST Result Date: 06/09/2023 CLINICAL DATA:  Neuro deficit, acute, stroke suspected EXAM: MRI HEAD WITHOUT CONTRAST TECHNIQUE: Multiplanar, multiecho pulse sequences of the brain and surrounding structures were obtained without intravenous contrast. COMPARISON:  Brain MR 04/16/2023 FINDINGS: Brain: Punctate acute infarct of the brachium pontis on the right (series 2, image 13). There is also an acute infarcts in the right hemi pons (series 2, image 18). Compared to prior exam there is new small left thalamic microhemorrhage. There is a background of moderate to severe chronic microvascular ischemic change. There is chronic infarcts in the right parietal lobe and right caudate head. There is advanced generalized volume loss. Vascular: Normal flow voids. Skull and upper cervical spine: Normal marrow signal. Sinuses/Orbits: No middle ear or mastoid effusion. Paranasal sinuses are clear. Orbits are unremarkable. Other: None. IMPRESSION: 1. Punctate acute infarct of the brachium pontis on the right. 2. Linear acute infarct in the right hemi  pons. 3. New small left thalamic microhemorrhage. Electronically Signed   By: Lorenza Cambridge M.D.   On: 06/09/2023 17:26    Procedures Procedures    Medications Ordered in ED Medications  ondansetron (ZOFRAN-ODT) disintegrating tablet 4 mg (4 mg Oral Given 06/09/23 1611)  iohexol (OMNIPAQUE) 350 MG/ML injection 75 mL (75 mLs Intravenous Contrast Given 06/09/23 1733)    ED Course/ Medical Decision Making/ A&P                                 Medical Decision Making Amount and/or Complexity of Data Reviewed External Data Reviewed: notes. Labs: ordered. Decision-making details documented in ED Course. Radiology: ordered and independent interpretation performed. Decision-making details documented in ED Course. ECG/medicine tests: ordered and independent interpretation performed. Decision-making details documented in ED Course.  Risk Decision regarding hospitalization.   Pt with multiple medical problems and comorbidities and presenting today with a complaint that caries a high risk for morbidity and mortality.  Patient here today with symptoms concerning for possible new stroke however symptoms been present since he woke up today and not a candidate for code stroke or TNKase.  Also in triage patient complained of abdominal pain however at this time he has no complaints of abdominal pain.  Denying any infectious etiology.  Patient has been noncompliant with medication and is also still using cocaine and tobacco.  I independently interpreted patient's labs and EKG.  Labs stable CBC, CMP and coags.  UA without acute findings viral panel is negative.  EKG without acute findings.  I have independently visualized and interpreted pt's images today.  Abdominal CT without acute findings such as obstruction or appendicitis.  Radiology notes that patient has arthritis, diverticulosis and prostatomegaly but no other acute findings today.  They also reported aortic atherosclerosis and possible mural  thrombosis in the proximal right renal artery but it was not done as a CTA.  MRI of the brain showed punctate acute infarcts of the pontis on the right and a linear acute infarct in the right Hemi pons with a new small left lower laminectomy microhemorrhage which they felt was most likely from hypertension.  Findings discussed with the patient.  Will consult with neurology.          Final Clinical Impression(s) / ED Diagnoses Final diagnoses:  Cerebrovascular accident (CVA), unspecified mechanism (HCC)    Rx / DC Orders ED Discharge Orders     None         Gwyneth Sprout, MD  06/09/23 1844  

## 2023-06-09 NOTE — ED Provider Triage Note (Signed)
Emergency Medicine Provider Triage Evaluation Note  Kenneth Morales , a 58 y.o. male  was evaluated in triage.  Pt complains of abdominal pain, nausea vomiting.  He has history of strokes with left-sided weakness balance issues and speech issues at baseline.  He is also concern for new stroke but he does not really endorse any new stroke symptoms.  He thinks maybe his speech might be a little bit different when he woke up this morning and when he went to bed last night at 12.  But otherwise he denies any new weakness numbness vision loss.  He denies any pain or urination.  His biggest concern is left-sided abdominal pain but he is also concerned about stroke.  Review of Systems  Positive: Abdominal pain nausea vomiting, maybe increase slurred speech Negative: deniesNew weakness numbness tingling  Physical Exam  BP (!) 145/105 (BP Location: Left Arm)   Pulse 84   Temp 98.6 F (37 C) (Oral)   Resp 18   SpO2 97%  Gen:   Awake, no distress   Resp:  Normal effort  MSK:   Moves extremities without difficulty  Other:  Slightly slurred speech on exam slightly weak on the left arm and left leg compared to the right but patient states baseline neurologic exam otherwise unremarkable.  Medical Decision Making  Medically screening exam initiated at 3:40 PM.  Appropriate orders placed.  Kenneth Morales was informed that the remainder of the evaluation will be completed by another provider, this initial triage assessment does not replace that evaluation, and the importance of remaining in the ED until their evaluation is complete.  Will pursue stroke workup and abdominal workup.  Clinically is outside the window for TNK.  He is not LVO positive.  Somewhat poor historian.  I do not really get a new sense of any new stroke symptoms but will get this evaluated.  He is tender in the left lower abdomen will get CT scan.   Virgina Norfolk, DO 06/09/23 [phone]

## 2023-06-09 NOTE — Assessment & Plan Note (Addendum)
Patient has both ischemic as well as hemorrhagic findings on the MRI.  Punctate acute infarct on the brachium pontis on the right as well as linear acute infarct on the right Hemi pons.  L as well as new small left thalamic microhemorrhage.  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  I will repeat a CAT scan at this time to document that there is no enlargement of the microhemorrhage.  I will avoid anticoagulation antiplatelet at this time.  I will treat the patient with statin swallow screen serial neurologic exam.  I will maintain patient's blood pressure under 160 mmHg systolic.  Patient will be transferred to the progressive care unit.  I will engage neurology help as well.  Addendum 8:21 PM- disciussed with Dr. Amada Jupiter. He reviewed imaging. His opinion is this is a chronic ICH. Therefore advised no deviation from usual ischemic stroke protocol. See orders. Repeat CT head canceled.

## 2023-06-09 NOTE — Assessment & Plan Note (Signed)
Patient reported 1 episodes to triage provider. Denies to me. CT abd as above. Will advance diet.

## 2023-06-09 NOTE — ED Triage Notes (Signed)
Patient BIB [facility] from [facility] for numbness in left arm that was noticed upon waking at 7am this morning after going to sleep at midnight. Patient reports feeling bad for a few days, hasn't been taking meds, and has hx of previous stroke.A&Ox4 and ambulatory with EMS.

## 2023-06-09 NOTE — ED Notes (Signed)
Pt coming from imaging

## 2023-06-09 NOTE — H&P (Addendum)
History and Physical    Patient: Kenneth Morales:[id number] DOB: May 28, 1965 DOA: 06/09/2023 DOS: the patient was seen and examined on 06/09/2023 PCP: [facility]  Patient coming from: Homeless  Chief Complaint:  Chief Complaint  Patient presents with   Numbness   HPI: Kenneth Morales is a 58 y.o. male with medical history significant of prior remote stroke, current cocaine use.  Patient describes having chronic left upper and lower extremity weakness.  However patient is generally ambulatory.  Unfortunately patient became homeless about a week ago.  Patient has not been taking his chronic prescribed meds regularly.  Further patient has also been using cocaine.  Last well known about midnight last night. Patient reports waking up this morning and feeling "different ".  History is very difficult to obtain from the patient however patient describes basically having to think before making any step because he was so concerned about falling.  If he was not cautious about it, he was stepping awkwardly.  And he was concerned that he may fall.  Patient denies presyncope or vertigo vision changes focal weakness that is new, bladder or bowel incontinence or any trauma or neck pain. +ve slurred speech.  Patient noted to be hypertensive in the ER blood pressure 188/108.  MRI brain shows punctate acute infarct of the brachium pontis on the right.  Linear acute infarct in the right Hemi pons.  New small left thalamic microhemorrhage.  Patient seems to have reported some abdominal discomfort and vomiting episode to triage provider.  Patient did get CT abdomen pelvis.  Nonspecific findings as below.  Curently feeling hungry. Denies nasuea/active vomiting. No abd pain. No headache, vision normal. See exam below. Review of Systems: As mentioned in the history of present illness. All other systems reviewed and are negative. Past Medical History:  Diagnosis Date   Acute MI [facility]) MAY 2013   CAD  (coronary artery disease)    NSTEMI in 05/13 in setting of Cocaine use. Cath: 99% mid LCX stenosis with a large thrombus. PCI and BMS (4.0 X15 mm Vision) placement to mid LCX, LAD: 20%, RCA: 30%, EF: 60%.    Cocaine abuse (HCC)    quit in 08/2011   Depression    Hyperlipidemia    Hypertension    Spider bite    Tobacco abuse    Past Surgical History:  Procedure Laterality Date   CARDIAC CATHETERIZATION  MAY 2013   s/p stent @ [facility]   CARDIAC CATHETERIZATION     CORONARY CTO INTERVENTION N/A 01/27/2018   Procedure: CORONARY CTO INTERVENTION;  Surgeon: Corky Crafts, MD;  Location: [facility] INVASIVE CV LAB;  Service: Cardiovascular;  Laterality: N/A;   CORONARY STENT INTERVENTION N/A 11/29/2017   Procedure: CORONARY STENT INTERVENTION;  Surgeon: Alwyn Pea, MD;  Location: [facility] INVASIVE CV LAB;  Service: Cardiovascular;  Laterality: N/A;   CORONARY STENT INTERVENTION N/A 01/27/2018   Procedure: CORONARY STENT INTERVENTION;  Surgeon: Corky Crafts, MD;  Location: [facility] INVASIVE CV LAB;  Service: Cardiovascular;  Laterality: N/A;  mid cfx   HEMORRHOID SURGERY     LEFT HEART CATH AND CORONARY ANGIOGRAPHY Right 11/29/2017   Procedure: Left heart catheterization with possible PCI;  Surgeon: Laurier Nancy, MD;  Location: [facility] INVASIVE CV LAB;  Service: Cardiovascular;  Laterality: Right;   LEFT HEART CATH AND CORONARY ANGIOGRAPHY N/A 01/27/2018   Procedure: LEFT HEART CATH AND CORONARY ANGIOGRAPHY;  Surgeon: Corky Crafts, MD;  Location: [facility] INVASIVE CV LAB;  Service:  Cardiovascular;  Laterality: N/A;   LEFT HEART CATH AND CORONARY ANGIOGRAPHY N/A 11/15/2018   Procedure: LEFT HEART CATH AND CORONARY ANGIOGRAPHY and possible pci and stent;  Surgeon: Alwyn Pea, MD;  Location: [facility] INVASIVE CV LAB;  Service: Cardiovascular;  Laterality: N/A;   Social History:  reports that he has been smoking cigarettes. He has a 15 pack-year smoking history. He has never used smokeless tobacco.  He reports current alcohol use of about 6.0 standard drinks of alcohol per week. He reports that he does not use drugs.  Allergies  Allergen Reactions   Brilinta [Ticagrelor] Shortness Of Breath   Penicillins Rash    Has patient had a PCN reaction causing immediate rash, facial/tongue/throat swelling, SOB or lightheadedness with hypotension: Yes Has patient had a PCN reaction causing severe rash involving mucus membranes or skin necrosis: No Has patient had a PCN reaction that required hospitalization: No Has patient had a PCN reaction occurring within the last 10 years: No If all of the above answers are "NO", then may proceed with Cephalosporin use.    Family History  Problem Relation Age of Onset   Heart attack Maternal Grandfather    Heart attack Paternal Grandfather     Prior to Admission medications   Medication Sig Start Date End Date Taking? Authorizing Provider  acetaminophen (TYLENOL) 325 MG tablet Take 2 tablets (650 mg total) by mouth every 4 (four) hours as needed for mild pain (or temp > 37.5 C (99.5 F)). 07/14/18   Angiulli, Mcarthur Rossetti, PA-C  aspirin 81 MG tablet Take 1 tablet (81 mg total) by mouth daily. 02/04/23   Alford Highland, MD  atorvastatin (LIPITOR) 80 MG tablet Take 1 tablet (80 mg total) by mouth daily at 6 PM. 02/04/23   Alford Highland, MD  clopidogrel (PLAVIX) 75 MG tablet Take 1 tablet (75 mg total) by mouth daily. 02/04/23   Alford Highland, MD  feeding supplement (ENSURE ENLIVE / ENSURE PLUS) LIQD Take 237 mLs by mouth 2 (two) times daily between meals. 02/04/23   Alford Highland, MD  losartan (COZAAR) 25 MG tablet Take 1 tablet (25 mg total) by mouth at bedtime. 02/04/23   Alford Highland, MD  metoprolol succinate (TOPROL-XL) 25 MG 24 hr tablet Take 1 tablet (25 mg total) by mouth daily. 02/04/23   Alford Highland, MD  nicotine (NICODERM CQ - DOSED IN MG/24 HOURS) 14 mg/24hr patch One 14 mg patch chest wall daily (okay to substitute generic) 02/05/23    Alford Highland, MD  nitroGLYCERIN (NITROSTAT) 0.4 MG SL tablet Place 1 tablet (0.4mg  total) under the tongue every 5 minutes as needed for chest pain. May take 3 doses. 02/04/23   Alford Highland, MD    Physical Exam: Vitals:   06/09/23 1448 06/09/23 1453 06/09/23 1754  BP:  (!) 145/105 (!) 188/108  Pulse:  84 72  Resp:  18 12  Temp:  98.6 F (37 C) 98.4 F (36.9 C)  TempSrc:  Oral Oral  SpO2: 100% 97% 100%  Weight:   72.6 kg  Height:   5\' 10"  (1.778 m)   General: Patient is alert and awake, brother at bedside.  Patient does not appear to be distressed.  Patient is actually oriented to location time date.  Able to give me name and date of birth.  Has some slurring of speech. Respiratory exam: Bilateral intravesicular Cardiovascular exam S1-S2 normal Abdomen all quadrants are soft nontender Extremities warm without edema. On direct strength testing I actually did not appreciate  any focal weakness in any extremity.  Facies are symmetric, gazes are preserved. Data Reviewed:  Labs on Admission:  Results for orders placed or performed during the hospital encounter of 06/09/23 (from the past 24 hours)  Protime-INR     Status: None   Collection Time: 06/09/23  3:41 PM  Result Value Ref Range   Prothrombin Time 13.4 11.4 - 15.2 seconds   INR 1.0 0.8 - 1.2  APTT     Status: None   Collection Time: 06/09/23  3:41 PM  Result Value Ref Range   aPTT 31 24 - 36 seconds  CBC     Status: None   Collection Time: 06/09/23  3:41 PM  Result Value Ref Range   WBC 9.3 4.0 - 10.5 K/uL   RBC 5.03 4.22 - 5.81 MIL/uL   Hemoglobin 15.7 13.0 - 17.0 g/dL   HCT 95.6 21.3 - 08.6 %   MCV 93.0 80.0 - 100.0 fL   MCH 31.2 26.0 - 34.0 pg   MCHC 33.5 30.0 - 36.0 g/dL   RDW 57.8 46.9 - 62.9 %   Platelets 252 150 - 400 K/uL   nRBC 0.0 0.0 - 0.2 %  Differential     Status: None   Collection Time: 06/09/23  3:41 PM  Result Value Ref Range   Neutrophils Relative % 77 %   Neutro Abs 7.0 1.7 - 7.7 K/uL    Lymphocytes Relative 17 %   Lymphs Abs 1.6 0.7 - 4.0 K/uL   Monocytes Relative 6 %   Monocytes Absolute 0.6 0.1 - 1.0 K/uL   Eosinophils Relative 0 %   Eosinophils Absolute 0.0 0.0 - 0.5 K/uL   Basophils Relative 0 %   Basophils Absolute 0.0 0.0 - 0.1 K/uL   Immature Granulocytes 0 %   Abs Immature Granulocytes 0.02 0.00 - 0.07 K/uL  Comprehensive metabolic panel     Status: Abnormal   Collection Time: 06/09/23  3:41 PM  Result Value Ref Range   Sodium 141 135 - 145 mmol/L   Potassium 4.1 3.5 - 5.1 mmol/L   Chloride 105 98 - 111 mmol/L   CO2 26 22 - 32 mmol/L   Glucose, Bld 90 70 - 99 mg/dL   BUN 10 6 - 20 mg/dL   Creatinine, Ser 5.28 (H) 0.61 - 1.24 mg/dL   Calcium 9.2 8.9 - 41.3 mg/dL   Total Protein 7.2 6.5 - 8.1 g/dL   Albumin 3.7 3.5 - 5.0 g/dL   AST 20 15 - 41 U/L   ALT 6 0 - 44 U/L   Alkaline Phosphatase 86 38 - 126 U/L   Total Bilirubin 1.7 (H) 0.0 - 1.2 mg/dL   GFR, Estimated >24 >40 mL/min   Anion gap 10 5 - 15  Ethanol     Status: None   Collection Time: 06/09/23  3:41 PM  Result Value Ref Range   Alcohol, Ethyl (B) <10 <10 mg/dL  Resp panel by RT-PCR (RSV, Flu A&B, Covid) Anterior Nasal Swab     Status: None   Collection Time: 06/09/23  3:41 PM   Specimen: Anterior Nasal Swab  Result Value Ref Range   SARS Coronavirus 2 by RT PCR NEGATIVE NEGATIVE   Influenza A by PCR NEGATIVE NEGATIVE   Influenza B by PCR NEGATIVE NEGATIVE   Resp Syncytial Virus by PCR NEGATIVE NEGATIVE  Urinalysis, Routine w reflex microscopic -Urine, Clean Catch     Status: Abnormal   Collection Time: 06/09/23  3:55 PM  Result Value Ref Range   Color, Urine AMBER (A) YELLOW   APPearance HAZY (A) CLEAR   Specific Gravity, Urine 1.024 1.005 - 1.030   pH 5.0 5.0 - 8.0   Glucose, UA NEGATIVE NEGATIVE mg/dL   Hgb urine dipstick NEGATIVE NEGATIVE   Bilirubin Urine NEGATIVE NEGATIVE   Ketones, ur NEGATIVE NEGATIVE mg/dL   Protein, ur 30 (A) NEGATIVE mg/dL   Nitrite NEGATIVE NEGATIVE    Leukocytes,Ua TRACE (A) NEGATIVE   RBC / HPF 0-5 0 - 5 RBC/hpf   WBC, UA 6-10 0 - 5 WBC/hpf   Bacteria, UA RARE (A) NONE SEEN   Squamous Epithelial / HPF 0-5 0 - 5 /HPF   Mucus PRESENT    Hyaline Casts, UA PRESENT   I-stat chem 8, ED     Status: Abnormal   Collection Time: 06/09/23  4:03 PM  Result Value Ref Range   Sodium 143 135 - 145 mmol/L   Potassium 4.2 3.5 - 5.1 mmol/L   Chloride 106 98 - 111 mmol/L   BUN 13 6 - 20 mg/dL   Creatinine, Ser 1.61 0.61 - 1.24 mg/dL   Glucose, Bld 86 70 - 99 mg/dL   Calcium, Ion 0.96 (L) 1.15 - 1.40 mmol/L   TCO2 29 22 - 32 mmol/L   Hemoglobin 16.0 13.0 - 17.0 g/dL   HCT 04.5 40.9 - 81.1 %  CBG monitoring, ED     Status: None   Collection Time: 06/09/23  6:19 PM  Result Value Ref Range   Glucose-Capillary 79 70 - 99 mg/dL   Basic Metabolic Panel: Recent Labs  Lab 06/09/23 1541 06/09/23 1603  NA 141 143  K 4.1 4.2  CL 105 106  CO2 26  --   GLUCOSE 90 86  BUN 10 13  CREATININE 1.25* 1.20  CALCIUM 9.2  --    Liver Function Tests: Recent Labs  Lab 06/09/23 1541  AST 20  ALT 6  ALKPHOS 86  BILITOT 1.7*  PROT 7.2  ALBUMIN 3.7   No results for input(s): "LIPASE", "AMYLASE" in the last 168 hours. No results for input(s): "AMMONIA" in the last 168 hours. CBC: Recent Labs  Lab 06/09/23 1541 06/09/23 1603  WBC 9.3  --   NEUTROABS 7.0  --   HGB 15.7 16.0  HCT 46.8 47.0  MCV 93.0  --   PLT 252  --    Cardiac Enzymes: No results for input(s): "CKTOTAL", "CKMB", "CKMBINDEX", "TROPONINIHS" in the last 168 hours.  BNP (last 3 results) No results for input(s): "PROBNP" in the last 8760 hours. CBG: Recent Labs  Lab 06/09/23 1819  GLUCAP 79    Radiological Exams on Admission:  CT ABDOMEN PELVIS W CONTRAST Result Date: 06/09/2023 CLINICAL DATA:  Acute abdominal pain with nausea and vomiting EXAM: CT ABDOMEN AND PELVIS WITH CONTRAST TECHNIQUE: Multidetector CT imaging of the abdomen and pelvis was performed using the  standard protocol following bolus administration of intravenous contrast. RADIATION DOSE REDUCTION: This exam was performed according to the departmental dose-optimization program which includes automated exposure control, adjustment of the mA and/or kV according to patient size and/or use of iterative reconstruction technique. CONTRAST:  75mL OMNIPAQUE IOHEXOL 350 MG/ML SOLN COMPARISON:  11/29/2017 FINDINGS: Despite efforts by the technologist and patient, motion artifact is present on today's exam and could not be eliminated. This reduces exam sensitivity and specificity. Lower chest: Emphysema. Hepatobiliary: Mm hypodense lesion in segment 3 of the liver on image 9 series 2, previously 4 mm  on 11/29/2017, considered benign. No further imaging workup of this lesion is indicated. Similarly a 4 mm hypodense lesion in the right hepatic lobe on image 23 series 2 is present in 2019 and is considered benign. Gallbladder not observed, presumed surgically absent. No biliary dilatation. Pancreas: Unremarkable Spleen: Unremarkable Adrenals/Urinary Tract: Unremarkable Stomach/Bowel: Mild rugal fold prominence in the fundus and gastric body, probably incidental, an without discrete mass identified. Sigmoid colon diverticulosis. Anastomotic staple line at the anorectal junction. Scattered diverticula in the descending colon. No dilated small bowel. Vascular/Lymphatic: Atherosclerosis is present, including aortoiliac atherosclerotic disease. Today's exam was not performed as a CT angiogram and motion artifact limits assessment, but my sense is that there is substantial soft plaque or mural thrombus in the proximal right renal artery. Reproductive: Mild prostatomegaly. Other: No supplemental non-categorized findings. Musculoskeletal: Mild to moderate degenerative hip arthropathy bilaterally. IMPRESSION: 1. A specific cause for the patient's acute abdominal pain is not identified. 2. Colonic diverticulosis. 3. Mild  prostatomegaly. 4. Mild to moderate degenerative hip arthropathy bilaterally. 5. Emphysema. 6. Aortic atherosclerosis. Today's exam was not performed as a CT angiogram and motion artifact limits assessment, but my sense is that there is substantial soft plaque or mural thrombus in the proximal right renal artery, potentially causing substantial stenosis. Electronically Signed   By: Gaylyn Rong M.D.   On: 06/09/2023 18:28   CT HEAD WO CONTRAST Result Date: 06/09/2023 CLINICAL DATA:  Transient ischemic attack, numbness in left arm. EXAM: CT HEAD WITHOUT CONTRAST TECHNIQUE: Contiguous axial images were obtained from the base of the skull through the vertex without intravenous contrast. RADIATION DOSE REDUCTION: This exam was performed according to the departmental dose-optimization program which includes automated exposure control, adjustment of the mA and/or kV according to patient size and/or use of iterative reconstruction technique. COMPARISON:  MRI brain 06/09/2023 FINDINGS: Brain: Small focal hypodensity in the left paracentral pons on image 7 series 2, cannot exclude mild chronic ischemic microvascular white matter disease or a small chronic lacunar infarct. Additional small lacunar infarct involving the head of the right caudate nucleus and adjacent anterior limb right internal capsule on image 14 series 2. Remote right posterior watershed distribution infarct. Periventricular white matter and corona radiata hypodensities favor chronic ischemic microvascular white matter disease. This appears similar to prior. Otherwise, the brainstem, cerebellum, cerebral peduncles, thalamus, basal ganglia, basilar cisterns, and ventricular system appear within normal limits. No intracranial hemorrhage, mass lesion, or acute CVA. Vascular: Unremarkable Skull: Unremarkable Sinuses/Orbits: Unremarkable Other: No supplemental non-categorized findings. IMPRESSION: 1. No acute intracranial findings. 2. Remote right  posterior watershed distribution infarct. 3. Small lacunar infarcts involving the head of the right caudate nucleus and adjacent anterior limb right internal capsule. 4. Periventricular white matter and corona radiata hypodensities favor chronic ischemic microvascular white matter disease. 5. Small focal hypodensity in the left paracentral pons, cannot exclude mild chronic ischemic microvascular white matter disease or a small chronic lacunar infarct. Electronically Signed   By: Gaylyn Rong M.D.   On: 06/09/2023 18:20   MR BRAIN WO CONTRAST Result Date: 06/09/2023 CLINICAL DATA:  Neuro deficit, acute, stroke suspected EXAM: MRI HEAD WITHOUT CONTRAST TECHNIQUE: Multiplanar, multiecho pulse sequences of the brain and surrounding structures were obtained without intravenous contrast. COMPARISON:  Brain MR 04/16/2023 FINDINGS: Brain: Punctate acute infarct of the brachium pontis on the right (series 2, image 13). There is also an acute infarcts in the right hemi pons (series 2, image 18). Compared to prior exam there is new small left thalamic  microhemorrhage. There is a background of moderate to severe chronic microvascular ischemic change. There is chronic infarcts in the right parietal lobe and right caudate head. There is advanced generalized volume loss. Vascular: Normal flow voids. Skull and upper cervical spine: Normal marrow signal. Sinuses/Orbits: No middle ear or mastoid effusion. Paranasal sinuses are clear. Orbits are unremarkable. Other: None. IMPRESSION: 1. Punctate acute infarct of the brachium pontis on the right. 2. Linear acute infarct in the right hemi pons. 3. New small left thalamic microhemorrhage. Electronically Signed   By: Lorenza Cambridge M.D.   On: 06/09/2023 17:26     EKG: Independently reviewed. LVH NSR  No intake/output data recorded. No intake/output data recorded.        Assessment and Plan: * Stroke (cerebrum) [facility]) Patient has both ischemic as well as hemorrhagic  findings on the MRI.  Punctate acute infarct on the brachium pontis on the right as well as linear acute infarct on the right Hemi pons.  L as well as new small left thalamic microhemorrhage.  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  I will repeat a CAT scan at this time to document that there is no enlargement of the microhemorrhage.  I will avoid anticoagulation antiplatelet at this time.  I will treat the patient with statin swallow screen serial neurologic exam.  I will maintain patient's blood pressure under 160 mmHg systolic.  Patient will be transferred to the progressive care unit.  I will engage neurology help as well.  Addendum 8:21 PM- disciussed with Dr. Amada Jupiter. He reviewed imaging. His opinion is this is a chronic ICH. Therefore advised no deviation from usual ischemic stroke protocol. See orders. Repeat CT head canceled.  Vomiting Patient reported 1 episodes to triage provider. Denies to me. CT abd as above. Will advance diet.   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 CT fidnig of ", but my sense is that there is substantial soft plaque or mural thrombus in the proximal right renal artery, potentially causing substantial stenosis.". will get Duplex.   Med rec done as folows:  Resume aspirin after clearance from neuro C.w. atorvastatin. Hold losartan and metoprolol to allow accurate bp control using iv meds tonight. C.w. nicotine replacement therapy. Hold plavix due to ICH as above.  CXR pending.  Advance Care Planning:   Code Status: Prior full code.  Consults: neurology engagd by ER provider.  Family Communication: brother at bedside. All questions answered.  Severity of Illness: The appropriate patient status for  this patient is INPATIENT. Inpatient status is judged to be reasonable and necessary in order to provide the required intensity of service to ensure the patient's safety. The patient's presenting symptoms, physical exam findings, and initial radiographic and laboratory data in the context of their chronic comorbidities is felt to place them at high risk for further clinical deterioration. Furthermore, it is not anticipated that the patient will be medically stable for discharge from the hospital within 2 midnights of admission.   * I certify that at the point of admission it is my clinical judgment that the patient will require inpatient hospital care spanning beyond 2 midnights from the point of admission due to high intensity of service, high risk for further deterioration and high frequency of surveillance required.*  Author: Nolberto Hanlon, MD 06/09/2023 7:34  PM  For on call review [email or web address].

## 2023-06-09 NOTE — ED Notes (Signed)
Pt coming from [facility]

## 2023-06-09 NOTE — ED Notes (Signed)
Awaiting patient from lobby.

## 2023-06-10 ENCOUNTER — Inpatient Hospital Stay (HOSPITAL_COMMUNITY): Payer: Medicaid Other

## 2023-06-10 DIAGNOSIS — I6389 Other cerebral infarction: Secondary | ICD-10-CM | POA: Diagnosis not present

## 2023-06-10 DIAGNOSIS — I6381 Other cerebral infarction due to occlusion or stenosis of small artery: Secondary | ICD-10-CM | POA: Diagnosis not present

## 2023-06-10 DIAGNOSIS — I823 Embolism and thrombosis of renal vein: Secondary | ICD-10-CM

## 2023-06-10 DIAGNOSIS — I701 Atherosclerosis of renal artery: Secondary | ICD-10-CM | POA: Diagnosis not present

## 2023-06-10 DIAGNOSIS — I639 Cerebral infarction, unspecified: Secondary | ICD-10-CM | POA: Diagnosis not present

## 2023-06-10 LAB — HEMOGLOBIN A1C
Hgb A1c MFr Bld: 5.2 % (ref 4.8–5.6)
Mean Plasma Glucose: 102.54 mg/dL

## 2023-06-10 LAB — LIPID PANEL
Cholesterol: 157 mg/dL (ref 0–200)
HDL: 27 mg/dL — ABNORMAL LOW (ref >40)
LDL Cholesterol: 107 mg/dL — ABNORMAL HIGH (ref 0–99)
Total CHOL/HDL Ratio: 5.8 {ratio}
Triglycerides: 114 mg/dL (ref <150)
VLDL: 23 mg/dL (ref 0–40)

## 2023-06-10 LAB — RAPID URINE DRUG SCREEN, HOSP PERFORMED
Amphetamines: NOT DETECTED
Barbiturates: NOT DETECTED
Benzodiazepines: NOT DETECTED
Cocaine: POSITIVE — AB
Opiates: NOT DETECTED
Tetrahydrocannabinol: NOT DETECTED

## 2023-06-10 LAB — HIV ANTIBODY (ROUTINE TESTING W REFLEX): HIV Screen 4th Generation wRfx: NONREACTIVE

## 2023-06-10 MED ORDER — ENOXAPARIN SODIUM 40 MG/0.4ML IJ SOSY
40.0000 mg | PREFILLED_SYRINGE | Freq: Every evening | INTRAMUSCULAR | Status: DC
  Administered 2023-06-10 – 2023-06-11 (×2): 40 mg via SUBCUTANEOUS
  Filled 2023-06-10 (×2): qty 0.4

## 2023-06-10 MED ORDER — PERFLUTREN LIPID MICROSPHERE
1.0000 mL | INTRAVENOUS | Status: AC | PRN
  Administered 2023-06-10: 2 mL via INTRAVENOUS

## 2023-06-10 MED ORDER — EZETIMIBE 10 MG PO TABS
10.0000 mg | ORAL_TABLET | Freq: Every day | ORAL | Status: DC
  Administered 2023-06-10 – 2023-06-12 (×3): 10 mg via ORAL
  Filled 2023-06-10 (×3): qty 1

## 2023-06-10 NOTE — Evaluation (Signed)
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 Cath: 99% mid LCX stenosis with a large thrombus. PCI and BMS (4.0 X15 mm Vision) placement to mid LCX, LAD: 20%, RCA: 30%, EF: 60%.    Cocaine abuse (HCC)    quit in 08/2011   Depression    Hyperlipidemia    Hypertension    Spider bite    Tobacco abuse    Past Surgical History:  Past Surgical History:  Procedure Laterality Date   CARDIAC CATHETERIZATION  MAY 2013   s/p stent @ [facility]   CARDIAC CATHETERIZATION     CORONARY CTO INTERVENTION N/A 01/27/2018   Procedure: CORONARY CTO INTERVENTION;  Surgeon: Corky Crafts, MD;  Location: [facility] INVASIVE CV LAB;  Service: Cardiovascular;  Laterality: N/A;   CORONARY STENT INTERVENTION N/A 11/29/2017   Procedure: CORONARY STENT INTERVENTION;  Surgeon: Alwyn Pea, MD;  Location: [facility] INVASIVE CV LAB;  Service: Cardiovascular;  Laterality: N/A;   CORONARY STENT INTERVENTION N/A 01/27/2018   Procedure: CORONARY STENT  INTERVENTION;  Surgeon: Corky Crafts, MD;  Location: [facility] INVASIVE CV LAB;  Service: Cardiovascular;  Laterality: N/A;  mid cfx   HEMORRHOID SURGERY     LEFT HEART CATH AND CORONARY ANGIOGRAPHY Right 11/29/2017   Procedure: Left heart catheterization with possible PCI;  Surgeon: Laurier Nancy, MD;  Location: [facility] INVASIVE CV LAB;  Service: Cardiovascular;  Laterality: Right;   LEFT HEART CATH AND CORONARY ANGIOGRAPHY N/A 01/27/2018   Procedure: LEFT HEART CATH AND CORONARY ANGIOGRAPHY;  Surgeon: Corky Crafts, MD;  Location: [facility] INVASIVE CV LAB;  Service: Cardiovascular;  Laterality: N/A;   LEFT HEART CATH AND CORONARY ANGIOGRAPHY N/A 11/15/2018   Procedure: LEFT HEART CATH AND CORONARY ANGIOGRAPHY and possible pci and stent;  Surgeon: Alwyn Pea, MD;  Location: [facility] INVASIVE CV LAB;  Service: Cardiovascular;  Laterality: N/A;   HPI:  [name] is a 58 y.o. male with medical history significant of prior remote stroke, current cocaine use admitted on 06/09/23. Patient describes having chronic left upper and lower extremity weakness.  However patient is generally ambulatory.  Unfortunately patient became homeless about a week ago.  Patient reports waking up this morning and feeling "different ".  History is very difficult to obtain from the patient however patient describes basically having to think before making any step because he was so concerned about falling.  MRI brain shows punctate acute infarct of the brachium pontis on the right.  Linear  acute infarct in the right Hemi pons.  New small left thalamic microhemorrhage. ST consulted for speech/language cognitive assessment.   Assessment / Plan / Recommendation Clinical Impression  Pt seen for speech/language cognitive assessment via [employer] [employer] Mental Status Examination (SLUMS) with a score obtained of 23/30 with a typical score on this assessment being 27/30 or above.  Pt exhibited deficits in sustained attention  and short-term memory/recall of new information and organization.  Naming convergent/divergent tasks proved difficult as pt only able to name 9 items in a simple category with a time constraint.  Pt's speech was min dysarthric with 75-100% intelligibility noted within complex conversation and L facial weakness/asymmetry noted on OME.  Pt denies dysphagia and passed Yale swallow screen. Pt informed SLP he had a prior CVA, so memory/attention may be decreased at baseline.  Pt was able to indicate deficits and problem solve simple situations during evaluation.  Recommend ST f/u in acute setting with further needs indicated post d/c for cognitive/linguistic deficits.  ST will continue in acute setting for above mentioned deficits.  Thank you for this consult.    SLP Assessment  SLP Recommendation/Assessment: Patient needs continued Speech Language Pathology Services SLP Visit Diagnosis: Dysarthria and anarthria (R47.1);Cognitive communication deficit (R41.841)    Recommendations for follow up therapy are one component of a multi-disciplinary discharge planning process, led by the attending physician.  Recommendations may be updated based on patient status, additional functional criteria and insurance authorization.    Follow Up Recommendations  Follow physician's recommendations for discharge plan and follow up therapies    Assistance Recommended at Discharge  Other (comment) (TBD)  Functional Status Assessment Patient has had a recent decline in their functional status and demonstrates the ability to make significant improvements in function in a reasonable and predictable amount of time.  Frequency and Duration min 2x/week  1 week      SLP Evaluation Cognition  Overall Cognitive Status: Impaired/Different from baseline Arousal/Alertness: Awake/alert Orientation Level: Oriented X4 Attention: Sustained Sustained Attention: Impaired Sustained Attention Impairment: Verbal basic;Functional  basic Memory: Impaired Memory Impairment: Decreased recall of new information;Decreased short term memory Decreased Short Term Memory: Verbal basic;Functional basic Awareness: Appears intact Problem Solving: Appears intact Behaviors: Other (comment) (anxious? c/o SOB) Safety/Judgment: Appears intact Comments: stated he needs a cane when ambulatory, needs A from Child psychotherapist for homeless situation       Comprehension  Auditory Comprehension Overall Auditory Comprehension: Appears within functional limits for tasks assessed Yes/No Questions: Within Functional Limits Commands: Within Functional Limits Conversation: Complex Interfering Components: Attention EffectiveTechniques: Repetition Visual Recognition/Discrimination Discrimination: Within Function Limits Reading Comprehension Reading Status: Within funtional limits (for environmental signs only)    Expression Expression Primary Mode of Expression: Verbal Verbal Expression Overall Verbal Expression: Impaired Initiation: No impairment Level of Generative/Spontaneous Verbalization: Conversation Naming: Impairment Responsive: Not tested Confrontation: Within functional limits Convergent: 50-74% accurate Divergent: 50-74% accurate Interfering Components: Attention;Premorbid deficit Non-Verbal Means of Communication: Not applicable Written Expression Dominant Hand: Right Written Expression: Within Functional Limits   Oral / Motor  Oral Motor/Sensory Function Overall Oral Motor/Sensory Function: Mild impairment Facial ROM: Reduced left Facial Symmetry: Abnormal symmetry left Facial Strength: Within Functional Limits Facial Sensation: Reduced left Lingual ROM: Within Functional Limits Lingual Symmetry: Within Functional Limits Lingual Strength: Within Functional Limits Lingual Sensation: Within Functional Limits Motor Speech Overall Motor Speech: Appears within functional limits for tasks assessed Respiration: Within  functional limits Phonation: Normal Resonance: Within functional limits Articulation: Impaired Level of Impairment: Conversation Intelligibility: Intelligibility  reduced Word: 75-100% accurate Phrase: 75-100% accurate Sentence: 75-100% accurate Conversation: 75-100% accurate Motor Planning: Witnin functional limits Motor Speech Errors: Not applicable Interfering Components: Premorbid status Effective Techniques: Increased vocal intensity            Pat Braylon Lemmons,M.S.,CCC-SLP 06/10/2023, 9:24 AM

## 2023-06-10 NOTE — Evaluation (Signed)
Occupational Therapy Evaluation Patient Details Name: Kenneth Morales MRN: [id number] DOB: [birth year] Today's Date: 06/10/2023   History of Present Illness   Pt is a 58 yo male presenting to [facility] ED with slurred speech and impaired balance. 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     Clinical Impressions PTA pt lived with brother, he reports things at home were going fine but brother reports it was "so-so." Pt was furniture walking in home and reports being Mod I for ADLs but does have a hx of falls and he reports a hx of diplopia. Pt currently CGA + RW, BUE coordination and strength intact, no vision troubles at this time, but does exhibit decreased attention. Advised pt to follow-up with eye doctor, educated pt and family on [employer] stroke education, Pt brother expressed challenges with caring for pt himself (notified CSW). Pt would benefit from post acute outpatient Neuro OT, acute OT to continue following pt acutely to address listed deficits and help transition to next level of care.      If plan is discharge home, recommend the following:   Assist for transportation;Assistance with cooking/housework;Help with stairs or ramp for entrance;Direct supervision/assist for medications management     Functional Status Assessment   Patient has had a recent decline in their functional status and demonstrates the ability to make significant improvements in function in a reasonable and predictable amount of time.     Equipment Recommendations   BSC/3in1;Tub/shower bench     Recommendations for Other Services         Precautions/Restrictions         Mobility Bed Mobility Overal bed mobility: Needs Assistance Bed Mobility: Supine to Sit     Supine to sit: Supervision          Transfers Overall transfer level: Needs assistance Equipment used: None Transfers: Sit to/from Stand Sit  to Stand: Contact guard assist           General transfer comment: RW for ambulation      Balance Overall balance assessment: Needs assistance Sitting-balance support: Feet supported, No upper extremity supported Sitting balance-Leahy Scale: Good     Standing balance support: Bilateral upper extremity supported, During functional activity Standing balance-Leahy Scale: Poor Standing balance comment: Needs UE support                           ADL either performed or assessed with clinical judgement   ADL Overall ADL's : Needs assistance/impaired Eating/Feeding: Independent;Sitting   Grooming: Standing;Contact guard assist   Upper Body Bathing: Contact guard assist;Standing   Lower Body Bathing: Supervison/ safety;Sitting/lateral leans   Upper Body Dressing : Set up;Sitting   Lower Body Dressing: Supervision/safety;Sitting/lateral leans   Toilet Transfer: Ambulation;Contact guard assist Toilet Transfer Details (indicate cue type and reason): Pt with hx of not making it to the bathroom in time Toileting- Clothing Manipulation and Hygiene: Contact guard assist;Sit to/from stand       Functional mobility during ADLs: Contact guard assist;Rolling walker (2 wheels)       Vision   Vision Assessment?: Yes Eye Alignment: Within Functional Limits Ocular Range of Motion: Within Functional Limits Alignment/Gaze Preference: Within Defined Limits Tracking/Visual Pursuits: Able to track stimulus in all quads without difficulty Saccades: Other (comment) (pt having trouble fully attending to task) Depth Perception:  [facility]) Additional Comments: Hx of diplopia, not present  in today's session     Perception         Praxis         Pertinent Vitals/Pain Pain Assessment Pain Assessment: No/denies pain     Extremity/Trunk Assessment Upper Extremity Assessment Upper Extremity Assessment: Overall WFL for tasks assessed;Right hand dominant (R>L. FMC/GMC intact  BUEs)   Lower Extremity Assessment Lower Extremity Assessment: Defer to PT evaluation       Communication Communication Communication: No apparent difficulties   Cognition Arousal: Alert Behavior During Therapy: Flat affect Cognition: Cognition impaired     Awareness: Intellectual awareness intact, Online awareness impaired   Attention impairment (select first level of impairment): Focused attention   OT - Cognition Comments: Attentive to Tv and others in hall vs attending to therapist                 Following commands: Intact       Cueing  General Comments          Exercises     Shoulder Instructions      Home Living Family/patient expects to be discharged to:: Other (Comment) Living Arrangements: Other relatives (brother) Available Help at Discharge: Family;Available PRN/intermittently Type of Home: Other(Comment) ([employer])             Bathroom Shower/Tub: Chief Strategy Officer: Standard     Home Equipment: Cane - single point;Rollator (4 wheels);Shower seat   Additional Comments: Hx of falls, did not state number but a couple were in shower      Prior Functioning/Environment Prior Level of Function : History of Falls (last six months)             Mobility Comments: reporst he furniture/wall surfs at home ADLs Comments: Mod I ADLs, brother assist wtih iADLS, medicaid transport    OT Problem List: Impaired balance (sitting and/or standing);Decreased safety awareness   OT Treatment/Interventions: Self-care/ADL training;DME and/or AE instruction;Therapeutic activities;Balance training;Therapeutic exercise;Patient/family education      OT Goals(Current goals can be found in the care plan section)   Acute Rehab OT Goals Patient Stated Goal: TO have help at home OT Goal Formulation: With patient/family Time For Goal Achievement: 06/24/23 Potential to Achieve Goals: Good   OT Frequency:  Min 1X/week    Co-evaluation               AM-PAC OT "6 Clicks" Daily Activity     Outcome Measure Help from another person eating meals?: None Help from another person taking care of personal grooming?: A Little Help from another person toileting, which includes using toliet, bedpan, or urinal?: A Little Help from another person bathing (including washing, rinsing, drying)?: A Little Help from another person to put on and taking off regular upper body clothing?: A Little Help from another person to put on and taking off regular lower body clothing?: A Little 6 Click Score: 19   End of Session Equipment Utilized During Treatment: Rolling walker (2 wheels) Nurse Communication: Mobility status  Activity Tolerance: Patient tolerated treatment well Patient left: in bed;with call bell/phone within reach;Other (comment);with family/visitor present (sitting EOB with MD at bedside)  OT Visit Diagnosis: Unsteadiness on feet (R26.81);History of falling (Z91.81);Cognitive communication deficit (R41.841) Symptoms and signs involving cognitive functions: Cerebral infarction (R pons infarct and R punctate infarct)                Time: 6045-4098 OT Time Calculation (min): 15 min Charges:  OT General Charges $OT Visit: 1 Visit OT Evaluation $  OT Eval Low Complexity: 1 Low  06/10/2023  AB, OTR/L  Acute Rehabilitation Services  Office: [phone]   Tristan Schroeder 06/10/2023, 11:14 AM

## 2023-06-10 NOTE — Consult Note (Signed)
NEUROLOGY CONSULT NOTE   Date of service: June 10, 2023 Patient Name: Kenneth Morales MRN:  [id number] DOB:  May 27, 1965 Chief Complaint: "Slurred speech" Requesting Provider: Nolberto Hanlon, MD  History of Present Illness  Kenneth Morales is a 58 y.o. 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  He states that he last was in his normal state of health when he went to bed last night.  He went to bed around midnight.  Today, his son noticed that he was having difficulty walking and therefore insisted that he come to get checked out in the emergency department.  LKW: Midnight IV Thrombolysis: No, outside of window EVT: No, no LVO  NIHSS components Score: Comment  1a Level of Conscious 0[x]  1[]  2[]  3[]      1b LOC Questions 0[x]  1[]  2[]       1c LOC Commands 0[x]  1[]  2[]       2 Best Gaze 0[x]  1[]  2[]       3 Visual 0[x]  1[]  2[]  3[]      4 Facial Palsy 0[x]  1[]  2[]  3[]      5a Motor Arm - left 0[]  1[x]  2[]  3[]  4[]  UN[]    5b Motor Arm - Right 0[x]  1[]  2[]  3[]  4[]  UN[]    6a Motor Leg - Left 0[]  1[x]  2[]  3[]  4[]  UN[]    6b Motor Leg - Right 0[x]  1[]  2[]  3[]  4[]  UN[]    7 Limb Ataxia 0[x]  1[]  2[]  3[]  UN[]     8 Sensory 0[]  1[x]  2[]  UN[]      9 Best Language 0[x]  1[]  2[]  3[]      10 Dysarthria 0[]  1[x]  2[]  UN[]      11 Extinct. and Inattention 0[x]  1[]  2[]       TOTAL: 4      Past History   Past Medical History:  Diagnosis Date   Acute MI (HCC) MAY 2013   CAD (coronary artery disease)    NSTEMI in 05/13 in setting of Cocaine use. Cath: 99% mid LCX stenosis with a large thrombus. PCI and BMS (4.0 X15 mm Vision) placement to mid LCX, LAD: 20%, RCA: 30%, EF: 60%.    Cocaine abuse (HCC)    quit in 08/2011   Depression    Hyperlipidemia    Hypertension    Spider bite    Tobacco abuse     Past Surgical History:  Procedure Laterality Date   CARDIAC CATHETERIZATION   MAY 2013   s/p stent @ [facility]   CARDIAC CATHETERIZATION     CORONARY CTO INTERVENTION N/A 01/27/2018   Procedure: CORONARY CTO INTERVENTION;  Surgeon: Corky Crafts, MD;  Location: [facility] INVASIVE CV LAB;  Service: Cardiovascular;  Laterality: N/A;   CORONARY STENT INTERVENTION N/A 11/29/2017   Procedure: CORONARY STENT INTERVENTION;  Surgeon: Alwyn Pea, MD;  Location: [facility] INVASIVE CV LAB;  Service: Cardiovascular;  Laterality: N/A;   CORONARY STENT INTERVENTION N/A 01/27/2018   Procedure: CORONARY STENT INTERVENTION;  Surgeon: Corky Crafts, MD;  Location: [facility] INVASIVE CV LAB;  Service: Cardiovascular;  Laterality: N/A;  mid cfx   HEMORRHOID SURGERY     LEFT HEART CATH AND CORONARY ANGIOGRAPHY Right 11/29/2017   Procedure: Left heart catheterization with possible PCI;  Surgeon: Laurier Nancy, MD;  Location: [facility] INVASIVE CV LAB;  Service: Cardiovascular;  Laterality: Right;   LEFT HEART CATH AND CORONARY ANGIOGRAPHY N/A 01/27/2018   Procedure: LEFT  HEART CATH AND CORONARY ANGIOGRAPHY;  Surgeon: Corky Crafts, MD;  Location: [facility] INVASIVE CV LAB;  Service: Cardiovascular;  Laterality: N/A;   LEFT HEART CATH AND CORONARY ANGIOGRAPHY N/A 11/15/2018   Procedure: LEFT HEART CATH AND CORONARY ANGIOGRAPHY and possible pci and stent;  Surgeon: Alwyn Pea, MD;  Location: [facility] INVASIVE CV LAB;  Service: Cardiovascular;  Laterality: N/A;    Family History: Family History  Problem Relation Age of Onset   Heart attack Maternal Grandfather    Heart attack Paternal Grandfather     Social History  reports that he has been smoking cigarettes. He has a 15 pack-year smoking history. He has never used smokeless tobacco. He reports current alcohol use of about 6.0 standard drinks of alcohol per week. He reports that he does not use drugs.  Allergies  Allergen Reactions   Brilinta [Ticagrelor] Shortness Of Breath   Penicillins Rash    Has patient had a PCN reaction causing  immediate rash, facial/tongue/throat swelling, SOB or lightheadedness with hypotension: Yes Has patient had a PCN reaction causing severe rash involving mucus membranes or skin necrosis: No Has patient had a PCN reaction that required hospitalization: No Has patient had a PCN reaction occurring within the last 10 years: No If all of the above answers are "NO", then may proceed with Cephalosporin use.    Medications   Current Facility-Administered Medications:     stroke: early stages of recovery book, , Does not apply, Once, Nolberto Hanlon, MD   acetaminophen (TYLENOL) tablet 650 mg, 650 mg, Oral, Q4H PRN **OR** acetaminophen (TYLENOL) 160 MG/5ML solution 650 mg, 650 mg, Per Tube, Q4H PRN **OR** acetaminophen (TYLENOL) suppository 650 mg, 650 mg, Rectal, Q4H PRN, Nolberto Hanlon, MD   aspirin chewable tablet 81 mg, 81 mg, Oral, Daily, Nolberto Hanlon, MD, 81 mg at 06/09/23 2204   atorvastatin (LIPITOR) tablet 80 mg, 80 mg, Oral, q1800, Nolberto Hanlon, MD   clopidogrel (PLAVIX) tablet 75 mg, 75 mg, Oral, Daily, Nolberto Hanlon, MD, 75 mg at 06/09/23 2203   labetalol (NORMODYNE) injection 20 mg, 20 mg, Intravenous, Q2H PRN, Nolberto Hanlon, MD   nicotine (NICODERM CQ - dosed in mg/24 hours) patch 14 mg, 14 mg, Transdermal, Daily, Nolberto Hanlon, MD, 14 mg at 06/09/23 2204   senna-docusate (Senokot-S) tablet 1 tablet, 1 tablet, Oral, QHS PRN, Nolberto Hanlon, MD  Vitals   Vitals:   06/09/23 1754 06/09/23 2143 06/09/23 2315 06/10/23 0000  BP: (!) 188/108 (!) 186/105 (!) 180/112 (!) 193/110  Pulse: 72 72 70 66  Resp: 12 18 18 20   Temp: 98.4 F (36.9 C) 98.5 F (36.9 C) 98.8 F (37.1 C) 98.5 F (36.9 C)  TempSrc: Oral Oral Oral Oral  SpO2: 100% 93%  99%  Weight: 72.6 kg     Height: 5\' 10"  (1.778 m)       Body mass index is 22.96 kg/m.  Physical Exam   Constitutional: Appears well-developed and well-nourished.   Neurologic Examination    Neuro: Mental Status: Patient is awake, alert, oriented to person,  place, month, year, and situation. Patient is able to give a clear and coherent history. No signs of aphasia or neglect Cranial Nerves: II: Visual Fields are full. Pupils are equal, round, and reactive to light.   III,IV, VI: EOMI without ptosis or diploplia.  V: Facial sensation is symmetric to temperature VII: Facial movement is symmetric.  VIII: hearing is intact to voice X: Uvula elevates symmetrically XII: tongue is midline without atrophy or  fasciculations.  Motor: Tone is normal. Bulk is normal. 5/5 strength was present on the right, he has 4/5 on the left, which is baseline Sensory: Sensation is diminished in the left leg, he does extinguish double simultaneous stimulation Cerebellar: FNF and HKS are intact, right, consistent with mild weakness in the left        Labs/Imaging/Neurodiagnostic studies   CBC:  Recent Labs  Lab Jun 30, 2023 1541 06-30-2023 1603  WBC 9.3  --   NEUTROABS 7.0  --   HGB 15.7 16.0  HCT 46.8 47.0  MCV 93.0  --   PLT 252  --    Basic Metabolic Panel:  Lab Results  Component Value Date   NA 143 06/30/2023   K 4.2 06-30-2023   CO2 26 06-30-23   GLUCOSE 86 06-30-23   BUN 13 30-Jun-2023   CREATININE 1.20 June 30, 2023   CALCIUM 9.2 06/30/2023   GFRNONAA >60 June 30, 2023   GFRAA >60 11/14/2018   Lipid Panel:  Lab Results  Component Value Date   LDLCALC 137 (H) 02/04/2023   HgbA1c:  Lab Results  Component Value Date   HGBA1C 5.5 02/03/2023   Urine Drug Screen:     Component Value Date/Time   LABOPIA NONE DETECTED 02/04/2023 0035   LABOPIA POSITIVE (A) 01/27/2018 1531   COCAINSCRNUR POSITIVE (A) 02/04/2023 0035   LABBENZ NONE DETECTED 02/04/2023 0035   LABBENZ NONE DETECTED 01/27/2018 1531   AMPHETMU NONE DETECTED 02/04/2023 0035   AMPHETMU NONE DETECTED 01/27/2018 1531   THCU NONE DETECTED 02/04/2023 0035   THCU NONE DETECTED 01/27/2018 1531   LABBARB NONE DETECTED 02/04/2023 0035   LABBARB NONE DETECTED 01/27/2018 1531     Alcohol Level     Component Value Date/Time   ETH <10 06-30-23 1541   INR  Lab Results  Component Value Date   INR 1.0 Jun 30, 2023   APTT  Lab Results  Component Value Date   APTT 31 06-30-23   AED levels: No results found for: "PHENYTOIN", "ZONISAMIDE", "LAMOTRIGINE", "LEVETIRACETA"  MRI Brain(Personally reviewed): Several punctate strokes  ASSESSMENT   Kenneth Morales is a 58 y.o. male with acute infarct in the setting of cocaine use.  My suspicion is that this is likely related to the cocaine use, and I strongly encouraged him to quit.  Secondary risk factor modification including LDL optimization, and dual antiplatelet therapy, etc. should also be pursued.  RECOMMENDATIONS  Aspirin 81 mg and Plavix 75 mg daily for 3 weeks followed by aspirin monotherapy Lipids, A1c Echo, telemetry PT, OT, ST Permissive hypertension I encouraged smoking cessation as well as cocaine cessation Stroke team to follow ______________________________________________________________________    Signed, Ritta Slot, MD [facility]

## 2023-06-10 NOTE — Discharge Instructions (Addendum)
Dear Kenneth Morales,   Congratulations for your interest in quitting smoking!  Find a program that suits you best: when you want to quit, how you need support, where you live, and how you like to learn.    If you're ready to get started TODAY, consider scheduling a visit through [facility] @ .com/quit.  Appointments are available from 8am to 8pm, Monday to Friday.   Most health insurance plans will cover some level of tobacco cessation visits and medications.    Additional Resources: [employer] are also available to help you quit & provide the support you'll need. Many programs are available in both [place] and Spanish and have a long history of successfully helping people get off and stay off tobacco.    Quit Smoking Apps:  quitSTART at SeriousBroker.de QuitGuide?at ForgetParking.dk Online education and resources: Smokefree  at Borders Group.gov Free Telephone Coaching: QuitNow,  Call 1-800-QUIT-NOW (774-751-7637) or Text- Ready to (201) 293-0954 *Quitline [place] has teamed up with Medicaid to offer a free 14 week program    Vaping- Want to Quit? Free 24/7 support. Call [facility]  [place], [place], [place], [place], [place]  Kenneth Morales  [employer] Guide Shelters The [place] "B3979455" is a great source of information about community services available.  Access by dialing 2-1-1 from anywhere in [place], or by website -  PooledIncome.pl.   Other Armed forces technical officer Number and Address  [facility] Housing for homeless and needy men with substance abuse issues 5 day Covid Quarantine [phone] [address] [place], [place]  [employer]man [employer] of [place] Emergency assistance for [employer] Mills only [employer] [phone] [address] [place], [place]  Clara Brunswick Corporation of the [place] Domestic violence shelter for women  and their children [phone])[phone] [place], [place]  [facility] Domestic violence shelter for women and their children Each family gets their own unit and can quarantine after admission. [phone] [place], [place]  [facility]) / Resources for the [employer] center for the homeless Information and referral to housing resources Counseling Showers Laundry Barbershop Phone bank Mailroom Computer lab Medical clinic Bike maintenance center 14 Day covid quarantine [phone] [address] [place], [place]  [facility] - [employer] Men's Shelter Emergency housing Food Emergency financial assistance Permanent supportive housing [phone] [address] [place], [place]  The [employer] Stores Crisis assistance Medication Housing Food Utility assistance [phone] [address] [place], [place]   [phone] [address] [place], [place]  The [employer] Company of [place]       Transitional housing Case Chartered certified accountant assistance [phone] [address] [place], [place]  [facility], [employer]es for adult men and women Can admit with MD clearance from the hospital after positive covid test.  Intake Hotline [phone] [address] [place], [place]  24-hour Crisis Line for those Facing Homelessness   Information and referral to community resources [phone]  [employer] and additional resources. Can admit with MD clearance after positive covid test.  Prefer online applications (blocked on Cone computers)/ currently full. Will be able to do intake at the office starting in May.  [phone] Admin only location     [facility]) now has a full time staff to take referrals for all individuals needing shelter or housing placement. They do not do direct services  or have beds,  but are in charge of assessing and coordinating placement for individuals needing shelter. The phone number for coordinated entry is [phone].  Outpatient Programs   Narcotics Anonymous 24-HOUR HELPLINE Pre-recorded for Meeting Schedules [facility] [phone]  WWW.PIEDMONTNA.COM ALCOHOLICS ANONYMOUS  [facility]  Answering Service [phone] Please Note: All [place] Meetings are Non-smoking TonerProviders.com.cy     [place]ina.Electrical engineer agencies for assistance with funding  [facility] for [facility] [address] [place], [place], [place] [address] Phone: [phone]  [employer] [phone]- crisis line  [facility] (statewide facilities/programs) [address] (Medicaid/state funds) [place], [place] [address] [phone] Kenneth Morales- [phone] [place]- [phone] Kenneth [phone]! [email or web address]  [facility]HA Pharmacist, community (Medicaid/state funds) Crisis line [phone] HIGH [employer] [phone] [place] -[phone] [address] [phone]   [facility] Services of the [place] (2 Locations) (Medicaid/state funds) --[address]  walk in 8:30-12 and 1-2:30 [place], [id number]   [facility]- [phone] --[address] [place], [place] [address]  KX-381 [phone] walk in 8:30-12 and 2-3:30                         [facility] Substance Use and Mental Health Crisis Center-alternative to emergency rooms, jails or the streets. OPEN [address] HOURS EVERY DAY [address] [place], [place] [address]  [phone]  [place]- [address] [place], [place] [address]  [phone] (24 hours)  [place]- [address] [name] [phone]  [facility]- [address] [place] [phone]                     7 day detox [phone] and [facility] [address] [place], [place] [address] [phone] (24 hours)  [place]- [address] [place], [place]  [address] [phone]  [facility]- [address] [place], [place] [address] [phone] (24 hours)  [place] [address] [place], [place]  [address] [phone]  Turner Daniels- [address] [place] [phone]  [place]- [address] [place] [phone] Medicaid and state funds  Alcohol and Drug Services -  Insurance: Medicaid /State funding/private insurance Methadone, suboxone  [place] [phone] Fax: [phone] [address], [place], [place], [address] [facility] http://www.[email or web address]/ Types of Program: Transitional housing, IOP, Outpatient treatment, [employer] funding/Medicaid Phone: [phone] Fax: [phone] Address: [address] [place] [address]  [facility], [place], most private insurance providers [address] [place], [place] [address] [phone] Kenneth Morales Addiction and internal medicine clinic Private insurance/Medicare/private pay Outpatient/suboxone/general medicine [address] [place], [place] [address]   [phone]  (Suboxone)  [facility] (MH/SA) Medicaid  [address]  [place], [place] [address] [phone]   Alcohol/drug Council of [place] Information and referral for programs for pregnant women [phone] [facility] Text  [phone]  Kenneth Morales 12pm -6pm  [facility]-SAIOP [place] [phone] [facility] [address] [place], [place] [address] [phone]- Substance abuse treatment  [facility] Medication management and therapy, IOP/PHP [address] [place], [place] [address] [phone] (Medicaid/state funds- individual -private insurance-group) [facility]- State funds only MH/SA therapy and med management [address] [place], [place] [address] [phone] / [phone] Crisis Center: [phone]  [facility] Recovery Inpt and outpt- insurance, Medicaid,  self pay  [address]  [place], [place] [address] [phone] [address] [place], [place] [address] [phone]   M-F 8-5  Residential Programs   [facility] Substance Abuse Treatment Center  Insurance: [place], some private insurance Types of Program: Medical Detox, Residential Treatment  for [facility] residents Phone: [phone] Fax: [phone] [address] [place], [place], [place], [address]  Fellowship Margo Aye (SelfDetection.tn) Only private insurance providers or self-pay Types of Program: CDIOP, Extended Treatment, Family Therapy, Group Counseling, Inpatient program, Partial Programming  Phone: [phone] Fax:  [phone] Address: [address], [place], [address]  Dreams Insurance: Accepts those with and without insurance,  [facility] sponsorship  Type of Program: Residential Treatment Phone: [phone]   Address: [address], [place], [place], [address] [facility] (VeganReport.com.au) Insurance: [place] - based on those with income [phone]) and without ($285) Types of Program: 90-Day Program  Phone: [phone] Fax: [phone] Address: [address], [place], [place], [address]   [facility] (http://rivera-kline.com/) Insurance: n/a Types of Program: Residential Treatment Phone: [phone] Fax: [phone] Address: [address], [place], [place], [address]  [facility] (http://www.onlinegreensboro.com/~maryshouse/index.html) Insurance: n/a Types of Program: Residential Treatment, Substance Abuse Treatment for homeless women  Phone: [phone] Fax: [phone] Address: [address] [place], [address]  Teen Challenge (ages 59-40) (www.teenchallengeusa.com) Insurance: n/a; $700 entrance fee, including $50 non-refundable application fee  Types of Program: Residential Treatment Phone: [phone] Fax: [phone] Address: [address] [place], [place],  [address] (Statewide locations) [facility] of [place]  -[place] or [place] [phone]  [employer] (FunnyTan.co.nz)  Insurance: n/a; fees range from [phone] (rent) Types of Program: Sober living for those in recovery Phone: [phone] [facility]) / [phone] (Will) / [phone] Kenneth Morales)    [facility] (women) [address] [place], [place] [address] [phone] FAX [phone]  [address] Work based (manual labor) Residential- minimum 2 years [phone] [address]  [place], [place] [address]  [facility] Insurance: n/a 9-12 month recovery- WOMEN and children [address] [address]  [place], [place] [address]  Phone: [phone] [facility]  WOMEN 1 yr -[place] based- no meds [address] [place], [place] [address]  [phone]  [facility]   Alcohol and Drug Services -  Insurance: Medicaid /State funding/private insurance Methadone, suboxone [address] [place], [place] [address] [phone] Kenneth Morales-  [address]  [place]  [phone] 7 day detox   [facility] (http://www.BargainMaintenance.cz) State funds, self-pay, some private insurance providers Phone: [phone] Fax: [phone] Address: [address] [place], [place], [place], [address] [employer] n/a; work-based program for chronic relapse patients  Types of Program: Residential Treatment  Phone: [phone] Fax: [phone] Address: [address] [place], [place],  [address]  [facility] (www.thefellowshiphome.org)  Insurance: n/a; $75-100/week Types of Program: Individual/Group Therapy, 2-year Relapse Prevention program  Phone: [phone] Fax:  [phone] Address: [address] [place], [place], [address] [facility]  Insurance: n/a Types of Program: Residential Treatment, Housing program, Work Therapy Phone: [phone] Fax: [phone] Address:  [address] [place], [place],  [address]  [employer]: n/a Types of Programs: Residential Treatment Phone: [phone]    Address: [address] [place], [place], [address] [employer] - 6-12 months Cardinal/Medicaid/medicare/disability [address] (main office) [place], [place] [address] [phone]  [facility]   Path of [place] (http://www.http://www.ball-ray.[email or web address]/) Insurance: n/a; clients must  pay a cash fee of $3,220  upfront Types of Program: Residential Treatment Phone: [phone] Fax: [phone] Address: [address] [place], [place], [address] [facility] for [facility] Insurance: n/a; must be able to work on site Types of Program: Residential Treatment Phone: [phone] Kenneth Morales) / [phone] Kenneth Morales) Address: [address] [place], [place], [address]  436 [employer]   [facility] - [employer].H. [employer]- women (http://charlotterescuemission.org/) Insurance: n/a  Types of Programs: Residential Treatment Phone: [phone] Fax: [phone] Address: [address] [place], [place], [address] [facility] Insurance: Medicaid, all private insurance providers not in a network  Types of Program: Drug Education, DUI Treatment, Methadone program, Residential Treatment, Substance Abuse Intensive Outpatient Phone: [phone] Fax: [phone] Address: [address], [place], [place], [address]  [facility]bound - [employer] - Men Insurance: n/a  Types of Program: Residential Treatment Phone: [phone] Fax: [phone] Address: [address] [place], [place], [address] [facility].  Insurance: n/a; the fee for the first 90 days will be waived upon completion of the entire program. Types of Program: Men and Women's, Families Level,     [facility] for the homeless  Phone: [phone] Fax: [phone] Address: [address], [place], [place], [address]  [place]   First at [facility] (ARanked.fi)  Insurance: n/a; [phone] for 30 days,  afterwards clients are expected to work onsite to maintain stay  Types of Program: Aftercare services, Day Program,   (partially funded)  Phone: [phone] Fax: [phone] Address: [address] - [address] - [place], [place]  [address]  [facility] of [place] (http://www.flynnhickory.org/) Insurance: n/a; $250.00/first two weeks and $125/week afterwards Types of Program: Housing for clients who do not suffer from psychological or medical problems Phone: [phone] Fax: [phone] Address: [address] [place], [place], [address]  [facility] (DrawPay.co.nz) Insurance: n/a; $150 entry fee  Types of Program: Therapeutic Community   Phone: [phone] Fax: [phone]  Address: [address], [place], [place], [address] [place] CIGNA: n/a Types of Program: Residential Treatment Phone: [phone] Fax: [phone] Address: [address] [place], [place], [address]  Rec[employer] [facility]  Insurance: $300 entry fee-(some scholarships available) Types of Program: Residential Treatment  Phone: [phone] Fax: [phone] Address: [address], [place], [place], [address] [facility]) Insurance: n/a; sliding scale between $6,000-$12,000 depending on income Types of Program: Residential Treatment Phone: [phone] Fax: [phone] Address: [address] 2nd floor Billie L[place], [place], [address]  Life Challenge of [facility] (women 18-39 yo) 1 year Minimum $400 entrance fee [address] [place], [place] [address] [phone] Fax [phone] [facility] [address] [place] [address] Phone: [phone] Fax [phone] $250 entry fee  [facility] insurance only [address], [place], [place] [address] Phone: [phone]   [place]   [facility] (RainTreatment.es) Insurance: n/a  Types of Program: Emergency overnight stay, Medical Detox,  Recovery program for [facility] residents, 39-month Residential Treatment  Phone: [phone] Fax: [phone] Address: [address], [place], [place], [address] [facility] (http://www.church.org/) Insurance: n/a Types of Program: Residential Treatment  Phone: [phone] Fax: [phone] Address: [address] [place], [address]   [facility], [place], [place] is accepted but will only pay for certain services Types of  Program: Inpatient and Outpatient Treatment Phone: [phone] Fax: [phone]  Address: [address], [place], [place], [address] Other Locations: [facility] [address], [place], [place], [place], [address] [facility] [address], [place], [place], [address] [facility] [place] (Men Only)  Insurance: n/a; $91/week Types of Program: Housing program for men who are employed full time (up to 6 months) Phone: [phone] Fax: [phone] Address: [address] [place], [place], [address]    [facility] (Men Only) -   program is free for 30 days, afterwards residents may stay on site but are required to have a job or be able to pay $55.00/week. Types of Program: Meals, Shelter, Civil Service fast streamer, Pregnancy services Phone: [phone] Fax: [phone] Address: [address] [place],  [address]  [facility] (RTS) Detox/crisis, residential, [address], [place], [place] [address] Phone: [phone] State funding and financial assistance   [facility] women- 6 mos $500 entry fee [address] [place], [place] [address] [phone] [facility] MH/SA opt and residential [address], [place], [place] [address] Theron Arista [place], [place] [address] [phone]  Other/out of state   [facility] Insurance: self pay or cardinal/partners Types of Program: Residential Treatment Phone: Men's Division [phone]                 Women's Division [phone]   Address:  [facility] [address], [place], [place], [address] [employer] (http://owlsnestrecovery.com/) Insurance: n/a; fees range from [phone] Types of Program: Intensive Substance Abuse Rehabilitation Phone: [phone] Fax: [phone]  Address: [address], [place], [place], [address]   [facility] (www.bridgetorecovery.org) $2500.00 for 30 day program Types of Program: Transitional Housing Phone: [phone] Fax: [phone] Address: [address] [place], [place], [address]   His Laboring [employer]  Insurance: n/a Types of Program: Residential Treatment (does not allow behavioral health medications) Phone: [phone] Fax: [phone]  Address: [address] [place], [address]  [facility] [place] (http://galax.[email or web address])  Insurance: Public affairs consultant, private pay [phone] for 28 days) Types of Program: Chronic Pain Recovery, DUI Treatment, Family Therapy, Gender-Specific Therapy, Opiate Recovery, Residential Treatment Phone: [phone] Fax: [phone] Address: [address], [place], [place], [address] [facility] (Alcohol and Drug Addiction Treatment  Center)  Medicaid, Medicare and private insurance  providers; cost of $517.00/day  Types of Program: 7-day Detoxification program and 14-day Rehabilitation program  Phone: [phone]: [phone] Address: [address] [place], [place], [address]  [facility] [employer] locations [employer]ocker only [place] [phone]

## 2023-06-10 NOTE — Evaluation (Signed)
Physical Therapy Evaluation Patient Details Name: Kenneth Morales MRN: [id number] DOB: 08-Apr-1966 Today's Date: 06/10/2023  History of Present Illness  Pt is a 58 yo male presenting to [facility] ED with slurred speech and impaired balance. 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  Clinical Impression  Patient presents with decreased mobility due to generalized weakness, decreased strength/sensation L LE and decreased balance.  Patient stable with ambulation with cane though noted imbalance.  Feel he will benefit from skilled PT in the acute setting and from outpatient PT at d/c.         If plan is discharge home, recommend the following: A little help with walking and/or transfers;Assistance with cooking/housework;Assist for transportation;Help with stairs or ramp for entrance   Can travel by private vehicle        Equipment Recommendations Cane  Recommendations for Other Services       Functional Status Assessment Patient has had a recent decline in their functional status and demonstrates the ability to make significant improvements in function in a reasonable and predictable amount of time.     Precautions / Restrictions Precautions Precautions: Fall      Mobility  Bed Mobility Overal bed mobility: Modified Independent                  Transfers   Equipment used: Straight cane Transfers: Sit to/from Stand Sit to Stand: Supervision           General transfer comment: S for balance, using cane on L initially, cues to place in R hand    Ambulation/Gait Ambulation/Gait assistance: Supervision, Contact guard assist Gait Distance (Feet): 200 Feet Assistive device: Straight cane Gait Pattern/deviations: Step-through pattern, Decreased stride length, Decreased dorsiflexion - left       General Gait Details: slowed pace, occasional catch foot on L though no LOB, using cane  appropritely in R hand without cues, CGA for safety progressing to S  Stairs Stairs: Yes Stairs assistance: Supervision Stair Management: One rail Left, Alternating pattern, Forwards, With cane Number of Stairs: 2 (x 2 reps) General stair comments: slowed to lower L foot first on descent  Wheelchair Mobility     Tilt Bed    Modified Rankin (Stroke Patients Only) Modified Rankin (Stroke Patients Only) Pre-Morbid Rankin Score: Moderate disability Modified Rankin: Moderately severe disability     Balance Overall balance assessment: Needs assistance Sitting-balance support: Feet supported Sitting balance-Leahy Scale: Good     Standing balance support: No upper extremity supported Standing balance-Leahy Scale: Fair Standing balance comment: standing initially no device, used cane for ambulation                             Pertinent Vitals/Pain Pain Assessment Pain Assessment: Faces Faces Pain Scale: Hurts little more Pain Location: headache after ambulation Pain Descriptors / Indicators: Headache Pain Intervention(s): Monitored during session, Patient requesting pain meds-RN notified    Home Living Family/patient expects to be discharged to:: Shelter/Homeless Living Arrangements: Other relatives (brother) Available Help at Discharge: Family;Available PRN/intermittently Type of Home: Other(Comment) (staying at hotel)           Home Equipment: Gilmer Mor - single point;Rollator (4 wheels);Shower seat Additional Comments: states lost his cane    Prior Function Prior Level of Function : History of Falls (last six months)  Mobility Comments: reporst he furniture/wall surfs at home ADLs Comments: Mod I ADLs, brother assist wtih iADLS, medicaid transport     Extremity/Trunk Assessment   Upper Extremity Assessment Upper Extremity Assessment: Defer to OT evaluation    Lower Extremity Assessment Lower Extremity Assessment: LLE  deficits/detail LLE Deficits / Details: history of decreased sensation on the L and noted some slower coordination LLE Sensation: decreased light touch LLE Coordination: decreased fine motor       Communication   Communication Communication: No apparent difficulties    Cognition Arousal: Alert Behavior During Therapy: WFL for tasks assessed/performed   PT - Cognitive impairments: No apparent impairments                       PT - Cognition Comments: noted deficits per SLP evaluation but WNL for PT Following commands: Intact       Cueing       General Comments General comments (skin integrity, edema, etc.): brother present and supportive, BP initially 169/108 HR 73, SpO2 96% on RA; after ambulation 180/107; RN aware; reviewed fall prevention information with pt    Exercises     Assessment/Plan    PT Assessment Patient needs continued PT services  PT Problem List Decreased mobility;Decreased coordination;Decreased activity tolerance;Decreased balance;Impaired sensation       PT Treatment Interventions DME instruction;Therapeutic exercise;Gait training;Balance training;Stair training;Functional mobility training;Therapeutic activities;Patient/family education    PT Goals (Current goals can be found in the Care Plan section)  Acute Rehab PT Goals Patient Stated Goal: to get social work help for housing PT Goal Formulation: With patient/family Time For Goal Achievement: 06/24/23 Potential to Achieve Goals: Good    Frequency Min 1X/week     Co-evaluation               AM-PAC PT "6 Clicks" Mobility  Outcome Measure Help needed turning from your back to your side while in a flat bed without using bedrails?: A Little Help needed moving from lying on your back to sitting on the side of a flat bed without using bedrails?: A Little Help needed moving to and from a bed to a chair (including a wheelchair)?: A Little Help needed standing up from a chair using  your arms (e.g., wheelchair or bedside chair)?: A Little Help needed to walk in hospital room?: A Little Help needed climbing 3-5 steps with a railing? : A Little 6 Click Score: 18    End of Session Equipment Utilized During Treatment: Gait belt Activity Tolerance: Patient tolerated treatment well Patient left: in bed;with call bell/phone within reach;with bed alarm set;with family/visitor present   PT Visit Diagnosis: Other abnormalities of gait and mobility (R26.89)    Time: 1000-1025 PT Time Calculation (min) (ACUTE ONLY): 25 min   Charges:   PT Evaluation $PT Eval Moderate Complexity: 1 Mod PT Treatments $Gait Training: 8-22 mins PT General Charges $$ ACUTE PT VISIT: 1 Visit         Sheran Lawless, PT Acute Rehabilitation Services Office:[phone] 06/10/2023   Elray Mcgregor 06/10/2023, 11:49 AM

## 2023-06-10 NOTE — Progress Notes (Signed)
Pt admitted from ED with stroke diagnosis, pt alert and oriented, denies any pain at this time, settled in bed with call light at bedside, tele monitor put and verified on pt, safety concern explained and initiated as well, was however reassured and will continue to monitor. Obasogie-Asidi, Jamar Casagrande Efe

## 2023-06-10 NOTE — Progress Notes (Addendum)
STROKE TEAM PROGRESS NOTE   INTERIM HISTORY/SUBJECTIVE Patient has been hemodynamically stable with blood pressures on the high side.  He is seeing PT and OT, and they recommend outpatient therapy for him. He presented with speech difficulties and gait imbalance.  MRI scan shows small right pontine and cerebellar peduncle infarct.  Urine drug screen is positive for cocaine.  Echocardiogram shows mild in nature and ejection fraction of 40 to 45%.  OBJECTIVE  CBC    Component Value Date/Time   WBC 9.3 06/09/2023 1541   RBC 5.03 06/09/2023 1541   HGB 16.0 06/09/2023 1603   HGB 17.2 08/13/2014 0742   HCT 47.0 06/09/2023 1603   HCT 50.4 08/13/2014 0742   PLT 252 06/09/2023 1541   PLT 241 08/13/2014 0742   MCV 93.0 06/09/2023 1541   MCV 97 08/13/2014 0742   MCH 31.2 06/09/2023 1541   MCHC 33.5 06/09/2023 1541   RDW 13.5 06/09/2023 1541   RDW 13.4 08/13/2014 0742   LYMPHSABS 1.6 06/09/2023 1541   LYMPHSABS 2.2 10/05/2012 0108   MONOABS 0.6 06/09/2023 1541   MONOABS 0.6 10/05/2012 0108   EOSABS 0.0 06/09/2023 1541   EOSABS 0.2 10/05/2012 0108   BASOSABS 0.0 06/09/2023 1541   BASOSABS 0.1 10/05/2012 0108    BMET    Component Value Date/Time   NA 143 06/09/2023 1603   NA 140 08/13/2014 0742   K 4.2 06/09/2023 1603   K 3.9 08/13/2014 0742   CL 106 06/09/2023 1603   CL 102 08/13/2014 0742   CO2 26 06/09/2023 1541   CO2 28 08/13/2014 0742   GLUCOSE 86 06/09/2023 1603   GLUCOSE 93 08/13/2014 0742   BUN 13 06/09/2023 1603   BUN 18 08/13/2014 0742   CREATININE 1.20 06/09/2023 1603   CREATININE 1.12 08/13/2014 0742   CALCIUM 9.2 06/09/2023 1541   CALCIUM 9.7 08/13/2014 0742   GFRNONAA >60 06/09/2023 1541   GFRNONAA >60 08/13/2014 0742    IMAGING past 24 hours DG CHEST PORT 1 VIEW Result Date: 06/09/2023 CLINICAL DATA:  Stroke EXAM: PORTABLE CHEST 1 VIEW COMPARISON:  08/30/2021 FINDINGS: Two frontal views of the chest demonstrate a stable cardiac silhouette. No acute  airspace disease, effusion, or pneumothorax. No acute bony abnormalities. IMPRESSION: 1. No acute intrathoracic process. Electronically Signed   By: Sharlet Salina M.D.   On: 06/09/2023 20:30   CT ABDOMEN PELVIS W CONTRAST Result Date: 06/09/2023 CLINICAL DATA:  Acute abdominal pain with nausea and vomiting EXAM: CT ABDOMEN AND PELVIS WITH CONTRAST TECHNIQUE: Multidetector CT imaging of the abdomen and pelvis was performed using the standard protocol following bolus administration of intravenous contrast. RADIATION DOSE REDUCTION: This exam was performed according to the departmental dose-optimization program which includes automated exposure control, adjustment of the mA and/or kV according to patient size and/or use of iterative reconstruction technique. CONTRAST:  75mL OMNIPAQUE IOHEXOL 350 MG/ML SOLN COMPARISON:  11/29/2017 FINDINGS: Despite efforts by the technologist and patient, motion artifact is present on today's exam and could not be eliminated. This reduces exam sensitivity and specificity. Lower chest: Emphysema. Hepatobiliary: Mm hypodense lesion in segment 3 of the liver on image 9 series 2, previously 4 mm on 11/29/2017, considered benign. No further imaging workup of this lesion is indicated. Similarly a 4 mm hypodense lesion in the right hepatic lobe on image 23 series 2 is present in 2019 and is considered benign. Gallbladder not observed, presumed surgically absent. No biliary dilatation. Pancreas: Unremarkable Spleen: Unremarkable Adrenals/Urinary Tract: Unremarkable Stomach/Bowel: Mild  rugal fold prominence in the fundus and gastric body, probably incidental, an without discrete mass identified. Sigmoid colon diverticulosis. Anastomotic staple line at the anorectal junction. Scattered diverticula in the descending colon. No dilated small bowel. Vascular/Lymphatic: Atherosclerosis is present, including aortoiliac atherosclerotic disease. Today's exam was not performed as a CT angiogram and  motion artifact limits assessment, but my sense is that there is substantial soft plaque or mural thrombus in the proximal right renal artery. Reproductive: Mild prostatomegaly. Other: No supplemental non-categorized findings. Musculoskeletal: Mild to moderate degenerative hip arthropathy bilaterally. IMPRESSION: 1. A specific cause for the patient's acute abdominal pain is not identified. 2. Colonic diverticulosis. 3. Mild prostatomegaly. 4. Mild to moderate degenerative hip arthropathy bilaterally. 5. Emphysema. 6. Aortic atherosclerosis. Today's exam was not performed as a CT angiogram and motion artifact limits assessment, but my sense is that there is substantial soft plaque or mural thrombus in the proximal right renal artery, potentially causing substantial stenosis. Electronically Signed   By: Gaylyn Rong M.D.   On: 06/09/2023 18:28   CT HEAD WO CONTRAST Result Date: 06/09/2023 CLINICAL DATA:  Transient ischemic attack, numbness in left arm. EXAM: CT HEAD WITHOUT CONTRAST TECHNIQUE: Contiguous axial images were obtained from the base of the skull through the vertex without intravenous contrast. RADIATION DOSE REDUCTION: This exam was performed according to the departmental dose-optimization program which includes automated exposure control, adjustment of the mA and/or kV according to patient size and/or use of iterative reconstruction technique. COMPARISON:  MRI brain 06/09/2023 FINDINGS: Brain: Small focal hypodensity in the left paracentral pons on image 7 series 2, cannot exclude mild chronic ischemic microvascular white matter disease or a small chronic lacunar infarct. Additional small lacunar infarct involving the head of the right caudate nucleus and adjacent anterior limb right internal capsule on image 14 series 2. Remote right posterior watershed distribution infarct. Periventricular white matter and corona radiata hypodensities favor chronic ischemic microvascular white matter disease.  This appears similar to prior. Otherwise, the brainstem, cerebellum, cerebral peduncles, thalamus, basal ganglia, basilar cisterns, and ventricular system appear within normal limits. No intracranial hemorrhage, mass lesion, or acute CVA. Vascular: Unremarkable Skull: Unremarkable Sinuses/Orbits: Unremarkable Other: No supplemental non-categorized findings. IMPRESSION: 1. No acute intracranial findings. 2. Remote right posterior watershed distribution infarct. 3. Small lacunar infarcts involving the head of the right caudate nucleus and adjacent anterior limb right internal capsule. 4. Periventricular white matter and corona radiata hypodensities favor chronic ischemic microvascular white matter disease. 5. Small focal hypodensity in the left paracentral pons, cannot exclude mild chronic ischemic microvascular white matter disease or a small chronic lacunar infarct. Electronically Signed   By: Gaylyn Rong M.D.   On: 06/09/2023 18:20   MR BRAIN WO CONTRAST Result Date: 06/09/2023 CLINICAL DATA:  Neuro deficit, acute, stroke suspected EXAM: MRI HEAD WITHOUT CONTRAST TECHNIQUE: Multiplanar, multiecho pulse sequences of the brain and surrounding structures were obtained without intravenous contrast. COMPARISON:  Brain MR 04/16/2023 FINDINGS: Brain: Punctate acute infarct of the brachium pontis on the right (series 2, image 13). There is also an acute infarcts in the right hemi pons (series 2, image 18). Compared to prior exam there is new small left thalamic microhemorrhage. There is a background of moderate to severe chronic microvascular ischemic change. There is chronic infarcts in the right parietal lobe and right caudate head. There is advanced generalized volume loss. Vascular: Normal flow voids. Skull and upper cervical spine: Normal marrow signal. Sinuses/Orbits: No middle ear or mastoid effusion. Paranasal sinuses are clear.  Orbits are unremarkable. Other: None. IMPRESSION: 1. Punctate acute infarct  of the brachium pontis on the right. 2. Linear acute infarct in the right hemi pons. 3. New small left thalamic microhemorrhage. Electronically Signed   By: Lorenza Cambridge M.D.   On: 06/09/2023 17:26    Vitals:   06/10/23 0000 06/10/23 0359 06/10/23 0822 06/10/23 1116  BP: (!) 193/110 (!) 173/90 (!) 167/99 (!) 166/88  Pulse: 66 68 80 63  Resp: 20 18 18 18   Temp: 98.5 F (36.9 C) 98.7 F (37.1 C) 98.8 F (37.1 C) 98.4 F (36.9 C)  TempSrc: Oral Oral Oral Oral  SpO2: 99% 97% 97% 98%  Weight:      Height:         PHYSICAL EXAM General:  Alert, well-nourished, well-developed pleasant middle-age African-American male in no acute distress Psych:  Mood and affect appropriate for situation Respiratory:  Regular, unlabored respirations on room air  NEURO:  Mental Status: AA&Ox3, patient is able to give clear and coherent history Speech/Language: speech is without dysarthria or aphasia.   Cranial Nerves:  II: PERRL. Visual fields full.  III, IV, VI: EOMI. Eyelids elevate symmetrically.  V: Sensation is intact to light touch and symmetrical to face.  VII: Mild left facial droop VIII: hearing intact to voice. IX, X: Phonation is normal.  XII: tongue is midline without fasciculations. Motor: 5/5 strength to all muscle groups tested, but diminished fine finger movements bilaterally and right arm orbiting left Tone: is normal and bulk is normal Sensation- Intact to light touch bilaterally.  Coordination: FTN intact bilaterally Gait- deferred  Most Recent NIH  1a Level of Conscious.: 0 1b LOC Questions: 0 1c LOC Commands: 0 2 Best Gaze: 0 3 Visual: 0 4 Facial Palsy: 1 5a Motor Arm - left: 0 5b Motor Arm - Right: 0 6a Motor Leg - Left: 0 6b Motor Leg - Right: 0 7 Limb Ataxia: 0 8 Sensory: 0 9 Best Language: 0 10 Dysarthria: 0 11 Extinct. and Inatten.: 0 TOTAL: 1   ASSESSMENT/PLAN  Mr. Kenneth Morales is a 58 y.o. male with history of stroke with residual left hemiparesis  and cocaine use admitted for slurred speech and difficulty walking.  Urine toxicology was positive for cocaine.  MRI reveals 2 small infarcts in the right pons NIH on Admission 4  Acute Ischemic Infarct:  right pontine infarcts Etiology: Small vessel disease in the setting of cocaine use  CT head No acute abnormality.  CTA head & neck 10/24, severe stenosis of origin of left AICA, severe stenosis of A2 segment of the left ACA, moderate to severe stenosis of left M2 MCA, moderate to severe stenosis in distal M1 segment of right MCA, severe stenosis of right M2 segment, severe focal stenosis at the origin of the left PICA MRI punctate acute infarct of the brachium pontis on the right, linear acute infarct in the right hemi pons, new small left thalamic microhemorrhage 2D Echo pending LDL 107 HgbA1c 5.2 VTE prophylaxis -SCDs aspirin 81 mg daily and clopidogrel 75 mg daily prior to admission, now on aspirin 81 mg daily and clopidogrel 75 mg daily indefinitely Therapy recommendations:  Outpatient PT/OT/ST Disposition: Ending  Hx of Stroke/TIA Patient has a history of a stroke in December 2023, also history of right PICA territory strokes in March 2020  Hypertension Home meds: Losartan 25 mg daily Stable Blood Pressure Goal: BP less than 220/110   Hyperlipidemia Home meds: Atorvastatin 80 mg daily, resumed in hospital LDL  107, goal < 70 Add ezetimibe 10 mg daily Continue statin at discharge  Tobacco Abuse Patient smokes 0.5 packs per day  Strongly counseled to quit smoking Nicotine replacement therapy provided  Substance Abuse Patient uses cocaine UDS positive for Cocaine Unsure if ready to quit but strongly counseled to stop using cocaine TOC consult for cessation placed  Other Stroke Risk Factors Coronary artery disease   Other Active Problems None  Hospital day # 1  Patient seen by NP with MD, MD to edit note as needed. Cortney E Ernestina Columbia , MSN, AGACNP-BC [facility] See Amion for schedule and pager information 06/10/2023 1:11 PM   STROKE MD NOTE :  I have personally obtained history,examined this patient, reviewed notes, independently viewed imaging studies, participated in medical decision making and plan of care.ROS completed by me personally and pertinent positives fully documented  I have made any additions or clarifications directly to the above note. Agree with note above.  Patient presented with slurred speech and some gait difficulties due to small subcortical and pontine lacunar infarcts likely from small vessel disease in the setting of cocaine abuse.  Patient counseled to quit smoking cigarettes and using cocaine and maintain aggressive risk factor modification.  Recommend aspirin and Plavix long-term given history of symptomatic coronary artery disease and aggressive risk factor modification.  Physical Occupational Therapy consults.  Long discussion with patient and his brother and answered questions. Greater than 50% time during this 50-minute visit was spent in counseling and coordination of care about his lacunar strokes and cocaine abuse and discussion about stroke prevention and treatment and answering questions. Delia Heady, MD Medical Director [facility] Pager: [phone] 06/10/2023 2:42 PM  To contact Stroke Continuity provider, please refer to [email or web address]. After hours, contact General Neurology

## 2023-06-10 NOTE — TOC Initial Note (Signed)
Transition of Care [facility]) - Initial/Assessment Note    Patient Details  Name: Kenneth Morales MRN: [id number] Date of Birth: [birth year]  [facility] CM/SW Contact:    Kermit Balo, RN Phone Number: 06/10/2023, 3:53 PM  Clinical Narrative:                  Pt has been staying in a motel with a friend. They were going to [place], [place] when pt had a stroke.  They dont have a "plan" after dc. Pt did provide CM his mothers number to see if they can stay there at dc: [phone]. CM had to leave a voicemail. Pt will need outpatient arranged and PCS services once d/c destination determined.  Pt also needs DME at dc: tub bench/ cane/ BSC. CM will place order with [employer]. TOC following.   Expected Discharge Plan: OP Rehab Barriers to Discharge: Continued Medical Work up   Patient Goals and CMS Choice   CMS Medicare.gov Compare Post Acute Care list provided to:: Patient Choice offered to / list presented to : Patient      Expected Discharge Plan and Services   Discharge Planning Services: CM Consult   Living arrangements for the past 2 months: Hotel/Motel                                      Prior Living Arrangements/Services Living arrangements for the past 2 months: Hotel/Motel Lives with:: Friends Patient language and need for interpreter reviewed:: Yes Do you feel safe going back to the place where you live?: Yes        Care giver support system in place?: Yes (comment)   Criminal Activity/Legal Involvement Pertinent to Current Situation/Hospitalization: No - Comment as needed  Activities of Daily Living      Permission Sought/Granted                  Emotional Assessment Appearance:: Appears stated age Attitude/Demeanor/Rapport: Engaged   Orientation: : Oriented to Self, Oriented to Place, Oriented to  Time, Oriented to Situation   Psych Involvement: No (comment)  Admission diagnosis:  Stroke (cerebrum) (HCC)  [I63.9] Cerebrovascular accident (CVA), unspecified mechanism (HCC) [I63.9] Patient Active Problem List   Diagnosis Date Noted   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   Vomiting 06/09/2023   Stroke (cerebrum) (HCC) 06/09/2023   Cerebellar stroke (HCC) 02/03/2023   Ataxia, post-stroke    Gait disturbance, post-stroke    Intractable hiccoughs    Cerebellar infarction (HCC) 07/07/2018   Unstable angina (HCC) 01/26/2018   NSTEMI (non-ST elevated myocardial infarction) (HCC) 11/28/2017   Malignant essential hypertension 07/01/2016   Chest pain 07/01/2016   SOB (shortness of breath) 12/10/2011   CAD (coronary artery disease)    Hyperlipidemia    Hypertension    Tobacco abuse    Cocaine abuse (HCC)    PCP:  [facility] Pharmacy:   [facility] [address] [place] [address] Phone: [phone] Fax: [phone]  [facility] - [place], [place] - [address] [place] [address] Phone: [phone] Fax: [phone]  [facility] [address], [place] - [address] [place] [address] Phone: [phone] Fax: [phone]     Social Drivers of Health (SDOH) Social History: SDOH Screenings   Food Insecurity:  Food Insecurity Present (02/03/2023)  Housing: Low Risk  (02/03/2023)  Transportation Needs: No Transportation Needs (02/03/2023)  Utilities: At Risk (02/03/2023)  Tobacco Use: High Risk (04/16/2023)   SDOH Interventions:     Readmission Risk Interventions    02/04/2023   11:40 AM  Readmission Risk Prevention Plan  Post Dischage Appt Complete  Medication Screening Complete  Transportation Screening Complete

## 2023-06-10 NOTE — Progress Notes (Signed)
Renal arterial duplex completed. Please see CV Procedures for preliminary results.  Shona Simpson, RVT 06/10/23 2:51 PM

## 2023-06-10 NOTE — Progress Notes (Signed)
    Durable Medical Equipment  (From admission, onward)           Start     Ordered   06/10/23 1559  For home use only DME Tub bench  Once        06/10/23 1558   06/10/23 1558  For home use only DME Cane  Once        06/10/23 1558   06/10/23 1558  For home use only DME Bedside commode  Once       Question:  Patient needs a bedside commode to treat with the following condition  Answer:  Stroke [facility])   06/10/23 1558             The patient is confined to one level of the home environment and there is no toilet on the that level of the home.

## 2023-06-10 NOTE — Progress Notes (Signed)
PROGRESS NOTE    Kenneth TERPSTRA  ZOX:[id number] DOB: Aug 05, 1965 DOA: 06/09/2023 PCP: [facility]  Outpatient Specialists:     Brief Narrative:  Patient is a 58 year old male with past medical history significant for remote stroke and current cocaine abuse.  Patient was admitted with acute ischemic stroke (speech difficulties and gait imbalance), thrombosis of renal vein and urine toxicology that was positive for cocaine.  Patient reported smoking cocaine.  MRI of the brain done on presentation revealed punctate acute infarct of the brachium pontis on the right, linear acute infarct in the right hemi pons and new small left thalamic microhemorrhage.  Patient is currently on aspirin and Plavix.  Neurology input is highly appreciated.     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 -Long-term aspirin and Plavix reported. -Patient counseled to quit cocaine and cigarette use.  Abdominal pain/possible renal vein thrombosis: -As per CT scan abdomen and pelvis with contrast. -Low threshold for further workup. -Consider ANA. -Patient uses cocaine.  Cocaine abuse: -Counseled to quit.  Tobacco use disorder: -Counseled to quit.   DVT prophylaxis: Subcutaneous Lovenox Code Status: Full code Family Communication:  Disposition Plan: Patient remains inpatient.   Consultants:  Neurology  Procedures:  None  Antimicrobials:  None   Subjective: No new complaints.  Objective: Vitals:   06/10/23 0000 06/10/23 0359 06/10/23 0822 06/10/23 1116  BP: (!) 193/110 (!) 173/90 (!) 167/99 (!) 166/88  Pulse: 66 68 80 63  Resp: 20 18 18 18   Temp: 98.5 F (36.9 C) 98.7 F (37.1 C) 98.8 F (37.1 C) 98.4 F (36.9 C)  TempSrc: Oral Oral Oral Oral  SpO2: 99% 97% 97% 98%  Weight:      Height:        Intake/Output  Summary (Last 24 hours) at 06/10/2023 1158 Last data filed at 06/10/2023 0000 Gross per 24 hour  Intake 360 ml  Output --  Net 360 ml   Filed Weights   06/09/23 1754  Weight: 72.6 kg    Examination:  General exam: Appears calm and comfortable.  Patient is dysarthric. Respiratory system: Clear to auscultation.  Cardiovascular system: S1 & S2 heard Gastrointestinal system: Abdomen is nontender.   Central nervous system: Patient remains dysarthric.     Data Reviewed: I have personally reviewed following labs and imaging studies  CBC: Recent Labs  Lab 06/09/23 1541 06/09/23 1603  WBC 9.3  --   NEUTROABS 7.0  --   HGB 15.7 16.0  HCT 46.8 47.0  MCV 93.0  --   PLT 252  --    Basic Metabolic Panel: Recent Labs  Lab 06/09/23 1541 06/09/23 1603  NA 141 143  K 4.1 4.2  CL 105 106  CO2 26  --   GLUCOSE 90 86  BUN 10 13  CREATININE 1.25* 1.20  CALCIUM 9.2  --    GFR: Estimated Creatinine Clearance: 68.1 mL/min (by C-G formula based on SCr of 1.2 mg/dL). Liver Function Tests: Recent Labs  Lab 06/09/23 1541  AST 20  ALT 6  ALKPHOS 86  BILITOT 1.7*  PROT 7.2  ALBUMIN 3.7   No results for input(s): "LIPASE", "AMYLASE" in the last 168 hours. No results for input(s): "AMMONIA" in the last 168 hours. Coagulation Profile: Recent Labs  Lab 06/09/23 1541  INR 1.0   Cardiac Enzymes: No  results for input(s): "CKTOTAL", "CKMB", "CKMBINDEX", "TROPONINI" in the last 168 hours. BNP (last 3 results) No results for input(s): "PROBNP" in the last 8760 hours. HbA1C: Recent Labs    06/10/23 0909  HGBA1C 5.2   CBG: Recent Labs  Lab 06/09/23 1819  GLUCAP 79   Lipid Profile: Recent Labs    06/10/23 0741  CHOL 157  HDL 27*  LDLCALC 107*  TRIG 114  CHOLHDL 5.8   Thyroid Function Tests: No results for input(s): "TSH", "T4TOTAL", "FREET4", "T3FREE", "THYROIDAB" in the last 72 hours. Anemia Panel: No results for input(s): "VITAMINB12", "FOLATE", "FERRITIN",  "TIBC", "IRON", "RETICCTPCT" in the last 72 hours. Urine analysis:    Component Value Date/Time   COLORURINE AMBER (A) 06/09/2023 1555   APPEARANCEUR HAZY (A) 06/09/2023 1555   APPEARANCEUR Clear 08/16/2014 0552   LABSPEC 1.024 06/09/2023 1555   LABSPEC 1.013 08/16/2014 0552   PHURINE 5.0 06/09/2023 1555   GLUCOSEU NEGATIVE 06/09/2023 1555   GLUCOSEU Negative 08/16/2014 0552   HGBUR NEGATIVE 06/09/2023 1555   BILIRUBINUR NEGATIVE 06/09/2023 1555   BILIRUBINUR Negative 08/16/2014 0552   KETONESUR NEGATIVE 06/09/2023 1555   PROTEINUR 30 (A) 06/09/2023 1555   NITRITE NEGATIVE 06/09/2023 1555   LEUKOCYTESUR TRACE (A) 06/09/2023 1555   LEUKOCYTESUR Negative 08/16/2014 0552   Sepsis Labs: @LABRCNTIP (procalcitonin:4,lacticidven:4)  ) Recent Results (from the past 240 hours)  Resp panel by RT-PCR (RSV, Flu A&B, Covid) Anterior Nasal Swab     Status: None   Collection Time: 06/09/23  3:41 PM   Specimen: Anterior Nasal Swab  Result Value Ref Range Status   SARS Coronavirus 2 by RT PCR NEGATIVE NEGATIVE Final   Influenza A by PCR NEGATIVE NEGATIVE Final   Influenza B by PCR NEGATIVE NEGATIVE Final    Comment: (NOTE) The Xpert Xpress SARS-CoV-2/FLU/RSV plus assay is intended as an aid in the diagnosis of influenza from Nasopharyngeal swab specimens and should not be used as a sole basis for treatment. Nasal washings and aspirates are unacceptable for Xpert Xpress SARS-CoV-2/FLU/RSV testing.  Fact Sheet for Patients: BloggerCourse.com  Fact Sheet for Healthcare Providers: SeriousBroker.it  This test is not yet approved or cleared by the [place] FDA and has been authorized for detection and/or diagnosis of SARS-CoV-2 by FDA under an Emergency Use Authorization (EUA). This EUA will remain in effect (meaning this test can be used) for the duration of the COVID-19 declaration under Section 564(b)(1) of the Act, 21 U.S.C. section  360bbb-3(b)(1), unless the authorization is terminated or revoked.     Resp Syncytial Virus by PCR NEGATIVE NEGATIVE Final    Comment: (NOTE) Fact Sheet for Patients: BloggerCourse.com  Fact Sheet for Healthcare Providers: SeriousBroker.it  This test is not yet approved or cleared by the [place] FDA and has been authorized for detection and/or diagnosis of SARS-CoV-2 by FDA under an Emergency Use Authorization (EUA). This EUA will remain in effect (meaning this test can be used) for the duration of the COVID-19 declaration under Section 564(b)(1) of the Act, 21 U.S.C. section 360bbb-3(b)(1), unless the authorization is terminated or revoked.  Performed at [facility], [address], [place], [place] [address]          Radiology Studies: DG CHEST PORT 1 VIEW Result Date: 06/09/2023 CLINICAL DATA:  Stroke EXAM: PORTABLE CHEST 1 VIEW COMPARISON:  08/30/2021 FINDINGS: Two frontal views of the chest demonstrate a stable cardiac silhouette. No acute airspace disease, effusion, or pneumothorax. No acute bony abnormalities. IMPRESSION: 1. No acute intrathoracic  process. Electronically Signed   By: Sharlet Salina M.D.   On: 06/09/2023 20:30   CT ABDOMEN PELVIS W CONTRAST Result Date: 06/09/2023 CLINICAL DATA:  Acute abdominal pain with nausea and vomiting EXAM: CT ABDOMEN AND PELVIS WITH CONTRAST TECHNIQUE: Multidetector CT imaging of the abdomen and pelvis was performed using the standard protocol following bolus administration of intravenous contrast. RADIATION DOSE REDUCTION: This exam was performed according to the departmental dose-optimization program which includes automated exposure control, adjustment of the mA and/or kV according to patient size and/or use of iterative reconstruction technique. CONTRAST:  75mL OMNIPAQUE IOHEXOL 350 MG/ML SOLN COMPARISON:  11/29/2017 FINDINGS: Despite efforts by the technologist and  patient, motion artifact is present on today's exam and could not be eliminated. This reduces exam sensitivity and specificity. Lower chest: Emphysema. Hepatobiliary: Mm hypodense lesion in segment 3 of the liver on image 9 series 2, previously 4 mm on 11/29/2017, considered benign. No further imaging workup of this lesion is indicated. Similarly a 4 mm hypodense lesion in the right hepatic lobe on image 23 series 2 is present in 2019 and is considered benign. Gallbladder not observed, presumed surgically absent. No biliary dilatation. Pancreas: Unremarkable Spleen: Unremarkable Adrenals/Urinary Tract: Unremarkable Stomach/Bowel: Mild rugal fold prominence in the fundus and gastric body, probably incidental, an without discrete mass identified. Sigmoid colon diverticulosis. Anastomotic staple line at the anorectal junction. Scattered diverticula in the descending colon. No dilated small bowel. Vascular/Lymphatic: Atherosclerosis is present, including aortoiliac atherosclerotic disease. Today's exam was not performed as a CT angiogram and motion artifact limits assessment, but my sense is that there is substantial soft plaque or mural thrombus in the proximal right renal artery. Reproductive: Mild prostatomegaly. Other: No supplemental non-categorized findings. Musculoskeletal: Mild to moderate degenerative hip arthropathy bilaterally. IMPRESSION: 1. A specific cause for the patient's acute abdominal pain is not identified. 2. Colonic diverticulosis. 3. Mild prostatomegaly. 4. Mild to moderate degenerative hip arthropathy bilaterally. 5. Emphysema. 6. Aortic atherosclerosis. Today's exam was not performed as a CT angiogram and motion artifact limits assessment, but my sense is that there is substantial soft plaque or mural thrombus in the proximal right renal artery, potentially causing substantial stenosis. Electronically Signed   By: Gaylyn Rong M.D.   On: 06/09/2023 18:28   CT HEAD WO CONTRAST Result  Date: 06/09/2023 CLINICAL DATA:  Transient ischemic attack, numbness in left arm. EXAM: CT HEAD WITHOUT CONTRAST TECHNIQUE: Contiguous axial images were obtained from the base of the skull through the vertex without intravenous contrast. RADIATION DOSE REDUCTION: This exam was performed according to the departmental dose-optimization program which includes automated exposure control, adjustment of the mA and/or kV according to patient size and/or use of iterative reconstruction technique. COMPARISON:  MRI brain 06/09/2023 FINDINGS: Brain: Small focal hypodensity in the left paracentral pons on image 7 series 2, cannot exclude mild chronic ischemic microvascular white matter disease or a small chronic lacunar infarct. Additional small lacunar infarct involving the head of the right caudate nucleus and adjacent anterior limb right internal capsule on image 14 series 2. Remote right posterior watershed distribution infarct. Periventricular white matter and corona radiata hypodensities favor chronic ischemic microvascular white matter disease. This appears similar to prior. Otherwise, the brainstem, cerebellum, cerebral peduncles, thalamus, basal ganglia, basilar cisterns, and ventricular system appear within normal limits. No intracranial hemorrhage, mass lesion, or acute CVA. Vascular: Unremarkable Skull: Unremarkable Sinuses/Orbits: Unremarkable Other: No supplemental non-categorized findings. IMPRESSION: 1. No acute intracranial findings. 2. Remote right posterior watershed distribution infarct. 3.  Small lacunar infarcts involving the head of the right caudate nucleus and adjacent anterior limb right internal capsule. 4. Periventricular white matter and corona radiata hypodensities favor chronic ischemic microvascular white matter disease. 5. Small focal hypodensity in the left paracentral pons, cannot exclude mild chronic ischemic microvascular white matter disease or a small chronic lacunar infarct. Electronically  Signed   By: Gaylyn Rong M.D.   On: 06/09/2023 18:20   MR BRAIN WO CONTRAST Result Date: 06/09/2023 CLINICAL DATA:  Neuro deficit, acute, stroke suspected EXAM: MRI HEAD WITHOUT CONTRAST TECHNIQUE: Multiplanar, multiecho pulse sequences of the brain and surrounding structures were obtained without intravenous contrast. COMPARISON:  Brain MR 04/16/2023 FINDINGS: Brain: Punctate acute infarct of the brachium pontis on the right (series 2, image 13). There is also an acute infarcts in the right hemi pons (series 2, image 18). Compared to prior exam there is new small left thalamic microhemorrhage. There is a background of moderate to severe chronic microvascular ischemic change. There is chronic infarcts in the right parietal lobe and right caudate head. There is advanced generalized volume loss. Vascular: Normal flow voids. Skull and upper cervical spine: Normal marrow signal. Sinuses/Orbits: No middle ear or mastoid effusion. Paranasal sinuses are clear. Orbits are unremarkable. Other: None. IMPRESSION: 1. Punctate acute infarct of the brachium pontis on the right. 2. Linear acute infarct in the right hemi pons. 3. New small left thalamic microhemorrhage. Electronically Signed   By: Lorenza Cambridge M.D.   On: 06/09/2023 17:26        Scheduled Meds:  aspirin  81 mg Oral Daily   atorvastatin  80 mg Oral q1800   clopidogrel  75 mg Oral Daily   nicotine  14 mg Transdermal Daily   Continuous Infusions:   LOS: 1 day    Time spent: 35 minutes.    Berton Mount, MD  [facility] Pager #: [phone] 7PM-7AM contact night coverage as above

## 2023-06-11 ENCOUNTER — Other Ambulatory Visit (HOSPITAL_COMMUNITY): Payer: Self-pay

## 2023-06-11 DIAGNOSIS — I639 Cerebral infarction, unspecified: Secondary | ICD-10-CM | POA: Diagnosis not present

## 2023-06-11 LAB — ECHOCARDIOGRAM COMPLETE
AR max vel: 2.18 cm2
AV Area VTI: 2.49 cm2
AV Area mean vel: 1.99 cm2
AV Mean grad: 4 mm[Hg]
AV Peak grad: 6.9 mm[Hg]
Ao pk vel: 1.31 m/s
Area-P 1/2: 2.62 cm2
Height: 70 in
P 1/2 time: 890 ms
S' Lateral: 4.9 cm
Weight: 2560 [oz_av]

## 2023-06-11 MED ORDER — EZETIMIBE 10 MG PO TABS
10.0000 mg | ORAL_TABLET | Freq: Every day | ORAL | 1 refills | Status: DC
Start: 2023-06-12 — End: 2023-06-12
  Filled 2023-06-11: qty 90, 90d supply, fill #0

## 2023-06-11 MED ORDER — ENSURE ENLIVE PO LIQD
237.0000 mL | Freq: Two times a day (BID) | ORAL | 0 refills | Status: AC
  Filled 2023-06-11: qty 14220, 30d supply, fill #0

## 2023-06-11 MED ORDER — ATORVASTATIN CALCIUM 80 MG PO TABS
80.0000 mg | ORAL_TABLET | Freq: Every day | ORAL | 1 refills | Status: DC
  Filled 2023-06-11: qty 90, 90d supply, fill #0

## 2023-06-11 MED ORDER — LOSARTAN POTASSIUM 25 MG PO TABS
25.0000 mg | ORAL_TABLET | Freq: Every day | ORAL | 0 refills | Status: DC
  Filled 2023-06-11: qty 90, 90d supply, fill #0

## 2023-06-11 MED ORDER — CLOPIDOGREL BISULFATE 75 MG PO TABS
75.0000 mg | ORAL_TABLET | Freq: Every day | ORAL | 0 refills | Status: DC
  Filled 2023-06-11: qty 90, 90d supply, fill #0

## 2023-06-11 MED ORDER — NICOTINE 14 MG/24HR TD PT24
14.0000 mg | MEDICATED_PATCH | Freq: Every day | TRANSDERMAL | 0 refills | Status: DC
  Filled 2023-06-11: qty 28, 28d supply, fill #0

## 2023-06-11 MED ORDER — LOSARTAN POTASSIUM 50 MG PO TABS
100.0000 mg | ORAL_TABLET | Freq: Every day | ORAL | Status: DC
  Administered 2023-06-11 – 2023-06-12 (×2): 100 mg via ORAL
  Filled 2023-06-11 (×3): qty 2

## 2023-06-11 MED ORDER — ASPIRIN 81 MG PO CHEW
81.0000 mg | CHEWABLE_TABLET | Freq: Every day | ORAL | 1 refills | Status: DC
  Filled 2023-06-11: qty 90, 90d supply, fill #0

## 2023-06-11 NOTE — Progress Notes (Signed)
STROKE TEAM PROGRESS NOTE   INTERIM HISTORY/SUBJECTIVE Patient remains neurologically stable without recurrent stroke symptoms.  His brother is at the bedside.  Echocardiogram shows diminished ejection fraction of 40 to 45% with severe dilatation of left atrium.  OBJECTIVE  CBC    Component Value Date/Time   WBC 9.3 06/09/2023 1541   RBC 5.03 06/09/2023 1541   HGB 16.0 06/09/2023 1603   HGB 17.2 08/13/2014 0742   HCT 47.0 06/09/2023 1603   HCT 50.4 08/13/2014 0742   PLT 252 06/09/2023 1541   PLT 241 08/13/2014 0742   MCV 93.0 06/09/2023 1541   MCV 97 08/13/2014 0742   MCH 31.2 06/09/2023 1541   MCHC 33.5 06/09/2023 1541   RDW 13.5 06/09/2023 1541   RDW 13.4 08/13/2014 0742   LYMPHSABS 1.6 06/09/2023 1541   LYMPHSABS 2.2 10/05/2012 0108   MONOABS 0.6 06/09/2023 1541   MONOABS 0.6 10/05/2012 0108   EOSABS 0.0 06/09/2023 1541   EOSABS 0.2 10/05/2012 0108   BASOSABS 0.0 06/09/2023 1541   BASOSABS 0.1 10/05/2012 0108    BMET    Component Value Date/Time   NA 143 06/09/2023 1603   NA 140 08/13/2014 0742   K 4.2 06/09/2023 1603   K 3.9 08/13/2014 0742   CL 106 06/09/2023 1603   CL 102 08/13/2014 0742   CO2 26 06/09/2023 1541   CO2 28 08/13/2014 0742   GLUCOSE 86 06/09/2023 1603   GLUCOSE 93 08/13/2014 0742   BUN 13 06/09/2023 1603   BUN 18 08/13/2014 0742   CREATININE 1.20 06/09/2023 1603   CREATININE 1.12 08/13/2014 0742   CALCIUM 9.2 06/09/2023 1541   CALCIUM 9.7 08/13/2014 0742   GFRNONAA >60 06/09/2023 1541   GFRNONAA >60 08/13/2014 0742    IMAGING past 24 hours VAS US RENAL ARTERY DUPLEX Result Date: 06/11/2023 ABDOMINAL VISCERAL Patient Name:  Kenneth Morales [facility]  Date of Exam:   06/10/2023 Medical Rec #: [id number]        Accession #:    [id number] Date of Birth: 02/14/1966        Patient Gender: M Patient Age:   58 years Exam Location:  [facility] Procedure:      VAS US RENAL ARTERY DUPLEX Referring Phys: [id number] [facility] GOEL  -------------------------------------------------------------------------------- High Risk Factors: Hypertension, hyperlipidemia, current smoker. Limitations: Air/bowel gas. Comparison Study: None. Performing Technologist: Shona Simpson  Examination Guidelines: A complete evaluation includes B-mode imaging, spectral Doppler, color Doppler, and power Doppler as needed of all accessible portions of each vessel. Bilateral testing is considered an integral part of a complete examination. Limited examinations for reoccurring indications may be performed as noted.  Duplex Findings: +--------------------+--------+--------+------+--------+ Mesenteric          PSV cm/sEDV cm/sPlaqueComments +--------------------+--------+--------+------+--------+ Aorta Mid              59                          +--------------------+--------+--------+------+--------+ Celiac Artery Origin  114                          +--------------------+--------+--------+------+--------+ SMA Proximal          200                          +--------------------+--------+--------+------+--------+    +------------------+--------+--------+-------+ Right Renal ArteryPSV cm/sEDV cm/sComment +------------------+--------+--------+-------+ Origin  178      47           +------------------+--------+--------+-------+ Proximal            382      84           +------------------+--------+--------+-------+ Mid                 258      45           +------------------+--------+--------+-------+ Distal               46      15           +------------------+--------+--------+-------+ +-----------------+--------+--------+-------+ Left Renal ArteryPSV cm/sEDV cm/sComment +-----------------+--------+--------+-------+ Origin              62      11           +-----------------+--------+--------+-------+ Proximal            69      10           +-----------------+--------+--------+-------+ Mid                 243      45           +-----------------+--------+--------+-------+ Distal              46      13           +-----------------+--------+--------+-------+ +------------+--------+--------+----+-----------+--------+--------+----+ Right KidneyPSV cm/sEDV cm/sRI  Left KidneyPSV cm/sEDV cm/sRI   +------------+--------+--------+----+-----------+--------+--------+----+ Upper Pole  27      10      0.63Upper Pole 40      18      0.55 +------------+--------+--------+----+-----------+--------+--------+----+ Mid         17      6       0.        39      9       0.77 +------------+--------+--------+----+-----------+--------+--------+----+ Lower Pole  37      8       0.78Lower Pole 39      14      0.64 +------------+--------+--------+----+-----------+--------+--------+----+ Hilar       34      12      0.65Hilar      49      13      0.73 +------------+--------+--------+----+-----------+--------+--------+----+ +------------------+-----+------------------+-----+ Right Kidney           Left Kidney             +------------------+-----+------------------+-----+ RAR                    RAR                     +------------------+-----+------------------+-----+ RAR (manual)      6.5  RAR (manual)      4.11  +------------------+-----+------------------+-----+ Cortex                 Cortex                  +------------------+-----+------------------+-----+ Cortex thickness       Corex thickness         +------------------+-----+------------------+-----+ Kidney length (cm)10.80Kidney length (cm)10.40 +------------------+-----+------------------+-----+  Summary: Renal:  Right: Normal size right kidney. Abnormal right Resistive Index.        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 RRV  flow present. Left:  Normal size of left kidney. Abnormal left Resisitve Index.        Evidence of a > 60% stenosis in the left renal artery.  LRV        flow present. Mesenteric: Areas of limited visceral study include mesenteric arteries. Unable to visualize inferior mesenteric artery.  *See table(s) above for measurements and observations.  Diagnosing physician: Carolynn Sayers  Electronically signed by Carolynn Sayers on 06/11/2023 at 8:40:07 AM.    Final    ECHOCARDIOGRAM COMPLETE Result Date: 06/11/2023    ECHOCARDIOGRAM REPORT   Patient Name:   Kenneth Morales Date of Exam: 06/10/2023 Medical Rec #:  [id number]       Height:       70.0 in Accession #:    [id number]      Weight:       160.0 lb Date of Birth:  Sep 14, 1965       BSA:          1.898 m Patient Age:    57 years        BP:           173/90 mmHg Patient Gender: M               HR:           66 bpm. Exam Location:  Inpatient Procedure: 2D Echo, Intracardiac Opacification Agent, Cardiac Doppler and Color            Doppler (Both Spectral and Color Flow Doppler were utilized during            procedure). Indications:    Stroke  History:        Patient has prior history of Echocardiogram examinations, most                 recent 02/04/2023. CAD and Previous Myocardial Infarction; Risk                 Factors:Hypertension.  Sonographer:    Darlys Gales Referring Phys: [facility] GOEL  Sonographer Comments: Definity attempted IV infiltrated. IMPRESSIONS  1. Left ventricular ejection fraction, by estimation, is 40 to 45%. The left ventricle has mildly decreased function. The left ventricle demonstrates regional wall motion abnormalities (see scoring diagram/findings for description). The left ventricular  internal cavity size was moderately dilated. There is moderate asymmetric left ventricular hypertrophy of the septal segment. Left ventricular diastolic parameters are consistent with Grade I diastolic dysfunction (impaired relaxation).  2. Right ventricular systolic function is normal. The right ventricular size is normal.  3. Left atrial size was severely dilated.  4. The mitral valve is normal in  structure. Trivial mitral valve regurgitation. No evidence of mitral stenosis.  5. The aortic valve is tricuspid. Aortic valve regurgitation is mild. No aortic stenosis is present.  6. The inferior vena cava is normal in size with greater than 50% respiratory variability, suggesting right atrial pressure of 3 mmHg. FINDINGS  Left Ventricle: Left ventricular ejection fraction, by estimation, is 40 to 45%. The left ventricle has mildly decreased function. The left ventricle demonstrates regional wall motion abnormalities. Strain imaging was not performed. The left ventricular  internal cavity size was moderately dilated. There is moderate asymmetric left ventricular hypertrophy of the septal segment. Left ventricular diastolic parameters are consistent with Grade I diastolic dysfunction (impaired relaxation). Indeterminate filling pressures.  LV Wall Scoring: The mid and distal lateral wall, posterior wall, mid anterolateral segment, mid inferoseptal segment, apical anterior segment, and  basal inferoseptal segment are hypokinetic. The anterior wall, entire anterior septum, entire inferior wall, basal anterolateral segment, and apex are normal. Right Ventricle: The right ventricular size is normal. No increase in right ventricular wall thickness. Right ventricular systolic function is normal. Left Atrium: Left atrial size was severely dilated. Right Atrium: Right atrial size was normal in size. Pericardium: There is no evidence of pericardial effusion. Mitral Valve: The mitral valve is normal in structure. Trivial mitral valve regurgitation. No evidence of mitral valve stenosis. Tricuspid Valve: The tricuspid valve is normal in structure. Tricuspid valve regurgitation is not demonstrated. No evidence of tricuspid stenosis. Aortic Valve: The aortic valve is tricuspid. Aortic valve regurgitation is mild. Aortic regurgitation PHT measures 890 msec. No aortic stenosis is present. Aortic valve mean gradient measures 4.0  mmHg. Aortic valve peak gradient measures 6.9 mmHg. Aortic  valve area, by VTI measures 2.49 cm. Pulmonic Valve: The pulmonic valve was normal in structure. Pulmonic valve regurgitation is not visualized. No evidence of pulmonic stenosis. Aorta: The aortic root is normal in size and structure. Venous: The inferior vena cava is normal in size with greater than 50% respiratory variability, suggesting right atrial pressure of 3 mmHg. IAS/Shunts: No atrial level shunt detected by color flow Doppler. Additional Comments: 3D imaging was not performed.  LEFT VENTRICLE PLAX 2D LVIDd:         6.20 cm   Diastology LVIDs:         4.90 cm   LV e' medial:    3.05 cm/s LV PW:         1.00 cm   LV E/e' medial:  13.7 LV IVS:        1.40 cm   LV e' lateral:   3.70 cm/s LVOT diam:     1.80 cm   LV E/e' lateral: 11.3 LV SV:         60 LV SV Index:   32 LVOT Area:     2.54 cm  RIGHT VENTRICLE RV S prime:     18.50 cm/s TAPSE (M-mode): 2.1 cm LEFT ATRIUM              Index        RIGHT ATRIUM           Index LA Vol (A2C):   107.0 ml 56.36 ml/m  RA Area:     15.10 cm LA Vol (A4C):   65.6 ml  34.55 ml/m  RA Volume:   40.30 ml  21.23 ml/m LA Biplane Vol: 91.3 ml  48.09 ml/m  AORTIC VALVE AV Area (Vmax):    2.18 cm AV Area (Vmean):   1.99 cm AV Area (VTI):     2.49 cm AV Vmax:           131.00 cm/s AV Vmean:          97.100 cm/s AV VTI:            0.242 m AV Peak Grad:      6.9 mmHg AV Mean Grad:      4.0 mmHg LVOT Vmax:         112.00 cm/s LVOT Vmean:        75.800 cm/s LVOT VTI:          0.237 m LVOT/AV VTI ratio: 0.98 AI PHT:            890 msec MITRAL VALVE MV Area (PHT): 2.62 cm    SHUNTS MV Decel Time: 289 msec  Systemic VTI:  0.24 m MV E velocity: 41.90 cm/s  Systemic Diam: 1.80 cm MV A velocity: 83.30 cm/s MV E/A ratio:  0.50 Chilton Si MD Electronically signed by Chilton Si MD Signature Date/Time: 06/11/2023/8:15:37 AM    Final     Vitals:   06/10/23 2333 06/11/23 0400 06/11/23 0845 06/11/23 1200  BP:  (!) 182/111 (!) 158/106 (!) 161/93 (!) 123/102  Pulse:  73 71 80  Resp: 18 18 16 18   Temp: 99 F (37.2 C) 98.9 F (37.2 C) 98 F (36.7 C) 98.3 F (36.8 C)  TempSrc: Oral Oral Oral Oral  SpO2: 95% 97% 93% 99%  Weight:      Height:         PHYSICAL EXAM General:  Alert, well-nourished, well-developed pleasant middle-age African-American male in no acute distress Psych:  Mood and affect appropriate for situation Respiratory:  Regular, unlabored respirations on room air  NEURO:  Mental Status: AA&Ox3, patient is able to give clear and coherent history Speech/Language: speech is without dysarthria or aphasia.   Cranial Nerves:  II: PERRL. Visual fields full.  III, IV, VI: EOMI. Eyelids elevate symmetrically.  V: Sensation is intact to light touch and symmetrical to face.  VII: Mild left facial droop VIII: hearing intact to voice. IX, X: Phonation is normal.  XII: tongue is midline without fasciculations. Motor: 5/5 strength to all muscle groups tested, but diminished fine finger movements bilaterally and right arm orbiting left Tone: is normal and bulk is normal Sensation- Intact to light touch bilaterally.  Coordination: FTN intact bilaterally Gait- deferred  Most Recent NIH  1a Level of Conscious.: 0 1b LOC Questions: 0 1c LOC Commands: 0 2 Best Gaze: 0 3 Visual: 0 4 Facial Palsy: 1 5a Motor Arm - left: 0 5b Motor Arm - Right: 0 6a Motor Leg - Left: 0 6b Motor Leg - Right: 0 7 Limb Ataxia: 0 8 Sensory: 0 9 Best Language: 0 10 Dysarthria: 0 11 Extinct. and Inatten.: 0 TOTAL: 1   ASSESSMENT/PLAN  Kenneth Morales is a 58 y.o. 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  Urine toxicology was positive for cocaine.  MRI reveals 2 small infarcts in the right pons NIH on Admission 4  Acute Ischemic Infarct:  right pontine infarcts Etiology: Small vessel disease in the setting of  cocaine use  CT head No acute abnormality.  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  Severe left atrial dilatation LDL 107 HgbA1c 5.2 VTE prophylaxis -SCDs aspirin 81 mg daily and clopidogrel 75 mg daily prior to admission, now on aspirin 81 mg daily and clopidogrel 75 mg daily indefinitely Therapy recommendations:  Outpatient PT/OT/ST Disposition: Ending  Hx of Stroke/TIA Patient has a history of a stroke in December 2023, also history of right PICA territory strokes in March 2020  Hypertension Home meds: Losartan 25 mg daily Stable Blood Pressure Goal: BP less than 220/110   Hyperlipidemia Home meds: Atorvastatin 80 mg daily,  resumed in hospital LDL 107, goal < 70 Add ezetimibe 10 mg daily Continue statin at discharge  Tobacco Abuse Patient smokes 0.5 packs per day  Strongly counseled to quit smoking Nicotine replacement therapy provided  Substance Abuse Patient uses cocaine UDS positive for Cocaine Unsure if ready to quit but strongly counseled to stop using cocaine TOC consult for cessation placed  Other Stroke Risk Factors Coronary artery disease   Other Active Problems None  Hospital day # 2   Patient presented with slurred speech and some gait difficulties due to small subcortical and pontine lacunar infarcts likely from small vessel disease in the setting of cocaine abuse.  Patient counseled to quit smoking cigarettes and using cocaine and maintain aggressive risk factor modification.  Recommend aspirin and Plavix  long-term given history of symptomatic coronary artery disease and aggressive risk factor modification.  Physical Occupational Therapy consults.  Long discussion with patient and his brother and answered questions.  Stroke team will sign off.  Kindly call for questions Greater than 50% time during this 35 minute visit was spent in counseling and coordination of care about his lacunar strokes and cocaine abuse and discussion about stroke prevention and treatment and answering questions. Delia Heady, MD Medical Director [facility] Pager: [phone] 06/11/2023 12:11 PM  To contact Stroke Continuity provider, please refer to [email or web address]. After hours, contact General Neurology

## 2023-06-11 NOTE — Progress Notes (Signed)
PROGRESS NOTE    Kenneth Morales  ZOX:[id number] DOB: [birth year] DOA: 06/09/2023 PCP: [facility]  Outpatient Specialists:     Brief Narrative:  Patient is a 58 year old male with past medical history significant for remote stroke and current cocaine abuse.  Patient was admitted with acute ischemic stroke (speech difficulties and gait imbalance), thrombosis of renal vein and urine toxicology that was positive for cocaine.  Patient reported smoking cocaine.  MRI of the brain done on presentation revealed punctate acute infarct of the brachium pontis on the right, linear acute infarct in the right hemi pons and new small left thalamic microhemorrhage.  Patient is currently on aspirin and Plavix.  Neurology input is highly appreciated.    06/11/2023: Neurology input is appreciated.  Patient seen alongside patient's brother.  Also had a meeting with patient's brother and charge nurse.  Patient seems to be homeless at the moment.  Patient stable for discharge.  TOC to assist with discharge needs.  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 -Long-term aspirin and Plavix reported. -Patient counseled to quit cocaine and cigarette use. 06/11/2023: Pursue disposition.  Abdominal pain/possible renal vein thrombosis: -As per CT scan abdomen and pelvis with contrast. -Low threshold for further workup. -Consider ANA. -Patient uses cocaine.  Cocaine abuse: -Counseled to quit.  Tobacco use disorder: -Counseled to quit.  Hypertension: -Losartan 100 Mg p.o. once daily. -Blood pressure 188 mmHg noted.   DVT prophylaxis: Subcutaneous Lovenox Code Status: Full code Family Communication:  Disposition Plan: Patient remains inpatient.   Consultants:  Neurology  Procedures:  None  Antimicrobials:   None   Subjective: No new complaints.  Objective: Vitals:   06/11/23 0400 06/11/23 0845 06/11/23 1200 06/11/23 1611  BP: (!) 158/106 (!) 161/93 (!) 123/102 (!) 188/84  Pulse: 73 71 80 71  Resp: 18 16 18    Temp: 98.9 F (37.2 C) 98 F (36.7 C) 98.3 F (36.8 C) 98.2 F (36.8 C)  TempSrc: Oral Oral Oral Oral  SpO2: 97% 93% 99% 100%  Weight:      Height:       No intake or output data in the 24 hours ending 06/11/23 1833  Filed Weights   06/09/23 1754  Weight: 72.6 kg    Examination:  General exam: Appears calm and comfortable.  Patient is dysarthric. Respiratory system: Clear to auscultation.  Cardiovascular system: S1 & S2 heard Gastrointestinal system: Abdomen is nontender.   Central nervous system: Patient remains dysarthric.     Data Reviewed: I have personally reviewed following labs and imaging studies  CBC: Recent Labs  Lab 06/09/23 1541 06/09/23 1603  WBC 9.3  --   NEUTROABS 7.0  --   HGB 15.7 16.0  HCT 46.8 47.0  MCV 93.0  --   PLT 252  --    Basic Metabolic Panel: Recent Labs  Lab 06/09/23 1541 06/09/23 1603  NA 141 143  K 4.1 4.2  CL 105 106  CO2 26  --   GLUCOSE 90 86  BUN 10 13  CREATININE 1.25* 1.20  CALCIUM 9.2  --    GFR: Estimated Creatinine Clearance: 68.1 mL/min (by C-G formula based on SCr of 1.2 mg/dL). Liver Function Tests: Recent Labs  Lab 06/09/23 1541  AST 20  ALT 6  ALKPHOS 86  BILITOT 1.7*  PROT 7.2  ALBUMIN 3.7   No results for input(s): "LIPASE", "AMYLASE" in the last 168 hours. No results for input(s): "AMMONIA" in the last 168 hours. Coagulation Profile: Recent Labs  Lab 06/09/23 1541  INR 1.0   Cardiac Enzymes: No results for input(s): "CKTOTAL", "CKMB", "CKMBINDEX", "TROPONINI" in the last 168 hours. BNP (last 3 results) No results for input(s): "PROBNP" in the last 8760 hours. HbA1C: Recent Labs    06/10/23 0909  HGBA1C 5.2   CBG: Recent Labs  Lab 06/09/23 1819  GLUCAP 79   Lipid  Profile: Recent Labs    06/10/23 0741  CHOL 157  HDL 27*  LDLCALC 107*  TRIG 114  CHOLHDL 5.8   Thyroid Function Tests: No results for input(s): "TSH", "T4TOTAL", "FREET4", "T3FREE", "THYROIDAB" in the last 72 hours. Anemia Panel: No results for input(s): "VITAMINB12", "FOLATE", "FERRITIN", "TIBC", "IRON", "RETICCTPCT" in the last 72 hours. Urine analysis:    Component Value Date/Time   COLORURINE AMBER (A) 06/09/2023 1555   APPEARANCEUR HAZY (A) 06/09/2023 1555   APPEARANCEUR Clear 08/16/2014 0552   LABSPEC 1.024 06/09/2023 1555   LABSPEC 1.013 08/16/2014 0552   PHURINE 5.0 06/09/2023 1555   GLUCOSEU NEGATIVE 06/09/2023 1555   GLUCOSEU Negative 08/16/2014 0552   HGBUR NEGATIVE 06/09/2023 1555   BILIRUBINUR NEGATIVE 06/09/2023 1555   BILIRUBINUR Negative 08/16/2014 0552   KETONESUR NEGATIVE 06/09/2023 1555   PROTEINUR 30 (A) 06/09/2023 1555   NITRITE NEGATIVE 06/09/2023 1555   LEUKOCYTESUR TRACE (A) 06/09/2023 1555   LEUKOCYTESUR Negative 08/16/2014 0552   Sepsis Labs: @LABRCNTIP (procalcitonin:4,lacticidven:4)  ) Recent Results (from the past 240 hours)  Resp panel by RT-PCR (RSV, Flu A&B, Covid) Anterior Nasal Swab     Status: None   Collection Time: 06/09/23  3:41 PM   Specimen: Anterior Nasal Swab  Result Value Ref Range Status   SARS Coronavirus 2 by RT PCR NEGATIVE NEGATIVE Final   Influenza A by PCR NEGATIVE NEGATIVE Final   Influenza B by PCR NEGATIVE NEGATIVE Final    Comment: (NOTE) The Xpert Xpress SARS-CoV-2/FLU/RSV plus assay is intended as an aid in the diagnosis of influenza from Nasopharyngeal swab specimens and should not be used as a sole basis for treatment. Nasal washings and aspirates are unacceptable for Xpert Xpress SARS-CoV-2/FLU/RSV testing.  Fact Sheet for Patients: BloggerCourse.com  Fact Sheet for Healthcare Providers: SeriousBroker.it  This test is not yet approved or cleared by the  [place] FDA and has been authorized for detection and/or diagnosis of SARS-CoV-2 by FDA under an Emergency Use Authorization (EUA). This EUA will remain in effect (meaning this test can be used) for the duration of the COVID-19 declaration under Section 564(b)(1) of the Act, 21 U.S.C. section 360bbb-3(b)(1), unless the authorization is terminated or revoked.     Resp Syncytial Virus by PCR NEGATIVE NEGATIVE Final    Comment: (NOTE) Fact Sheet for Patients: BloggerCourse.com  Fact Sheet for Healthcare Providers: SeriousBroker.it  This test is not yet approved or cleared by the [place] FDA and has been authorized for detection and/or diagnosis of SARS-CoV-2 by FDA under an Emergency Use Authorization (EUA). This EUA will remain in effect (meaning this test can be used) for the duration of the COVID-19 declaration under Section 564(b)(1) of the Act, 21 U.S.C. section 360bbb-3(b)(1), unless the authorization is terminated or revoked.  Performed at [facility], [address] [place], [place] [address]          Radiology Studies: VAS US RENAL ARTERY DUPLEX Result Date:  06/11/2023 ABDOMINAL VISCERAL Patient Name:  Kenneth Morales [facility]  Date of Exam:   06/10/2023 Medical Rec #: [id number]        Accession #:    [id number] Date of Birth: 1965/12/23        Patient Gender: M Patient Age:   81 years Exam Location:  [facility] Procedure:      VAS US RENAL ARTERY DUPLEX Referring Phys: [id number] [facility] GOEL -------------------------------------------------------------------------------- High Risk Factors: Hypertension, hyperlipidemia, current smoker. Limitations: Air/bowel gas. Comparison Study: None. Performing Technologist: Shona Simpson  Examination Guidelines: A complete evaluation includes B-mode imaging, spectral Doppler, color Doppler, and power Doppler as needed of all accessible portions of each vessel. Bilateral  testing is considered an integral part of a complete examination. Limited examinations for reoccurring indications may be performed as noted.  Duplex Findings: +--------------------+--------+--------+------+--------+ Mesenteric          PSV cm/sEDV cm/sPlaqueComments +--------------------+--------+--------+------+--------+ Aorta Mid              59                          +--------------------+--------+--------+------+--------+ Celiac Artery Origin  114                          +--------------------+--------+--------+------+--------+ SMA Proximal          200                          +--------------------+--------+--------+------+--------+    +------------------+--------+--------+-------+ Right Renal ArteryPSV cm/sEDV cm/sComment +------------------+--------+--------+-------+ Origin              178      47           +------------------+--------+--------+-------+ Proximal            382      84           +------------------+--------+--------+-------+ Mid                 258      45           +------------------+--------+--------+-------+ Distal               46      15           +------------------+--------+--------+-------+ +-----------------+--------+--------+-------+ Left Renal ArteryPSV cm/sEDV cm/sComment +-----------------+--------+--------+-------+ Origin              62      11           +-----------------+--------+--------+-------+ Proximal            69      10           +-----------------+--------+--------+-------+ Mid                243      45           +-----------------+--------+--------+-------+ Distal              46      13           +-----------------+--------+--------+-------+ +------------+--------+--------+----+-----------+--------+--------+----+ Right KidneyPSV cm/sEDV cm/sRI  Left KidneyPSV cm/sEDV cm/sRI   +------------+--------+--------+----+-----------+--------+--------+----+ Upper Pole  27      10       0.63Upper Pole 40      18      0.55 +------------+--------+--------+----+-----------+--------+--------+----+ Mid         17  6       0.        39      9       0.77 +------------+--------+--------+----+-----------+--------+--------+----+ Lower Pole  37      8       0.78Lower Pole 39      14      0.64 +------------+--------+--------+----+-----------+--------+--------+----+ Hilar       34      12      0.65Hilar      49      13      0.73 +------------+--------+--------+----+-----------+--------+--------+----+ +------------------+-----+------------------+-----+ Right Kidney           Left Kidney             +------------------+-----+------------------+-----+ RAR                    RAR                     +------------------+-----+------------------+-----+ RAR (manual)      6.5  RAR (manual)      4.11  +------------------+-----+------------------+-----+ Cortex                 Cortex                  +------------------+-----+------------------+-----+ Cortex thickness       Corex thickness         +------------------+-----+------------------+-----+ Kidney length (cm)10.80Kidney length (cm)10.40 +------------------+-----+------------------+-----+  Summary: Renal:  Right: Normal size right kidney. Abnormal right Resistive Index.        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 RRV        flow present. Left:  Normal size of left kidney. Abnormal left Resisitve Index.        Evidence of a > 60% stenosis in the left renal artery. LRV        flow present. Mesenteric: Areas of limited visceral study include mesenteric arteries. Unable to visualize inferior mesenteric artery.  *See table(s) above for measurements and observations.  Diagnosing physician: Carolynn Sayers  Electronically signed by Carolynn Sayers on 06/11/2023 at 8:40:07 AM.    Final    ECHOCARDIOGRAM COMPLETE Result Date: 06/11/2023    ECHOCARDIOGRAM REPORT   Patient Name:   Kenneth Morales Date of Exam: 06/10/2023 Medical Rec #:  [id number]       Height:       70.0 in Accession #:    [id number]      Weight:       160.0 lb Date of Birth:  [birth year]       BSA:          1.898 m Patient Age:    57 years        BP:           173/90 mmHg Patient Gender: M               HR:           66 bpm. Exam Location:  Inpatient Procedure: 2D Echo, Intracardiac Opacification Agent, Cardiac Doppler and Color            Doppler (Both Spectral and Color Flow Doppler were utilized during            procedure). Indications:    Stroke  History:        Patient has prior history of Echocardiogram examinations, most  recent 02/04/2023. CAD and Previous Myocardial Infarction; Risk                 Factors:Hypertension.  Sonographer:    Darlys Gales Referring Phys: [facility] GOEL  Sonographer Comments: Definity attempted IV infiltrated. IMPRESSIONS  1. Left ventricular ejection fraction, by estimation, is 40 to 45%. The left ventricle has mildly decreased function. The left ventricle demonstrates regional wall motion abnormalities (see scoring diagram/findings for description). The left ventricular  internal cavity size was moderately dilated. There is moderate asymmetric left ventricular hypertrophy of the septal segment. Left ventricular diastolic parameters are consistent with Grade I diastolic dysfunction (impaired relaxation).  2. Right ventricular systolic function is normal. The right ventricular size is normal.  3. Left atrial size was severely dilated.  4. The mitral valve is normal in structure. Trivial mitral valve regurgitation. No evidence of mitral stenosis.  5. The aortic valve is tricuspid. Aortic valve regurgitation is mild. No aortic stenosis is present.  6. The inferior vena cava is normal in size with greater than 50% respiratory variability, suggesting right atrial pressure of 3 mmHg. FINDINGS  Left Ventricle: Left ventricular ejection fraction, by estimation, is 40 to 45%. The left ventricle  has mildly decreased function. The left ventricle demonstrates regional wall motion abnormalities. Strain imaging was not performed. The left ventricular  internal cavity size was moderately dilated. There is moderate asymmetric left ventricular hypertrophy of the septal segment. Left ventricular diastolic parameters are consistent with Grade I diastolic dysfunction (impaired relaxation). Indeterminate filling pressures.  LV Wall Scoring: The mid and distal lateral wall, posterior wall, mid anterolateral segment, mid inferoseptal segment, apical anterior segment, and basal inferoseptal segment are hypokinetic. The anterior wall, entire anterior septum, entire inferior wall, basal anterolateral segment, and apex are normal. Right Ventricle: The right ventricular size is normal. No increase in right ventricular wall thickness. Right ventricular systolic function is normal. Left Atrium: Left atrial size was severely dilated. Right Atrium: Right atrial size was normal in size. Pericardium: There is no evidence of pericardial effusion. Mitral Valve: The mitral valve is normal in structure. Trivial mitral valve regurgitation. No evidence of mitral valve stenosis. Tricuspid Valve: The tricuspid valve is normal in structure. Tricuspid valve regurgitation is not demonstrated. No evidence of tricuspid stenosis. Aortic Valve: The aortic valve is tricuspid. Aortic valve regurgitation is mild. Aortic regurgitation PHT measures 890 msec. No aortic stenosis is present. Aortic valve mean gradient measures 4.0 mmHg. Aortic valve peak gradient measures 6.9 mmHg. Aortic  valve area, by VTI measures 2.49 cm. Pulmonic Valve: The pulmonic valve was normal in structure. Pulmonic valve regurgitation is not visualized. No evidence of pulmonic stenosis. Aorta: The aortic root is normal in size and structure. Venous: The inferior vena cava is normal in size with greater than 50% respiratory variability, suggesting right atrial pressure of 3  mmHg. IAS/Shunts: No atrial level shunt detected by color flow Doppler. Additional Comments: 3D imaging was not performed.  LEFT VENTRICLE PLAX 2D LVIDd:         6.20 cm   Diastology LVIDs:         4.90 cm   LV e' medial:    3.05 cm/s LV PW:         1.00 cm   LV E/e' medial:  13.7 LV IVS:        1.40 cm   LV e' lateral:   3.70 cm/s LVOT diam:     1.80 cm   LV E/e' lateral: 11.3 LV  SV:         60 LV SV Index:   32 LVOT Area:     2.54 cm  RIGHT VENTRICLE RV S prime:     18.50 cm/s TAPSE (M-mode): 2.1 cm LEFT ATRIUM              Index        RIGHT ATRIUM           Index LA Vol (A2C):   107.0 ml 56.36 ml/m  RA Area:     15.10 cm LA Vol (A4C):   65.6 ml  34.55 ml/m  RA Volume:   40.30 ml  21.23 ml/m LA Biplane Vol: 91.3 ml  48.09 ml/m  AORTIC VALVE AV Area (Vmax):    2.18 cm AV Area (Vmean):   1.99 cm AV Area (VTI):     2.49 cm AV Vmax:           131.00 cm/s AV Vmean:          97.100 cm/s AV VTI:            0.242 m AV Peak Grad:      6.9 mmHg AV Mean Grad:      4.0 mmHg LVOT Vmax:         112.00 cm/s LVOT Vmean:        75.800 cm/s LVOT VTI:          0.237 m LVOT/AV VTI ratio: 0.98 AI PHT:            890 msec MITRAL VALVE MV Area (PHT): 2.62 cm    SHUNTS MV Decel Time: 289 msec    Systemic VTI:  0.24 m MV E velocity: 41.90 cm/s  Systemic Diam: 1.80 cm MV A velocity: 83.30 cm/s MV E/A ratio:  0.50 Chilton Si MD Electronically signed by Chilton Si MD Signature Date/Time: 06/11/2023/8:15:37 AM    Final    DG CHEST PORT 1 VIEW Result Date: 06/09/2023 CLINICAL DATA:  Stroke EXAM: PORTABLE CHEST 1 VIEW COMPARISON:  08/30/2021 FINDINGS: Two frontal views of the chest demonstrate a stable cardiac silhouette. No acute airspace disease, effusion, or pneumothorax. No acute bony abnormalities. IMPRESSION: 1. No acute intrathoracic process. Electronically Signed   By: Sharlet Salina M.D.   On: 06/09/2023 20:30        Scheduled Meds:  aspirin  81 mg Oral Daily   atorvastatin  80 mg Oral q1800    clopidogrel  75 mg Oral Daily   enoxaparin (LOVENOX) injection  40 mg Subcutaneous QPM   ezetimibe  10 mg Oral Daily   losartan  100 mg Oral Daily   nicotine  14 mg Transdermal Daily   Continuous Infusions:   LOS: 2 days    Time spent: 35 minutes.    Berton Mount, MD  [facility] Pager #: [phone] 7PM-7AM contact night coverage as above

## 2023-06-11 NOTE — Plan of Care (Signed)
  Problem: Education: Goal: Knowledge of disease or condition will improve Outcome: Progressing Goal: Knowledge of secondary prevention will improve (MUST DOCUMENT ALL) Outcome: Progressing Goal: Knowledge of patient specific risk factors will improve (DELETE if not current risk factor) Outcome: Progressing   Problem: Ischemic Stroke/TIA Tissue Perfusion: Goal: Complications of ischemic stroke/TIA will be minimized Outcome: Progressing   Problem: Coping: Goal: Will verbalize positive feelings about self Outcome: Progressing

## 2023-06-12 DIAGNOSIS — I639 Cerebral infarction, unspecified: Secondary | ICD-10-CM | POA: Diagnosis not present

## 2023-06-12 MED ORDER — ORAL CARE MOUTH RINSE: 15.0000 mL | OROMUCOSAL | Status: DC | PRN

## 2023-06-12 MED ORDER — LOSARTAN POTASSIUM 100 MG PO TABS: 100.0000 mg | ORAL_TABLET | Freq: Every day | ORAL | 1 refills | Status: DC

## 2023-06-12 MED ORDER — EZETIMIBE 10 MG PO TABS: 10.0000 mg | ORAL_TABLET | Freq: Every day | ORAL | 1 refills | Status: DC

## 2023-06-12 MED ORDER — AMLODIPINE BESYLATE 5 MG PO TABS: 5.0000 mg | ORAL_TABLET | Freq: Every day | ORAL | 1 refills | Status: DC

## 2023-06-12 MED ORDER — AMLODIPINE BESYLATE 5 MG PO TABS
5.0000 mg | ORAL_TABLET | Freq: Every day | ORAL | Status: DC
  Administered 2023-06-12: 5 mg via ORAL
  Filled 2023-06-12: qty 1

## 2023-06-12 NOTE — Discharge Summary (Signed)
Physician Discharge Summary  Patient ID: Kenneth Morales MRN: [id number] DOB/AGE: 60-Jul-1967 60 y.o.  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  Patient was admitted with acute ischemic stroke (speech difficulties and gait imbalance), thrombosis of renal vein and urine toxicology that was positive for cocaine.  Patient reported smoking cocaine.  MRI of the brain done on presentation revealed punctate acute infarct of the brachium pontis on the right, linear acute infarct in the right hemi pons and new small left thalamic microhemorrhage.  Neurology team directed patient's management.  Patient will be discharged on aspirin and Plavix indefinitely.  Transition of care team arranged for a safe place to discharge patient.  Input from the transition of care team is highly appreciated.  Patient will follow-up with primary care provider, cardiology and neurology team on discharge.    Acute ischemic stroke: -Neurology team directed care.  -Patient will continue aspirin and Plavix on discharge.   -Patient counseled to quit cocaine and cigarette use. -Follow-up with primary care provider, neurology and cardiology team on discharge.   Abdominal pain/possible renal vein thrombosis: -As per CT scan abdomen and pelvis with contrast. -Low threshold for further workup. -Consider ANA. -Patient uses cocaine. -Primary care provider may consider referring patient to vascular surgery and nephrology team on discharge.   Cocaine abuse: -Counseled to quit.   Tobacco use disorder: -Counseled to quit.   Hypertension: -Losartan 100 Mg p.o. once daily. -Start amlodipine 5  Mg p.o. once daily.   Consults: neurology  Significant Diagnostic Studies:  MRI HEAD WITHOUT CONTRAST:   TECHNIQUE: Multiplanar, multiecho pulse sequences of the brain and surrounding structures were obtained without intravenous contrast.   COMPARISON:  Brain MR 04/16/2023   FINDINGS: Brain: Punctate acute infarct of the brachium pontis on the right (series 2, image 13). There is also an acute infarcts in the right hemi pons (series 2, image 18). Compared to prior exam there is new small left thalamic microhemorrhage. There is a background of moderate to severe chronic microvascular ischemic change. There is chronic infarcts in the right parietal lobe and right caudate head. There is advanced generalized volume loss.   Vascular: Normal flow voids.   Skull and upper cervical spine: Normal marrow signal.   Sinuses/Orbits: No middle ear or mastoid effusion. Paranasal sinuses are clear. Orbits are unremarkable.   Other: None.   IMPRESSION: 1. Punctate acute infarct of the brachium pontis on the right. 2. Linear acute infarct in the right hemi pons. 3. New small left thalamic microhemorrhage.     Electronically Signed   By: Lorenza Cambridge M.D.   On: 06/09/2023 17:26     CT head: CT HEAD WITHOUT CONTRAST   TECHNIQUE: Contiguous axial images were obtained from the base of the skull through the vertex without intravenous contrast.   RADIATION DOSE REDUCTION: This exam was performed according to the departmental dose-optimization program which includes automated exposure control, adjustment of the mA and/or kV according to patient size and/or use of iterative reconstruction technique.   COMPARISON:  MRI brain 06/09/2023   FINDINGS: Brain: Small focal hypodensity in the left paracentral pons on image 7 series 2, cannot exclude mild chronic ischemic microvascular white  matter disease or a small chronic lacunar infarct. Additional small lacunar infarct involving the  head of the right caudate nucleus and adjacent anterior limb right internal capsule on image 14 series 2.   Remote right posterior watershed distribution infarct. Periventricular white matter and corona radiata hypodensities favor chronic ischemic microvascular white matter disease. This appears similar to prior.   Otherwise, the brainstem, cerebellum, cerebral peduncles, thalamus, basal ganglia, basilar cisterns, and ventricular system appear within normal limits. No intracranial hemorrhage, mass lesion, or acute CVA.   Vascular: Unremarkable   Skull: Unremarkable   Sinuses/Orbits: Unremarkable   Other: No supplemental non-categorized findings.   IMPRESSION: 1. No acute intracranial findings. 2. Remote right posterior watershed distribution infarct. 3. Small lacunar infarcts involving the head of the right caudate nucleus and adjacent anterior limb right internal capsule. 4. Periventricular white matter and corona radiata hypodensities favor chronic ischemic microvascular white matter disease. 5. Small focal hypodensity in the left paracentral pons, cannot exclude mild chronic ischemic microvascular white matter disease or a small chronic lacunar infarct.     Electronically Signed   By: Gaylyn Rong M.D.   On: 06/09/2023 18:20      Echo revealed: 1. Left ventricular ejection fraction, by estimation, is 40 to 45%. The  left ventricle has mildly decreased function. The left ventricle  demonstrates regional wall motion abnormalities (see scoring  diagram/findings for description). The left ventricular   internal cavity size was moderately dilated. There is moderate asymmetric  left ventricular hypertrophy of the septal segment. Left ventricular  diastolic parameters are consistent with Grade I diastolic dysfunction  (impaired relaxation).   2. Right ventricular systolic function is normal. The right ventricular  size is normal.   3. Left atrial size was severely  dilated.   4. The mitral valve is normal in structure. Trivial mitral valve  regurgitation. No evidence of mitral stenosis.   5. The aortic valve is tricuspid. Aortic valve regurgitation is mild. No  aortic stenosis is present.   6. The inferior vena cava is normal in size with greater than 50%  respiratory variability, suggesting right atrial pressure of 3 mmHg.     Lipid profile revealed LDL of 107. A1c of 5.2%  Treatments: Lifelong aspirin and Plavix.  Discharge Exam: Blood pressure (!) 159/106, pulse 66, temperature 98.3 F (36.8 C), temperature source Oral, resp. rate 18, height 5\' 10"  (1.778 m), weight 72.6 kg, SpO2 95%.   Disposition: Discharge disposition: 01-Home or Self Care       Discharge Instructions     Diet - low sodium heart healthy   Complete by: As directed    Increase activity slowly   Complete by: As directed       Allergies as of 06/12/2023       Reactions   Brilinta [ticagrelor] Shortness Of Breath   Penicillins Rash   Has patient had a PCN reaction causing immediate rash, facial/tongue/throat swelling, SOB or lightheadedness with hypotension: Yes Has patient had a PCN reaction causing severe rash involving mucus membranes or skin necrosis: No Has patient had a PCN reaction that required hospitalization: No Has patient had a PCN reaction occurring within the last 10 years: No If all of the above answers are "NO", then may proceed with Cephalosporin use.        Medication List     STOP taking these medications    aspirin 81 MG tablet Replaced by: Aspirin Low Dose 81 MG chewable tablet   metoprolol succinate 25 MG  24 hr tablet Commonly known as: TOPROL-XL   nitroGLYCERIN 0.4 MG SL tablet Commonly known as: NITROSTAT       TAKE these medications    acetaminophen 325 MG tablet Commonly known as: TYLENOL Take 2 tablets (650 mg total) by mouth every 4 (four) hours as needed for mild pain (or temp > 37.5 C (99.5 F)).   amLODipine  5 MG tablet Commonly known as: NORVASC Take 1 tablet (5 mg total) by mouth daily.   Aspirin Low Dose 81 MG chewable tablet Generic drug: aspirin Chew 1 tablet (81 mg total) by mouth daily. Replaces: aspirin 81 MG tablet   atorvastatin 80 MG tablet Commonly known as: LIPITOR Take 1 tablet (80 mg total) by mouth daily at 6 PM.   clopidogrel 75 MG tablet Commonly known as: PLAVIX Take 1 tablet (75 mg total) by mouth daily.   ezetimibe 10 MG tablet Commonly known as: ZETIA Take 1 tablet (10 mg total) by mouth daily.   feeding supplement Liqd Take 237 mLs by mouth 2 (two) times daily between meals.   losartan 100 MG tablet Commonly known as: COZAAR Take 1 tablet (100 mg total) by mouth daily. Start taking on: June 13, 2023 What changed:  medication strength how much to take when to take this   nicotine 14 mg/24hr patch Commonly known as: NICODERM CQ - dosed in mg/24 hours Place 1 patch (14 mg total) onto the skin daily. What changed:  how much to take how to take this when to take this additional instructions               Durable Medical Equipment  (From admission, onward)           Start     Ordered   06/10/23 1559  For home use only DME Tub bench  Once        06/10/23 1558   06/10/23 1558  For home use only DME Cane  Once        06/10/23 1558   06/10/23 1558  For home use only DME Bedside commode  Once       Question:  Patient needs a bedside commode to treat with the following condition  Answer:  Stroke (HCC)   06/10/23 1558           Time spent: 35 minutes.  SignedBarnetta Chapel 06/12/2023, 7:30 PM

## 2023-06-12 NOTE — TOC Transition Note (Addendum)
Transition of Care [facility]) - Discharge Note   Patient Details  Name: Kenneth Morales MRN: [id number] Date of Birth: 1965/12/27  Transition of Care [facility]) CM/SW Contact:  Deatra Robinson, [place] Phone Number: 06/12/2023, 12:55 PM   Clinical Narrative: Per MD, pt medically stable for dc today. Pt and brother at bedside requesting homeless shelter resources as they are not able to stay with pt's mother. Pt familiar with shelter and that a bed is not guaranteed. Pt's brother states he has some money for a hotel if absolutely necessary. Provided pt with community resources and taxi voucher. Also added SUD resources to AVS as pt admits to regular crack/cocaine use. CM has made arrangement for [employer] to deliver a cane. No barriers to dc from TOC. SW signing off at dc.   Dellie Burns, MSW, LCSW [phone] (coverage)      Final next level of care: Homeless Shelter Barriers to Discharge: Barriers Resolved   Patient Goals and CMS Choice   CMS Medicare.gov Compare Post Acute Care list provided to:: Patient Choice offered to / list presented to : Patient      Discharge Placement                       Discharge Plan and Services Additional resources added to the After Visit Summary for     Discharge Planning Services: CM Consult                                 Social Drivers of Health (SDOH) Interventions SDOH Screenings   Food Insecurity: Food Insecurity Present (06/11/2023)  Housing: High Risk (06/11/2023)  Transportation Needs: No Transportation Needs (06/11/2023)  Utilities: At Risk (06/11/2023)  Tobacco Use: High Risk (04/16/2023)     Readmission Risk Interventions    02/04/2023   11:40 AM  Readmission Risk Prevention Plan  Post Dischage Appt Complete  Medication Screening Complete  Transportation Screening Complete

## 2023-06-12 NOTE — Progress Notes (Signed)
Patient ready for discharge from hospital. Discharge instructions given and reviewed. Medications given from [facility] pharmacy; instructed that two new Rx's written today have been sent to [facility] pharmacy in the community for pick up. Discharge instructions given and reviewed. Brother present at bedside; patient discharged out via wheelchair to taxi transportation from the hospital.

## 2023-06-13 ENCOUNTER — Other Ambulatory Visit (HOSPITAL_COMMUNITY): Payer: Self-pay

## 2023-06-16 ENCOUNTER — Telehealth: Payer: Medicaid Other | Admitting: Emergency Medicine

## 2023-06-16 ENCOUNTER — Other Ambulatory Visit (HOSPITAL_COMMUNITY): Payer: Self-pay

## 2023-06-16 ENCOUNTER — Telehealth: Payer: Medicaid Other | Admitting: Nurse Practitioner

## 2023-06-16 DIAGNOSIS — Z5900 Homelessness unspecified: Secondary | ICD-10-CM

## 2023-06-16 DIAGNOSIS — R531 Weakness: Secondary | ICD-10-CM

## 2023-06-16 DIAGNOSIS — I639 Cerebral infarction, unspecified: Secondary | ICD-10-CM | POA: Diagnosis not present

## 2023-06-16 DIAGNOSIS — I823 Embolism and thrombosis of renal vein: Secondary | ICD-10-CM

## 2023-06-16 DIAGNOSIS — R449 Unspecified symptoms and signs involving general sensations and perceptions: Secondary | ICD-10-CM | POA: Diagnosis not present

## 2023-06-16 NOTE — Progress Notes (Deleted)
 Virtual Visit Consent   WACO FOERSTER, you are scheduled for a virtual visit with a [facility] provider today. Just as with appointments in the office, your consent must be obtained to participate. Your consent will be active for this visit and any virtual visit you may have with one of our providers in the next 365 days. If you have a MyChart account, a copy of this consent can be sent to you electronically.  As this is a virtual visit, video technology does not allow for your provider to perform a traditional examination. This may limit your provider's ability to fully assess your condition. If your provider identifies any concerns that need to be evaluated in person or the need to arrange testing (such as labs, EKG, etc.), we will make arrangements to do so. Although advances in technology are sophisticated, we cannot ensure that it will always work on either your end or our end. If the connection with a video visit is poor, the visit may have to be switched to a telephone visit. With either a video or telephone visit, we are not always able to ensure that we have a secure connection.  By engaging in this virtual visit, you consent to the provision of healthcare and authorize for your insurance to be billed (if applicable) for the services provided during this visit. Depending on your insurance coverage, you may receive a charge related to this service.  I need to obtain your verbal consent now. Are you willing to proceed with your visit today? Kenneth Morales has provided verbal consent on 06/16/2023 for a virtual visit (video or telephone). [name], FNP  Date: 06/16/2023 10:28 AM   Virtual Visit via Video Note   I, [name], connected with  Kenneth Morales  ([id number], [birth year]) on 06/16/23 at 10:40 AM EST by a video-enabled telemedicine application and verified that I am speaking with the correct person using two identifiers.  Location: Patient: Virtual Visit Location Patient:  Home Provider: Virtual Visit Location Provider: Home Office   I discussed the limitations of evaluation and management by telemedicine and the availability of in person appointments. The patient expressed understanding and agreed to proceed.    History of Present Illness: Kenneth Morales is a 58 y.o. who identifies as a male who was assigned male at birth, and is being seen today for TOC from recent hospitalization and establishment with PCP .  The patient was hospitalized from 06/09/23- 06/12/23 with an acute ischemic stroke and embolism of renal vein. He has a history of Cerebellar stroke in 2024.   He was discharged with left sided weakness, dysarthria, and sensory deficits (left sided numbness). Using a cane for mobility    Notes from Case Manger stated that plans for discharge were for OP Rehab  Patient was homeless at the time of admission, was discharged without rehabilitation plan or shelter.   Is not staying in a Motel provided by resources from the [facility].  The patient was intercepted at the [facility] yesterday by Congregational nurse who identified that the patient needed higher level of services that he is unable to coordinate in an outpatient manner at this time.  He does not have home support   Visit today is to help establish care with a Primary Care Provider, to assess the  need for inpatient rehabilitation.      Problems:  Patient Active Problem List   Diagnosis Date Noted   Embolism and thrombosis of renal vein (  HCC) 06/09/2023   Vomiting 06/09/2023   Acute ischemic stroke (HCC) 06/09/2023   Cerebellar stroke (HCC) 02/03/2023   Ataxia, post-stroke    Gait disturbance, post-stroke    Intractable hiccoughs    Cerebellar infarction (HCC) 07/07/2018   Unstable angina (HCC) 01/26/2018   NSTEMI (non-ST elevated myocardial infarction) (HCC) 11/28/2017   Malignant essential hypertension 07/01/2016   Chest pain 07/01/2016   SOB (shortness of  breath) 12/10/2011   CAD (coronary artery disease)    Hyperlipidemia    Hypertension    Tobacco abuse    Cocaine abuse (HCC)     Allergies:  Allergies  Allergen Reactions   Brilinta [Ticagrelor] Shortness Of Breath   Penicillins Rash    Has patient had a PCN reaction causing immediate rash, facial/tongue/throat swelling, SOB or lightheadedness with hypotension: Yes Has patient had a PCN reaction causing severe rash involving mucus membranes or skin necrosis: No Has patient had a PCN reaction that required hospitalization: No Has patient had a PCN reaction occurring within the last 10 years: No If all of the above answers are "NO", then may proceed with Cephalosporin use.   Medications:  Current Outpatient Medications:    acetaminophen (TYLENOL) 325 MG tablet, Take 2 tablets (650 mg total) by mouth every 4 (four) hours as needed for mild pain (or temp > 37.5 C (99.5 F))., Disp: , Rfl:    amLODipine (NORVASC) 5 MG tablet, Take 1 tablet (5 mg total) by mouth daily., Disp: 30 tablet, Rfl: 1   aspirin 81 MG chewable tablet, Chew 1 tablet (81 mg total) by mouth daily., Disp: 90 tablet, Rfl: 1   atorvastatin (LIPITOR) 80 MG tablet, Take 1 tablet (80 mg total) by mouth daily at 6 PM., Disp: 90 tablet, Rfl: 1   clopidogrel (PLAVIX) 75 MG tablet, Take 1 tablet (75 mg total) by mouth daily., Disp: 90 tablet, Rfl: 0   ezetimibe (ZETIA) 10 MG tablet, Take 1 tablet (10 mg total) by mouth daily., Disp: 90 tablet, Rfl: 1   feeding supplement (ENSURE ENLIVE / ENSURE PLUS) LIQD, Take 237 mLs by mouth 2 (two) times daily between meals., Disp: 14220 mL, Rfl: 0   losartan (COZAAR) 100 MG tablet, Take 1 tablet (100 mg total) by mouth daily., Disp: 30 tablet, Rfl: 1   nicotine (NICODERM CQ - DOSED IN MG/24 HOURS) 14 mg/24hr patch, Place 1 patch (14 mg total) onto the skin daily., Disp: 90 patch, Rfl: 0  Observations/Objective: Patient is well-developed, well-nourished in no acute distress.  Resting  comfortably  at hotel.  Head is normocephalic, atraumatic.  No labored breathing.  Speech is clear and coherent with logical content.  Patient is alert and oriented at baseline.    Assessment and Plan:    Follow Up Instructions: I discussed the assessment and treatment plan with the patient. The patient was provided an opportunity to ask questions and all were answered. The patient agreed with the plan and demonstrated an understanding of the instructions.  A copy of instructions were sent to the patient via MyChart unless otherwise noted below.    The patient was advised to call back or seek an in-person evaluation if the symptoms worsen or if the condition fails to improve as anticipated.    [name], FNP

## 2023-06-16 NOTE — Congregational Nurse Program (Signed)
06/15/23 1000: - Client and Brother Luisa Hart) to RN office. Mr. Jorja Loa recently released from hospital from CV[facility], he is having difficulty with gait, speech, dressing and bathing himself. He also is newly homeless for one week. Per chart review he needs several DME's to function after discharge. It is snowing outside and client had extreme difficulty walking up hill to [facility], and walking on rock [place] to get to office. CVA with left sided weakness noted. He is at a very high risk of fall, with injury, if left outside. They have also run out of money for food and had enough funding for one nights stay after discharge. RN was able to get client approved for temporary hotel housing through the [facility] while assisting in next steps.  Client, patrick and RN are in agreement to get client into a facility based on his need for physical therapy, sleep therapy and his need for assistance with ADL's. RN setting up an appointment to get client seen by a PCP to be able to complete a FL2 for an ALF.   RN arranged [employer], used funding to get food and water to last for one week. Client chart reviewed for food and he has no difficulty with swallowing, no dietary restrictions and no allergies.   He is need of a pill divider to help with medication which RN will supply on Monday.  06/16/23 0900:  RN arranged and coordinated care to assist client to get an earlier visit to be seen by provider to get an FL2 completed. RN traveled to client's hotel room to set up virtual appointment and assist with advocating for client's needs while on call. RN they followed up with [facility] pharmacy- client stated he wasn't sure if he had all medicine. RN ensured client has all of his medicine.   RN then called [facility] who states they have a male bed available for client and can hold it for him. Contingent client completes application for social security and or disability. They will be able to accept him as long as they have proof of  application completion. RN attempted to assist clients with application online and was unsuccessful and they were instructed to call over the phone and get application done today so Monday we can get him in placement.   Client is also scheduled a follow up an appointment with PCP and RN will arrange transportation for 12 pm appointment. He will then come to RN office for meeting, for continued case management to help him get an ID. Can not complete disability paperwork at this time due to lack of identification.  RN also offered spiritual care for client and brother this has been an overwhelming and challenging time in there lives. This is new to them and RN encouraged and reminded them about surrendering to God and trusting that He is more than able to help them and is with them in their pain and struggles. That this time is temporary and they will overcome. RN listened empathetically and provided therapeutic communication.

## 2023-06-16 NOTE — Progress Notes (Signed)
Virtual Visit Consent   Kenneth Morales, you are scheduled for a virtual visit with a [facility] provider today. Just as with appointments in the office, your consent must be obtained to participate. Your consent will be active for this visit and any virtual visit you may have with one of our providers in the next 365 days. If you have a MyChart account, a copy of this consent can be sent to you electronically.  As this is a virtual visit, video technology does not allow for your provider to perform a traditional examination. This may limit your provider's ability to fully assess your condition. If your provider identifies any concerns that need to be evaluated in person or the need to arrange testing (such as labs, EKG, etc.), we will make arrangements to do so. Although advances in technology are sophisticated, we cannot ensure that it will always work on either your end or our end. If the connection with a video visit is poor, the visit may have to be switched to a telephone visit. With either a video or telephone visit, we are not always able to ensure that we have a secure connection.  By engaging in this virtual visit, you consent to the provision of healthcare and authorize for your insurance to be billed (if applicable) for the services provided during this visit. Depending on your insurance coverage, you may receive a charge related to this service.  I need to obtain your verbal consent now. Are you willing to proceed with your visit today? Kenneth Morales has provided verbal consent on 06/16/2023 for a virtual visit (video or telephone). [name], FNP  Date: 06/16/2023 11:23 AM   Virtual Visit via Video Note   I, [name], connected with  Kenneth Morales  ([id number], Jun 02, 1965) on 06/16/23 at 12:20 PM EST by a video-enabled telemedicine application and verified that I am speaking with the correct person using two identifiers.  Location: Patient: Virtual Visit Location Patient:  Home Provider: Virtual Visit Location Provider: Home Office   I discussed the limitations of evaluation and management by telemedicine and the availability of in person appointments. The patient expressed understanding and agreed to proceed.    History of Present Illness:  Kenneth Morales is a 58 y.o. who identifies as a male who was assigned male at birth, and is being seen today for TOC from recent hospitalization and establishment with PCP .   The patient was hospitalized from 06/09/23- 06/12/23 with an acute ischemic stroke and embolism of renal vein. He has a history of Cerebellar stroke in 2024.    He was discharged with left sided weakness, dysarthria, and sensory deficits (left sided numbness). Using a cane for mobility     Notes from Case Manger stated that plans for discharge were for OP Rehab  Patient was homeless at the time of admission, was discharged without rehabilitation plan or shelter.    Is not staying in a Motel provided by resources from the [facility].  The patient was intercepted at the [facility] yesterday by Congregational nurse who identified that the patient needed higher level of services that he is unable to coordinate in an outpatient manner at this time.  He does not have home support    Visit today is to help establish care with a Primary Care Provider, to assess the  need for inpatient rehabilitation.   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   Vomiting 06/09/2023   Acute ischemic stroke (HCC)  06/09/2023   Cerebellar stroke (HCC) 02/03/2023   Ataxia, post-stroke    Gait disturbance, post-stroke    Intractable hiccoughs    Cerebellar infarction (HCC) 07/07/2018   Unstable angina (HCC) 01/26/2018   NSTEMI (non-ST elevated myocardial infarction) (HCC) 11/28/2017   Malignant essential hypertension 07/01/2016   Chest pain 07/01/2016   SOB (shortness of breath) 12/10/2011   CAD (coronary artery disease)    Hyperlipidemia    Hypertension    Tobacco abuse    Cocaine abuse (HCC)     Allergies:  Allergies  Allergen Reactions   Brilinta [Ticagrelor] Shortness Of Breath   Penicillins Rash    Has patient had a PCN reaction causing immediate rash, facial/tongue/throat swelling, SOB or lightheadedness with hypotension: Yes Has patient had a PCN reaction causing severe rash involving mucus membranes or skin necrosis: No Has patient had a PCN reaction that required hospitalization: No Has patient had a PCN reaction occurring within the last 10 years: No If all of the above answers are "NO", then may proceed with Cephalosporin use.   Medications:  Current Outpatient Medications:    acetaminophen (TYLENOL) 325 MG tablet, Take 2 tablets (650 mg total) by mouth every 4 (four) hours as needed for mild pain (or temp > 37.5 C (99.5 F))., Disp: , Rfl:    amLODipine (NORVASC) 5 MG tablet, Take 1 tablet (5 mg total) by mouth daily., Disp: 30 tablet, Rfl: 1   aspirin 81 MG chewable tablet, Chew 1 tablet (81 mg total) by mouth daily., Disp: 90 tablet, Rfl: 1   atorvastatin (LIPITOR) 80 MG tablet, Take 1 tablet (80 mg total) by mouth daily at 6 PM., Disp: 90 tablet, Rfl: 1   clopidogrel (PLAVIX) 75 MG tablet, Take 1 tablet (75 mg total) by mouth daily., Disp: 90 tablet, Rfl: 0   ezetimibe (ZETIA) 10 MG tablet, Take 1 tablet (10 mg total) by mouth daily., Disp: 90 tablet, Rfl: 1   feeding supplement (ENSURE ENLIVE / ENSURE PLUS) LIQD, Take 237 mLs by mouth 2 (two) times daily between meals., Disp:  14220 mL, Rfl: 0   losartan (COZAAR) 100 MG tablet, Take 1 tablet (100 mg total) by mouth daily., Disp: 30 tablet, Rfl: 1   nicotine (NICODERM CQ - DOSED IN MG/24 HOURS) 14 mg/24hr patch, Place 1 patch (14 mg total) onto the skin daily., Disp: 90 patch, Rfl: 0  Observations/Objective: Patient is well-developed, well-nourished in no acute distress.  Resting comfortably  at hotel  Head is normocephalic, atraumatic.  No labored breathing.  Speech is slurred  Patient is alert and oriented at baseline.  Left sided weakness to upper and lower extremities   Assessment and Plan:   1. Ischemic stroke (HCC) (Primary)  - AMB Referral [facility] Care Management  2. 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 Left-sided weakness  - AMB Referral [facility] Care Management  4. Sensory deficit, left  - AMB Referral [facility] Care Management  5. Homeless  -  AMB Referral [facility] Care Management    Fl-2 form completed today will submit to Medicaid for review   Discussed with patient the need for medications- Congregational Nurse will assist patient in setting up delivery from [facility]   Follow up in person at [facility] office 06/20/23       Follow Up Instructions: I discussed the assessment and treatment plan with the patient. The patient was provided an opportunity to ask questions and all were answered. The patient agreed with the plan and demonstrated an understanding of the instructions.  A copy of instructions were sent to the patient via MyChart unless otherwise noted below.    The patient was advised to call back or seek an in-person evaluation if the symptoms worsen or if the condition fails to improve as anticipated.    [name], FNP

## 2023-06-20 ENCOUNTER — Encounter: Payer: Medicaid Other | Admitting: Nurse Practitioner

## 2023-06-21 ENCOUNTER — Other Ambulatory Visit: Payer: Self-pay

## 2023-06-21 NOTE — Patient Outreach (Signed)
 Medicaid Managed Care Social Work Note  06/21/2023 Name:  Kenneth Morales MRN:  [id number] DOB:  10-Jan-1966  Kenneth Morales is an 58 y.o. year old male who is a primary patient of [facility]  The [facility] Managed Care Coordination team was consulted for assistance with:  Community Resources   Kenneth Morales was given information about Medicaid Managed Care Coordination team services today. Kenneth Morales Patient agreed to services and verbal consent obtained.  Engaged with patient  for by telephone forinitial visit in response to referral for case management and/or care coordination services.   Patient is participating in a Managed Medicaid Plan:  Yes  Assessments/Interventions:  Review of past medical history, allergies, medications, health status, including review of consultants reports, laboratory and other test data, was performed as part of comprehensive evaluation and provision of chronic care management services.  SDOH: (Social Drivers of Health) assessments and interventions performed: BSW completed a telephone outreach with patient and his brother. Patient stated that he is currenlty homeless he and his brother are staying in a hotel until tomorrow. Patient states that he has applied for disability and has applied for foodstamps. He did receive his foodstamp card from DSS but does not have transportation to to get to the store. Patient states he is aware of shelters but would like permanent housing. BSW explained the housing waitlist for housing authority. Patient states he was given bus passes but are now out. Patient is currently using DSS for his address. Patient, BSW and brother agreed for housing and transportation resources to be emailed to brother at [email or web address]. BSW provided brother and patient with contact information.  Advanced Directives Status:  Not addressed in this encounter.  Care Plan                 Allergies  Allergen Reactions   Brilinta  [Ticagrelor] Shortness Of Breath   Penicillins Rash    Has patient had a PCN reaction causing immediate rash, facial/tongue/throat swelling, SOB or lightheadedness with hypotension: Yes Has patient had a PCN reaction causing severe rash involving mucus membranes or skin necrosis: No Has patient had a PCN reaction that required hospitalization: No Has patient had a PCN reaction occurring within the last 10 years: No If all of the above answers are "NO", then may proceed with Cephalosporin use.    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   Vomiting 06/09/2023   Acute ischemic stroke (HCC) 06/09/2023   Cerebellar stroke (HCC) 02/03/2023   Ataxia, post-stroke    Gait disturbance, post-stroke    Intractable hiccoughs    Cerebellar infarction (HCC) 07/07/2018   Unstable angina (HCC) 01/26/2018   NSTEMI (non-ST elevated myocardial infarction) (HCC) 11/28/2017   Malignant essential hypertension 07/01/2016   Chest pain 07/01/2016   SOB (shortness of breath) 12/10/2011   CAD (coronary artery disease)    Hyperlipidemia    Hypertension    Tobacco abuse    Cocaine abuse (HCC)     Conditions to be addressed/monitored per PCP order:   housing  There are no care plans that you recently modified to display for this patient.   Follow up:  Patient agrees to Care Plan and Follow-up.  Plan: The Managed Medicaid care management team will reach out to the patient again over the next 30 days.  Date/time of  next scheduled Social Work care management/care coordination outreach:  07/19/23  Kenneth Morales, Kenneth Morales, [facility]  Managed [facility] Social Worker [phone]

## 2023-06-21 NOTE — Patient Instructions (Signed)
 Visit Information  Mr. Kenneth Morales was given information about Medicaid Managed Care team care coordination services as a part of their Healthy [facility] Medicaid benefit. Kenneth Morales verbally consented to engagement with the [facility] Managed Care team.   If you are experiencing a medical emergency, please call 911 or report to your local emergency department or urgent care.   If you have a non-emergency medical problem during routine business hours, please contact your provider's office and ask to speak with a nurse.   For questions related to your Healthy [facility] health plan, please call: [phone] or visit the homepage here: [email or web address]  If you would like to schedule transportation through your Healthy [facility] plan, please call the following number at least 2 days in advance of your appointment: [phone]  For information about your ride after you set it up, call Ride Assist at [phone]. Use this number to activate a Will Call pickup, or if your transportation is late for a scheduled pickup. Use this number, too, if you need to make a change or cancel a previously scheduled reservation.  If you need transportation services right away, call [phone]. The after-hours call center is staffed 24 hours to handle ride assistance and urgent reservation requests (including discharges) 365 days a year. Urgent trips include sick visits, hospital discharge requests and life-sustaining treatment.  Call the [facility] Line at [phone], at any time, 24 hours a day, 7 days a week. If you are in danger or need immediate medical attention call 911.  If you would like help to quit smoking, call 1-800-QUIT-NOW ((214)190-7531) OR Espaol: 1-855-Djelo-Ya (0-932-355-7322) o para ms informacin haga clic aqu or Text READY to 025-427 to register via text  Kenneth Morales - following are the goals we discussed in your visit today:   Goals  Addressed   None     Social Worker will follow up in 07/19/23.   Gus Puma, Kenard Gower, MHA [facility]  Managed Medicaid Social Worker [phone]   Following is a copy of your plan of care:  There are no care plans that you recently modified to display for this patient.

## 2023-06-21 NOTE — Congregational Nurse Program (Signed)
 RN assisting client to getting into ALF. He is getting appointment for [facility] and ID tomorrow so we can have meeting with [facility] intake coordinator Hydia. RN assisted client with a ID voucher for [employer] for clothes.

## 2023-06-27 NOTE — Congregational Nurse Program (Signed)
 Client to RN office. Hotel housing ends tomorrow morning hoping to get client into facility as soon as possible. He has done his part in providing ID and submitting application for SSA benefits for ALF [facility]. RN provided necessary documentation. The ALF has received it and states that they need the FL2 updated for accurate description of client needs. Message sent to PCP [name], and the documentation was re-completed for client. It has been resent and awaiting to hear back from ALF. They also want to know if they SSA was accepted and approved RN called office no less than 9 times to get an agent on the phone without response. Will re-attempt tomorrow.

## 2023-06-28 ENCOUNTER — Ambulatory Visit: Payer: Self-pay | Admitting: Nurse Practitioner

## 2023-06-28 NOTE — Congregational Nurse Program (Signed)
 RN attempting to get client into ALF, we have sent updated FL2 and able to speak to [facility] for update on status for application. Awaiting confirmation needed for approval from [facility] for client to get client transferred into care. RN scheduled call with Anda Kraft at 1445 today to get confirmation for client.  Update: RN was able to get letter  from [facility] for ALF to submit for review pending acceptance. Attempted to contact ALF without response will follow up tomorrow.

## 2023-06-29 NOTE — Congregational Nurse Program (Signed)
 This client is at high risk of declining by not receiving the proper care he needs. This client has just lost all his medicine accidentally, and unable to manage taking meds without assistance or reminders. He is occasionally forgetting how to swallow, and chokes/coughs frequently due to his recent stroke, possibly silently aspirating. He has had his brother Dennie Bible as his primary caregiver for the last two weeks while he has been hotel housed and Dennie Bible has left to go to [place] to be with his family and his brother cannot come with him. His brother assisted with bathing, and helping client put clothes on. Mr. Keltner is newly unhoused, cannot walk long distances, walk stairs well or stand up for more than a few minutes at a time. He doesn't know how to navigate the bus or how to get to resources provided because he is not from [place]. He cannot properly advocate for himself at this time and needs additional assistance.  RN attempted to reach out to ALF- [employer] again and RN told the coordinator will be back in touch with RN for decision on client acceptance today. Awaiting response.  RN is attempting to create a new plan for client now that hotel housing from [facility] [place], will end on 06/30/23 and we have not been given final confirmation from facility. RN has additionally called [employer], [address], [place], and [place] who all accept special assistance to see if they could accept client. RN unsuccessful at confirmation from facilities.  RN has reached out to APS and Adult Placement for help and voicemail left for callback. RN also contacted inpatient CSW to potentially help with solution, voicemail left for callback. RN reached out to client's PCP for assistance and we are attempting to coordinate care with inpatient staff that was in charge of clients care Fabio Neighbors and [employer].  RN spoke to NP at [facility] who states client should be sent back  to hospital for failure to thrive and this RN will follow through on this advice once all other options exhausted and RN will assist him and advocate for him in ED so he can receive proper care.  RN will go check on client this evening at hotel and bring him food from [facility] so he has food to eat tonight.

## 2023-06-30 ENCOUNTER — Other Ambulatory Visit: Payer: Self-pay

## 2023-06-30 ENCOUNTER — Other Ambulatory Visit: Payer: Self-pay | Admitting: Nurse Practitioner

## 2023-06-30 DIAGNOSIS — I10 Essential (primary) hypertension: Secondary | ICD-10-CM

## 2023-06-30 DIAGNOSIS — I639 Cerebral infarction, unspecified: Secondary | ICD-10-CM

## 2023-06-30 DIAGNOSIS — F17210 Nicotine dependence, cigarettes, uncomplicated: Secondary | ICD-10-CM

## 2023-06-30 MED ORDER — CLOPIDOGREL BISULFATE 75 MG PO TABS
75.0000 mg | ORAL_TABLET | Freq: Every day | ORAL | 0 refills | Status: AC
  Filled 2023-06-30: qty 30, 30d supply, fill #0
  Filled 2023-06-30: qty 90, 90d supply, fill #0

## 2023-06-30 MED ORDER — NICOTINE 14 MG/24HR TD PT24
14.0000 mg | MEDICATED_PATCH | Freq: Every day | TRANSDERMAL | 0 refills | Status: AC
  Filled 2023-06-30 (×2): qty 90, 90d supply, fill #0

## 2023-06-30 MED ORDER — EZETIMIBE 10 MG PO TABS
10.0000 mg | ORAL_TABLET | Freq: Every day | ORAL | 1 refills | Status: AC
  Filled 2023-06-30: qty 30, 30d supply, fill #0
  Filled 2023-06-30: qty 90, 90d supply, fill #0

## 2023-06-30 MED ORDER — ASPIRIN 81 MG PO CHEW
81.0000 mg | CHEWABLE_TABLET | Freq: Every day | ORAL | 1 refills | Status: AC
  Filled 2023-06-30 (×2): qty 90, 90d supply, fill #0

## 2023-06-30 MED ORDER — LOSARTAN POTASSIUM 100 MG PO TABS
100.0000 mg | ORAL_TABLET | Freq: Every day | ORAL | 1 refills | Status: AC
  Filled 2023-06-30 (×2): qty 30, 30d supply, fill #0

## 2023-06-30 MED ORDER — AMLODIPINE BESYLATE 5 MG PO TABS
5.0000 mg | ORAL_TABLET | Freq: Every day | ORAL | 1 refills | Status: AC
  Filled 2023-06-30: qty 30, 30d supply, fill #0

## 2023-06-30 MED ORDER — ATORVASTATIN CALCIUM 80 MG PO TABS
80.0000 mg | ORAL_TABLET | Freq: Every day | ORAL | 1 refills | Status: AC
  Filled 2023-06-30: qty 30, 30d supply, fill #0
  Filled 2023-06-30: qty 90, 90d supply, fill #0

## 2023-06-30 NOTE — Progress Notes (Signed)
 Patient lost his medications in an [place]. Currently un housed-receiving assistance via Congregational Nursing Program for motel- continuing to work on ALF/SNF placement    Meds ordered this encounter  Medications   amLODipine (NORVASC) 5 MG tablet    Sig: Take 1 tablet (5 mg total) by mouth daily.    Dispense:  30 tablet    Refill:  1   atorvastatin (LIPITOR) 80 MG tablet    Sig: Take 1 tablet (80 mg total) by mouth daily at 6 PM.    Dispense:  90 tablet    Refill:  1   clopidogrel (PLAVIX) 75 MG tablet    Sig: Take 1 tablet (75 mg total) by mouth daily.    Dispense:  90 tablet    Refill:  0   losartan (COZAAR) 100 MG tablet    Sig: Take 1 tablet (100 mg total) by mouth daily.    Dispense:  30 tablet    Refill:  1   ezetimibe (ZETIA) 10 MG tablet    Sig: Take 1 tablet (10 mg total) by mouth daily.    Dispense:  90 tablet    Refill:  1   nicotine (NICODERM CQ - DOSED IN MG/24 HOURS) 14 mg/24hr patch    Sig: Place 1 patch (14 mg total) onto the skin daily.    Dispense:  90 patch    Refill:  0   aspirin 81 MG chewable tablet    Sig: Chew 1 tablet (81 mg total) by mouth daily.    Dispense:  90 tablet    Refill:  1

## 2023-07-03 ENCOUNTER — Emergency Department (HOSPITAL_COMMUNITY)
Admission: EM | Admit: 2023-07-03 | Discharge: 2023-08-02 | Disposition: A | Discharge status: Skilled Nursing Facility | Attending: Emergency Medicine | Admitting: Emergency Medicine

## 2023-07-03 ENCOUNTER — Encounter (HOSPITAL_COMMUNITY): Payer: Self-pay

## 2023-07-03 ENCOUNTER — Emergency Department (HOSPITAL_COMMUNITY)

## 2023-07-03 ENCOUNTER — Other Ambulatory Visit: Payer: Self-pay

## 2023-07-03 DIAGNOSIS — W0110XA Fall on same level from slipping, tripping and stumbling with subsequent striking against unspecified object, initial encounter: Secondary | ICD-10-CM | POA: Diagnosis not present

## 2023-07-03 DIAGNOSIS — Z7982 Long term (current) use of aspirin: Secondary | ICD-10-CM | POA: Diagnosis not present

## 2023-07-03 DIAGNOSIS — Z8673 Personal history of transient ischemic attack (TIA), and cerebral infarction without residual deficits: Secondary | ICD-10-CM | POA: Diagnosis not present

## 2023-07-03 DIAGNOSIS — I2510 Atherosclerotic heart disease of native coronary artery without angina pectoris: Secondary | ICD-10-CM | POA: Insufficient documentation

## 2023-07-03 DIAGNOSIS — Z7409 Other reduced mobility: Secondary | ICD-10-CM | POA: Insufficient documentation

## 2023-07-03 DIAGNOSIS — I10 Essential (primary) hypertension: Secondary | ICD-10-CM | POA: Diagnosis not present

## 2023-07-03 DIAGNOSIS — W19XXXA Unspecified fall, initial encounter: Secondary | ICD-10-CM

## 2023-07-03 DIAGNOSIS — Z7902 Long term (current) use of antithrombotics/antiplatelets: Secondary | ICD-10-CM | POA: Diagnosis not present

## 2023-07-03 DIAGNOSIS — M791 Myalgia, unspecified site: Secondary | ICD-10-CM | POA: Insufficient documentation

## 2023-07-03 DIAGNOSIS — R2689 Other abnormalities of gait and mobility: Secondary | ICD-10-CM

## 2023-07-03 DIAGNOSIS — Y92009 Unspecified place in unspecified non-institutional (private) residence as the place of occurrence of the external cause: Secondary | ICD-10-CM | POA: Insufficient documentation

## 2023-07-03 DIAGNOSIS — Z79899 Other long term (current) drug therapy: Secondary | ICD-10-CM | POA: Insufficient documentation

## 2023-07-03 LAB — CBC WITH DIFFERENTIAL/PLATELET
Abs Immature Granulocytes: 0.03 10*3/uL (ref 0.00–0.07)
Basophils Absolute: 0 10*3/uL (ref 0.0–0.1)
Basophils Relative: 0 %
Eosinophils Absolute: 0.1 10*3/uL (ref 0.0–0.5)
Eosinophils Relative: 1 %
HCT: 46.3 % (ref 39.0–52.0)
Hemoglobin: 15.9 g/dL (ref 13.0–17.0)
Immature Granulocytes: 0 %
Lymphocytes Relative: 28 %
Lymphs Abs: 2.9 10*3/uL (ref 0.7–4.0)
MCH: 31.2 pg (ref 26.0–34.0)
MCHC: 34.3 g/dL (ref 30.0–36.0)
MCV: 90.8 fL (ref 80.0–100.0)
Monocytes Absolute: 0.7 10*3/uL (ref 0.1–1.0)
Monocytes Relative: 7 %
Neutro Abs: 6.5 10*3/uL (ref 1.7–7.7)
Neutrophils Relative %: 64 %
Platelets: 290 10*3/uL (ref 150–400)
RBC: 5.1 MIL/uL (ref 4.22–5.81)
RDW: 13.2 % (ref 11.5–15.5)
WBC: 10.3 10*3/uL (ref 4.0–10.5)
nRBC: 0 % (ref 0.0–0.2)

## 2023-07-03 LAB — COMPREHENSIVE METABOLIC PANEL
ALT: 11 U/L (ref 0–44)
AST: 21 U/L (ref 15–41)
Albumin: 3.5 g/dL (ref 3.5–5.0)
Alkaline Phosphatase: 81 U/L (ref 38–126)
Anion gap: 15 (ref 5–15)
BUN: 13 mg/dL (ref 6–20)
CO2: 21 mmol/L — ABNORMAL LOW (ref 22–32)
Calcium: 9.5 mg/dL (ref 8.9–10.3)
Chloride: 102 mmol/L (ref 98–111)
Creatinine, Ser: 1 mg/dL (ref 0.61–1.24)
GFR, Estimated: >60 mL/min (ref >60)
Glucose, Bld: 71 mg/dL (ref 70–99)
Potassium: 3.3 mmol/L — ABNORMAL LOW (ref 3.5–5.1)
Sodium: 138 mmol/L (ref 135–145)
Total Bilirubin: 2.2 mg/dL — ABNORMAL HIGH (ref 0.0–1.2)
Total Protein: 7 g/dL (ref 6.5–8.1)

## 2023-07-03 LAB — CK: Total CK: 605 U/L — ABNORMAL HIGH (ref 49–397)

## 2023-07-03 LAB — URINALYSIS, ROUTINE W REFLEX MICROSCOPIC
Bacteria, UA: NONE SEEN
Bilirubin Urine: NEGATIVE
Glucose, UA: NEGATIVE mg/dL
Ketones, ur: NEGATIVE mg/dL
Nitrite: NEGATIVE
Protein, ur: 30 mg/dL — AB
Specific Gravity, Urine: 1.019 (ref 1.005–1.030)
WBC, UA: >50 WBC/hpf (ref 0–5)
pH: 5 (ref 5.0–8.0)

## 2023-07-03 LAB — RESP PANEL BY RT-PCR (RSV, FLU A&B, COVID)  RVPGX2
Influenza A by PCR: NEGATIVE
Influenza B by PCR: NEGATIVE
Resp Syncytial Virus by PCR: NEGATIVE
SARS Coronavirus 2 by RT PCR: NEGATIVE

## 2023-07-03 LAB — MAGNESIUM: Magnesium: 2.1 mg/dL (ref 1.7–2.4)

## 2023-07-03 LAB — APTT: aPTT: 32 s (ref 24–36)

## 2023-07-03 LAB — PROTIME-INR
INR: 1 (ref 0.8–1.2)
Prothrombin Time: 13.8 s (ref 11.4–15.2)

## 2023-07-03 MED ORDER — ACETAMINOPHEN 500 MG PO TABS
1000.0000 mg | ORAL_TABLET | Freq: Three times a day (TID) | ORAL | Status: DC | PRN
  Administered 2023-07-26 – 2023-07-28 (×2): 1000 mg via ORAL
  Filled 2023-07-03 (×3): qty 2

## 2023-07-03 MED ORDER — AMLODIPINE BESYLATE 5 MG PO TABS: 5.0000 mg | ORAL_TABLET | Freq: Every day | ORAL | Status: DC

## 2023-07-03 MED ORDER — ATORVASTATIN CALCIUM 80 MG PO TABS
80.0000 mg | ORAL_TABLET | Freq: Every day | ORAL | Status: DC
  Administered 2023-07-04 – 2023-08-02 (×30): 80 mg via ORAL
  Filled 2023-07-03 (×4): qty 1
  Filled 2023-07-03: qty 2
  Filled 2023-07-03: qty 1
  Filled 2023-07-03 (×2): qty 2
  Filled 2023-07-03 (×22): qty 1

## 2023-07-03 MED ORDER — CLOPIDOGREL BISULFATE 75 MG PO TABS
75.0000 mg | ORAL_TABLET | Freq: Every day | ORAL | Status: DC
Start: 2023-07-04 — End: 2023-08-02
  Administered 2023-07-04 – 2023-08-02 (×30): 75 mg via ORAL
  Filled 2023-07-03 (×30): qty 1

## 2023-07-03 MED ORDER — LOSARTAN POTASSIUM 50 MG PO TABS
100.0000 mg | ORAL_TABLET | Freq: Every day | ORAL | Status: DC
  Administered 2023-07-04 – 2023-08-02 (×30): 100 mg via ORAL
  Filled 2023-07-03 (×30): qty 2

## 2023-07-03 MED ORDER — NICOTINE 14 MG/24HR TD PT24
14.0000 mg | MEDICATED_PATCH | Freq: Every day | TRANSDERMAL | Status: DC
  Administered 2023-07-04 – 2023-08-02 (×20): 14 mg via TRANSDERMAL
  Filled 2023-07-03 (×23): qty 1

## 2023-07-03 MED ORDER — ASPIRIN 81 MG PO TBEC: 81.0000 mg | DELAYED_RELEASE_TABLET | Freq: Every day | ORAL | Status: DC

## 2023-07-03 MED ORDER — EZETIMIBE 10 MG PO TABS
10.0000 mg | ORAL_TABLET | Freq: Every day | ORAL | Status: DC
  Administered 2023-07-04 – 2023-08-02 (×30): 10 mg via ORAL
  Filled 2023-07-03 (×35): qty 1

## 2023-07-03 NOTE — ED Notes (Signed)
 Please give update to brother Luisa Hart); [phone]

## 2023-07-03 NOTE — ED Triage Notes (Signed)
 Pt reports fall sometime yesterday and was on the floor for several hours. Pt reports medics came out and assisted him back to bed. Pt reports he has been in bed since last night. Pt reports tripping and falling between bed and wall. Pt reports hitting head. Pt reports generalized muscle pain. C-collar placed by EMS. Pt recently admitted for stroke and has been taking plavix.

## 2023-07-03 NOTE — ED Provider Notes (Signed)
  EMERGENCY DEPARTMENT AT [facility] Provider Note   CSN: [id number] Arrival date & time: 07/03/23  1956     History  Chief Complaint  Patient presents with   Marletta Lor    Kenneth Morales is a 58 y.o. male with PMH as listed below who presents with fall.  Pt reports fall sometime yesterday and was on the floor for several hours. Pt reports medics came out and assisted him back to bed.  Pt reports tripping and falling between bed and wall. Pt reports hitting head. Pt reports generalized muscle pain. C-collar placed by EMS. Patient denies head/neck pain, denies chest, abdominal, or pelvic pain. No extremity pain. Pt recently admitted for stroke and has been taking plavix.  States his mobility since being discharged has been difficult. He is not getting around well at home which resulted in the fall. He stayed on the floor for 10 hours yesterday because he did not hve the strength to get up and has been staying alone in a hotel. He denies generalized weakness but just states that since the stroke it is difficult to get around. Pt reports he was in bed all day today d/t the same reason. States he feels weaker on his left side.  Past Medical History:  Diagnosis Date   Acute MI [facility]) MAY 2013   CAD (coronary artery disease)    NSTEMI in 05/13 in setting of Cocaine use. Cath: 99% mid LCX stenosis with a large thrombus. PCI and BMS (4.0 X15 mm Vision) placement to mid LCX, LAD: 20%, RCA: 30%, EF: 60%.    Cocaine abuse (HCC)    quit in 08/2011   Depression    Hyperlipidemia    Hypertension    Spider bite    Tobacco abuse        Home Medications Prior to Admission medications   Medication Sig Start Date End Date Taking? Authorizing Provider  acetaminophen (TYLENOL) 325 MG tablet Take 2 tablets (650 mg total) by mouth every 4 (four) hours as needed for mild pain (or temp > 37.5 C (99.5 F)). 07/14/18   Angiulli, Mcarthur Rossetti, PA-C  amLODipine (NORVASC) 5 MG tablet Take 1 tablet (5  mg total) by mouth daily. 06/30/23   [name], FNP  aspirin 81 MG chewable tablet Chew 1 tablet (81 mg total) by mouth daily. 06/30/23   [name], FNP  atorvastatin (LIPITOR) 80 MG tablet Take 1 tablet (80 mg total) by mouth daily at 6 PM. 06/30/23   [name], FNP  clopidogrel (PLAVIX) 75 MG tablet Take 1 tablet (75 mg total) by mouth daily. 06/30/23   [name], FNP  ezetimibe (ZETIA) 10 MG tablet Take 1 tablet (10 mg total) by mouth daily. 06/30/23   [name], FNP  feeding supplement (ENSURE ENLIVE / ENSURE PLUS) LIQD Take 237 mLs by mouth 2 (two) times daily between meals. 06/11/23   Barnetta Chapel, MD  losartan (COZAAR) 100 MG tablet Take 1 tablet (100 mg total) by mouth daily. 06/30/23   [name], FNP  nicotine (NICODERM CQ - DOSED IN MG/24 HOURS) 14 mg/24hr patch Place 1 patch (14 mg total) onto the skin daily. 06/30/23   [name], FNP      Allergies    Brilinta [ticagrelor] and Penicillins    Review of Systems   Review of Systems A 10 point review of systems was performed and is negative unless otherwise reported in HPI.  Physical Exam Updated Vital Signs BP (!) 168/96 (  BP Location: Right Arm)   Pulse 71   Temp 99 F (37.2 C) (Oral)   Resp 18   SpO2 96%  Physical Exam General: Normal appearing male, lying in bed.  HEENT: NCAT, PERRLA, Sclera anicteric, MMM, trachea midline. No trauma noted to face. Cardiology: RRR, no murmurs/rubs/gallops. BL radial and DP pulses equal bilaterally.  Resp: Normal respiratory rate and effort. CTAB, no wheezes, rhonchi, crackles.  Abd: Soft, non-tender, non-distended. No rebound tenderness or guarding.  Pelvis: Pelvis stable/nontender MSK: No peripheral edema or signs of trauma. Extremities without deformity or TTP.  Skin: warm, dry.  Back: No CVA tenderness. No midline C, T, or L spine TTP or stepoffs. Neuro: A&Ox4, CNs II-XII grossly intact. 4/5 strength in LUE, 4/5 strength in BL LEs. Sensation grossly intact.   Psych: Normal mood and affect.   ED Results / Procedures / Treatments   Labs (all labs ordered are listed, but only abnormal results are displayed) Labs Reviewed  COMPREHENSIVE METABOLIC PANEL - Abnormal; Notable for the following components:      Result Value   Potassium 3.3 (*)    CO2 21 (*)    Total Bilirubin 2.2 (*)    All other components within normal limits  CK - Abnormal; Notable for the following components:   Total CK 605 (*)    All other components within normal limits  URINALYSIS, ROUTINE W REFLEX MICROSCOPIC - Abnormal; Notable for the following components:   Color, Urine AMBER (*)    APPearance HAZY (*)    Hgb urine dipstick SMALL (*)    Protein, ur 30 (*)    Leukocytes,Ua SMALL (*)    All other components within normal limits  RESP PANEL BY RT-PCR (RSV, FLU A&B, COVID)  RVPGX2  URINE CULTURE  CBC WITH DIFFERENTIAL/PLATELET  MAGNESIUM  APTT  PROTIME-INR    EKG EKG Interpretation Date/Time:  Sunday July 03 2023 21:28:31 EDT Ventricular Rate:  74 PR Interval:  138 QRS Duration:  100 QT Interval:  388 QTC Calculation: 430 R Axis:   -83  Text Interpretation: Normal sinus rhythm Left anterior fascicular block Minimal voltage criteria for LVH, may be normal variant ( Cornell product ) TWAs similar to prior EKGs Confirmed by Vivi Barrack [phone]) on 07/03/2023 10:48:27 PM  Radiology DG Pelvis 1-2 Views Result Date: 07/03/2023 CLINICAL DATA:  Fall yesterday. EXAM: PELVIS - 1-2 VIEW COMPARISON:  None Available. FINDINGS: The cortical margins of the bony pelvis are intact. No fracture. Pubic symphysis and sacroiliac joints are congruent. Both femoral heads are well-seated in the respective acetabula. IMPRESSION: No pelvic fracture. Electronically Signed   By: Narda Rutherford M.D.   On: 07/03/2023 22:02   DG Chest 1 View Result Date: 07/03/2023 CLINICAL DATA:  Fall yesterday EXAM: CHEST  1 VIEW COMPARISON:  Chest radiograph 06/09/2023 FINDINGS: The cardiomediastinal  contours are normal. The lungs are clear. Pulmonary vasculature is normal. No consolidation, pleural effusion, or pneumothorax. No acute osseous abnormalities are seen. IMPRESSION: No acute findings or evidence of traumatic injury. Electronically Signed   By: Narda Rutherford M.D.   On: 07/03/2023 22:01   CT Head Wo Contrast Result Date: 07/03/2023 CLINICAL DATA:  Head trauma, coagulopathy (Age 73-64y) EXAM: CT HEAD WITHOUT CONTRAST TECHNIQUE: Contiguous axial images were obtained from the base of the skull through the vertex without intravenous contrast. RADIATION DOSE REDUCTION: This exam was performed according to the departmental dose-optimization program which includes automated exposure control, adjustment of the mA and/or kV according to patient size  and/or use of iterative reconstruction technique. COMPARISON:  MRI and CT head February 13, 25. FINDINGS: Brain: Remote right parietal and right caudate infarcts. Remote left posterior basal ganglia infarct. Additional patchy white matter hypodensities are nonspecific but compatible with chronic microvascular ischemic disease. No evidence of acute large vascular territory infarct, acute hemorrhage, mass lesion, midline shift or hydrocephalus. Vascular: No hyperdense vessel. Skull: No acute fracture. Sinuses/Orbits: Clear sinuses.  No acute orbital findings. Other: No mastoid effusions. IMPRESSION: 1. No evidence of acute intracranial abnormality. 2. Remote infarcts and chronic microvascular ischemic disease. Electronically Signed   By: Feliberto Harts M.D.   On: 07/03/2023 21:16    Procedures Procedures    Medications Ordered in ED Medications  amLODipine (NORVASC) tablet 5 mg (has no administration in time range)  clopidogrel (PLAVIX) tablet 75 mg (has no administration in time range)  aspirin EC tablet 81 mg (has no administration in time range)  ezetimibe (ZETIA) tablet 10 mg (has no administration in time range)  atorvastatin (LIPITOR) tablet  80 mg (has no administration in time range)  losartan (COZAAR) tablet 100 mg (has no administration in time range)  nicotine (NICODERM CQ - dosed in mg/24 hours) patch 14 mg (has no administration in time range)  acetaminophen (TYLENOL) tablet 1,000 mg (has no administration in time range)    ED Course/ Medical Decision Making/ A&P                          Medical Decision Making Amount and/or Complexity of Data Reviewed Labs: ordered. Decision-making details documented in ED Course. Radiology: ordered. Decision-making details documented in ED Course.  Risk OTC drugs. Prescription drug management.    This patient presents to the ED for concern of fall at home w/ head trauma, this involves an extensive number of treatment options, and is a complaint that carries with it a high risk of complications and morbidity.  I considered the following differential and admission for this acute, potentially life threatening condition.   MDM:    DDX for trauma includes but is not limited to:  -Head Injury such as skull fx or ICH - on ASA/plavix, reports head trauma at home, will get [facility] -Vertebral injury - clinically cleared C-spine -Considered rhabdo if he laid on the ground for so long, CK now 605 -Patient states that he does not feel generally weak acutely.  He states that his mobility issues began after his stroke and he has been having extreme difficulty getting around.  He has not had any urinary symptoms, abdominal pain, fevers chills, flulike symptoms that would contribute to worsening weakness.  He does not have any lower back pain or new focal weakness to indicate any spinal cord pathology or new stroke.  Clinical Course as of 07/03/23 2330  Wynelle Link Jul 03, 2023  2126 CK Total(!): 605 Mildly elevated CK [HN]  2126 CBC with Differential wnl [HN]  2126 Comprehensive metabolic panel(!) Unremarkable in the context of this patient's presentation  [HN]  2145 CT Head Wo Contrast 1. No  evidence of acute intracranial abnormality. 2. Remote infarcts and chronic microvascular ischemic disease.   [HN]  2210 Urinalysis, Routine w reflex microscopic -Urine, Clean Catch(!) UA with small leukocytes, some RBCs, >50 whites, but no bacteria and 6-10 squams. Will get culture. No leukocytosis or fever, unlikely a UTI. [HN]  2246 Resp panel by RT-PCR (RSV, Flu A&B, Covid) Anterior Nasal Swab neg [HN]  2307 BSRN discussed with brother over  the phone. Brother is out of town but is really concerned about him going back to hotel alone. he is working with a caseworker now to try and get him placed somewhere, but has had to live in the hotel in the meantime [HN]  2312 D/w patient. He doesn't feel like he is able to get up and walk without two people to assist him. His only option for discharge is alone to a hotel room where he has basically laid on the ground versus in bed for the last >24 hours. Do not believe patient is a safe dispo. He does not have anyone else to help him. Will consult to social work and physical therapy for assessment.  [HN]  2326 Home meds and diet ordered [HN]    Clinical Course User Index [HN] Loetta Rough, MD    Labs: I Ordered, and personally interpreted labs.  The pertinent results include: Those listed above  Imaging Studies ordered: I ordered imaging studies including CTH,  I independently visualized and interpreted imaging. I agree with the radiologist interpretation  Additional history obtained from chart review.    Reevaluation: After the interventions noted above, I reevaluated the patient and found that they have :stayed the same  Social Determinants of Health: Lives in hotel alone currently  Disposition: Pending TOC and physical therapy eval  Co morbidities that complicate the patient evaluation  Past Medical History:  Diagnosis Date   Acute MI [facility]) MAY 2013   CAD (coronary artery disease)    NSTEMI in 05/13 in setting of Cocaine use.  Cath: 99% mid LCX stenosis with a large thrombus. PCI and BMS (4.0 X15 mm Vision) placement to mid LCX, LAD: 20%, RCA: 30%, EF: 60%.    Cocaine abuse (HCC)    quit in 08/2011   Depression    Hyperlipidemia    Hypertension    Spider bite    Tobacco abuse      Medicines Meds ordered this encounter  Medications   amLODipine (NORVASC) tablet 5 mg   clopidogrel (PLAVIX) tablet 75 mg   aspirin EC tablet 81 mg   ezetimibe (ZETIA) tablet 10 mg   atorvastatin (LIPITOR) tablet 80 mg   losartan (COZAAR) tablet 100 mg   nicotine (NICODERM CQ - dosed in mg/24 hours) patch 14 mg   acetaminophen (TYLENOL) tablet 1,000 mg    I have reviewed the patients home medicines and have made adjustments as needed  Problem List / ED Course: Problem List Items Addressed This Visit   None Visit Diagnoses       Fall in home, initial encounter    -  Primary     Decreased mobility                       This note was created using dictation software, which may contain spelling or grammatical errors.    Loetta Rough, MD 07/03/23 [phone]

## 2023-07-03 NOTE — ED Notes (Signed)
 Brother given update and reports he does not feel pt is able to care for himself. Brother lives out of town and he is in the process of setting up ALF through a program with Forensic scientist [phone]. Notes from RN are in chart

## 2023-07-03 NOTE — ED Notes (Addendum)
 Attempted to ambulate pt. Required 2 person assist, was having difficulty picking up feet and leaning to right side. Was unsuccessful to walk a couple steps with assistance.

## 2023-07-03 NOTE — Progress Notes (Deleted)
 New Patient Office Visit  Subjective    Patient ID: Kenneth Morales, male    DOB: Nov 12, 1965  Age: 58 y.o. MRN: [id number]  CC: No chief complaint on file.   HPI Kenneth Morales presents to establish care Homeless patient in motel.  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  Patient was admitted with acute ischemic stroke (speech difficulties and gait imbalance), thrombosis of renal vein and urine toxicology that was positive for cocaine.  Patient reported smoking cocaine.  MRI of the brain done on presentation revealed punctate acute infarct of the brachium pontis on the right, linear acute infarct in the right hemi pons and new small left thalamic microhemorrhage.  Neurology team directed patient's management.  Patient will be discharged on aspirin and Plavix indefinitely.  Transition of care team arranged for a safe place to discharge patient.  Input from the transition of care team is highly appreciated.  Patient will follow-up with primary care provider, cardiology and neurology team on discharge.     Acute ischemic stroke: -Neurology team directed care.  -Patient will continue aspirin and Plavix on discharge.   -Patient counseled to quit cocaine and cigarette use. -Follow-up with primary care provider, neurology and cardiology team on discharge.   Abdominal pain/possible renal vein thrombosis: -As per CT scan abdomen and pelvis with contrast. -Low threshold for further workup. -Consider ANA. -Patient uses cocaine. -Primary care provider may consider  referring patient to vascular surgery and nephrology team on discharge.   Cocaine abuse: -Counseled to quit.   Tobacco use disorder: -Counseled to quit.   Hypertension: -Losartan 100 Mg p.o. once daily. -Start amlodipine 5 Mg p.o. once daily.    Consults: neurology   Significant Diagnostic Studies:  MRI HEAD WITHOUT CONTRAST:   TECHNIQUE: Multiplanar, multiecho pulse sequences of the brain and surrounding structures were obtained without intravenous contrast.   COMPARISON:  Brain MR 04/16/2023   FINDINGS: Brain: Punctate acute infarct of the brachium pontis on the right (series 2, image 13). There is also an acute infarcts in the right hemi pons (series 2, image 18). Compared to prior exam there is new small left thalamic microhemorrhage. There is a background of moderate to severe chronic microvascular ischemic change. There is chronic infarcts in the right parietal lobe and right caudate head. There is advanced generalized volume loss.   Vascular: Normal flow voids.   Skull and upper cervical spine: Normal marrow signal.   Sinuses/Orbits: No middle ear or mastoid effusion. Paranasal sinuses are clear. Orbits are unremarkable.   Other: None.   IMPRESSION: 1. Punctate acute infarct of the brachium pontis on the right. 2. Linear acute infarct in the right hemi pons. 3. New small left thalamic microhemorrhage.   CT head: CT HEAD WITHOUT CONTRAST   TECHNIQUE: Contiguous axial images were obtained from the base of the skull through the vertex without intravenous contrast.   RADIATION DOSE REDUCTION: This exam was performed according to the departmental dose-optimization program which includes automated exposure control, adjustment of the mA and/or kV according to patient size and/or use of  iterative reconstruction technique.   COMPARISON:  MRI brain 06/09/2023   FINDINGS: Brain: Small focal hypodensity in the left paracentral pons on image 7 series 2, cannot  exclude mild chronic ischemic microvascular white matter disease or a small chronic lacunar infarct. Additional small lacunar infarct involving the head of the right caudate nucleus and adjacent anterior limb right internal capsule on image 14 series 2.   Remote right posterior watershed distribution infarct. Periventricular white matter and corona radiata hypodensities favor chronic ischemic microvascular white matter disease. This appears similar to prior.   Otherwise, the brainstem, cerebellum, cerebral peduncles, thalamus, basal ganglia, basilar cisterns, and ventricular system appear within normal limits. No intracranial hemorrhage, mass lesion, or acute CVA.   Vascular: Unremarkable   Skull: Unremarkable   Sinuses/Orbits: Unremarkable   Other: No supplemental non-categorized findings.   IMPRESSION: 1. No acute intracranial findings. 2. Remote right posterior watershed distribution infarct. 3. Small lacunar infarcts involving the head of the right caudate nucleus and adjacent anterior limb right internal capsule. 4. Periventricular white matter and corona radiata hypodensities favor chronic ischemic microvascular white matter disease. 5. Small focal hypodensity in the left paracentral pons, cannot exclude mild chronic ischemic microvascular white matter disease or a small chronic lacunar infarct.   Echo revealed: 1. Left ventricular ejection fraction, by estimation, is 40 to 45%. The  left ventricle has mildly decreased function. The left ventricle  demonstrates regional wall motion abnormalities (see scoring  diagram/findings for description). The left ventricular   internal cavity size was moderately dilated. There is moderate asymmetric  left ventricular hypertrophy of the septal segment. Left ventricular  diastolic parameters are consistent with Grade I diastolic dysfunction  (impaired relaxation).   2. Right ventricular systolic function is normal. The right  ventricular  size is normal.   3. Left atrial size was severely dilated.   4. The mitral valve is normal in structure. Trivial mitral valve  regurgitation. No evidence of mitral stenosis.   5. The aortic valve is tricuspid. Aortic valve regurgitation is mild. No  aortic stenosis is present.   6. The inferior vena cava is normal in size with greater than 50%  respiratory variability, suggesting right atrial pressure of 3 mmHg.        Lipid profile revealed LDL of 107. A1c of 5.2%   06/15/23 1000: - Client and Brother Luisa Hart) to RN office. Mr. Jorja Loa recently released from hospital from CVA, he is having difficulty with gait, speech, dressing and bathing himself. He also is newly homeless for one week. Per chart review he needs several DME's to function after discharge. It is snowing outside and client had extreme difficulty walking up hill to [facility], and walking on rock [place] to get to office. CVA with left sided weakness noted. He is at a very high risk of fall, with injury, if left outside. They have also run out of money for food and had enough funding for one nights stay after discharge. RN was able to get client approved for temporary hotel housing through the [facility] while assisting in next steps.   Client, Lavere Shinsky and RN are in agreement to get client into a facility based on his need for physical therapy, sleep therapy and his need for assistance with ADL's. RN setting up an appointment to get client seen by a PCP to be able to complete a FL2 for an ALF.    RN arranged [employer], used funding to get food and water to last for one week.  Client chart reviewed for food and he has no difficulty with swallowing, no dietary restrictions and no allergies.    He is need of a pill divider to help with medication which RN will supply on Monday.   06/16/23 0900:   RN arranged and coordinated care to assist client to get an earlier visit to be seen by provider to get an FL2 completed. RN  traveled to client's hotel room to set up virtual appointment and assist with advocating for client's needs while on call. RN they followed up with [facility] pharmacy- client stated he wasn't sure if he had all medicine. RN ensured client has all of his medicine.    RN then called [facility] who states they have a male bed available for client and can hold it for him. Contingent client completes application for social security and or disability. They will be able to accept him as long as they have proof of application completion. RN attempted to assist clients with application online and was unsuccessful and they were instructed to call over the phone and get application done today so Monday we can get him in placement.    Client is also scheduled a follow up an appointment with PCP and RN will arrange transportation for 12 pm appointment. He will then come to RN office for meeting, for continued case management to help him get an ID. Can not complete disability paperwork at this time due to lack of identification.   RN also offered spiritual care for client and brother this has been an overwhelming and challenging time in there lives. This is new to them and RN encouraged and reminded them about surrendering to God and trusting that He is more than able to help them and is with them in their pain and struggles. That this time is temporary and they will overcome. RN listened empathetically and provided therapeutic communication. Outpatient Encounter Medications as of 07/07/2023  Medication Sig   acetaminophen (TYLENOL) 325 MG tablet Take 2 tablets (650 mg total) by mouth every 4 (four) hours as needed for mild pain (or temp > 37.5 C (99.5 F)).   amLODipine (NORVASC) 5 MG tablet Take 1 tablet (5 mg total) by mouth daily.   aspirin 81 MG chewable tablet Chew 1 tablet (81 mg total) by mouth daily.   atorvastatin (LIPITOR) 80 MG tablet Take 1 tablet (80 mg total) by mouth daily at 6 PM.   clopidogrel (PLAVIX)  75 MG tablet Take 1 tablet (75 mg total) by mouth daily.   ezetimibe (ZETIA) 10 MG tablet Take 1 tablet (10 mg total) by mouth daily.   feeding supplement (ENSURE ENLIVE / ENSURE PLUS) LIQD Take 237 mLs by mouth 2 (two) times daily between meals.   losartan (COZAAR) 100 MG tablet Take 1 tablet (100 mg total) by mouth daily.   nicotine (NICODERM CQ - DOSED IN MG/24 HOURS) 14 mg/24hr patch Place 1 patch (14 mg total) onto the skin daily.   No facility-administered encounter medications on file as of 07/07/2023.    Past Medical History:  Diagnosis Date   Acute MI [facility]) MAY 2013   CAD (coronary artery disease)    NSTEMI in 05/13 in setting of Cocaine use. Cath: 99% mid LCX stenosis with a large thrombus. PCI and BMS (4.0 X15 mm Vision) placement to mid LCX, LAD: 20%, RCA: 30%, EF: 60%.    Cocaine abuse (HCC)    quit in 08/2011   Depression    Hyperlipidemia  Hypertension    Spider bite    Tobacco abuse     Past Surgical History:  Procedure Laterality Date   CARDIAC CATHETERIZATION  MAY 2013   s/p stent @ [facility]   CARDIAC CATHETERIZATION     CORONARY CTO INTERVENTION N/A 01/27/2018   Procedure: CORONARY CTO INTERVENTION;  Surgeon: Corky Crafts, MD;  Location: [facility] INVASIVE CV LAB;  Service: Cardiovascular;  Laterality: N/A;   CORONARY STENT INTERVENTION N/A 11/29/2017   Procedure: CORONARY STENT INTERVENTION;  Surgeon: Alwyn Pea, MD;  Location: [facility] INVASIVE CV LAB;  Service: Cardiovascular;  Laterality: N/A;   CORONARY STENT INTERVENTION N/A 01/27/2018   Procedure: CORONARY STENT INTERVENTION;  Surgeon: Corky Crafts, MD;  Location: [facility] INVASIVE CV LAB;  Service: Cardiovascular;  Laterality: N/A;  mid cfx   HEMORRHOID SURGERY     LEFT HEART CATH AND CORONARY ANGIOGRAPHY Right 11/29/2017   Procedure: Left heart catheterization with possible PCI;  Surgeon: Laurier Nancy, MD;  Location: [facility] INVASIVE CV LAB;  Service: Cardiovascular;  Laterality: Right;   LEFT HEART CATH  AND CORONARY ANGIOGRAPHY N/A 01/27/2018   Procedure: LEFT HEART CATH AND CORONARY ANGIOGRAPHY;  Surgeon: Corky Crafts, MD;  Location: [facility] INVASIVE CV LAB;  Service: Cardiovascular;  Laterality: N/A;   LEFT HEART CATH AND CORONARY ANGIOGRAPHY N/A 11/15/2018   Procedure: LEFT HEART CATH AND CORONARY ANGIOGRAPHY and possible pci and stent;  Surgeon: Alwyn Pea, MD;  Location: [facility] INVASIVE CV LAB;  Service: Cardiovascular;  Laterality: N/A;    Family History  Problem Relation Age of Onset   Heart attack Maternal Grandfather    Heart attack Paternal Grandfather     Social History   Socioeconomic History   Marital status: Single    Spouse name: Not on file   Number of children: Not on file   Years of education: Not on file   Highest education level: Not on file  Occupational History   Not on file  Tobacco Use   Smoking status: Every Day    Current packs/day: 0.50    Average packs/day: 0.5 packs/day for 30.0 years (15.0 ttl pk-yrs)    Types: Cigarettes   Smokeless tobacco: Never  Vaping Use   Vaping status: Never Used  Substance and Sexual Activity   Alcohol use: Yes    Alcohol/week: 6.0 standard drinks of alcohol    Types: 6 Cans of beer per week    Comment: weekly   Drug use: No    Types: Cocaine    Comment: used cocaine 15-20 years   Sexual activity: Yes    Birth control/protection: Other-see comments  Other Topics Concern   Not on file  Social History Narrative   Not on file   Social Drivers of Health   Financial Resource Strain: Not on file  Food Insecurity: Food Insecurity Present (06/11/2023)   Hunger Vital Sign    Worried About Running Out of Food in the Last Year: Sometimes true    Ran Out of Food in the Last Year: Never true  Transportation Needs: No Transportation Needs (06/11/2023)   PRAPARE - Administrator, Civil Service (Medical): No    Lack of Transportation (Non-Medical): No  Physical Activity: Not on file  Stress: Not on file   Social Connections: Not on file  Intimate Partner Violence: Not At Risk (06/11/2023)   Humiliation, Afraid, Rape, and Kick questionnaire    Fear of Current or Ex-Partner: No    Emotionally Abused: No  Physically Abused: No    Sexually Abused: No    ROS      Objective    There were no vitals taken for this visit.  Physical Exam  {Labs (Optional):23779}    Assessment & Plan:   Problem List Items Addressed This Visit   None   No follow-ups on file.   Shan Levans, MD

## 2023-07-04 MED ORDER — NICOTINE POLACRILEX 2 MG MT GUM: 2.0000 mg | CHEWING_GUM | OROMUCOSAL | Status: AC | PRN

## 2023-07-04 MED ORDER — NICOTINE 14 MG/24HR TD PT24: 14.0000 mg | MEDICATED_PATCH | Freq: Every day | TRANSDERMAL | Status: DC

## 2023-07-04 MED ORDER — AMLODIPINE BESYLATE 5 MG PO TABS
5.0000 mg | ORAL_TABLET | Freq: Every day | ORAL | Status: DC
  Administered 2023-07-04 – 2023-08-02 (×30): 5 mg via ORAL
  Filled 2023-07-04 (×30): qty 1

## 2023-07-04 MED ORDER — LOSARTAN POTASSIUM 50 MG PO TABS
100.0000 mg | ORAL_TABLET | Freq: Every day | ORAL | Status: DC
Start: 2023-07-04 — End: 2023-07-04

## 2023-07-04 MED ORDER — ATORVASTATIN CALCIUM 40 MG PO TABS: 80.0000 mg | ORAL_TABLET | Freq: Every day | ORAL | Status: DC

## 2023-07-04 MED ORDER — EZETIMIBE 10 MG PO TABS: 10.0000 mg | ORAL_TABLET | Freq: Every day | ORAL | Status: DC

## 2023-07-04 MED ORDER — ASPIRIN 81 MG PO TBEC
81.0000 mg | DELAYED_RELEASE_TABLET | Freq: Every day | ORAL | Status: DC
  Administered 2023-07-04 – 2023-08-02 (×30): 81 mg via ORAL
  Filled 2023-07-04 (×30): qty 1

## 2023-07-04 MED ORDER — CLOPIDOGREL BISULFATE 75 MG PO TABS: 75.0000 mg | ORAL_TABLET | Freq: Every day | ORAL | Status: DC

## 2023-07-04 NOTE — Congregational Nurse Program (Signed)
 0930: RN called Talbert Forest at [employer] to help with LTC/ Specialty care for this client at the recommendation of contact provided by Robyne Peers. No voicemail left because RN pulling up client chart and sees client is now in hospital for fall at hotel provided by CNP. RN called CSW Okey Regal and vm left. However, RN spoke with her via secure chat to advocate for this client and his needs. He is clearly unable to manage without assistance and is failing to thrive at basic care for himself. He is more appropriate for SNF based on increased ADL needs.   RN updated brother about plan. He states that Kenneth Morales originally had a fall on Saturday and was down 10 hours and paramedics called and assisted him back to bed and client refused hospital services at that time. On Sunday afternoon client never moved from out of the bed because he was afraid to fall again, he didn't eat anything or go to the bathroom. So EMS called again and took client to hospital.  CNP has now cancelled hotel as this is no longer an appropriate resource for client based on increased needs. RN will picked up client belongings and returned them to client in ED: including cane, jacket, clothes, medications, client states he doesn't have phone and RN will call hotel and see if it has been found in hotel.

## 2023-07-04 NOTE — Progress Notes (Addendum)
 3:25pm: CSW received call from Erskine Squibb, Sports coach from [employer] regarding patient. Erskine Squibb states patient is scheduled for a visit on Thursday at the clinic.  1pm: CSW sent message to [facility] requesting clarity on if facility would considre an LOG for patient - currently waiting for response.  10:30am: CSW spoke with Raven, RN via secure chat. Raven states patient's disability application was just submitted last week so it could take several months for an outcome to be known. Raven states the patient's hotel room has been paid for through Friday of this week. Raven states patient has no caregiver at the hotel and it is unsafe for him to return there.  CSW will complete FL2 and fax patient's clinicals out in attempts to obtain a bed offer.  9:45am: CSW spoke with patient at bedside. Patient reports he wants to be placed in a facility for LTC. Patient states he was previously staying at the Extended Stay on [facility] that was being funded by the [employer]. Patient states he does not have any income, he only has [employer]. Patient states he has a brother but he does not live nearby. Patient agreeable for CSW to attempt to locate a SNF that can possibly accept him. Patient understands possible barriers to placement including recent drug use and homelessness.  CSW spoke with Hydia at [facility] who states the current plan is for patient to admit to the facility once information is available regarding the outcome of patient's SSA hearing last week. Hydia states Raven, RN has been in contact with SSA regarding patient's disability case. Hydia states she does not need anything from CSW at this time.  8:45am: CSW attempted to reach Raven, RN of Congregational Nursing without success - a voicemail was left requesting a return call.  Edwin Dada, MSW, LCSW Transitions of Care  Clinical Social Worker II [phone]

## 2023-07-04 NOTE — Congregational Nurse Program (Signed)
 06/30/23 0900: RN able to extend housing instead of taking client to hospital for failure to thrive. Client reassuring RN he will be ok by himself in hotel. RN reached out again to Hydia at [facility] for follow up voicemail left. RN reached out to clients PCP since he lost all his medicines in an [employer] [facility] ride. RN able to get new scripts and went to pharmacy and delivered to client. RN given resource by Robyne Peers RN to reach out to [facility] for possible admission. RN called [facility] and they stated coordinator had left office and to call back to tomorrow.  07/01/2023 1400: RN provided client with meals to last until next week. Client is in good spirits. RN reached out to [employer] twice and voicemail left with staff to return call. RN spoke to Star B. Who will contact find resources at DSS who may be able to assist with options for getting this client placement in a SNF.

## 2023-07-04 NOTE — Evaluation (Signed)
 Physical Therapy Evaluation Patient Details Name: Kenneth Morales MRN: [id number] DOB: January 08, 1966 Today's Date: 07/04/2023  History of Present Illness  58 y.o. male presents to [facility] hospital on 07/03/2023 after a fall. PMH includes MI, CAD, cocaine abuse, depression, HLD, HTN, CVA.  Clinical Impression  Pt presents to PT with deficits in functional mobility, balance, strength, power. Pt is able to roll to each side in bed and move into long sitting without physical assistance, although dependent on bed railings. Pt does not follow mutli-step commands well from this PT in an effort to improve bed mobility efficiency. Pt becomes mildly agitated with PT attempts to encourage independent mobility, then refuses to sit at the side of the bed. Pt does appear to have deficits in strength and has been unable to ambulate well in the ED per chart review. Pt has a flight of steps to ascend to get to his hotel room, and has no identifiable caregiver support. PT will continue to assess mobility quality however at this time the patient will benefit from continued inpatient follow up therapy, <3 hours/day.        If plan is discharge home, recommend the following: A little help with walking and/or transfers;A lot of help with bathing/dressing/bathroom;Assistance with cooking/housework;Help with stairs or ramp for entrance   Can travel by private vehicle   No    Equipment Recommendations  (TBD pending further assessment)  Recommendations for Other Services       Functional Status Assessment Patient has had a recent decline in their functional status and demonstrates the ability to make significant improvements in function in a reasonable and predictable amount of time.     Precautions / Restrictions Precautions Precautions: Fall Recall of Precautions/Restrictions: Impaired Restrictions Weight Bearing Restrictions Per Provider Order: No      Mobility  Bed Mobility Overal bed mobility: Needs  Assistance Bed Mobility: Rolling, Supine to Sit Rolling: Contact guard assist, Used rails   Supine to sit: Contact guard, HOB elevated, Used rails (supine to long sitting)     General bed mobility comments: Pt initially requesting PT physical assistance for bed mobility prior to attempting. pt has difficulty following multi-step cues from PT to improve bed mobility efficiency.Pt rolls to each side, also ascends into long sitting demonstrating fair core strength. Pt later refuses to sit at the edge of bed, reports he is too weak to sit up at this time.    Transfers Overall transfer level:  (pt refuses attempts)                      Ambulation/Gait                  Stairs            Wheelchair Mobility     Tilt Bed    Modified Rankin (Stroke Patients Only)       Balance Overall balance assessment: Needs assistance Sitting-balance support: Bilateral upper extremity supported, Feet supported Sitting balance-Leahy Scale: Poor Sitting balance - Comments: pt briefly sits in long sitting on stretcher with BUE support Postural control: Posterior lean                                   Pertinent Vitals/Pain Pain Assessment Pain Assessment: Faces Faces Pain Scale: Hurts little more Pain Location: generalized Pain Descriptors / Indicators: Sore Pain Intervention(s): Limited activity within patient's tolerance  Home Living Family/patient expects to be discharged to:: Other (Comment) (hotel) Living Arrangements: Alone Available Help at Discharge:  (none identified) Type of Home: Other(Comment) (hotel)           Home Equipment: None (pt reports no longer owning DME, did have a cane, rollator and shower seat one month ago at the time of last PT evaluation)      Prior Function Prior Level of Function : History of Falls (last six months)             Mobility Comments: pt reports 2 recent falls, has been ambulating without DME ADLs  Comments: pt reports he has been unable to bathe and dress himself, reports he microwaves his meals     Extremity/Trunk Assessment   Upper Extremity Assessment Upper Extremity Assessment: LUE deficits/detail LUE Deficits / Details: at least 4-/5 based on observation although not formally assessed    Lower Extremity Assessment Lower Extremity Assessment: LLE deficits/detail LLE Deficits / Details: at least 4-/5 based on observed mobility although not formally assessed due to limited patient participation    Cervical / Trunk Assessment Cervical / Trunk Assessment: Normal  Communication   Communication Communication: No apparent difficulties    Cognition Arousal: Alert Behavior During Therapy: Impulsive   PT - Cognitive impairments: Awareness, Attention, Problem solving                       PT - Cognition Comments: pt with poor multi-step command following Following commands: Impaired Following commands impaired: Follows multi-step commands inconsistently     Cueing Cueing Techniques: Verbal cues, Visual cues, Gestural cues     General Comments General comments (skin integrity, edema, etc.): VSS on RA    Exercises     Assessment/Plan    PT Assessment Patient needs continued PT services  PT Problem List Decreased strength;Decreased activity tolerance;Decreased balance;Decreased mobility;Decreased knowledge of use of DME;Decreased safety awareness;Decreased knowledge of precautions       PT Treatment Interventions DME instruction;Gait training;Stair training;Functional mobility training;Therapeutic activities;Therapeutic exercise;Balance training;Neuromuscular re-education;Patient/family education    PT Goals (Current goals can be found in the Care Plan section)  Acute Rehab PT Goals Patient Stated Goal: to stop falling PT Goal Formulation: With patient Time For Goal Achievement: 07/18/23 Potential to Achieve Goals: Fair    Frequency Min 2X/week      Co-evaluation               AM-PAC PT "6 Clicks" Mobility  Outcome Measure Help needed turning from your back to your side while in a flat bed without using bedrails?: A Little Help needed moving from lying on your back to sitting on the side of a flat bed without using bedrails?: A Little Help needed moving to and from a bed to a chair (including a wheelchair)?: A Lot Help needed standing up from a chair using your arms (e.g., wheelchair or bedside chair)?: A Lot Help needed to walk in hospital room?: A Lot Help needed climbing 3-5 steps with a railing? : Total 6 Click Score: 13    End of Session   Activity Tolerance: Other (comment) (pt is a limited participant, reporting he is too weak to mobilize) Patient left: in bed (pt in hallway bed in ED, no call bell available) Nurse Communication: Mobility status PT Visit Diagnosis: Other abnormalities of gait and mobility (R26.89);Muscle weakness (generalized) (M62.81)    Time: 4098-1191 PT Time Calculation (min) (ACUTE ONLY): 11 min   Charges:  PT Evaluation $PT Eval Low Complexity: 1 Low   PT General Charges $$ ACUTE PT VISIT: 1 Visit         Arlyss Gandy, PT, DPT Acute Rehabilitation Office [phone]   Arlyss Gandy 07/04/2023, 8:49 AM

## 2023-07-04 NOTE — [place] FL2 (Signed)
 [place] MEDICAID FL2 LEVEL OF CARE FORM     IDENTIFICATION  Patient Name: Kenneth Morales Birthdate: April 03, 1966 Sex: male Admission Date (Current Location): 07/03/2023  [place] and [place] Number:  Haynes Bast [id number] Facility and Address:  [facility], 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   Ataxia, post-stroke    Gait disturbance, post-stroke    Intractable hiccoughs    Cerebellar infarction (HCC) 07/07/2018   Unstable angina (HCC) 01/26/2018   NSTEMI (non-ST elevated myocardial infarction) (HCC) 11/28/2017   Malignant essential hypertension 07/01/2016   Chest pain 07/01/2016   SOB (shortness of breath) 12/10/2011   CAD (coronary artery disease)    Hyperlipidemia    Hypertension    Tobacco abuse    Cocaine abuse (HCC)     Orientation RESPIRATION BLADDER Height & Weight     Self, Place, Situation, Time  Normal Continent Weight:   Height:     BEHAVIORAL SYMPTOMS/MOOD NEUROLOGICAL BOWEL NUTRITION STATUS      Continent Diet (Regular Diet)  AMBULATORY STATUS COMMUNICATION OF NEEDS Skin   Extensive Assist Verbally Normal                       Personal Care Assistance Level of Assistance  Bathing, Feeding, Dressing Bathing Assistance: Maximum assistance Feeding assistance: Limited assistance Dressing Assistance: Maximum assistance     Functional Limitations Info  Sight,  Hearing, Speech Sight Info: Adequate Hearing Info: Adequate Speech Info: Adequate    SPECIAL CARE FACTORS FREQUENCY  PT (By licensed PT), OT (By licensed OT)     PT Frequency: 5x weekly OT Frequency: 5x weekly            Contractures Contractures Info: Not present    Additional Factors Info  Code Status, Allergies Code Status Info: Full Code Allergies Info: Brilinta (Ticagrelor)  Penicillins           Current Medications (07/04/2023):  This is the current hospital active medication list Current Facility-Administered Medications  Medication Dose Route Frequency Provider Last Rate Last Admin   acetaminophen (TYLENOL) tablet 1,000 mg  1,000 mg Oral Q8H PRN Loetta Rough, MD       amLODipine (NORVASC) tablet 5 mg  5 mg Oral Daily Arby Barrette, MD   5 mg at 07/04/23 7425   aspirin EC tablet 81 mg  81 mg Oral Daily Arby Barrette, MD   81 mg at 07/04/23 0940   atorvastatin (LIPITOR) tablet 80 mg  80 mg Oral Daily Loetta Rough, MD       clopidogrel (PLAVIX) tablet 75 mg  75 mg Oral Daily Loetta Rough, MD   75 mg at 07/04/23 0940   ezetimibe (ZETIA) tablet 10 mg  10 mg Oral Daily Loetta Rough, MD   10 mg at 07/04/23 0939   losartan (COZAAR) tablet 100 mg  100 mg Oral Daily Jearld Fenton,  Vanessa Barbara, MD   100 mg at 07/04/23 4098   nicotine (NICODERM CQ - dosed in mg/24 hours) patch 14 mg  14 mg Transdermal Daily Loetta Rough, MD   14 mg at 07/04/23 1191   nicotine polacrilex (NICORETTE) gum 2 mg  2 mg Oral PRN Arby Barrette, MD       Current Outpatient Medications  Medication Sig Dispense Refill   acetaminophen (TYLENOL) 325 MG tablet Take 2 tablets (650 mg total) by mouth every 4 (four) hours as needed for mild pain (or temp > 37.5 C (99.5 F)).     amLODipine (NORVASC) 5 MG tablet Take 1 tablet (5 mg total) by mouth daily. 30 tablet 1   aspirin 81 MG chewable tablet Chew 1 tablet (81 mg total) by mouth daily. 90 tablet 1   atorvastatin (LIPITOR) 80 MG tablet Take 1  tablet (80 mg total) by mouth daily at 6 PM. 90 tablet 1   clopidogrel (PLAVIX) 75 MG tablet Take 1 tablet (75 mg total) by mouth daily. 90 tablet 0   ezetimibe (ZETIA) 10 MG tablet Take 1 tablet (10 mg total) by mouth daily. 90 tablet 1   feeding supplement (ENSURE ENLIVE / ENSURE PLUS) LIQD Take 237 mLs by mouth 2 (two) times daily between meals. 14220 mL 0   losartan (COZAAR) 100 MG tablet Take 1 tablet (100 mg total) by mouth daily. 30 tablet 1   nicotine (NICODERM CQ - DOSED IN MG/24 HOURS) 14 mg/24hr patch Place 1 patch (14 mg total) onto the skin daily. 90 patch 0     Discharge Medications: Please see discharge summary for a list of discharge medications.  Relevant Imaging Results:  Relevant Lab Results:   Additional Information SSN: [id number]  Inis Sizer, LCSW

## 2023-07-05 LAB — URINE CULTURE: Culture: NO GROWTH

## 2023-07-05 NOTE — ED Provider Notes (Signed)
 Emergency Medicine Observation Re-evaluation Note  Kenneth Morales is a 58 y.o. male, seen on rounds today.  Pt initially presented to the ED for complaints of Fall Currently, the patient does not have any acute complaints  Physical Exam  BP (!) 147/93 (BP Location: Right Arm)   Pulse 72   Temp (!) 96.9 F (36.1 C) (Temporal)   Resp 10   SpO2 99%  Physical Exam General: Resting comfortably in stretcher Lungs: Normal work of breathing Psych: Calm  ED Course / MDM  EKG:EKG Interpretation Date/Time:  Sunday July 03 2023 21:28:31 EDT Ventricular Rate:  74 PR Interval:  138 QRS Duration:  100 QT Interval:  388 QTC Calculation: 430 R Axis:   -83  Text Interpretation: Normal sinus rhythm Left anterior fascicular block Minimal voltage criteria for LVH, may be normal variant ( Cornell product ) TWAs similar to prior EKGs Confirmed by Vivi Barrack [phone]) on 07/03/2023 10:48:27 PM  I have reviewed the labs performed to date as well as medications administered while in observation.  Recent changes in the last 24 hours include seen by physical therapy.  Appears that he is being recommended for inpatient follow-up therapy  Plan  Current plan is for placement.    Rondel Baton, MD 07/05/23 [phone]

## 2023-07-05 NOTE — Progress Notes (Addendum)
 10:15am: CSW spoke with Tammy at [facility] to discuss patient. Tammy states that since patient has Managed Medicaid, a hospital issued LOG will only cover for 3 months and it will likely take longer for a SSA decision. Tammy states she may be able to accept patient if the LOG can cover patient until the disability is approved.  CSW presented information to [facility] Supervisor for further review.  9am: CSW spoke with [facility] Supervisor who recommended CSW search for a facility to take patient as LOG for at SNF instead of ALF.  CSW attempted to reach Tammy at [facility] facilities without success.  CSW spoke with Revonda Standard at [facility] to discuss patient - Revonda Standard states neither facility will be able to accept patient.  8am: Patient has not received any bed offers for rehab.   CSW sent additional message to Hydia at [facility] requesting clarity on an LOG.  Edwin Dada, MSW, LCSW Transitions of Care  Clinical Social Worker II [phone]

## 2023-07-06 NOTE — Progress Notes (Signed)
 Physical Therapy Treatment Patient Details Name: Kenneth Morales MRN: [id number] DOB: 07/17/1965 Today's Date: 07/06/2023   History of Present Illness 58 y.o. male presents to [facility] hospital on 07/03/2023 after a fall. PMH includes MI, CAD, cocaine abuse, depression, HLD, HTN, CVA.    PT Comments  Pt admitted with above diagnosis. Pt was able to stand to RW with min assist +2 but could not progress to ambulation as right LE weakness noted.  Pt did do better today than previous session and did tolerate incr mobility. Continue to recommend post acute rehab < 3 hours day.  Pt currently with functional limitations due to the deficits listed below (see PT Problem List). Pt will benefit from acute skilled PT to increase their independence and safety with mobility to allow discharge.       If plan is discharge home, recommend the following: A little help with walking and/or transfers;A lot of help with bathing/dressing/bathroom;Assistance with cooking/housework;Help with stairs or ramp for entrance   Can travel by private vehicle     No  Equipment Recommendations   (TBD pending further assessment)    Recommendations for Other Services       Precautions / Restrictions Precautions Precautions: Fall Recall of Precautions/Restrictions: Impaired Restrictions Weight Bearing Restrictions Per Provider Order: No     Mobility  Bed Mobility Overal bed mobility: Needs Assistance Bed Mobility: Rolling, Supine to Sit Rolling: Contact guard assist, Used rails   Supine to sit: Mod assist     General bed mobility comments: Pt needed assist to elevate trunk.    Transfers Overall transfer level: Needs assistance Equipment used: Rolling walker (2 wheels) Transfers: Sit to/from Stand Sit to Stand: Min assist, +2 safety/equipment, From elevated surface           General transfer comment: Pt needing min assist to stand to feet with RW. Pt not standing fully upright. Pt was able to take a few steps  toward right side with difficulty moving right LE without assist.    Ambulation/Gait                   Stairs             Wheelchair Mobility     Tilt Bed    Modified Rankin (Stroke Patients Only)       Balance Overall balance assessment: Needs assistance Sitting-balance support: Feet supported, No upper extremity supported Sitting balance-Leahy Scale: Fair     Standing balance support: Bilateral upper extremity supported, During functional activity Standing balance-Leahy Scale: Poor Standing balance comment: relying on RW for UE support                            Communication Communication Communication: No apparent difficulties  Cognition Arousal: Alert Behavior During Therapy: Impulsive   PT - Cognitive impairments: Awareness, Attention, Problem solving                       PT - Cognition Comments: pt with poor multi-step command following Following commands: Impaired Following commands impaired: Follows multi-step commands inconsistently    Cueing Cueing Techniques: Verbal cues, Visual cues, Gestural cues  Exercises General Exercises - Lower Extremity Ankle Circles/Pumps: AROM, Both, 10 reps, Seated Long Arc Quad: AROM, Both, 10 reps, Seated    General Comments General comments (skin integrity, edema, etc.): VSS on RA      Pertinent Vitals/Pain Pain Assessment Pain Assessment: Faces Faces Pain  Scale: Hurts little more Pain Location: generalized Pain Descriptors / Indicators: Sore Pain Intervention(s): Limited activity within patient's tolerance, Monitored during session, Repositioned    Home Living                          Prior Function            PT Goals (current goals can now be found in the care plan section) Acute Rehab PT Goals Patient Stated Goal: to stop falling Progress towards PT goals: Progressing toward goals    Frequency    Min 2X/week      PT Plan      Co-evaluation               AM-PAC PT "6 Clicks" Mobility   Outcome Measure  Help needed turning from your back to your side while in a flat bed without using bedrails?: A Little Help needed moving from lying on your back to sitting on the side of a flat bed without using bedrails?: A Little Help needed moving to and from a bed to a chair (including a wheelchair)?: A Lot Help needed standing up from a chair using your arms (e.g., wheelchair or bedside chair)?: Total Help needed to walk in hospital room?: Total Help needed climbing 3-5 steps with a railing? : Total 6 Click Score: 11    End of Session Equipment Utilized During Treatment: Gait belt Activity Tolerance: Patient limited by fatigue Patient left: with call bell/phone within reach;with bed alarm set (pt on stretcher) Nurse Communication: Mobility status PT Visit Diagnosis: Other abnormalities of gait and mobility (R26.89);Muscle weakness (generalized) (M62.81)     Time: 1610-9604 PT Time Calculation (min) (ACUTE ONLY): 18 min  Charges:    $Therapeutic Activity: 8-22 mins PT General Charges $$ ACUTE PT VISIT: 1 Visit                     Talayeh Bruinsma M,PT Acute Rehab Services [phone]    Bevelyn Buckles 07/06/2023, 1:58 PM

## 2023-07-06 NOTE — ED Provider Notes (Signed)
 Emergency Medicine Observation Re-evaluation Note  Kenneth Morales is a 58 y.o. male, seen on rounds today.  Pt initially presented to the ED for complaints of Fall Currently, the patient is resting.  Physical Exam  BP (!) 166/102   Pulse 77   Temp 98 F (36.7 C) (Oral)   Resp 11   Ht 5\' 10"  (1.778 m)   Wt 72.6 kg   SpO2 99%   BMI 22.96 kg/m  Physical Exam General: NAD   ED Course / MDM  EKG:EKG Interpretation Date/Time:  Sunday July 03 2023 21:28:31 EDT Ventricular Rate:  74 PR Interval:  138 QRS Duration:  100 QT Interval:  388 QTC Calculation: 430 R Axis:   -83  Text Interpretation: Normal sinus rhythm Left anterior fascicular block Minimal voltage criteria for LVH, may be normal variant ( Cornell product ) TWAs similar to prior EKGs Confirmed by Vivi Barrack [phone]) on 07/03/2023 10:48:27 PM  I have reviewed the labs performed to date as well as medications administered while in observation.  Recent changes in the last 24 hours include no acute events reported.  Plan  Current plan is for SW placement.    Wynetta Fines, MD 07/06/23 [phone]

## 2023-07-07 ENCOUNTER — Ambulatory Visit: Admitting: Critical Care Medicine

## 2023-07-07 NOTE — ED Notes (Signed)
 Please give update to brother Luisa Hart); [phone]

## 2023-07-07 NOTE — ED Notes (Signed)
 Family (Patient's brother, Luisa Hart, [phone]) updated as to patient's status.

## 2023-07-07 NOTE — Congregational Nurse Program (Signed)
 RN visited client in hospital to bring him phone that was accidentally left in hotel housing. RN went to [employer] and [employer], engineering necessities for client to have for when he transitions to SNF. He did endorse he did have a fall yesterday but he is doing good otherwise. RN updated him on plan of care as far as housing and spoke with his RN about plan.

## 2023-07-08 NOTE — Progress Notes (Signed)
 Physical Therapy Treatment Patient Details Name: Kenneth Morales MRN: [id number] DOB: Apr 23, 1966 Today's Date: 07/08/2023   History of Present Illness 58 y.o. male presents to [facility] hospital on 07/03/2023 after a fall. PMH includes MI, CAD, cocaine abuse, depression, HLD, HTN, CVA.    PT Comments  The pt was agreeable to session, able to make good progress with ambulation distance this session. He was able to complete sit-stand with less assistance and multiple short bouts of walking in the room with RW. He remains dependent on BUE support and min-modA to maintain balance with gait, demos poor R hip flexion and difficulty clearing R toes. No overt buckling, but single LOB with turning to sit in chair as pt letting go of RW and moving resulting in LOB to R. Will continue to benefit from skilled PT to progress functional mobility, strength, and activity tolerance.     If plan is discharge home, recommend the following: A little help with walking and/or transfers;A lot of help with bathing/dressing/bathroom;Assistance with cooking/housework;Help with stairs or ramp for entrance   Can travel by private vehicle     No  Equipment Recommendations   (TBD pending further assessment)    Recommendations for Other Services       Precautions / Restrictions Precautions Precautions: Fall Recall of Precautions/Restrictions: Impaired Restrictions Weight Bearing Restrictions Per Provider Order: No     Mobility  Bed Mobility Overal bed mobility: Needs Assistance Bed Mobility: Rolling, Supine to Sit, Sit to Supine Rolling: Contact guard assist, Used rails   Supine to sit: Min assist Sit to supine: Min assist   General bed mobility comments: pt pulling on PT to come to long sitting, then able to complete scooting with repeated cues, increased time. minA to return to bed    Transfers Overall transfer level: Needs assistance Equipment used: Rolling walker (2 wheels) Transfers: Sit to/from Stand, Bed  to chair/wheelchair/BSC Sit to Stand: Min assist, From elevated surface (ED stretcher)   Step pivot transfers: Mod assist       General transfer comment: pt able to rise from ED stretcher with minA to maintain RW and for safety. modA with pivot to recliner as pt letting go of RW and with LOB to R.    Ambulation/Gait Ambulation/Gait assistance: Min assist, +2 safety/equipment Gait Distance (Feet): 7 Feet (+8 ft) Assistive device: Rolling walker (2 wheels) Gait Pattern/deviations: Step-through pattern, Decreased stride length, Decreased dorsiflexion - right, Narrow base of support Gait velocity: decreased Gait velocity interpretation: <1.31 ft/sec, indicative of household ambulator   General Gait Details: slowed pace poor R hip flexion and clearance of R toes       Balance Overall balance assessment: Needs assistance Sitting-balance support: Feet supported, No upper extremity supported Sitting balance-Leahy Scale: Fair     Standing balance support: Bilateral upper extremity supported, During functional activity Standing balance-Leahy Scale: Poor Standing balance comment: relying on RW for UE support                            Communication Communication Communication: No apparent difficulties  Cognition Arousal: Alert Behavior During Therapy: Impulsive   PT - Cognitive impairments: Awareness, Attention, Problem solving                       PT - Cognition Comments: pt with poor multi-step command following, poor insight to sfaety, impulsive at times Following commands: Impaired Following commands impaired: Follows multi-step commands inconsistently  Cueing Cueing Techniques: Verbal cues, Visual cues, Gestural cues  Exercises      General Comments General comments (skin integrity, edema, etc.): VSS oN RA      Pertinent Vitals/Pain Pain Assessment Faces Pain Scale: Hurts little more Pain Location: generalized Pain Descriptors / Indicators:  Sore     PT Goals (current goals can now be found in the care plan section) Acute Rehab PT Goals Patient Stated Goal: to stop falling PT Goal Formulation: With patient Time For Goal Achievement: 07/18/23 Potential to Achieve Goals: Fair Progress towards PT goals: Progressing toward goals    Frequency    Min 2X/week       AM-PAC PT "6 Clicks" Mobility   Outcome Measure  Help needed turning from your back to your side while in a flat bed without using bedrails?: A Little Help needed moving from lying on your back to sitting on the side of a flat bed without using bedrails?: A Little Help needed moving to and from a bed to a chair (including a wheelchair)?: A Lot Help needed standing up from a chair using your arms (e.g., wheelchair or bedside chair)?: A Lot Help needed to walk in hospital room?: A Lot Help needed climbing 3-5 steps with a railing? : Total 6 Click Score: 13    End of Session Equipment Utilized During Treatment: Gait belt Activity Tolerance: Patient limited by fatigue Patient left: with call bell/phone within reach;with bed alarm set (pt on stretcher) Nurse Communication: Mobility status PT Visit Diagnosis: Other abnormalities of gait and mobility (R26.89);Muscle weakness (generalized) (M62.81)     Time: 2130-8657 PT Time Calculation (min) (ACUTE ONLY): 19 min  Charges:    $Gait Training: 8-22 mins PT General Charges $$ ACUTE PT VISIT: 1 Visit                     Vickki Muff, PT, DPT   Acute Rehabilitation Department Office [phone] Secure Chat Communication Preferred   Ronnie Derby 07/08/2023, 3:52 PM

## 2023-07-08 NOTE — TOC CM/SW Note (Signed)
 SW spoke [employer] [facility] [phone]) regarding LOG beds through [facility] for patient skilled bed. Tammy stated patient will need a continued 3 month LOG to cover room/board/therapy due to patient's disability pending/ Patient has [facility], as well patient will not have a safe discharge if placed in SNF Tammy stated because he is homeless.   Patient has no bed offers at this time for SNF.   Lily Peer, MSW, LCSWA Transition of Care  Clinical Social Worker (ED 3-11 Mon-Fri)  [phone]

## 2023-07-08 NOTE — ED Provider Notes (Signed)
 Emergency Medicine Observation Re-evaluation Note  Kenneth Morales is a 58 y.o. male, seen on rounds today.  Pt initially presented to the ED for complaints of Fall Currently, the patient is resting.  Physical Exam  BP (!) 145/88   Pulse 66   Temp 98.4 F (36.9 C) (Oral)   Resp 11   Ht 5\' 10"  (1.778 m)   Wt 72.6 kg   SpO2 100%   BMI 22.96 kg/m  Physical Exam General: NAD Cardiac: Regular heart rate Lungs: No respiratory distress Psych: Stable  ED Course / MDM  EKG:EKG Interpretation Date/Time:  Sunday July 03 2023 21:28:31 EDT Ventricular Rate:  74 PR Interval:  138 QRS Duration:  100 QT Interval:  388 QTC Calculation: 430 R Axis:   -83  Text Interpretation: Normal sinus rhythm Left anterior fascicular block Minimal voltage criteria for LVH, may be normal variant ( Cornell product ) TWAs similar to prior EKGs Confirmed by Vivi Barrack [phone]) on 07/03/2023 10:48:27 PM  I have reviewed the labs performed to date as well as medications administered while in observation.    Plan  Current plan is for [facility] assistance for placement/SNF   Vital signs stable overnight    Terald Sleeper, MD 07/08/23 [phone]

## 2023-07-09 NOTE — ED Provider Notes (Signed)
  Physical Exam  BP (!) 140/104 (BP Location: Right Arm)   Pulse 76   Temp 98.5 F (36.9 C) (Oral)   Resp 18   Ht 5\' 10"  (1.778 m)   Wt 72.6 kg   SpO2 100%   BMI 22.96 kg/m   Physical Exam  Procedures  Procedures  ED Course / MDM   Clinical Course as of 07/09/23 1059  Sun Jul 03, 2023  2126 CK Total(!): 605 Mildly elevated CK [HN]  2126 CBC with Differential wnl [HN]  2126 Comprehensive metabolic panel(!) Unremarkable in the context of this patient's presentation  [HN]  2145 CT Head Wo Contrast 1. No evidence of acute intracranial abnormality. 2. Remote infarcts and chronic microvascular ischemic disease.   [HN]  2210 Urinalysis, Routine w reflex microscopic -Urine, Clean Catch(!) UA with small leukocytes, some RBCs, >50 whites, but no bacteria and 6-10 squams. Will get culture. No leukocytosis or fever, unlikely a UTI. [HN]  2246 Resp panel by RT-PCR (RSV, Flu A&B, Covid) Anterior Nasal Swab neg [HN]  2307 BSRN discussed with brother over the phone. Brother is out of town but is really concerned about him going back to hotel alone. he is working with a caseworker now to try and get him placed somewhere, but has had to live in the hotel in the meantime [HN]  2312 D/w patient. He doesn't feel like he is able to get up and walk without two people to assist him. His only option for discharge is alone to a hotel room where he has basically laid on the ground versus in bed for the last >24 hours. Do not believe patient is a safe dispo. He does not have anyone else to help him. Will consult to social work and physical therapy for assessment.  [HN]  2326 Home meds and diet ordered [HN]    Clinical Course User Index [HN] Loetta Rough, MD   Medical Decision Making Amount and/or Complexity of Data Reviewed Labs: ordered. Decision-making details documented in ED Course. Radiology: ordered. Decision-making details documented in ED Course.  Risk OTC drugs. Prescription drug  management.   Pending potential skilled nursing placement, however appears to be some difficulty since patient is homeless.       Benjiman Core, MD 07/09/23 1100

## 2023-07-10 NOTE — ED Provider Notes (Signed)
 Emergency Medicine Observation Re-evaluation Note  Kenneth Morales is a 58 y.o. male.  Pt initially presented to the ED for complaints of Fall Currently, the patient is resting.  Physical Exam  BP (!) 168/92   Pulse 64   Temp 98.3 F (36.8 C) (Oral)   Resp 14   Ht 5\' 10"  (1.778 m)   Wt 72.6 kg   SpO2 97%   BMI 22.96 kg/m  Physical Exam General: NAD Cardiac: Regular HR Lungs: No respiratory distress  ED Course / MDM  EKG:EKG Interpretation Date/Time:  Sunday July 03 2023 21:28:31 EDT Ventricular Rate:  74 PR Interval:  138 QRS Duration:  100 QT Interval:  388 QTC Calculation: 430 R Axis:   -83  Text Interpretation: Normal sinus rhythm Left anterior fascicular block Minimal voltage criteria for LVH, may be normal variant ( Cornell product ) TWAs similar to prior EKGs Confirmed by Vivi Barrack [phone]) on 07/03/2023 10:48:27 PM  I have reviewed the labs performed to date as well as medications administered while in observation.    Plan  Current plan is for [facility] assistance with placement.    Terald Sleeper, MD 07/10/23 [phone]

## 2023-07-11 NOTE — ED Provider Notes (Signed)
 Emergency Medicine Observation Re-evaluation Note  Kenneth Morales is a 58 y.o. male, seen on rounds today.  Pt initially presented to the ED for complaints of Fall Currently, the patient is resting still awaiting TOC placement.  Physical Exam  BP (!) 115/99 (BP Location: Left Arm)   Pulse 76   Temp 98.5 F (36.9 C) (Oral)   Resp 16   Ht 1.778 m (5\' 10" )   Wt 72.6 kg   SpO2 99%   BMI 22.96 kg/m  Physical Exam General: Nontoxic no acute distress Cardiac:  Lungs: No respiratory distress Psych: Baseline  ED Course / MDM  EKG:EKG Interpretation Date/Time:  Sunday July 03 2023 21:28:31 EDT Ventricular Rate:  74 PR Interval:  138 QRS Duration:  100 QT Interval:  388 QTC Calculation: 430 R Axis:   -83  Text Interpretation: Normal sinus rhythm Left anterior fascicular block Minimal voltage criteria for LVH, may be normal variant ( Cornell product ) TWAs similar to prior EKGs Confirmed by Vivi Barrack [phone]) on 07/03/2023 10:48:27 PM  I have reviewed the labs performed to date as well as medications administered while in observation.  Recent changes in the last 24 hours include no significant change in.  Plan  Current plan is for TOC placement.    Vanetta Mulders, MD 07/11/23 [phone]

## 2023-07-11 NOTE — Progress Notes (Signed)
 PT Cancellation Note  Patient Details Name: Kenneth Morales MRN: [id number] DOB: 07-Oct-1965   Cancelled Treatment:    Reason Eval/Treat Not Completed: Other (comment) (Declined adamantly as he states he got no sleep last night.)   Bevelyn Buckles 07/11/2023, 12:25 PM Edlyn Rosenburg M,PT Acute Rehab Services [phone]

## 2023-07-12 NOTE — Progress Notes (Addendum)
 Pt has no bed offers at this time. Per chart review, pt walked 80 feet, mod assist with +2 safety equipment Adult nurse).   Addend @ 1:38PM CSW followed up with [facility] who has declined and [facility] who does not have Medicaid beds.   Addend @ 2:52PM Belenda Cruise with [facility] reports they have no LTC beds available.

## 2023-07-12 NOTE — ED Provider Notes (Signed)
 Emergency Medicine Observation Re-evaluation Note  Kenneth Morales is a 58 y.o. male, seen on rounds today.  Pt initially presented to the ED for complaints of Fall Currently, the patient is resting in bed asking for a cheeseburger.  Physical Exam  BP (!) 144/85   Pulse 78   Temp 98.5 F (36.9 C) (Oral)   Resp 18   Ht 5\' 10"  (1.778 m)   Wt 72.6 kg   SpO2 98%   BMI 22.96 kg/m  Physical Exam General: Resting without acute distress Cardiac: Not tachycardic on vitals Lungs: Breath sounds equal and symmetric on exam Psych: No agitation at this time  ED Course / MDM  EKG:EKG Interpretation Date/Time:  Sunday July 03 2023 21:28:31 EDT Ventricular Rate:  74 PR Interval:  138 QRS Duration:  100 QT Interval:  388 QTC Calculation: 430 R Axis:   -83  Text Interpretation: Normal sinus rhythm Left anterior fascicular block Minimal voltage criteria for LVH, may be normal variant ( Cornell product ) TWAs similar to prior EKGs Confirmed by Vivi Barrack [phone]) on 07/03/2023 10:48:27 PM  I have reviewed the labs performed to date as well as medications administered while in observation.  Recent changes in the last 24 hours include none reported by overnight nursing.  Plan  Current plan is for awaiting placement.    Leighann Amadon, Canary Brim, MD 07/12/23 [phone]

## 2023-07-12 NOTE — ED Notes (Signed)
 Pt at bedside

## 2023-07-12 NOTE — Progress Notes (Signed)
 Physical Therapy Treatment Patient Details Name: Kenneth Morales MRN: [id number] DOB: 02-08-1966 Today's Date: 07/12/2023   History of Present Illness 58 y.o. male presents to [facility] hospital on 07/03/2023 after a fall. PMH includes MI, CAD, cocaine abuse, depression, HLD, HTN, CVA.    PT Comments  Pt admitted with above diagnosis. Pt was able to ambulate with RW and progress distance today but still needs +2 mod assist for safety as he has poor balance and poor postural stability overall.  Will continue to follow acutely.  Pt currently with functional limitations due to the deficits listed below (see PT Problem List). Pt will benefit from acute skilled PT to increase their independence and safety with mobility to allow discharge.       If plan is discharge home, recommend the following: A little help with walking and/or transfers;A lot of help with bathing/dressing/bathroom;Assistance with cooking/housework;Help with stairs or ramp for entrance   Can travel by private vehicle     No  Equipment Recommendations   (TBD pending further assessment)    Recommendations for Other Services       Precautions / Restrictions Precautions Precautions: Fall Recall of Precautions/Restrictions: Impaired Restrictions Weight Bearing Restrictions Per Provider Order: No     Mobility  Bed Mobility Overal bed mobility: Needs Assistance Bed Mobility: Rolling, Supine to Sit, Sit to Supine Rolling: Contact guard assist, Used rails   Supine to sit: Min assist Sit to supine: Min assist   General bed mobility comments: pt pulling on PT to come to long sitting, then able to complete scooting with repeated cues, increased time. minA to return to bed    Transfers Overall transfer level: Needs assistance Equipment used: Rolling walker (2 wheels) Transfers: Sit to/from Stand, Bed to chair/wheelchair/BSC Sit to Stand: Min assist, From elevated surface (ED stretcher)           General transfer comment: pt  able to rise from ED stretcher with minA to maintain RW and for safety.    Ambulation/Gait Ambulation/Gait assistance: +2 safety/equipment, Mod assist Gait Distance (Feet): 80 Feet Assistive device: Rolling walker (2 wheels) Gait Pattern/deviations: Step-through pattern, Decreased stride length, Decreased dorsiflexion - right, Narrow base of support Gait velocity: decreased Gait velocity interpretation: <1.31 ft/sec, indicative of household ambulator   General Gait Details: slowed pace poor R hip flexion and clearance of R toes needing incr cues and assist.  Pt with unequal step length needing incr cues to sequence steps and RW each step. Also had to slow pt down as he wanted to push RW too far in front of him. Fatigues and right LE fatigues making balance more poor.   Stairs             Wheelchair Mobility     Tilt Bed    Modified Rankin (Stroke Patients Only) Modified Rankin (Stroke Patients Only) Pre-Morbid Rankin Score: Moderate disability Modified Rankin: Moderately severe disability     Balance Overall balance assessment: Needs assistance Sitting-balance support: Feet supported, No upper extremity supported Sitting balance-Leahy Scale: Fair     Standing balance support: Bilateral upper extremity supported, During functional activity Standing balance-Leahy Scale: Poor Standing balance comment: relying on RW for UE support                            Communication Communication Communication: No apparent difficulties  Cognition Arousal: Alert Behavior During Therapy: Impulsive   PT - Cognitive impairments: Awareness, Attention, Problem solving  PT - Cognition Comments: pt with poor multi-step command following, poor insight to sfaety, impulsive at times Following commands: Impaired Following commands impaired: Follows multi-step commands inconsistently    Cueing Cueing Techniques: Verbal cues, Visual cues, Gestural  cues  Exercises General Exercises - Lower Extremity Ankle Circles/Pumps: AROM, Both, 10 reps, Seated Long Arc Quad: AROM, Both, 10 reps, Seated    General Comments General comments (skin integrity, edema, etc.): VSS on RA      Pertinent Vitals/Pain Pain Assessment Pain Assessment: Faces Faces Pain Scale: Hurts little more Pain Location: generalized Pain Descriptors / Indicators: Sore Pain Intervention(s): Limited activity within patient's tolerance, Monitored during session, Repositioned    Home Living                          Prior Function            PT Goals (current goals can now be found in the care plan section) Progress towards PT goals: Progressing toward goals    Frequency    Min 2X/week      PT Plan      Co-evaluation              AM-PAC PT "6 Clicks" Mobility   Outcome Measure  Help needed turning from your back to your side while in a flat bed without using bedrails?: A Little Help needed moving from lying on your back to sitting on the side of a flat bed without using bedrails?: A Little Help needed moving to and from a bed to a chair (including a wheelchair)?: A Lot Help needed standing up from a chair using your arms (e.g., wheelchair or bedside chair)?: A Lot Help needed to walk in hospital room?: A Lot Help needed climbing 3-5 steps with a railing? : Total 6 Click Score: 13    End of Session Equipment Utilized During Treatment: Gait belt Activity Tolerance: Patient limited by fatigue Patient left: with call bell/phone within reach;with bed alarm set (pt on stretcher) Nurse Communication: Mobility status PT Visit Diagnosis: Other abnormalities of gait and mobility (R26.89);Muscle weakness (generalized) (M62.81)     Time: 2440-1027 PT Time Calculation (min) (ACUTE ONLY): 25 min  Charges:    $Gait Training: 23-37 mins PT General Charges $$ ACUTE PT VISIT: 1 Visit                     Adithya Difrancesco M,PT Acute Rehab  Services [phone]    Bevelyn Buckles 07/12/2023, 12:39 PM

## 2023-07-12 NOTE — ED Notes (Signed)
 Pt spent 1 hr in recliner. Moved to new hospital bed. Linens and pad changed.

## 2023-07-13 NOTE — ED Notes (Signed)
 Assumed pt care from [facility]

## 2023-07-13 NOTE — Progress Notes (Addendum)
 1pm: CSW spoke with Tammy who states none of the facilities she manages will be able to accept patient even with an LOG.  CSW spoke with [facility] supervisor to inform her of information.   Patient is unable to be discharged to the shelter at this time.  CSW will continue efforts to locate a facility that may be able to accept patient.   CSW attempted to reach Tobi Bastos, Sports coach at [employer] without success - a voicemail was left requesting a return call.  11:40am: CSW spoke with [facility] supervisor regarding LOG - LOG will be approved for 30 days and can be renewed.  CSW spoke with Geanie Berlin to discuss LOG information. Tammy will review to determine if any facility can accept patient.  Edwin Dada, MSW, LCSW Transitions of Care  Clinical Social Worker II [phone]

## 2023-07-14 NOTE — Progress Notes (Signed)
 CSW spoke with Lajean Manes at [facility] to discuss patient. Kenney Houseman states the facility is not accepting patient's for LTC at this time.  Edwin Dada, MSW, LCSW Transitions of Care  Clinical Social Worker II [phone]

## 2023-07-14 NOTE — Progress Notes (Signed)
 Physical Therapy Treatment Patient Details Name: Kenneth Morales MRN: [id number] DOB: [birth year] Today's Date: 07/14/2023   History of Present Illness 58 y.o. male presents to [facility] hospital on 07/03/2023 after a fall. PMH includes MI, CAD, cocaine abuse, depression, HLD, HTN, CVA.    PT Comments  Slow progress towards goals. Pt was fatigued today; Required assistance with RLE for side stepping. Very low clearance and difficulty placing the RLE during gait. Pt Min A for bed mobiltiy, sit to stand and gait requiring 2 person for chair follow due to poor balance and fatigue. Due to pt current functional status, home set up and available assistance at home recommending skilled physical therapy services < 3 hours/day in order to address strength, balance and functional mobility to decrease risk for falls, injury, immobility, skin break down and re-hospitalization.      If plan is discharge home, recommend the following: A little help with walking and/or transfers;A lot of help with bathing/dressing/bathroom;Assistance with cooking/housework;Help with stairs or ramp for entrance   Can travel by private vehicle     No  Equipment Recommendations  Wheelchair (measurements PT);Wheelchair cushion (measurements PT);BSC/3in1       Precautions / Restrictions Precautions Precautions: Fall Recall of Precautions/Restrictions: Impaired Restrictions Weight Bearing Restrictions Per Provider Order: No     Mobility  Bed Mobility Overal bed mobility: Needs Assistance Bed Mobility: Rolling, Supine to Sit, Sit to Supine Rolling: Contact guard assist, Used rails   Supine to sit: Min assist Sit to supine: Min assist   General bed mobility comments: Verbal cues for sequencing and increased time to perform activity    Transfers Overall transfer level: Needs assistance Equipment used: Rolling walker (2 wheels) Transfers: Sit to/from Stand Sit to Stand: Min assist, +2 safety/equipment           General  transfer comment: Min A  +2 for safety    Ambulation/Gait Ambulation/Gait assistance: +2 safety/equipment, Min assist Gait Distance (Feet): 10 Feet Assistive device: Rolling walker (2 wheels) Gait Pattern/deviations: Step-through pattern, Decreased stride length, Decreased dorsiflexion - right, Narrow base of support Gait velocity: decreased Gait velocity interpretation: <1.31 ft/sec, indicative of household ambulator   General Gait Details: slowed pace poor R hip flexion and clearance of R toes needing incr cues and assist.  Pt with unequal step length needing incr cues to sequence steps and RW each step. Pt fatigued quickly and wanted to sit.     Modified Rankin (Stroke Patients Only) Modified Rankin (Stroke Patients Only) Pre-Morbid Rankin Score: Moderate disability Modified Rankin: Moderately severe disability     Balance Overall balance assessment: Needs assistance Sitting-balance support: Feet supported, No upper extremity supported Sitting balance-Leahy Scale: Fair     Standing balance support: Bilateral upper extremity supported, During functional activity Standing balance-Leahy Scale: Poor Standing balance comment: relying on RW for UE support      Communication Communication Communication: No apparent difficulties  Cognition Arousal: Alert Behavior During Therapy: WFL for tasks assessed/performed   PT - Cognitive impairments: No apparent impairments     Following commands: Intact Following commands impaired: Only follows one step commands consistently    Cueing Cueing Techniques: Verbal cues     General Comments General comments (skin integrity, edema, etc.): No signs/symptoms of cardiac/respiratory distress throughout session.      Pertinent Vitals/Pain Pain Assessment Pain Assessment: Faces Faces Pain Scale: Hurts even more Pain Location: R hand with any type of touch Pain Descriptors / Indicators: Sore, Grimacing, Guarding Pain Intervention(s):  Monitored during  session     PT Goals (current goals can now be found in the care plan section) Acute Rehab PT Goals Patient Stated Goal: to stop falling PT Goal Formulation: With patient Potential to Achieve Goals: Fair Progress towards PT goals: Progressing toward goals    Frequency    Min 2X/week      PT Plan  Continue with current POC        AM-PAC PT "6 Clicks" Mobility   Outcome Measure  Help needed turning from your back to your side while in a flat bed without using bedrails?: A Little Help needed moving from lying on your back to sitting on the side of a flat bed without using bedrails?: A Little Help needed moving to and from a bed to a chair (including a wheelchair)?: A Little Help needed standing up from a chair using your arms (e.g., wheelchair or bedside chair)?: A Little Help needed to walk in hospital room?: A Lot Help needed climbing 3-5 steps with a railing? : Total 6 Click Score: 15    End of Session Equipment Utilized During Treatment: Gait belt Activity Tolerance: Patient limited by pain;Patient tolerated treatment well Patient left: with call bell/phone within reach;with bed alarm set;in bed Nurse Communication: Mobility status PT Visit Diagnosis: Other abnormalities of gait and mobility (R26.89);Muscle weakness (generalized) (M62.81)     Time: 1437-1500 PT Time Calculation (min) (ACUTE ONLY): 23 min  Charges:    $Gait Training: 8-22 mins $Therapeutic Activity: 8-22 mins PT General Charges $$ ACUTE PT VISIT: 1 Visit                    Kenneth Morales, DPT, CLT  Acute Rehabilitation Services Office: [phone] (Secure chat preferred)    Claudia Desanctis 07/14/2023, 4:01 PM

## 2023-07-14 NOTE — ED Notes (Signed)
 Pt given peanut butter and graham crackers and provided with a warm blanket. Pt is now going to go to sleep, call bell within reach.

## 2023-07-14 NOTE — ED Provider Notes (Signed)
 Emergency Medicine Observation Re-evaluation Note  JAQUEL GLASSBURN is a 58 y.o. male, seen on rounds today.  Pt initially presented to the ED for complaints of Fall Currently, the patient is sitting in bed resting without acute distress once again asking for cheeseburger.  Physical Exam  BP 131/79   Pulse 63   Temp 97.9 F (36.6 C) (Oral)   Resp 15   Ht 5\' 10"  (1.778 m)   Wt 72.6 kg   SpO2 99%   BMI 22.96 kg/m  Physical Exam General: Resting without agitation Cardiac: No murmur on my exam Lungs: Clear breath sounds bilaterally Psych: No agitation  ED Course / MDM  EKG:EKG Interpretation Date/Time:  Sunday July 03 2023 21:28:31 EDT Ventricular Rate:  74 PR Interval:  138 QRS Duration:  100 QT Interval:  388 QTC Calculation: 430 R Axis:   -83  Text Interpretation: Normal sinus rhythm Left anterior fascicular block Minimal voltage criteria for LVH, may be normal variant ( Cornell product ) TWAs similar to prior EKGs Confirmed by Vivi Barrack [phone]) on 07/03/2023 10:48:27 PM  I have reviewed the labs performed to date as well as medications administered while in observation.  Recent changes in the last 24 hours include none reported by overnight nursing.  Plan  Current plan is for awaiting placement.    Honesti Seaberg, Canary Brim, MD 07/14/23 [phone]

## 2023-07-15 NOTE — ED Provider Notes (Signed)
 Emergency Medicine Observation Re-evaluation Note  JURGEN GROENEVELD is a 58 y.o. male, seen on rounds today.  Pt initially presented to the ED for complaints of Fall Currently, the patient is asleep.  Physical Exam  BP (!) 155/104   Pulse 85   Temp 98.5 F (36.9 C)   Resp 18   Ht 5\' 10"  (1.778 m)   Wt 72.6 kg   SpO2 96%   BMI 22.96 kg/m  Physical Exam General: asleep Cardiac: asleep Lungs: asleep Psych: asleep  ED Course / MDM  EKG:EKG Interpretation Date/Time:  Sunday July 03 2023 21:28:31 EDT Ventricular Rate:  74 PR Interval:  138 QRS Duration:  100 QT Interval:  388 QTC Calculation: 430 R Axis:   -83  Text Interpretation: Normal sinus rhythm Left anterior fascicular block Minimal voltage criteria for LVH, may be normal variant ( Cornell product ) TWAs similar to prior EKGs Confirmed by Vivi Barrack [phone]) on 07/03/2023 10:48:27 PM  I have reviewed the labs performed to date as well as medications administered while in observation.  No recent changes in the last 24 hours.  Plan  Current plan is for placement.    Pricilla Loveless, MD 07/15/23 [phone]

## 2023-07-15 NOTE — Progress Notes (Addendum)
 CSW attempted to reach Diane at [facility] without success - a voicemail was left requesting a return call.  CSW spoke with [place] at [facility] in [place] who states the facility does not accept patient's insurance.  CSW attempted to reach admissions at [facility] without success - no voicemail option available.  CSW spoke with Marylene Land of [facility] who states the facilities in [place] and [facility] can accept patient's insurance however both buildings are full right now.  CSW spoke with Velna Hatchet at [facility] who states the facility is unable to offer patient a bed as his insurance plan does not have SNF benefits.  Edwin Dada, MSW, LCSW Transitions of Care  Clinical Social Worker II [phone]

## 2023-07-15 NOTE — ED Notes (Signed)
 Please call brother

## 2023-07-16 NOTE — ED Notes (Signed)
 Full bed bath and linen change provided.  Oral care performed, pt assisted RN by rolling in bed, performed own oral care.

## 2023-07-16 NOTE — ED Notes (Signed)
 This RN to patient bedside and found him standing naked stating "I need to be washed down." Pt provided a gown and informed him he was capable of washing himself. Pt provided with wash basin, soap, and washcloths. Bed side table setup for patient. Pt performed his ADL's with minimal assistance.

## 2023-07-16 NOTE — ED Provider Notes (Signed)
 Emergency Medicine Observation Re-evaluation Note  Kenneth Morales is a 58 y.o. male, seen on rounds today.  Pt initially presented to the ED for complaints of Fall Currently, the patient is awaiting placement.  Physical Exam  BP (!) 141/77 (BP Location: Left Arm)   Pulse 71   Temp 98.3 F (36.8 C) (Oral)   Resp 16   Ht 5\' 10"  (1.778 m)   Wt 72.6 kg   SpO2 100%   BMI 22.96 kg/m  Physical Exam General: Calm Cardiac: Well perfused Lungs: Even respirations Psych: Calm  ED Course / MDM  EKG:EKG Interpretation Date/Time:  Sunday July 03 2023 21:28:31 EDT Ventricular Rate:  74 PR Interval:  138 QRS Duration:  100 QT Interval:  388 QTC Calculation: 430 R Axis:   -83  Text Interpretation: Normal sinus rhythm Left anterior fascicular block Minimal voltage criteria for LVH, may be normal variant ( Cornell product ) TWAs similar to prior EKGs Confirmed by Vivi Barrack [phone]) on 07/03/2023 10:48:27 PM  I have reviewed the labs performed to date as well as medications administered while in observation.  Recent changes in the last 24 hours include continuing to look for SNF placement.  Plan  Current plan is for SNF placement.    Maia Plan, MD 07/16/23 [phone]

## 2023-07-17 NOTE — ED Provider Notes (Signed)
 Emergency Medicine Observation Re-evaluation Note  Kenneth Morales is a 58 y.o. male, seen on rounds today.  Pt initially presented to the ED for complaints of Fall Currently, the patient is awaiting SNF placement.  Physical Exam  BP 137/79 (BP Location: Left Arm)   Pulse 74   Temp 99 F (37.2 C) (Oral)   Resp 16   Ht 5\' 10"  (1.778 m)   Wt 72.6 kg   SpO2 99%   BMI 22.96 kg/m  Physical Exam General: Calm Cardiac: Well perfused Lungs: Even respirations Psych: Calm  ED Course / MDM  EKG:EKG Interpretation Date/Time:  Sunday July 03 2023 21:28:31 EDT Ventricular Rate:  74 PR Interval:  138 QRS Duration:  100 QT Interval:  388 QTC Calculation: 430 R Axis:   -83  Text Interpretation: Normal sinus rhythm Left anterior fascicular block Minimal voltage criteria for LVH, may be normal variant ( Cornell product ) TWAs similar to prior EKGs Confirmed by Vivi Barrack [phone]) on 07/03/2023 10:48:27 PM  I have reviewed the labs performed to date as well as medications administered while in observation.  Recent changes in the last 24 hours include N/A.  Plan  Current plan is for placement.    Maia Plan, MD 07/17/23 [phone]

## 2023-07-17 NOTE — ED Notes (Signed)
 Warm blanket and lemon lime soda provided per pt request. RR even and unlabored. Deny any other needs at this time. Call light within reach.

## 2023-07-17 NOTE — ED Notes (Signed)
 Pt declined for dinner tray to be reheated. Provided juice, graham crackers and peanut butter per request. Additional warm blankets given. RR even and nonlabored. Call light within reach.

## 2023-07-18 NOTE — ED Provider Notes (Signed)
 Emergency Medicine Observation Re-evaluation Note  Kenneth Morales is a 58 y.o. male, seen on rounds today.  Pt initially presented to the ED for complaints of Fall Currently, the patient is sleeping.  Physical Exam  BP (!) 167/91 (BP Location: Left Arm)   Pulse 70   Temp 99 F (37.2 C) (Oral)   Resp 16   Ht 5\' 10"  (1.778 m)   Wt 72.6 kg   SpO2 96%   BMI 22.96 kg/m  Physical Exam General: NAD Lungs: No respiratory distress  ED Course / MDM  EKG:EKG Interpretation Date/Time:  Sunday July 03 2023 21:28:31 EDT Ventricular Rate:  74 PR Interval:  138 QRS Duration:  100 QT Interval:  388 QTC Calculation: 430 R Axis:   -83  Text Interpretation: Normal sinus rhythm Left anterior fascicular block Minimal voltage criteria for LVH, may be normal variant ( Cornell product ) TWAs similar to prior EKGs Confirmed by Vivi Barrack [phone]) on 07/03/2023 10:48:27 PM  I have reviewed the labs performed to date as well as medications administered while in observation.  Recent changes in the last 24 hours include no acute events.  Plan  Current plan is for placement.    Durwin Glaze, MD 07/18/23 [phone]

## 2023-07-18 NOTE — Progress Notes (Signed)
 There are no discharge planning updates available at this time.  Edwin Dada, MSW, LCSW Transitions of Care  Clinical Social Worker II [phone]

## 2023-07-19 ENCOUNTER — Other Ambulatory Visit: Payer: Self-pay

## 2023-07-19 NOTE — Evaluation (Signed)
 Physical Therapy Re-Evaluation Patient Details Name: Kenneth Morales MRN: [id number] DOB: 06-10-1965 Today's Date: 07/19/2023  History of Present Illness  58 y.o. male presents to [facility] hospital on 07/03/2023 after a fall. PMH includes MI, CAD, cocaine abuse, depression, HLD, HTN, CVA.  Clinical Impression  Patient seen for reassessment and noted MD concern for placement.  Patient able to ambulate with RW and CGA to min A in hallway limited by fatigue and R LE dragging more with fatigue.  Patient on BSC in front of sink for bathing, shaving, brushing teeth with set up assist, cues for sequencing and A for balance with standing.  Patient reports not eating his meals due to difficulty with utensils and requesting hand held food and RN made aware.  Well fatigued with activity today and super high risk for falls with ambulation due to R LE fatigue.  Patient requesting wheelchair at d/c and could look into mobility with w/c for safety.  Feel he is best suited for inpatient rehab but no specific post-rehab plan in place as pt has no family he can stay with and no home to return to.  Feel he will benefit from continued skilled PT in the acute setting.  Social work assistance appreciated to help with disposition.         If plan is discharge home, recommend the following: A little help with walking and/or transfers;Direct supervision/assist for medications management;Help with stairs or ramp for entrance;Assist for transportation;Assistance with cooking/housework   Can travel by Doctor, hospital (measurements PT);Wheelchair cushion (measurements PT);BSC/3in1  Recommendations for Other Services       Functional Status Assessment Patient has had a recent decline in their functional status and demonstrates the ability to make significant improvements in function in a reasonable and predictable amount of time.     Precautions / Restrictions Precautions Precautions:  Fall Precaution/Restrictions Comments: R hemiparesis      Mobility  Bed Mobility Overal bed mobility: Needs Assistance Bed Mobility: Supine to Sit, Sit to Supine     Supine to sit: Used rails, HOB elevated, Supervision Sit to supine: Contact guard assist   General bed mobility comments: using bed features and increased time to come to EOB, assist to supine for positioning legs with cues    Transfers Overall transfer level: Needs assistance Equipment used: Rolling walker (2 wheels)   Sit to Stand: Contact guard assist           General transfer comment: assist for balance, cues for hand placement    Ambulation/Gait Ambulation/Gait assistance: Min assist, Contact guard assist Gait Distance (Feet): 40 Feet (x 2 with seated rest) Assistive device: Rolling walker (2 wheels) Gait Pattern/deviations: Step-to pattern, Decreased stride length, Trunk flexed, Decreased dorsiflexion - right, Decreased step length - right       General Gait Details: over time R leg dragging further behind with cues for stopping and bringing leg into walker, more flexed over time as well with stops for correcting posture prior to continuing.  Min A given at times when R leg too far behind and super high fall risk, CGA to S most of ambulation  Stairs            Wheelchair Mobility     Tilt Bed    Modified Rankin (Stroke Patients Only)       Balance Overall balance assessment: Needs assistance   Sitting balance-Leahy Scale: Good Sitting balance - Comments: sitting on BSC  in front of sink to complete bathing tasks with distant S and cues for sequencing, CGA during standing one hand on sink for perineal hygiene     Standing balance-Leahy Scale: Poor Standing balance comment: UE support for balance                             Pertinent Vitals/Pain Pain Assessment Pain Assessment: Faces Faces Pain Scale: Hurts a little bit Pain Location: R hand Pain Descriptors /  Indicators: Sore Pain Intervention(s): Monitored during session    Home Living Family/patient expects to be discharged to:: Other (Comment) (hotel in [place]) Living Arrangements: Alone Available Help at Discharge: Other (Comment) Type of Home: Other(Comment) (hotel)           Home Equipment: Gilmer Mor - single point Additional Comments: states his brother lives in [place], was trying to get to him but he states brother not helping, states his daughter lived with him, but she got grown and left and he was happy since she was aggravating him. States sister and father recently passed.  No other family identified at this time.    Prior Function Prior Level of Function : History of Falls (last six months)             Mobility Comments: pt reports 2 recent falls, has been ambulating without DME; reports laid on the floor 10 hours in the hotel ADLs Comments: difficulty with ADL's and microwaves meals     Extremity/Trunk Assessment   Upper Extremity Assessment Upper Extremity Assessment: Right hand dominant;RUE deficits/detail;LUE deficits/detail RUE Deficits / Details: using for bathing/shaving though with difficulty and noted some swelling, reports soreness still from fall; states not eating the food since difficult to use utensils, requesting hand held food LUE Deficits / Details: AROM WFL, limited functional use only helping turn shaver with that hand, not using it for shaving; used it for washing bottom in standing using both hands    Lower Extremity Assessment Lower Extremity Assessment: RLE deficits/detail RLE Deficits / Details: decerased hip flexion in standing and limited ankle DF, though not formally tested RLE Coordination: decreased gross motor LLE Deficits / Details: grossly WFL    Cervical / Trunk Assessment Cervical / Trunk Assessment: Other exceptions Cervical / Trunk Exceptions: flexed posture in standing  Communication   Communication Communication: No  apparent difficulties    Cognition Arousal: Alert Behavior During Therapy: WFL for tasks assessed/performed   PT - Cognitive impairments: Sequencing                       PT - Cognition Comments: cues for sequencing and completing all parts of bathing in easiest order for postion (sitting vs standing)         Cueing       General Comments General comments (skin integrity, edema, etc.): VSS though fatigued with ambulation and ADL's; seated at sink for brushing teeth, bathing and shaving (set up for teeth brusing, set up and cues for upper body bathing, CGA for lower body and min A for thoroughness with shaiving)    Exercises     Assessment/Plan    PT Assessment Patient needs continued PT services  PT Problem List Decreased strength;Decreased activity tolerance;Decreased balance;Decreased mobility;Decreased knowledge of use of DME;Decreased safety awareness;Decreased knowledge of precautions       PT Treatment Interventions DME instruction;Therapeutic exercise;Gait training;Balance training;Wheelchair mobility training;Therapeutic activities;Patient/family education;Functional mobility training    PT Goals (Current  goals can be found in the Care Plan section)  Acute Rehab PT Goals Patient Stated Goal: to get to rehab PT Goal Formulation: With patient Time For Goal Achievement: 08/02/23 Potential to Achieve Goals: Good    Frequency Min 3X/week     Co-evaluation               AM-PAC PT "6 Clicks" Mobility  Outcome Measure Help needed turning from your back to your side while in a flat bed without using bedrails?: A Little Help needed moving from lying on your back to sitting on the side of a flat bed without using bedrails?: A Little Help needed moving to and from a bed to a chair (including a wheelchair)?: A Little Help needed standing up from a chair using your arms (e.g., wheelchair or bedside chair)?: A Little Help needed to walk in hospital room?: A  Little Help needed climbing 3-5 steps with a railing? : Total 6 Click Score: 16    End of Session Equipment Utilized During Treatment: Gait belt Activity Tolerance: Patient limited by fatigue Patient left: in bed;with call bell/phone within reach   PT Visit Diagnosis: Other abnormalities of gait and mobility (R26.89);Muscle weakness (generalized) (M62.81)    Time: 1027-2536 PT Time Calculation (min) (ACUTE ONLY): 47 min   Charges:   PT Evaluation $PT Re-evaluation: 1 Re-eval PT Treatments $Gait Training: 8-22 mins $Therapeutic Activity: 8-22 mins PT General Charges $$ ACUTE PT VISIT: 1 Visit         Sheran Lawless, PT Acute Rehabilitation Services Office:[phone] 07/19/2023   Kenneth Morales 07/19/2023, 3:07 PM

## 2023-07-19 NOTE — ED Provider Notes (Signed)
 Emergency Medicine Observation Re-evaluation Note  Kenneth Morales is a 58 y.o. male, seen on rounds today.  Pt initially presented to the ED for complaints of Fall Currently, the patient is resting comfortably no acute distress.  Physical Exam  BP 130/73 (BP Location: Left Arm)   Pulse 72   Temp 98.3 F (36.8 C) (Oral)   Resp 12   Ht 1.778 m (5\' 10" )   Wt 72.6 kg   SpO2 100%   BMI 22.96 kg/m  Physical Exam   ED Course / MDM  EKG:EKG Interpretation Date/Time:  Sunday July 03 2023 21:28:31 EDT Ventricular Rate:  74 PR Interval:  138 QRS Duration:  100 QT Interval:  388 QTC Calculation: 430 R Axis:   -83  Text Interpretation: Normal sinus rhythm Left anterior fascicular block Minimal voltage criteria for LVH, may be normal variant ( Cornell product ) TWAs similar to prior EKGs Confirmed by Vivi Barrack [phone]) on 07/03/2023 10:48:27 PM  I have reviewed the labs performed to date as well as medications administered while in observation.  Recent changes in the last 24 hours include placement.  Plan  Current plan is for placement.    Lorre Nick, MD 07/19/23 [id number]

## 2023-07-19 NOTE — ED Provider Notes (Signed)
 Chart reviewed - unfortunately it appears very little progress is being made towards either shorter term rehab placement or longer term SNF placement.  It would seem plan of boarding patient in ED setting for weeks/months waiting for disability to be approved and/or other payor source to materialize it not a reasonable option for patient (or health system).  As patient does have family in the area, it is not clear why they would not be able to assist patient. Prior substance use issues, including recent +UDS, also appears to be a barrier for patient progressing in a positive direction. Given pts relatively young age, and given adequate motivation (I.e. proactively assisting and giving best effort in accomplishing his ADLs to the best of his ability, doing PT directed activities on his own several times per day in between formal PT sessions, etc), and/or assistance from family and maximizing other outpatient community resources, he may not need permanent SNF care.  For time patient is in ED, if [facility] team and/or PT/OT team feels patient currently needs SNF, then it would seem they should be maximally working with patient during his boarding time. From chart review, no PT/OT notes in chart since 3/20.  Will ask PT/OT to re-engaged with patient during time in ED.  Will also ask TOC leaders to more aggressively pursue dispo plan for patient that does not involve excessive time spent in ED boarding.        Cathren Laine, MD 07/19/23 [phone]

## 2023-07-19 NOTE — Progress Notes (Addendum)
 CSW spoke with Gus Puma, BSW of Population Health to discuss patient.  Edwin Dada, MSW, LCSW Transitions of Care  Clinical Social Worker II [phone]

## 2023-07-19 NOTE — ED Notes (Signed)
 Assumed pt care.

## 2023-07-19 NOTE — Patient Outreach (Addendum)
 Patient is currently inpatient in hospital. BSW spoke with LCSW about placement for patient. BSW will contact [place] DSS social worker to inquire about SNF that could possibly take patient.   Abelino Derrick, MHA [facility]  [facility] Social Worker [phone]

## 2023-07-20 NOTE — ED Notes (Signed)
 RN noticed spaghetti sauce on pts gown. RN asked pt if she could change gown. Pts stated he is tired and to wait.

## 2023-07-20 NOTE — Evaluation (Signed)
 Occupational Therapy Evaluation Patient Details Name: Kenneth Morales MRN: [id number] DOB: [birth year] Today's Date: 07/20/2023   History of Present Illness   58 y.o. male presents to [facility] hospital on 07/03/2023 after a fall. PMH includes MI, CAD, cocaine abuse, depression, HLD, HTN, CVA.     Clinical Impressions Pt currently at min assist level for selfcare tasks sit to stand and functional transfers simulated to the toilet with use of the RW for support.  Vitals stable throughout with O2 sats at 95% or better.  Pt with slower movements on the right side compared to left with increased dragging of the right foot when attempting to clear during mobility with the RW.  Prior to admission pt was living alone per chart and staying in a hotel.  Feel based on current situation that patient will benefit from acute care OT to address current limitations and impairment in order to increase ADL independence.  Based on pt living alone PTA, feel he will need  inpatient follow up therapy, <3 hours/day post acute stay to work on getting to a modified independent level in the near future.         If plan is discharge home, recommend the following:   A little help with walking and/or transfers;A little help with bathing/dressing/bathroom;Assistance with cooking/housework;Assist for transportation;Help with stairs or ramp for entrance     Functional Status Assessment   Patient has had a recent decline in their functional status and demonstrates the ability to make significant improvements in function in a reasonable and predictable amount of time.     Equipment Recommendations   Other (comment) (TBD next venue of care)      Precautions/Restrictions   Precautions Precautions: Fall Precaution/Restrictions Comments: R hemiparesis Restrictions Weight Bearing Restrictions Per Provider Order: No     Mobility Bed Mobility Overal bed mobility: Needs Assistance Bed Mobility: Supine to Sit, Sidelying  to Sit   Sidelying to sit: Min assist   Sit to supine: Contact guard assist   General bed mobility comments: Min assist for sitting up from sidelying on the right side.    Transfers Overall transfer level: Needs assistance Equipment used: Rolling walker (2 wheels) Transfers: Sit to/from Stand Sit to Stand: Min assist     Step pivot transfers: Mod assist     General transfer comment: Min assist for sit to stand from the EOB with min instructional cueing for hand placement.  Increased flexed posture during mobility with walker getting too far in front and noted difficulty advancing the RLE and keeping them from scissoring slightly.      Balance Overall balance assessment: Needs assistance Sitting-balance support: Feet supported, No upper extremity supported Sitting balance-Leahy Scale: Fair Sitting balance - Comments: Close supervision for reaching down to donn his gripper sock on the right foot.     Standing balance-Leahy Scale: Poor Standing balance comment: Pt needs use of the RW for support during mobility                           ADL either performed or assessed with clinical judgement   ADL Overall ADL's : Needs assistance/impaired Eating/Feeding: Set up;Sitting Eating/Feeding Details (indicate cue type and reason): Pt able to use spoon in the right hand and scoop apple sauce to mouth at slower speed than normal. Grooming: Supervision/safety;Sitting   Upper Body Bathing: Supervision/ safety;Sitting   Lower Body Bathing: Minimal assistance;Sit to/from stand   Upper Body Dressing : Minimal assistance;Sitting  Upper Body Dressing Details (indicate cue type and reason): To donn and doff gown like a jacket Lower Body Dressing: Minimal assistance;Sit to/from stand   Toilet Transfer: Minimal assistance;Ambulation;BSC/3in1;Rolling walker (2 wheels) Toilet Transfer Details (indicate cue type and reason): simulated Toileting- Clothing Manipulation and Hygiene:  Minimal assistance;Sit to/from stand Toileting - Clothing Manipulation Details (indicate cue type and reason): simulated     Functional mobility during ADLs: Minimal assistance (ambulation with use of RW) General ADL Comments: Pt with O2 sats at 95% or better on room air.  HR in the 80s.  Pt reports difficulty with self feeding with utensils and prefers food he can pick up with his hands.  Did not note ataxia or significant problems with this during observation.  He was able to slowly scoop up apple sauce with the RUE while holding plastic utensil and bring to mouth X 2 without spillage.  Slower speed noted but no difficulty with keeping it on the spoon.  Discussed possibility of trying foam grips to build up handle but per his report, food "goes all over the place", so doesn't seem to be difficulty holding the spoon.  Will continue to look at in treatment.     Vision Baseline Vision/History: 0 No visual deficits Ability to See in Adequate Light: 0 Adequate Patient Visual Report: No change from baseline Vision Assessment?: No apparent visual deficits     Perception Perception: Within Functional Limits       Praxis Praxis: Impaired Praxis Impairment Details: Ideomotor Praxis-Other Comments: slight difficulty coordinating tying his gown in the front   Pertinent Vitals/Pain Pain Assessment Pain Assessment: No/denies pain     Extremity/Trunk Assessment Upper Extremity Assessment Upper Extremity Assessment: RUE deficits/detail RUE Deficits / Details: Pt with slightly slower coordination and functional use in the RUE compared to the left.  Full isolated functional ROM noted in all joints.  RUE strength 3+/5 throughout with 5/5 on the left.  Decreased FM coordination noted on the left. RUE Sensation: decreased light touch (able to detect bilaterally but states decreased acuity on the right compared to the left.)   Lower Extremity Assessment Lower Extremity Assessment: Defer to PT  evaluation   Cervical / Trunk Assessment Cervical / Trunk Assessment: Other exceptions Cervical / Trunk Exceptions: Maintains flexed trunk and head in standing.   Communication Communication Communication: Impaired Factors Affecting Communication: Reduced clarity of speech   Cognition Arousal: Alert Behavior During Therapy: WFL for tasks assessed/performed Cognition: No family/caregiver present to determine baseline     Awareness: Online awareness impaired, Intellectual awareness intact   Attention impairment (select first level of impairment): Selective attention   OT - Cognition Comments: Pt initially reports having an apartment instead of staying in a hotel where he had the fall, but was unclear until therapist asked "Do you still have the apartment?", which he replied no.  I had an apartment in [place] but don't have one here in [place].  Decreased clarification of answers at times.                 Following commands: Intact Following commands impaired: Only follows one step commands consistently, Follows multi-step commands with increased time     Cueing  General Comments   Cueing Techniques: Verbal cues;Tactile cues              Home Living Family/patient expects to be discharged to:: Other (Comment) (hotel in [place]) Living Arrangements: Alone Available Help at Discharge: Other (Comment) Type of Home: Other(Comment) (hotel)  Home Equipment: Gilmer Mor - single point   Additional Comments: states his brother lives in [place], was trying to get to him but he states brother not helping, states his daughter lived with him, but she got grown and left and he was happy since she was aggravating him. States sister and father recently passed.  No other family identified at this time.      Prior Functioning/Environment Prior Level of Function : History of Falls (last six months)             Mobility Comments: pt reports  2 recent falls, has been ambulating without DME; reports laid on the floor 10 hours in the hotel ADLs Comments: difficulty with ADL's and microwaves meals    OT Problem List: Impaired balance (sitting and/or standing);Decreased safety awareness;Decreased activity tolerance;Decreased knowledge of use of DME or AE;Decreased strength;Decreased coordination;Impaired UE functional use;Impaired sensation   OT Treatment/Interventions: Self-care/ADL training;DME and/or AE instruction;Therapeutic activities;Balance training;Therapeutic exercise;Patient/family education;Neuromuscular education;Energy conservation      OT Goals(Current goals can be found in the care plan section)   Acute Rehab OT Goals Patient Stated Goal: Pt wants to get better but not specific on goals this session OT Goal Formulation: With patient Time For Goal Achievement: 08/03/23 Potential to Achieve Goals: Good   OT Frequency:  Min 1X/week       AM-PAC OT "6 Clicks" Daily Activity     Outcome Measure Help from another person eating meals?: A Little Help from another person taking care of personal grooming?: A Little Help from another person toileting, which includes using toliet, bedpan, or urinal?: A Little Help from another person bathing (including washing, rinsing, drying)?: A Little Help from another person to put on and taking off regular upper body clothing?: A Little Help from another person to put on and taking off regular lower body clothing?: A Little 6 Click Score: 18   End of Session Equipment Utilized During Treatment: Rolling walker (2 wheels) Nurse Communication: Mobility status  Activity Tolerance: Patient tolerated treatment well Patient left: in bed;with call bell/phone within reach;Other (comment);with family/visitor present  OT Visit Diagnosis: Unsteadiness on feet (R26.81);History of falling (Z91.81);Cognitive communication deficit (R41.841);Repeated falls (R29.6);Hemiplegia and  hemiparesis Symptoms and signs involving cognitive functions: Cerebral infarction Hemiplegia - Right/Left: Right Hemiplegia - dominant/non-dominant: Dominant Hemiplegia - caused by: Cerebral infarction                Time: 1610-9604 OT Time Calculation (min): 41 min Charges:  OT General Charges $OT Visit: 1 Visit OT Evaluation $OT Eval Moderate Complexity: 1 Mod OT Treatments $Self Care/Home Management : 23-37 mins  Perrin Maltese, OTR/L Acute Rehabilitation Services  Office [phone] 07/20/2023

## 2023-07-20 NOTE — ED Notes (Signed)
 Ginger ale and a cup of ice was given to patient.

## 2023-07-20 NOTE — ED Provider Notes (Signed)
  Physical Exam  BP 134/70 (BP Location: Right Arm)   Pulse 74   Temp 98.5 F (36.9 C) (Oral)   Resp 18   Ht 5\' 10"  (1.778 m)   Wt 72.6 kg   SpO2 100%   BMI 22.96 kg/m   Physical Exam  Procedures  Procedures  ED Course / MDM   Clinical Course as of 07/20/23 0742  Wynelle Link Jul 03, 2023  2126 CK Total(!): 605 Mildly elevated CK [HN]  2126 CBC with Differential wnl [HN]  2126 Comprehensive metabolic panel(!) Unremarkable in the context of this patient's presentation  [HN]  2145 CT Head Wo Contrast 1. No evidence of acute intracranial abnormality. 2. Remote infarcts and chronic microvascular ischemic disease.   [HN]  2210 Urinalysis, Routine w reflex microscopic -Urine, Clean Catch(!) UA with small leukocytes, some RBCs, >50 whites, but no bacteria and 6-10 squams. Will get culture. No leukocytosis or fever, unlikely a UTI. [HN]  2246 Resp panel by RT-PCR (RSV, Flu A&B, Covid) Anterior Nasal Swab neg [HN]  2307 BSRN discussed with brother over the phone. Brother is out of town but is really concerned about him going back to hotel alone. he is working with a caseworker now to try and get him placed somewhere, but has had to live in the hotel in the meantime [HN]  2312 D/w patient. He doesn't feel like he is able to get up and walk without two people to assist him. His only option for discharge is alone to a hotel room where he has basically laid on the ground versus in bed for the last >24 hours. Do not believe patient is a safe dispo. He does not have anyone else to help him. Will consult to social work and physical therapy for assessment.  [HN]  2326 Home meds and diet ordered [HN]    Clinical Course User Index [HN] Loetta Rough, MD   Medical Decision Making Amount and/or Complexity of Data Reviewed Labs: ordered. Decision-making details documented in ED Course. Radiology: ordered. Decision-making details documented in ED Course.  Risk OTC drugs. Prescription drug  management.   Patient pending placement.  Has unfortunate been here for almost 400 hours.  Had repeat PT eval yesterday and they are still recommend inpatient treatment.  Social work working on placement.       Benjiman Core, MD 07/20/23 [phone]

## 2023-07-20 NOTE — ED Notes (Signed)
 RN changed pts gown at this time

## 2023-07-21 NOTE — Progress Notes (Signed)
 Physical Therapy Treatment Patient Details Name: Kenneth Morales MRN: [id number] DOB: 06/06/1965 Today's Date: 07/21/2023   History of Present Illness 58 y.o. male presents to [facility] hospital on 07/03/2023 after a fall. PMH includes MI, CAD, cocaine abuse, depression, HLD, HTN, CVA.    PT Comments  Pt resting in bed on arrival and agreeable to session. Pt continues to be limited by R hemi-weakness resulting in poor balance/postural reactions and decreased activity tolerance. Pt requiring min A to complete bed mobility, transfers and to provide steadying assist during gait with RW for support. Pt requiring max cues for sequencing Les during gait as pt with with poor R hip flexion causing R toe drag and increasing risk for falls. Pt able to correct with slower gait cadence on second gait bout however with increased fatigue RLE lags behind needing hands on assist to correct. Patient will benefit from continued inpatient follow up therapy, <3 hours/day to maximize functional interdependence and safety. Will continue to follow acutely.    If plan is discharge home, recommend the following: A little help with walking and/or transfers;Direct supervision/assist for medications management;Help with stairs or ramp for entrance;Assist for transportation;Assistance with cooking/housework   Can travel by private vehicle     No  Equipment Recommendations  Wheelchair (measurements PT);Wheelchair cushion (measurements PT);BSC/3in1    Recommendations for Other Services       Precautions / Restrictions Precautions Precautions: Fall Precaution/Restrictions Comments: R hemiparesis Restrictions Weight Bearing Restrictions Per Provider Order: No     Mobility  Bed Mobility Overal bed mobility: Needs Assistance Bed Mobility: Supine to Sit, Sit to Supine     Supine to sit: Min assist Sit to supine: Min assist   General bed mobility comments: min A to elevate trunk to sitting and manage RLE     Transfers Overall transfer level: Needs assistance Equipment used: Rolling walker (2 wheels) Transfers: Sit to/from Stand Sit to Stand: Min assist           General transfer comment: min A to boost to stand and steady on rise, max cues for hand placement    Ambulation/Gait Ambulation/Gait assistance: Min assist, Contact guard assist Gait Distance (Feet): 39 Feet (x2) Assistive device: Rolling walker (2 wheels) Gait Pattern/deviations: Step-to pattern, Decreased stride length, Trunk flexed, Decreased dorsiflexion - right, Decreased step length - right Gait velocity: decr     General Gait Details: over time R leg dragging further behind with cues for stopping and bringing leg into walker, more flexed over time as well with stops for correcting posture prior to continuing. min A to steady, x1 seated rest due to fatigue, on second bout improved RLE clearance with slower and more deliberate cadence   Stairs             Wheelchair Mobility     Tilt Bed    Modified Rankin (Stroke Patients Only)       Balance Overall balance assessment: Needs assistance Sitting-balance support: Feet supported, No upper extremity supported Sitting balance-Leahy Scale: Fair     Standing balance support: Bilateral upper extremity supported, During functional activity Standing balance-Leahy Scale: Poor Standing balance comment: reliant on UE support on RW in standing                            Communication Communication Communication: Impaired Factors Affecting Communication: Reduced clarity of speech  Cognition Arousal: Alert Behavior During Therapy: [facility] for tasks assessed/performed  Following commands: Intact Following commands impaired: Only follows one step commands consistently, Follows multi-step commands with increased time    Cueing Cueing Techniques: Verbal cues, Tactile cues  Exercises      General Comments  General comments (skin integrity, edema, etc.): VSS      Pertinent Vitals/Pain Pain Assessment Pain Assessment: No/denies pain Pain Intervention(s): Monitored during session    Home Living                          Prior Function            PT Goals (current goals can now be found in the care plan section) Acute Rehab PT Goals PT Goal Formulation: With patient Time For Goal Achievement: 08/02/23 Progress towards PT goals: Progressing toward goals    Frequency    Min 3X/week      PT Plan      Co-evaluation              AM-PAC PT "6 Clicks" Mobility   Outcome Measure  Help needed turning from your back to your side while in a flat bed without using bedrails?: A Little Help needed moving from lying on your back to sitting on the side of a flat bed without using bedrails?: A Little Help needed moving to and from a bed to a chair (including a wheelchair)?: A Little Help needed standing up from a chair using your arms (e.g., wheelchair or bedside chair)?: A Little Help needed to walk in hospital room?: A Lot Help needed climbing 3-5 steps with a railing? : Total 6 Click Score: 15    End of Session Equipment Utilized During Treatment: Gait belt Activity Tolerance: Patient limited by fatigue Patient left: in bed;with call bell/phone within reach;with bed alarm set Nurse Communication: Mobility status PT Visit Diagnosis: Other abnormalities of gait and mobility (R26.89);Muscle weakness (generalized) (M62.81)     Time: 2956-2130 PT Time Calculation (min) (ACUTE ONLY): 26 min  Charges:    $Gait Training: 8-22 mins $Therapeutic Activity: 8-22 mins PT General Charges $$ ACUTE PT VISIT: 1 Visit                     Trachelle Low R. PTA Acute Rehabilitation Services Office: [phone]   Catalina Antigua 07/21/2023, 1:32 PM

## 2023-07-21 NOTE — Progress Notes (Signed)
 There are no discharge planning updates available at this time.  CSW has not received any bed offers for patient as he has Managed Medicaid and no income to support LTC placement. CSW will continue to attempt to locate a SNF to accept patient.  CSW sent secure email to Diane at [facility] requesting a discussion regarding patient's discharge plan.  Edwin Dada, MSW, LCSW Transitions of Care  Clinical Social Worker II [phone]

## 2023-07-21 NOTE — ED Notes (Signed)
 Please give update to brother Luisa Hart); [phone]

## 2023-07-21 NOTE — ED Provider Notes (Signed)
 Emergency Medicine Observation Re-evaluation Note  Kenneth Morales is a 57 y.o. male, seen on rounds today.  Pt initially presented to the ED for complaints of Fall Currently, the patient is resting. No complaints this AM.   Physical Exam  BP (!) 168/85   Pulse 79   Temp 98.3 F (36.8 C) (Oral)   Resp 17   Ht 5\' 10"  (1.778 m)   Wt 72.6 kg   SpO2 96%   BMI 22.96 kg/m  Physical Exam General: NAD Cardiac: RR Lungs: Non-labored Psych: Calm  ED Course / MDM  EKG:EKG Interpretation Date/Time:  Sunday July 03 2023 21:28:31 EDT Ventricular Rate:  74 PR Interval:  138 QRS Duration:  100 QT Interval:  388 QTC Calculation: 430 R Axis:   -83  Text Interpretation: Normal sinus rhythm Left anterior fascicular block Minimal voltage criteria for LVH, may be normal variant ( Cornell product ) TWAs similar to prior EKGs Confirmed by Vivi Barrack [phone]) on 07/03/2023 10:48:27 PM  I have reviewed the labs performed to date as well as medications administered while in observation.  Recent changes in the last 24 hours include none.  Plan  Current plan is for placement.    Coral Spikes, DO 07/21/23 [phone]

## 2023-07-22 NOTE — ED Provider Notes (Signed)
 Emergency Medicine Observation Re-evaluation Note  Kenneth Morales is a 58 y.o. male, seen on rounds today.  Pt initially presented to the ED for complaints of Fall Currently, the patient is awake eating breakfast, no new complaints.  Physical Exam  BP 117/71 (BP Location: Right Arm)   Pulse 80   Temp 99.2 F (37.3 C) (Oral)   Resp 15   Ht 5\' 10"  (1.778 m)   Wt 72.6 kg   SpO2 100%   BMI 22.96 kg/m  Physical Exam General: Awake and alert, no acute distress Cardiac: Regular rate Lungs: No increased WOB Psych: Calm, asleep  ED Course / MDM  EKG:EKG Interpretation Date/Time:  Sunday July 03 2023 21:28:31 EDT Ventricular Rate:  74 PR Interval:  138 QRS Duration:  100 QT Interval:  388 QTC Calculation: 430 R Axis:   -83  Text Interpretation: Normal sinus rhythm Left anterior fascicular block Minimal voltage criteria for LVH, may be normal variant ( Cornell product ) TWAs similar to prior EKGs Confirmed by Vivi Barrack [phone]) on 07/03/2023 10:48:27 PM  I have reviewed the labs performed to date as well as medications administered while in observation.  Recent changes in the last 24 hours include patient remains medically cleared, pending SNF/placement.  Plan  Current plan is for SNF/placement.    Rexford Maus, [place] 07/22/23 [phone]

## 2023-07-22 NOTE — Progress Notes (Signed)
 Occupational Therapy Treatment Patient Details Name: Kenneth Morales MRN: [id number] DOB: 19-Aug-1965 Today's Date: 07/22/2023   History of present illness 58 y.o. male presents to [facility] on 07/03/2023 after a fall. PMH includes MI, CAD, cocaine abuse, depression, HLD, HTN, CVA.   OT comments  Pt currently at min assist level for grooming tasks in standing at the sink with min to mod assist for mobility with and without use of the RW.  Pt still with ongoing right hemiparesis which impacts balance, safety, and overall independence.  Will continue to benefit from acute care OT at this time to progress to a safer and more independent level with selfcare tasks, mobility, and transfers.  Recommend continued inpatient follow up therapy, <3 hours/day for further progression once discharged from acute care.         If plan is discharge home, recommend the following:  A little help with walking and/or transfers;A little help with bathing/dressing/bathroom;Assistance with cooking/housework;Assist for transportation;Help with stairs or ramp for entrance   Equipment Recommendations  Other (comment) (TBD next venue of care)       Precautions / Restrictions Precautions Precautions: Fall Recall of Precautions/Restrictions: Impaired Precaution/Restrictions Comments: R hemiparesis Restrictions Weight Bearing Restrictions Per Provider Order: No       Mobility Bed Mobility Overal bed mobility: Needs Assistance Bed Mobility: Sit to Supine, Sidelying to Sit   Sidelying to sit: Contact guard assist, HOB elevated   Sit to supine: Contact guard assist        Transfers                         Balance Overall balance assessment: Needs assistance   Sitting balance-Leahy Scale: Fair Sitting balance - Comments: Supervision for sitting EOB while donning gown.   Standing balance support: During functional activity, Reliant on assistive device for balance Standing balance-Leahy Scale:  Poor                             ADL either performed or assessed with clinical judgement   ADL Overall ADL's : Needs assistance/impaired Eating/Feeding: Set up;Sitting Eating/Feeding Details (indicate cue type and reason): Provided foam grips to try over utensils.  Pt able to self feed with RUE and increased time with grip in place. Grooming: Oral care;Contact guard assist;Standing Grooming Details (indicate cue type and reason): stood at the sink to brush his teeth         Upper Body Dressing : Minimal assistance;Sitting Upper Body Dressing Details (indicate cue type and reason): to donn hospital gown like a robe.     Toilet Transfer: Moderate assistance;Ambulation Toilet Transfer Details (indicate cue type and reason): Ambulation without assistive device Toileting- Clothing Manipulation and Hygiene: Minimal assistance;Sit to/from stand Toileting - Clothing Manipulation Details (indicate cue type and reason): simulated     Functional mobility during ADLs: Moderate assistance (without assistive device) General ADL Comments: Pt completed grooming tasks in standing at the sink at min assist level and increased time incorporating BUE and using the RUE as an active assist for opening small tops, brushing teeth, and using cup to rinse.  Completed functional mobility without use of RW at mod assist level.  Increased right lean with LLE scissoring and decreased step length.  Increased trunk and cervical flexion needed with mod facilitation to correct.  Finished with use of RW and min assist to ambulate from chair in the hallway to the bed.  Pt still with scissoring, decreased step length on the right, and decreased efficiency to advance the RLE as his toes would catch on the surface.     Communication Communication Communication: Impaired Factors Affecting Communication: Reduced clarity of speech   Cognition Arousal: Alert Behavior During Therapy: WFL for tasks  assessed/performed Cognition: No family/caregiver present to determine baseline     Awareness: Online awareness impaired, Intellectual awareness intact   Attention impairment (select first level of impairment): Selective attention                     Following commands: Intact Following commands impaired: Only follows one step commands consistently, Follows multi-step commands with increased time      Cueing   Cueing Techniques: Verbal cues, Tactile cues             Pertinent Vitals/ Pain       Pain Assessment Pain Assessment: Faces Pain Score: 0-No pain         Frequency  Min 1X/week        Progress Toward Goals  OT Goals(current goals can now be found in the care plan section)  Progress towards OT goals: Progressing toward goals  Acute Rehab OT Goals Patient Stated Goal: Pt did not state during session OT Goal Formulation: With patient Time For Goal Achievement: 08/03/23 Potential to Achieve Goals: Good  Plan         AM-PAC OT "6 Clicks" Daily Activity     Outcome Measure   Help from another person eating meals?: A Little Help from another person taking care of personal grooming?: A Little Help from another person toileting, which includes using toliet, bedpan, or urinal?: A Little Help from another person bathing (including washing, rinsing, drying)?: A Little Help from another person to put on and taking off regular upper body clothing?: A Little Help from another person to put on and taking off regular lower body clothing?: A Little 6 Click Score: 18    End of Session Equipment Utilized During Treatment: Rolling walker (2 wheels)  OT Visit Diagnosis: Unsteadiness on feet (R26.81);History of falling (Z91.81);Cognitive communication deficit (R41.841);Repeated falls (R29.6);Hemiplegia and hemiparesis Symptoms and signs involving cognitive functions: Cerebral infarction Hemiplegia - Right/Left: Right Hemiplegia - dominant/non-dominant:  Dominant Hemiplegia - caused by: Cerebral infarction   Activity Tolerance Patient tolerated treatment well   Patient Left in bed;with call bell/phone within reach;Other (comment);with family/visitor present   Nurse Communication Mobility status        Time: 260 [id number] [id number] OT Time Calculation (min): 44 min  Charges: OT General Charges $OT Visit: 1 Visit OT Treatments $Self Care/Home Management : 38-52 mins   Perrin Maltese, OTR/L Acute Rehabilitation Services  Office [phone] 07/22/2023

## 2023-07-22 NOTE — ED Notes (Signed)
 Pt ambulated safely to the bedside commode with four point walker and assistance of NT. Pt did not have a bowel movement at this time. Pt ambulated safely back into bed with four point walker and assistance of NT.

## 2023-07-23 NOTE — ED Notes (Signed)
 Pt insisted on getting back to bed. I encouraged pt to sit up until after lunch, but pt refused. I assisted pt to get back in bed.

## 2023-07-23 NOTE — ED Notes (Signed)
 I got pt up to recliner chair to eat breakfast.

## 2023-07-23 NOTE — ED Provider Notes (Signed)
 Emergency Medicine Observation Re-evaluation Note  Kenneth Morales is a 58 y.o. male, seen on rounds today.  Pt initially presented to the ED for complaints of Fall Currently, the patient is sleeping in bed.  Physical Exam  BP (!) 141/78   Pulse (!) 58   Temp (!) 97.5 F (36.4 C) (Oral)   Resp 18   Ht 5\' 10"  (1.778 m)   Wt 72.6 kg   SpO2 100%   BMI 22.96 kg/m  Physical Exam General: NAD Cardiac: Regular rate  Lungs: Equal chest rise Psych: calm   ED Course / MDM  EKG:EKG Interpretation Date/Time:  Sunday July 03 2023 21:28:31 EDT Ventricular Rate:  74 PR Interval:  138 QRS Duration:  100 QT Interval:  388 QTC Calculation: 430 R Axis:   -83  Text Interpretation: Normal sinus rhythm Left anterior fascicular block Minimal voltage criteria for LVH, may be normal variant ( Cornell product ) TWAs similar to prior EKGs Confirmed by Vivi Barrack [phone]) on 07/03/2023 10:48:27 PM  I have reviewed the labs performed to date as well as medications administered while in observation.  Recent changes in the last 24 hours include none.  Plan  Current plan is for placement.    Lonell Grandchild, MD 07/23/23 1147

## 2023-07-24 NOTE — ED Notes (Signed)
 Pt set up with things for a bath. NT was bedside while pt bathed himself.

## 2023-07-24 NOTE — ED Provider Notes (Signed)
 Emergency Medicine Observation Re-evaluation Note  Kenneth Morales is a 58 y.o. male, seen on rounds today.  Pt initially presented to the ED for complaints of Fall Currently, the patient is resting, denies complaints.  Physical Exam  BP (!) 141/87 (BP Location: Left Arm)   Pulse 77   Temp 99.1 F (37.3 C) (Oral)   Resp 18   Ht 5\' 10"  (1.778 m)   Wt 72.6 kg   SpO2 99%   BMI 22.96 kg/m  Physical Exam General: NAD Cardiac: regular rate Lungs: equal chest rise Psych: calm  ED Course / MDM  EKG:EKG Interpretation Date/Time:  Sunday July 03 2023 21:28:31 EDT Ventricular Rate:  74 PR Interval:  138 QRS Duration:  100 QT Interval:  388 QTC Calculation: 430 R Axis:   -83  Text Interpretation: Normal sinus rhythm Left anterior fascicular block Minimal voltage criteria for LVH, may be normal variant ( Cornell product ) TWAs similar to prior EKGs Confirmed by Vivi Barrack [phone]) on 07/03/2023 10:48:27 PM  I have reviewed the labs performed to date as well as medications administered while in observation.  Recent changes in the last 24 hours include none.  Plan  Current plan is for placement.    Lonell Grandchild, MD 07/24/23 1247

## 2023-07-24 NOTE — ED Notes (Signed)
 Nutrition services called for an egg salad sandwich per pt request.

## 2023-07-24 NOTE — ED Notes (Signed)
 The pts tray arrived approx 3o minutes ago the pt is asleep and he told me earlier that he did not like the food anyway

## 2023-07-24 NOTE — ED Notes (Signed)
 The pt  is watching tv  his untouched tray of food was still on the bedside table  he reports that the food here is not good I thin family brought him food earlier today

## 2023-07-25 NOTE — Progress Notes (Addendum)
 3:15pm: CSW received call from [place], RN Case Manager of [employer] [phone]) to discuss patient. [place] and CSW discussed attempts to obtain placement and barriers this far. [place] recommended CSW contact the Medicaid Ombudsman to assist patient with switching his Managed Medicaid to [facility] to hopefully assist with placement.   CSW will contact Ombudsman to determine what options patient may have.  12pm: CSW spoke with [place] at [facility] to discuss patient's insurance benefits. Jeanice Lim states she will notify case management staff of patient's needs and to request they contact CSW for collaboration on discharge planning.  Edwin Dada, MSW, LCSW Transitions of Care  Clinical Social Worker II [phone]

## 2023-07-25 NOTE — ED Notes (Signed)
 Pt encouraged to sit at side of bed and use urinal. Pt attempted but states he could not urinate. Pt requesting to use condom catheter.

## 2023-07-25 NOTE — Progress Notes (Signed)
 Physical Therapy Treatment Patient Details Name: Kenneth Morales MRN: [id number] DOB: Nov 14, 1965 Today's Date: 07/25/2023   History of Present Illness 58 y.o. male presents to [facility] hospital on 07/03/2023 after a fall. PMH includes MI, CAD, cocaine abuse, depression, HLD, HTN, CVA.    PT Comments  Pt tolerates treatment well. Pt with impaired cognition, reporting multiple times that his left side was affected by his prior CVA, despite signs of R hemiparesis throughout session. Pt demonstrates RLE weakness resulting in foot drag and instability when ambulating. Pt is at a high risk for falls due to this weakness, along with impaired safety awareness. PT discusses a plan for increased mobility with nursing staff, including sitting in the recliner for all meals and ambulating short distances to the bathroom with staff assistance and use of a RW. PT will continue to follow to progress balance and ambulation tolerance. Patient will benefit from continued inpatient follow up therapy, <3 hours/day.   If plan is discharge home, recommend the following: A little help with walking and/or transfers;Direct supervision/assist for medications management;Help with stairs or ramp for entrance;Assist for transportation;Assistance with cooking/housework   Can travel by private vehicle     No  Equipment Recommendations  Wheelchair (measurements PT);Wheelchair cushion (measurements PT);BSC/3in1    Recommendations for Other Services       Precautions / Restrictions Precautions Precautions: Fall Recall of Precautions/Restrictions: Impaired Precaution/Restrictions Comments: R hemiparesis Restrictions Weight Bearing Restrictions Per Provider Order: No     Mobility  Bed Mobility Overal bed mobility: Needs Assistance Bed Mobility: Supine to Sit, Sit to Supine     Supine to sit: Contact guard Sit to supine: Contact guard assist        Transfers Overall transfer level: Needs assistance Equipment used:  Rolling walker (2 wheels) Transfers: Sit to/from Stand Sit to Stand: Min assist           General transfer comment: consistently requiring verbal cues for hand placement and increased anterior weight shift to improve transfer efficiency    Ambulation/Gait Ambulation/Gait assistance: Min assist Gait Distance (Feet): 60 Feet (60' x 2) Assistive device: Rolling walker (2 wheels) Gait Pattern/deviations: Step-to pattern, Decreased stance time - right, Decreased dorsiflexion - right Gait velocity: reduced Gait velocity interpretation: <1.31 ft/sec, indicative of household ambulator   General Gait Details: pt with slowed step-to gait, frequent R foot drag which pt intermittently compensates for with increased hip flexion. Anterior lean with fatigue requiring support of PT to correct and prevent loss of balance   Stairs             Wheelchair Mobility     Tilt Bed    Modified Rankin (Stroke Patients Only)       Balance Overall balance assessment: Needs assistance Sitting-balance support: No upper extremity supported, Feet supported Sitting balance-Leahy Scale: Fair     Standing balance support: Bilateral upper extremity supported, Reliant on assistive device for balance Standing balance-Leahy Scale: Poor                              Communication Communication Communication: Impaired Factors Affecting Communication: Reduced clarity of speech  Cognition Arousal: Alert Behavior During Therapy: WFL for tasks assessed/performed   PT - Cognitive impairments: Awareness, Memory, Sequencing, Problem solving, Safety/Judgement                       PT - Cognition Comments: pt reports his left side is  weak when initially asked by PT. Pt again reports L weakness from his prior CVA after ambulating, despite consistent R foot drag Following commands: Intact      Cueing Cueing Techniques: Verbal cues  Exercises      General Comments General comments  (skin integrity, edema, etc.): VSS on RA      Pertinent Vitals/Pain Pain Assessment Pain Assessment: No/denies pain    Home Living                          Prior Function            PT Goals (current goals can now be found in the care plan section) Acute Rehab PT Goals Patient Stated Goal: to get to rehab Progress towards PT goals: Progressing toward goals    Frequency    Min 3X/week      PT Plan      Co-evaluation              AM-PAC PT "6 Clicks" Mobility   Outcome Measure  Help needed turning from your back to your side while in a flat bed without using bedrails?: A Little Help needed moving from lying on your back to sitting on the side of a flat bed without using bedrails?: A Little Help needed moving to and from a bed to a chair (including a wheelchair)?: A Little Help needed standing up from a chair using your arms (e.g., wheelchair or bedside chair)?: A Little Help needed to walk in hospital room?: A Little Help needed climbing 3-5 steps with a railing? : Total 6 Click Score: 16    End of Session Equipment Utilized During Treatment: Gait belt Activity Tolerance: Patient tolerated treatment well Patient left: in bed;with call bell/phone within reach;with bed alarm set Nurse Communication: Mobility status PT Visit Diagnosis: Other abnormalities of gait and mobility (R26.89);Muscle weakness (generalized) (M62.81)     Time: 1610-9604 PT Time Calculation (min) (ACUTE ONLY): 24 min  Charges:    $Gait Training: 8-22 mins $Therapeutic Activity: 8-22 mins PT General Charges $$ ACUTE PT VISIT: 1 Visit                     Arlyss Gandy, PT, DPT Acute Rehabilitation Office [phone]    Arlyss Gandy 07/25/2023, 12:07 PM

## 2023-07-25 NOTE — ED Notes (Signed)
Patient was given a cup of ginger ale. 

## 2023-07-25 NOTE — ED Provider Notes (Signed)
 Emergency Medicine Observation Re-evaluation Note  Kenneth Morales is a 58 y.o. male, seen on rounds today.  Pt initially presented to the ED for complaints of Fall Currently, the patient is resting in bed asleep without distress.  Physical Exam  BP (!) 140/88 (BP Location: Right Arm)   Pulse 71   Temp 98.7 F (37.1 C) (Oral)   Resp 17   Ht 5\' 10"  (1.778 m)   Wt 72.6 kg   SpO2 94%   BMI 22.96 kg/m  Physical Exam General: Resting without agitation Cardiac: Not tachycardic on last vital signs Lungs: Symmetric rise and fall of chest without respiratory distress Psych: No agitation  ED Course / MDM  EKG:EKG Interpretation Date/Time:  Sunday July 03 2023 21:28:31 EDT Ventricular Rate:  74 PR Interval:  138 QRS Duration:  100 QT Interval:  388 QTC Calculation: 430 R Axis:   -83  Text Interpretation: Normal sinus rhythm Left anterior fascicular block Minimal voltage criteria for LVH, may be normal variant ( Cornell product ) TWAs similar to prior EKGs Confirmed by Vivi Barrack [phone]) on 07/03/2023 10:48:27 PM  I have reviewed the labs performed to date as well as medications administered while in observation.  Recent changes in the last 24 hours include none reported by overnight nursing.  Plan  Current plan is for awaiting placement.    Raylen Tangonan, Canary Brim, MD 07/25/23 [phone]

## 2023-07-26 NOTE — Progress Notes (Signed)
 CSW spoke with Jory Sims Director to discuss patient going to ALF with an LOG. Zack agreeable to issue LOG to [facility] if they are able to accept patient.  CSW spoke with Hydia at [facility] to discuss patient. Hydia agreeable to complete necessary paperwork that is required to receive LOG payments.   CSW sent documents and clinicals to Hydia for further review.  Edwin Dada, MSW, LCSW Transitions of Care  Clinical Social Worker II [phone]

## 2023-07-26 NOTE — ED Provider Notes (Signed)
 Emergency Medicine Observation Re-evaluation Note  Kenneth Morales is a 58 y.o. male, seen on rounds today.  Pt initially presented to the ED for complaints of Fall Currently, the patient is sleeping.  Physical Exam  BP 137/73 (BP Location: Right Arm)   Pulse 72   Temp 99.9 F (37.7 C) (Oral)   Resp 16   Ht 5\' 10"  (1.778 m)   Wt 72.6 kg   SpO2 97%   BMI 22.96 kg/m  Physical Exam General: nad Cardiac: regular Lungs: clear Psych: at baseline and cooperative  ED Course / MDM  EKG:EKG Interpretation Date/Time:  Sunday July 03 2023 21:28:31 EDT Ventricular Rate:  74 PR Interval:  138 QRS Duration:  100 QT Interval:  388 QTC Calculation: 430 R Axis:   -83  Text Interpretation: Normal sinus rhythm Left anterior fascicular block Minimal voltage criteria for LVH, may be normal variant ( Cornell product ) TWAs similar to prior EKGs Confirmed by Vivi Barrack [phone]) on 07/03/2023 10:48:27 PM  I have reviewed the labs performed to date as well as medications administered while in observation.  Recent changes in the last 24 hours include still seeking placement.  Plan  Current plan is for snf placement.    Gwyneth Sprout, MD 07/26/23 [phone]

## 2023-07-26 NOTE — ED Notes (Signed)
 Please give update to brother Luisa Hart); [phone]

## 2023-07-27 NOTE — Progress Notes (Signed)
 Occupational Therapy Treatment Patient Details Name: Kenneth Morales MRN: [id number] DOB: 07/31/1965 Today's Date: 07/27/2023   History of present illness 58 y.o. male presents to [facility] hospital on 07/03/2023 after a fall. PMH includes MI, CAD, cocaine abuse, depression, HLD, HTN, CVA.   OT comments  Patient received in supine and agreeable to OT treatment. Patient able to get to EOB with CGA. Patient declined going to sink for grooming tasks due to complaints of fatigue from PT earlier and patient had a shower earlier. Patient able to perform grooming tasks seated on EOB and performed BUE ROM and strengthening with yellow therapy band before returning to supine. Patient will benefit from continued inpatient follow up therapy, <3 hours/day. Acute OT to continue to follow to address established goals to facilitate DC to next venue of care.       If plan is discharge home, recommend the following:  A little help with walking and/or transfers;A little help with bathing/dressing/bathroom;Assistance with cooking/housework;Assist for transportation;Help with stairs or ramp for entrance   Equipment Recommendations  Other (comment) (TBD next venue of care)    Recommendations for Other Services      Precautions / Restrictions Precautions Precautions: Fall Recall of Precautions/Restrictions: Impaired Precaution/Restrictions Comments: R hemiparesis Restrictions Weight Bearing Restrictions Per Provider Order: No       Mobility Bed Mobility Overal bed mobility: Needs Assistance Bed Mobility: Supine to Sit, Sit to Supine     Supine to sit: Contact guard Sit to supine: Contact guard assist   General bed mobility comments: assistance to eleevate trunk    Transfers Overall transfer level: Needs assistance Equipment used: 1 person hand held assist Transfers: Sit to/from Stand Sit to Stand: Min assist           General transfer comment: stood at bedside for side stepping towards [facility] with  min assist with HHA     Balance Overall balance assessment: Needs assistance Sitting-balance support: No upper extremity supported, Feet supported Sitting balance-Leahy Scale: Fair     Standing balance support: Single extremity supported, During functional activity Standing balance-Leahy Scale: Poor Standing balance comment: stood at EOB with HHO                           ADL either performed or assessed with clinical judgement   ADL Overall ADL's : Needs assistance/impaired     Grooming: Wash/dry hands;Wash/dry face;Set up;Sitting Grooming Details (indicate cue type and reason): on EOB             Lower Body Dressing: Minimal assistance;Sitting/lateral leans Lower Body Dressing Details (indicate cue type and reason): for socks               General ADL Comments: Patient stating fatigued and declined standing ADL tasks    Extremity/Trunk Assessment              Vision       Perception     Praxis     Communication Communication Communication: Impaired Factors Affecting Communication: Reduced clarity of speech   Cognition Arousal: Alert Behavior During Therapy: WFL for tasks assessed/performed Cognition: No family/caregiver present to determine baseline     Awareness: Online awareness impaired, Intellectual awareness intact   Attention impairment (select first level of impairment): Selective attention                     Following commands: Intact Following commands impaired: Only follows one step commands  consistently, Follows multi-step commands with increased time      Cueing   Cueing Techniques: Verbal cues  Exercises Exercises: General Upper Extremity General Exercises - Upper Extremity Shoulder Flexion: AROM, Both, 10 reps, Seated Shoulder ABduction: AROM, Both, 10 reps, Seated Shoulder Horizontal ABduction: Strengthening, Both, 10 reps, Seated, Theraband Theraband Level (Shoulder Horizontal Abduction): Level 1  (Yellow) Elbow Flexion: Strengthening, Both, 10 reps, Seated, Theraband Theraband Level (Elbow Flexion): Level 1 (Yellow) Elbow Extension: Strengthening, Both, 10 reps, Seated, Theraband Theraband Level (Elbow Extension): Level 1 (Yellow)    Shoulder Instructions       General Comments      Pertinent Vitals/ Pain       Pain Assessment Pain Assessment: No/denies pain Pain Intervention(s): Monitored during session  Home Living                                          Prior Functioning/Environment              Frequency  Min 1X/week        Progress Toward Goals  OT Goals(current goals can now be found in the care plan section)  Progress towards OT goals: Progressing toward goals  Acute Rehab OT Goals Patient Stated Goal: none stated OT Goal Formulation: With patient Time For Goal Achievement: 08/03/23 Potential to Achieve Goals: Good ADL Goals Pt Will Perform Eating: with modified independence;with adaptive utensils;sitting Pt Will Perform Grooming: with supervision;standing (two tasks) Pt Will Perform Lower Body Bathing: with supervision;sit to/from stand Pt Will Perform Lower Body Dressing: with supervision;sit to/from stand Pt Will Transfer to Toilet: with supervision;ambulating;bedside commode Pt Will Perform Toileting - Clothing Manipulation and hygiene: with supervision;sit to/from stand Pt Will Perform Tub/Shower Transfer: with supervision;Tub transfer;shower seat Pt/caregiver will Perform Home Exercise Program: Right Upper extremity;With Supervision;Increased strength  Plan      Co-evaluation                 AM-PAC OT "6 Clicks" Daily Activity     Outcome Measure   Help from another person eating meals?: A Little Help from another person taking care of personal grooming?: A Little Help from another person toileting, which includes using toliet, bedpan, or urinal?: A Little Help from another person bathing (including  washing, rinsing, drying)?: A Little Help from another person to put on and taking off regular upper body clothing?: A Little Help from another person to put on and taking off regular lower body clothing?: A Little 6 Click Score: 18    End of Session    OT Visit Diagnosis: Unsteadiness on feet (R26.81);History of falling (Z91.81);Cognitive communication deficit (R41.841);Repeated falls (R29.6);Hemiplegia and hemiparesis Symptoms and signs involving cognitive functions: Cerebral infarction Hemiplegia - Right/Left: Right Hemiplegia - dominant/non-dominant: Dominant Hemiplegia - caused by: Cerebral infarction   Activity Tolerance Patient tolerated treatment well   Patient Left in bed;with call bell/phone within reach   Nurse Communication Mobility status        Time: 1351-1405 OT Time Calculation (min): 14 min  Charges: OT General Charges $OT Visit: 1 Visit OT Treatments $Self Care/Home Management : 8-22 mins  Alfonse Flavors, OTA Acute Rehabilitation Services  Office [phone]   Dewain Penning 07/27/2023, 2:45 PM

## 2023-07-27 NOTE — ED Provider Notes (Signed)
 Emergency Medicine Observation Re-evaluation Note  Kenneth Morales is a 58 y.o. male, seen on rounds today.  Pt initially presented to the ED for complaints of Fall Currently, the patient is sitting up in bed, just a breakfast.  Physical Exam  BP (!) 143/93 (BP Location: Left Arm)   Pulse 66   Temp 98.2 F (36.8 C) (Oral)   Resp 20   Ht 5\' 10"  (1.778 m)   Wt 72.6 kg   SpO2 97%   BMI 22.96 kg/m  Physical Exam General: No acute distress Cardiac: Normal rate Lungs: No increased work of breathing Psych: Calm, no complaints  ED Course / MDM  EKG:EKG Interpretation Date/Time:  Sunday July 03 2023 21:28:31 EDT Ventricular Rate:  74 PR Interval:  138 QRS Duration:  100 QT Interval:  388 QTC Calculation: 430 R Axis:   -83  Text Interpretation: Normal sinus rhythm Left anterior fascicular block Minimal voltage criteria for LVH, may be normal variant ( Cornell product ) TWAs similar to prior EKGs Confirmed by Vivi Barrack [phone]) on 07/03/2023 10:48:27 PM  I have reviewed the labs performed to date as well as medications administered while in observation.  Recent changes in the last 24 hours include none.  Plan  Current plan is for still working on outpatient placement, is continuing physical therapy while he is in the emergency department.    Rolan Bucco, MD 07/27/23 [phone]

## 2023-07-27 NOTE — Progress Notes (Signed)
 CSW sent message to Hydia at [facility] to inquire about the status of the necessary documentation that is needed for LOG - CSW waiting on response.  Edwin Dada, MSW, LCSW Transitions of Care  Clinical Social Worker II [phone]

## 2023-07-27 NOTE — Progress Notes (Signed)
 Physical Therapy Treatment Patient Details Name: Kenneth Morales MRN: [id number] DOB: May 27, 1965 Today's Date: 07/27/2023   History of Present Illness 58 y.o. male presents to [facility] hospital on 07/03/2023 after a fall. PMH includes MI, CAD, cocaine abuse, depression, HLD, HTN, CVA.    PT Comments  Pt is progressing towards goals. Currently pt is supervision with bed mobility, sit to stand with RW and CGA to Min A for 80 ft of gait. Pt is at a high risk for falls with intermittent foot drag that significantly increases with fatigue after ~40 ft of gait. Due to pt current functional status, home set up and available assistance at home recommending skilled physical therapy services < 3 hours/day in order to address strength, balance and functional mobility to decrease risk for falls, injury, immobility, skin break down and re-hospitalization.      If plan is discharge home, recommend the following: A little help with walking and/or transfers;Help with stairs or ramp for entrance;Assist for transportation;Assistance with cooking/housework   Can travel by private vehicle     No  Equipment Recommendations  Wheelchair (measurements PT);Wheelchair cushion (measurements PT);BSC/3in1       Precautions / Restrictions Precautions Precautions: Fall Recall of Precautions/Restrictions: Impaired Precaution/Restrictions Comments: R hemiparesis Restrictions Weight Bearing Restrictions Per Provider Order: No     Mobility  Bed Mobility Overal bed mobility: Needs Assistance Bed Mobility: Supine to Sit, Sit to Supine Rolling: Supervision, Used rails Sidelying to sit: HOB elevated, Supervision            Transfers Overall transfer level: Needs assistance Equipment used: Rolling walker (2 wheels) Transfers: Sit to/from Stand Sit to Stand: Supervision           General transfer comment: supervision for safety    Ambulation/Gait Ambulation/Gait assistance: Min assist, Contact guard assist Gait  Distance (Feet): 80 Feet Assistive device: Rolling walker (2 wheels) Gait Pattern/deviations: Step-to pattern, Decreased stance time - right, Decreased dorsiflexion - right Gait velocity: reduced Gait velocity interpretation: <1.31 ft/sec, indicative of household ambulator   General Gait Details: pt with slowed step-to gait, frequent R foot drag with fatigue.  pt intermittently compensates for foot drag with increased hip flexion. Continues with anterior lean with increaed fatigue requiring from CGA to Min A for safety with gait.   Modified Rankin (Stroke Patients Only) Modified Rankin (Stroke Patients Only) Pre-Morbid Rankin Score: Moderate disability Modified Rankin: Moderately severe disability     Balance Overall balance assessment: Needs assistance Sitting-balance support: No upper extremity supported, Feet supported Sitting balance-Leahy Scale: Fair Sitting balance - Comments: no overt LOB   Standing balance support: During functional activity, Bilateral upper extremity supported, Reliant on assistive device for balance Standing balance-Leahy Scale: Fair        Hotel manager: Impaired Factors Affecting Communication: Reduced clarity of speech  Cognition Arousal: Alert Behavior During Therapy: WFL for tasks assessed/performed   PT - Cognitive impairments: Awareness, Memory, Sequencing, Problem solving, Safety/Judgement     PT - Cognition Comments: pt reports his left side is weak when initially asked by PT. Pt again reports L weakness from his prior CVA after ambulating, despite consistent R foot drag Following commands: Intact Following commands impaired: Only follows one step commands consistently, Follows multi-step commands with increased time    Cueing Cueing Techniques: Verbal cues     General Comments General comments (skin integrity, edema, etc.): No signs/symptoms of cardiac/respiratory distress with activity.      Pertinent  Vitals/Pain Pain Assessment Pain Assessment: No/denies  pain     PT Goals (current goals can now be found in the care plan section) Acute Rehab PT Goals Patient Stated Goal: to get to rehab PT Goal Formulation: With patient Time For Goal Achievement: 08/02/23 Potential to Achieve Goals: Good Additional Goals Additional Goal #1: Patient to propel wheelchair 150' with S Progress towards PT goals: Progressing toward goals    Frequency    Min 3X/week      PT Plan  Continue with current POC        AM-PAC PT "6 Clicks" Mobility   Outcome Measure  Help needed turning from your back to your side while in a flat bed without using bedrails?: A Little Help needed moving from lying on your back to sitting on the side of a flat bed without using bedrails?: A Little Help needed moving to and from a bed to a chair (including a wheelchair)?: A Little Help needed standing up from a chair using your arms (e.g., wheelchair or bedside chair)?: A Little Help needed to walk in hospital room?: A Little Help needed climbing 3-5 steps with a railing? : A Lot 6 Click Score: 17    End of Session Equipment Utilized During Treatment: Gait belt Activity Tolerance: Patient tolerated treatment well Patient left: in chair;with call bell/phone within reach Nurse Communication: Mobility status;Other (comment) (pt up in chair) PT Visit Diagnosis: Other abnormalities of gait and mobility (R26.89);Muscle weakness (generalized) (M62.81)     Time: 1034-1100 PT Time Calculation (min) (ACUTE ONLY): 26 min  Charges:    $Therapeutic Activity: 23-37 mins PT General Charges $$ ACUTE PT VISIT: 1 Visit                     Harrel Carina, DPT, CLT  Acute Rehabilitation Services Office: [phone] (Secure chat preferred)    Claudia Desanctis 07/27/2023, 3:05 PM

## 2023-07-28 NOTE — [place] FL2 (Signed)
 [place] MEDICAID FL2 LEVEL OF CARE FORM     IDENTIFICATION  Patient Name: Kenneth Morales Birthdate: December 29, 1965 Sex: male Admission Date (Current Location): 07/03/2023  [place] and [place] Number:  Haynes Bast [id number] Facility and Address:  [facility] [place], 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   Ataxia, post-stroke    Gait disturbance, post-stroke    Intractable hiccoughs    Cerebellar infarction (HCC) 07/07/2018   Unstable angina (HCC) 01/26/2018   NSTEMI (non-ST elevated myocardial infarction) (HCC) 11/28/2017   Malignant essential hypertension 07/01/2016   Chest pain 07/01/2016   SOB (shortness of breath) 12/10/2011   CAD (coronary artery disease)    Hyperlipidemia    Hypertension    Tobacco abuse    Cocaine abuse (HCC)     Orientation RESPIRATION BLADDER Height & Weight     Self, Time, Situation, Place  Normal Continent Weight: 160 lb (72.6 kg) Height:  5\' 10"  (177.8 cm)  BEHAVIORAL SYMPTOMS/MOOD NEUROLOGICAL BOWEL NUTRITION STATUS      Continent Diet (See discharge summary)  AMBULATORY STATUS COMMUNICATION OF NEEDS Skin   Limited Assist Verbally Normal                       Personal Care Assistance Level of Assistance  Bathing, Feeding, Dressing Bathing Assistance: Maximum assistance Feeding assistance: Limited assistance Dressing Assistance:  Maximum assistance     Functional Limitations Info  Sight, Hearing, Speech Sight Info: Adequate Hearing Info: Adequate Speech Info: Adequate    SPECIAL CARE FACTORS FREQUENCY  PT (By licensed PT), OT (By licensed OT)     PT Frequency: 5x weekly OT Frequency: 5x weekly            Contractures Contractures Info: Not present    Additional Factors Info  Code Status, Allergies Code Status Info: Full Code Allergies Info: Brilinta (Ticagrelor) Penicillins           Current Medications (07/28/2023):  This is the current hospital active medication list Current Facility-Administered Medications  Medication Dose Route Frequency Provider Last Rate Last Admin   acetaminophen (TYLENOL) tablet 1,000 mg  1,000 mg Oral Q8H PRN Loetta Rough, MD   1,000 mg at 07/26/23 0730   amLODipine (NORVASC) tablet 5 mg  5 mg Oral Daily Arby Barrette, MD   5 mg at 07/27/23 4696   aspirin EC tablet 81 mg  81 mg Oral Daily Arby Barrette, MD   81 mg at 07/27/23 0932   atorvastatin (LIPITOR) tablet 80 mg  80 mg Oral Daily Loetta Rough, MD   80 mg at 07/27/23 0932   clopidogrel (PLAVIX) tablet 75 mg  75 mg Oral Daily Loetta Rough, MD   75 mg at 07/27/23 0932   ezetimibe (ZETIA) tablet 10 mg  10 mg Oral Daily Loetta Rough, MD   10 mg at  07/27/23 0932   losartan (COZAAR) tablet 100 mg  100 mg Oral Daily Loetta Rough, MD   100 mg at 07/27/23 0932   nicotine (NICODERM CQ - dosed in mg/24 hours) patch 14 mg  14 mg Transdermal Daily Loetta Rough, MD   14 mg at 07/27/23 1323   Current Outpatient Medications  Medication Sig Dispense Refill   acetaminophen (TYLENOL) 325 MG tablet Take 2 tablets (650 mg total) by mouth every 4 (four) hours as needed for mild pain (or temp > 37.5 C (99.5 F)).     amLODipine (NORVASC) 5 MG tablet Take 1 tablet (5 mg total) by mouth daily. 30 tablet 1   aspirin 81 MG chewable tablet Chew 1 tablet (81 mg total) by mouth daily. 90 tablet 1   atorvastatin (LIPITOR) 80  MG tablet Take 1 tablet (80 mg total) by mouth daily at 6 PM. 90 tablet 1   clopidogrel (PLAVIX) 75 MG tablet Take 1 tablet (75 mg total) by mouth daily. 90 tablet 0   ezetimibe (ZETIA) 10 MG tablet Take 1 tablet (10 mg total) by mouth daily. 90 tablet 1   feeding supplement (ENSURE ENLIVE / ENSURE PLUS) LIQD Take 237 mLs by mouth 2 (two) times daily between meals. 14220 mL 0   losartan (COZAAR) 100 MG tablet Take 1 tablet (100 mg total) by mouth daily. 30 tablet 1   nicotine (NICODERM CQ - DOSED IN MG/24 HOURS) 14 mg/24hr patch Place 1 patch (14 mg total) onto the skin daily. 90 patch 0     Discharge Medications: Please see discharge summary for a list of discharge medications.  Relevant Imaging Results:  Relevant Lab Results:   Additional Information SSN: [id number]  Inis Sizer, LCSW

## 2023-07-28 NOTE — ED Provider Notes (Signed)
 Emergency Medicine Observation Re-evaluation Note  ZOEY GILKESON is a 58 y.o. male, seen on rounds today.  Pt initially presented to the ED for complaints of Fall Currently, the patient is resting. No physical complaints this am   Physical Exam  BP (!) 112/91   Pulse 70   Temp 98.1 F (36.7 C) (Oral)   Resp 16   Ht 5\' 10"  (1.778 m)   Wt 72.6 kg   SpO2 98%   BMI 22.96 kg/m  Physical Exam General: NAD  Cardiac: RR Lungs: non-labored  Psych: Calm and cooperative   ED Course / MDM  EKG:EKG Interpretation Date/Time:  Sunday July 03 2023 21:28:31 EDT Ventricular Rate:  74 PR Interval:  138 QRS Duration:  100 QT Interval:  388 QTC Calculation: 430 R Axis:   -83  Text Interpretation: Normal sinus rhythm Left anterior fascicular block Minimal voltage criteria for LVH, may be normal variant ( Cornell product ) TWAs similar to prior EKGs Confirmed by Vivi Barrack [phone]) on 07/03/2023 10:48:27 PM  I have reviewed the labs performed to date as well as medications administered while in observation.  Recent changes in the last 24 hours include none.  Plan  Current plan is for placement.    Coral Spikes, DO 07/28/23 1045

## 2023-07-28 NOTE — Progress Notes (Signed)
 CSW received call from Hydia at [employer] requesting patient's mother's maiden name so that she can contact [employer] on his behalf to determine the status of his disability application.  CSW spoke with Sharia Reeve, RN who obtained the answer from patient directly. CSW relayed information to Hydia - Hydia will keep CSW updated with new information as it comes along.  Edwin Dada, MSW, LCSW Transitions of Care  Clinical Social Worker II [phone]

## 2023-07-28 NOTE — ED Notes (Signed)
 Patient received a bed bath, brush his teeth and bed was change.

## 2023-07-28 NOTE — ED Notes (Signed)
 Pt sat himself up in bed and sat on side of bed to eat breakfast. PT fed himself.

## 2023-07-29 MED ORDER — TUBERCULIN PPD 5 UNIT/0.1ML ID SOLN
5.0000 [IU] | Freq: Once | INTRADERMAL | Status: AC
  Administered 2023-07-29: 5 [IU] via INTRADERMAL
  Filled 2023-07-29: qty 0.1

## 2023-07-29 NOTE — ED Notes (Signed)
 OT finished with pt. Ambulated with OT using walker. PT arrives to [facility], will reschedule visit for later this afternoon d/t recent OT visit.

## 2023-07-29 NOTE — Progress Notes (Signed)
 Physical Therapy Treatment Patient Details Name: Kenneth Morales MRN: [id number] DOB: 17-Mar-1966 Today's Date: 07/29/2023   History of Present Illness 58 y.o. male presents to [facility] hospital on 07/03/2023 after a fall. PMH includes MI, CAD, cocaine abuse, depression, HLD, HTN, CVA.    PT Comments  Patient progressing a little with distance though needs constant cues for R foot clearance and still fatigues.  He was able to complete standing balance task at wall rail for working on R foot placement and weight shift.  Patient remains appropriate for inpatient rehab.  Will continue to follow.    If plan is discharge home, recommend the following: A little help with walking and/or transfers;Help with stairs or ramp for entrance;Assist for transportation;Assistance with cooking/housework   Can travel by private vehicle     No  Equipment Recommendations  Wheelchair (measurements PT);Wheelchair cushion (measurements PT);BSC/3in1    Recommendations for Other Services       Precautions / Restrictions Precautions Precautions: Fall Recall of Precautions/Restrictions: Impaired Precaution/Restrictions Comments: R hemiparesis     Mobility  Bed Mobility Overal bed mobility: Needs Assistance   Rolling: Supervision, Used rails Sidelying to sit: Supervision, Used rails       General bed mobility comments: cues for technique    Transfers Overall transfer level: Needs assistance Equipment used: Rolling walker (2 wheels) Transfers: Sit to/from Stand Sit to Stand: Contact guard assist           General transfer comment: assist for balance, cues for safety, sitting in recliner prior to backing up all the way    Ambulation/Gait Ambulation/Gait assistance: Min assist, Mod assist Gait Distance (Feet): 90 Feet Assistive device: Rolling walker (2 wheels) Gait Pattern/deviations: Step-to pattern, Step-through pattern, Decreased stride length, Shuffle, Decreased dorsiflexion - right, Ataxic        General Gait Details: difficulty with R foot clearance throughout, improves each time cues are given though more unsafe with fall risk when fatigued, note decreaed L lateral weight shift and with facilitation moves R foot a little better   Stairs             Wheelchair Mobility     Tilt Bed    Modified Rankin (Stroke Patients Only)       Balance Overall balance assessment: Needs assistance   Sitting balance-Leahy Scale: Good Sitting balance - Comments: EOB reaching to adjust socks in sitting   Standing balance support: Bilateral upper extremity supported Standing balance-Leahy Scale: Poor Standing balance comment: UE support for balance though trying to leave walker to sit in recliner unsafely             High level balance activites: Side stepping High Level Balance Comments: side stepping at rail in hallway with frequent mod cues for performance/technique            Communication Communication Communication: Impaired Factors Affecting Communication: Reduced clarity of speech  Cognition Arousal: Alert Behavior During Therapy: WFL for tasks assessed/performed   PT - Cognitive impairments: Attention, Memory, Problem solving, Safety/Judgement                       PT - Cognition Comments: stated here for couple of months though educated here just under a month. reports PT working with him for 2 hours though really closer to half an hour.  Distracted by people passing in hallway and needing consisted cues for focus on R foot clearance with ambulation and standing balance task at rail Following commands: Intact  Cueing    Exercises      General Comments        Pertinent Vitals/Pain Pain Assessment Pain Assessment: No/denies pain    Home Living                          Prior Function            PT Goals (current goals can now be found in the care plan section) Progress towards PT goals: Progressing toward goals     Frequency    Min 3X/week      PT Plan      Co-evaluation              AM-PAC PT "6 Clicks" Mobility   Outcome Measure  Help needed turning from your back to your side while in a flat bed without using bedrails?: A Little Help needed moving from lying on your back to sitting on the side of a flat bed without using bedrails?: A Little Help needed moving to and from a bed to a chair (including a wheelchair)?: A Little Help needed standing up from a chair using your arms (e.g., wheelchair or bedside chair)?: A Little Help needed to walk in hospital room?: A Little Help needed climbing 3-5 steps with a railing? : A Lot 6 Click Score: 17    End of Session Equipment Utilized During Treatment: Gait belt Activity Tolerance: Patient tolerated treatment well Patient left: in chair   PT Visit Diagnosis: Other abnormalities of gait and mobility (R26.89);Muscle weakness (generalized) (M62.81)     Time: 1610-9604 PT Time Calculation (min) (ACUTE ONLY): 25 min  Charges:    $Gait Training: 8-22 mins $Neuromuscular Re-education: 8-22 mins PT General Charges $$ ACUTE PT VISIT: 1 Visit                     Sheran Lawless, PT Acute Rehabilitation Services Office:[phone] 07/29/2023    Kenneth Morales 07/29/2023, 5:16 PM

## 2023-07-29 NOTE — ED Notes (Signed)
 Pt eating at this time. Will get VS when patient is finish eating

## 2023-07-29 NOTE — Progress Notes (Addendum)
 2:10pm: CSW spoke with Hydia at [facility] who states the facility can accept patient on Monday. Hydia requesting patient receive a TB skin test prior to admission and a home health PT order.  CSW spoke with patient at bedside to present him with bed offer. Patient agreeable to accept bed from [facility] and all questions were answered. CSW informed patient of required TB skin test and he is agreeable.  CSW spoke with MD to have TB skin test and HH PT orders placed  10:50am: CSW received call from [place] at [facility] to inform her of updates.  CSW sent message to Hydia at [facility] to request updates regarding patient - currently waiting for response.  Edwin Dada, MSW, LCSW Transitions of Care  Clinical Social Worker II [phone]

## 2023-07-29 NOTE — Progress Notes (Signed)
 Occupational Therapy Treatment Patient Details Name: Kenneth Morales MRN: [id number] DOB: 1965/10/02 Today's Date: 07/29/2023   History of present illness 58 y.o. male presents to [facility] hospital on 07/03/2023 after a fall. PMH includes MI, CAD, cocaine abuse, depression, HLD, HTN, CVA.   OT comments  Patient making good gains with OT treatment with grooming standing at sink to perform 2-3 tasks, transfers and dressing. Patient able to stand at sink for oral hygiene and face/hand hygiene with CGA. Patient asked to ambulate while bed linen was being changed and returned to EOB to perform dressing with supervision to change gown and socks. Patient will benefit from continued inpatient follow up therapy, <3 hours/day. Acute OT to continue to follow to address established goals to facilitate DC to next venue of care.       If plan is discharge home, recommend the following:  A little help with walking and/or transfers;A little help with bathing/dressing/bathroom;Assistance with cooking/housework;Assist for transportation;Help with stairs or ramp for entrance   Equipment Recommendations  Other (comment) (TBD next venue of care)    Recommendations for Other Services      Precautions / Restrictions Precautions Precautions: Fall Recall of Precautions/Restrictions: Impaired Precaution/Restrictions Comments: R hemiparesis Restrictions Weight Bearing Restrictions Per Provider Order: No       Mobility Bed Mobility Overal bed mobility: Needs Assistance Bed Mobility: Supine to Sit, Sit to Supine Rolling: Supervision, Used rails Sidelying to sit: Supervision, Used rails       General bed mobility comments: supervision for safety    Transfers Overall transfer level: Needs assistance Equipment used: Rolling walker (2 wheels) Transfers: Sit to/from Stand Sit to Stand: Supervision           General transfer comment: supervision for safety to stand from EOB and CGA during transfers and  mobility for safety     Balance Overall balance assessment: Needs assistance Sitting-balance support: No upper extremity supported, Feet supported Sitting balance-Leahy Scale: Fair Sitting balance - Comments: EOB   Standing balance support: Single extremity supported, Bilateral upper extremity supported, During functional activity Standing balance-Leahy Scale: Fair Standing balance comment: able to stand at sink for grooming tasks with one extremity support                           ADL either performed or assessed with clinical judgement   ADL Overall ADL's : Needs assistance/impaired     Grooming: Wash/dry hands;Wash/dry face;Oral care;Contact guard assist;Standing Grooming Details (indicate cue type and reason): performed standing at sink             Lower Body Dressing: Supervision/safety;Sitting/lateral leans Lower Body Dressing Details (indicate cue type and reason): changed socks with figure 4 Toilet Transfer: Contact guard assist;Ambulation;Rolling walker (2 wheels) Toilet Transfer Details (indicate cue type and reason): simulated                Extremity/Trunk Assessment              Vision       Perception     Praxis     Communication Communication Communication: Impaired Factors Affecting Communication: Reduced clarity of speech   Cognition Arousal: Alert Behavior During Therapy: WFL for tasks assessed/performed Cognition: No family/caregiver present to determine baseline         Attention impairment (select first level of impairment): Selective attention  Following commands: Intact Following commands impaired: Only follows one step commands consistently, Follows multi-step commands with increased time      Cueing   Cueing Techniques: Verbal cues  Exercises      Shoulder Instructions       General Comments      Pertinent Vitals/ Pain       Pain Assessment Pain Assessment: No/denies  pain Pain Intervention(s): Monitored during session  Home Living                                          Prior Functioning/Environment              Frequency  Min 1X/week        Progress Toward Goals  OT Goals(current goals can now be found in the care plan section)  Progress towards OT goals: Progressing toward goals  Acute Rehab OT Goals Patient Stated Goal: none stated OT Goal Formulation: With patient Time For Goal Achievement: 08/03/23 Potential to Achieve Goals: Good ADL Goals Pt Will Perform Eating: with modified independence;with adaptive utensils;sitting Pt Will Perform Grooming: with supervision;standing (two tasks) Pt Will Perform Lower Body Bathing: with supervision;sit to/from stand Pt Will Perform Lower Body Dressing: with supervision;sit to/from stand Pt Will Transfer to Toilet: with supervision;ambulating;bedside commode Pt Will Perform Toileting - Clothing Manipulation and hygiene: with supervision;sit to/from stand Pt Will Perform Tub/Shower Transfer: with supervision;Tub transfer;shower seat Pt/caregiver will Perform Home Exercise Program: Right Upper extremity;With Supervision;Increased strength  Plan      Co-evaluation                 AM-PAC OT "6 Clicks" Daily Activity     Outcome Measure   Help from another person eating meals?: A Little Help from another person taking care of personal grooming?: A Little Help from another person toileting, which includes using toliet, bedpan, or urinal?: A Little Help from another person bathing (including washing, rinsing, drying)?: A Little Help from another person to put on and taking off regular upper body clothing?: A Little Help from another person to put on and taking off regular lower body clothing?: A Little 6 Click Score: 18    End of Session Equipment Utilized During Treatment: Gait belt;Rolling walker (2 wheels)  OT Visit Diagnosis: Unsteadiness on feet  (R26.81);History of falling (Z91.81);Cognitive communication deficit (R41.841);Repeated falls (R29.6);Hemiplegia and hemiparesis Symptoms and signs involving cognitive functions: Cerebral infarction Hemiplegia - Right/Left: Right Hemiplegia - dominant/non-dominant: Dominant Hemiplegia - caused by: Cerebral infarction   Activity Tolerance Patient tolerated treatment well   Patient Left in bed;with call bell/phone within reach;with bed alarm set   Nurse Communication Mobility status        Time: 6578-4696 OT Time Calculation (min): 20 min  Charges: OT General Charges $OT Visit: 1 Visit OT Treatments $Self Care/Home Management : 8-22 mins  Alfonse Flavors, OTA Acute Rehabilitation Services  Office [phone]   Dewain Penning 07/29/2023, 12:23 PM

## 2023-07-29 NOTE — ED Notes (Signed)
 OT in to see and work with pt. Pt alert, NAD, calm, interactive, polite, cooperative.

## 2023-07-29 NOTE — ED Notes (Signed)
Pt resting watching tv

## 2023-07-29 NOTE — ED Provider Notes (Addendum)
 Emergency Medicine Observation Re-evaluation Note  Kenneth Morales is a 58 y.o. male, seen on rounds today.  Pt initially presented to the ED for complaints of Fall Currently, the patient is sleeping.  Physical Exam  BP (!) 147/85 (BP Location: Left Arm)   Pulse 62   Temp 97.8 F (36.6 C) (Oral)   Resp 16   Ht 5\' 10"  (1.778 m)   Wt 72.6 kg   SpO2 94%   BMI 22.96 kg/m  Physical Exam General: No acute distress Cardiac: Well-perfused Lungs: Nonlabored Psych: Calm  ED Course / MDM  EKG:EKG Interpretation Date/Time:  Sunday July 03 2023 21:28:31 EDT Ventricular Rate:  74 PR Interval:  138 QRS Duration:  100 QT Interval:  388 QTC Calculation: 430 R Axis:   -83  Text Interpretation: Normal sinus rhythm Left anterior fascicular block Minimal voltage criteria for LVH, may be normal variant ( Cornell product ) TWAs similar to prior EKGs Confirmed by Vivi Barrack [phone]) on 07/03/2023 10:48:27 PM  I have reviewed the labs performed to date as well as medications administered while in observation.  Recent changes in the last 24 hours include social work working on placement.  Plan  Current plan is for placement.    [name], MD 07/29/23 [phone] Social work message me and asked me to order a TB test.  She said he will be discharged to [facility] on Tuesday.   [name], MD 07/29/23 [phone]

## 2023-07-30 NOTE — ED Provider Notes (Signed)
 Emergency Medicine Observation Re-evaluation Note  Kenneth Morales is a 58 y.o. male, seen on rounds today.  Pt initially presented to the ED for complaints of Fall Currently, the patient is sleeping.  Physical Exam  BP 133/87   Pulse 67   Temp 97.9 F (36.6 C) (Oral)   Resp 18   Ht 5\' 10"  (1.778 m)   Wt 72.6 kg   SpO2 97%   BMI 22.96 kg/m  Physical Exam General: No acute distress Cardiac: Well-perfused Lungs: Nonlabored Psych: Calm  ED Course / MDM  EKG:EKG Interpretation Date/Time:  Sunday July 03 2023 21:28:31 EDT Ventricular Rate:  74 PR Interval:  138 QRS Duration:  100 QT Interval:  388 QTC Calculation: 430 R Axis:   -83  Text Interpretation: Normal sinus rhythm Left anterior fascicular block Minimal voltage criteria for LVH, may be normal variant ( Cornell product ) TWAs similar to prior EKGs Confirmed by Vivi Barrack [phone]) on 07/03/2023 10:48:27 PM  I have reviewed the labs performed to date as well as medications administered while in observation.  Recent changes in the last 24 hours include social work working on placement.  Plan  Current plan is for placement, likely [facility] on Tuesday.       Melene Plan, DO 07/30/23 1123

## 2023-07-30 NOTE — ED Notes (Signed)
 Pt assisted to recliner w/ 1x assist and rolling walker, now sitting up eating dinner and watching TV

## 2023-07-31 NOTE — ED Notes (Signed)
 2 visitors @ bedside for pt

## 2023-07-31 NOTE — ED Notes (Signed)
 Pts family member called to speak w/ pt. Pt spilled urine on the floor on accident which he was assited w/ to help clean up. 10% of lunch tray was eaten and he requested to throw the rest away. Pt is currently in bed watching TV

## 2023-07-31 NOTE — ED Notes (Signed)
 Pt asked for the light on in his room before medications were given. Pt also request a ginger ale was given which the RN fulfilled. Pt is in his room sitting in his bed finishing his breakfast

## 2023-07-31 NOTE — ED Notes (Signed)
 TB test read on pt L forearm. No s/s of rx. TB test negative

## 2023-07-31 NOTE — ED Provider Notes (Signed)
 Emergency Medicine Observation Re-evaluation Note  Kenneth Morales is a 58 y.o. male, seen on rounds today.  Pt initially presented to the ED for complaints of Fall Currently, the patient is sleeping.  Physical Exam  BP 138/81 (BP Location: Left Arm)   Pulse 71   Temp 98 F (36.7 C) (Oral)   Resp 20   Ht 5\' 10"  (1.778 m)   Wt 72.6 kg   SpO2 98%   BMI 22.96 kg/m  Physical Exam General: No acute distress Cardiac: Well-perfused Lungs: Nonlabored Psych: Calm  ED Course / MDM  EKG:EKG Interpretation Date/Time:  Sunday July 03 2023 21:28:31 EDT Ventricular Rate:  74 PR Interval:  138 QRS Duration:  100 QT Interval:  388 QTC Calculation: 430 R Axis:   -83  Text Interpretation: Normal sinus rhythm Left anterior fascicular block Minimal voltage criteria for LVH, may be normal variant ( Cornell product ) TWAs similar to prior EKGs Confirmed by Vivi Barrack [phone]) on 07/03/2023 10:48:27 PM  I have reviewed the labs performed to date as well as medications administered while in observation.  Recent changes in the last 24 hours include social work working on placement.  Plan  Current plan is for placement, likely [facility] on Tuesday.        Melene Plan, DO 07/31/23 1237

## 2023-08-01 NOTE — ED Notes (Signed)
 Patient receive a bed bath and complete bed change

## 2023-08-01 NOTE — ED Provider Notes (Signed)
 Emergency Medicine Observation Re-evaluation Note  Kenneth Morales is a 58 y.o. male, seen on rounds today.  Pt initially presented to the ED for complaints of Fall Currently, the patient is asleep, no new concerns by nursing staff.  Physical Exam  BP (!) 142/76 (BP Location: Right Arm)   Pulse 65   Temp 97.7 F (36.5 C) (Oral)   Resp 16   Ht 5\' 10"  (1.778 m)   Wt 72.6 kg   SpO2 96%   BMI 22.96 kg/m  Physical Exam General: Asleep, no acute distress Cardiac: Regular rate Lungs: No increased WOB Psych: Calm, asleep  ED Course / MDM  EKG:EKG Interpretation Date/Time:  Sunday July 03 2023 21:28:31 EDT Ventricular Rate:  74 PR Interval:  138 QRS Duration:  100 QT Interval:  388 QTC Calculation: 430 R Axis:   -83  Text Interpretation: Normal sinus rhythm Left anterior fascicular block Minimal voltage criteria for LVH, may be normal variant ( Cornell product ) TWAs similar to prior EKGs Confirmed by Vivi Barrack [phone]) on 07/03/2023 10:48:27 PM  I have reviewed the labs performed to date as well as medications administered while in observation.  Recent changes in the last 24 hours include patient remains medically cleared. He is recommended for SNF.  Plan  Current plan is for SNF.    Rexford Maus, DO 08/01/23 [phone]

## 2023-08-02 NOTE — ED Provider Notes (Signed)
 After additional conversation with our social work colleagues patient will go to ALF, [facility], discharge.   Gerhard Munch, MD 08/02/23 [phone]

## 2023-08-02 NOTE — ED Notes (Signed)
 Patient transported to facility by [facility].

## 2023-08-02 NOTE — ED Provider Notes (Signed)
 Emergency Medicine Observation Re-evaluation Note  Kenneth Morales is a 58 y.o. male, seen on rounds today.  Pt initially presented to the ED for complaints of Fall Currently, the patient is in no distress.  Physical Exam  BP (!) 153/84 (BP Location: Left Arm)   Pulse (!) 56   Temp 98.4 F (36.9 C) (Oral)   Resp 18   Ht 5\' 10"  (1.778 m)   Wt 72.6 kg   SpO2 100%   BMI 22.96 kg/m  Physical Exam General: Adult male resting Cardiac: Regular rate and rhythm Lungs: No increased work of breathing Psych: Calm  ED Course / MDM  EKG:EKG Interpretation Date/Time:  Sunday July 03 2023 21:28:31 EDT Ventricular Rate:  74 PR Interval:  138 QRS Duration:  100 QT Interval:  388 QTC Calculation: 430 R Axis:   -83  Text Interpretation: Normal sinus rhythm Left anterior fascicular block Minimal voltage criteria for LVH, may be normal variant ( Cornell product ) TWAs similar to prior EKGs Confirmed by Vivi Barrack [phone]) on 07/03/2023 10:48:27 PM  I have reviewed the labs performed to date as well as medications administered while in observation.  Recent changes in the last 24 hours include possible momentum towards disposition.  Have discussed his case this morning with our social work Acupuncturist.  Plan  Current plan is for likely transfer to adult living facility.    Gerhard Munch, MD 08/02/23 [phone]

## 2023-08-02 NOTE — Discharge Instructions (Signed)
Follow-up with your physician or return here for concerning changes in your condition.

## 2023-08-02 NOTE — ED Notes (Signed)
 [facility] called, 7th in line

## 2023-08-02 NOTE — ED Notes (Signed)
 Spoke to [place] and updated patient that we are currently awaiting for [facility]'s coordinator to get back to [place]. Patients family member has been provided with the social workers number if they had any other questions.

## 2023-08-02 NOTE — Progress Notes (Signed)
 Occupational Therapy Treatment Patient Details Name: Kenneth Morales MRN: [id number] DOB: [birth year] Today's Date: 08/02/2023   History of present illness 58 y.o. male presents to [facility] hospital on 07/03/2023 after a fall. PMH includes MI, CAD, cocaine abuse, depression, HLD, HTN, CVA.   OT comments  Patient received in supine and eager to participate. Patient able to get to EOB with supervision and performed gown change and donned socks seated on EOB with supervision. Patient performed grooming tasks standing at sink with CGA and asked to ambulate following. Patient ambulated in hallway before returning to supine. Patient will benefit from continued inpatient follow up therapy, <3 hours/day. Acute OT to continue to follow to address established goals to facilitate DC to next venue of care.       If plan is discharge home, recommend the following:  A little help with walking and/or transfers;A little help with bathing/dressing/bathroom;Assistance with cooking/housework;Assist for transportation;Help with stairs or ramp for entrance   Equipment Recommendations  Other (comment) (TBD next venue of care)    Recommendations for Other Services      Precautions / Restrictions Precautions Precautions: Fall Recall of Precautions/Restrictions: Impaired Precaution/Restrictions Comments: R hemiparesis Restrictions Weight Bearing Restrictions Per Provider Order: No       Mobility Bed Mobility Overal bed mobility: Needs Assistance Bed Mobility: Supine to Sit, Sit to Supine Rolling: Supervision, Used rails Sidelying to sit: Supervision, Used rails       General bed mobility comments: supervision for safety    Transfers Overall transfer level: Needs assistance Equipment used: Rolling walker (2 wheels) Transfers: Sit to/from Stand Sit to Stand: Contact guard assist           General transfer comment: CGA and verbal cues for safety     Balance Overall balance assessment: Needs  assistance Sitting-balance support: No upper extremity supported, Feet supported Sitting balance-Leahy Scale: Good Sitting balance - Comments: EOB   Standing balance support: Single extremity supported, Bilateral upper extremity supported, During functional activity Standing balance-Leahy Scale: Poor Standing balance comment: able to stand at sink for self care tasks with one extremity support                           ADL either performed or assessed with clinical judgement   ADL Overall ADL's : Needs assistance/impaired     Grooming: Wash/dry hands;Wash/dry face;Oral care;Contact guard assist;Standing Grooming Details (indicate cue type and reason): at sink         Upper Body Dressing : Supervision/safety;Sitting Upper Body Dressing Details (indicate cue type and reason): gown change Lower Body Dressing: Supervision/safety;Sitting/lateral leans Lower Body Dressing Details (indicate cue type and reason): changed socks with figure 4             Functional mobility during ADLs: Contact guard assist;Rolling walker (2 wheels) General ADL Comments: asking to perform mobility after standing at sink without seated rest break    Extremity/Trunk Assessment              Vision       Perception     Praxis     Communication Communication Communication: Impaired Factors Affecting Communication: Reduced clarity of speech   Cognition Arousal: Alert Behavior During Therapy: WFL for tasks assessed/performed Cognition: No family/caregiver present to determine baseline             OT - Cognition Comments: able to recall therapist from previous visit  Following commands: Intact Following commands impaired: Only follows one step commands consistently, Follows multi-step commands with increased time      Cueing   Cueing Techniques: Verbal cues  Exercises      Shoulder Instructions       General Comments      Pertinent Vitals/  Pain       Pain Assessment Pain Assessment: No/denies pain Pain Intervention(s): Monitored during session  Home Living                                          Prior Functioning/Environment              Frequency  Min 1X/week        Progress Toward Goals  OT Goals(current goals can now be found in the care plan section)  Progress towards OT goals: Progressing toward goals  Acute Rehab OT Goals Patient Stated Goal: eat a burger OT Goal Formulation: With patient Time For Goal Achievement: 08/03/23 Potential to Achieve Goals: Good ADL Goals Pt Will Perform Eating: with modified independence;with adaptive utensils;sitting Pt Will Perform Grooming: with supervision;standing Pt Will Perform Lower Body Bathing: with supervision;sit to/from stand Pt Will Perform Lower Body Dressing: with supervision;sit to/from stand Pt Will Transfer to Toilet: with supervision;ambulating;bedside commode Pt Will Perform Toileting - Clothing Manipulation and hygiene: with supervision;sit to/from stand Pt Will Perform Tub/Shower Transfer: with supervision;Tub transfer;shower seat Pt/caregiver will Perform Home Exercise Program: Right Upper extremity;With Supervision;Increased strength Additional ADL Goal #3: Pt and brother will verbalize understanding of fall prevention education and list 3 things that can be done in home to reduce risk of falls.  Plan      Co-evaluation                 AM-PAC OT "6 Clicks" Daily Activity     Outcome Measure   Help from another person eating meals?: A Little Help from another person taking care of personal grooming?: A Little Help from another person toileting, which includes using toliet, bedpan, or urinal?: A Little Help from another person bathing (including washing, rinsing, drying)?: A Little Help from another person to put on and taking off regular upper body clothing?: A Little Help from another person to put on and taking  off regular lower body clothing?: A Little 6 Click Score: 18    End of Session Equipment Utilized During Treatment: Gait belt;Rolling walker (2 wheels)  OT Visit Diagnosis: Unsteadiness on feet (R26.81);History of falling (Z91.81);Cognitive communication deficit (R41.841);Repeated falls (R29.6);Hemiplegia and hemiparesis Symptoms and signs involving cognitive functions: Cerebral infarction Hemiplegia - Right/Left: Right Hemiplegia - dominant/non-dominant: Dominant Hemiplegia - caused by: Cerebral infarction   Activity Tolerance Patient tolerated treatment well   Patient Left in bed;with call bell/phone within reach;with bed alarm set   Nurse Communication Mobility status        Time: 2130-8657 OT Time Calculation (min): 28 min  Charges: OT General Charges $OT Visit: 1 Visit OT Treatments $Self Care/Home Management : 8-22 mins $Therapeutic Activity: 8-22 mins  Alfonse Flavors, OTA Acute Rehabilitation Services  Office [phone]   Dewain Penning 08/02/2023, 12:45 PM

## 2023-08-02 NOTE — Progress Notes (Addendum)
 11:25am: CSW spoke with Hydia at [facility] who states the facility is ready to receive patient now. Hydia requesting patient be sent via [facility] as facility transportation is not available at this time.  CSW informed RN and MD of information.  Patient will go to [facility] via [place] - RN to call when ready. The number to call for report is [phone].  10:40am: CSW spoke with patient and his mom at bedside to discuss discharge plan. All questions were answered and there are no further needs at this time.  10:30am: CSW called [facility] and per [employer], the facility is waiting for patient's hospital bed to be delivered, it should be delivered by 2pm. The current plan is for [facility] staff to come pick patient up before 4pm today.  9:30am: Additional attempt made to reach Hydia without success.  8:15am: CSW attempted to reach Hydia at [facility] without success - a text message was sent requesting updates on discharge plan.  Edwin Dada, MSW, LCSW Transitions of Care  Clinical Social Worker II [phone]

## 2023-08-26 NOTE — Progress Notes (Signed)
 The patient presented for a primary care visit on 06/16/23 to establish care.  During the appointment, the patient reported   having a housing SDOH insecurity.   A review of the patient's chart revealed that they do not currently have a primary care provider (PCP) and no future appointments were indicated. Chart review indicated that the pt has an assessment plan for [facility] Care Management referrals.   Congregational nurses are assisting [facility]. The pt also has a Child psychotherapist names Valora Gear who mailed sdoh resources to the pt already. A congregational nurse helped with an ID voucher from the dmv. The pt is getting help from managed care coordination. At this time, no additional support from the [facility] team is necessary.

## 2023-11-19 ENCOUNTER — Other Ambulatory Visit: Payer: Self-pay

## 2023-11-19 ENCOUNTER — Emergency Department (HOSPITAL_COMMUNITY)
Admission: EM | Admit: 2023-11-19 | Discharge: 2023-11-19 | Disposition: A | Discharge status: Skilled Nursing Facility | Attending: Emergency Medicine | Admitting: Emergency Medicine

## 2023-11-19 DIAGNOSIS — I2510 Atherosclerotic heart disease of native coronary artery without angina pectoris: Secondary | ICD-10-CM | POA: Insufficient documentation

## 2023-11-19 DIAGNOSIS — R1030 Lower abdominal pain, unspecified: Secondary | ICD-10-CM | POA: Diagnosis present

## 2023-11-19 DIAGNOSIS — Z79899 Other long term (current) drug therapy: Secondary | ICD-10-CM | POA: Insufficient documentation

## 2023-11-19 DIAGNOSIS — B3749 Other urogenital candidiasis: Secondary | ICD-10-CM | POA: Diagnosis not present

## 2023-11-19 DIAGNOSIS — I10 Essential (primary) hypertension: Secondary | ICD-10-CM | POA: Diagnosis not present

## 2023-11-19 DIAGNOSIS — Z7982 Long term (current) use of aspirin: Secondary | ICD-10-CM | POA: Insufficient documentation

## 2023-11-19 LAB — CBG MONITORING, ED: Glucose-Capillary: 112 mg/dL — ABNORMAL HIGH (ref 70–99)

## 2023-11-19 MED ORDER — HYDROCODONE-ACETAMINOPHEN 5-325 MG PO TABS
1.0000 | ORAL_TABLET | Freq: Once | ORAL | Status: AC
  Administered 2023-11-19: 1 via ORAL
  Filled 2023-11-19: qty 1

## 2023-11-19 MED ORDER — BARRIER CREAM NON-SPECIFIED: 1.0000 | TOPICAL_CREAM | Freq: Two times a day (BID) | TOPICAL | Status: DC | PRN

## 2023-11-19 MED ORDER — NYSTATIN 100000 UNIT/GM EX CREA
TOPICAL_CREAM | CUTANEOUS | 2 refills | Status: AC
Start: 2023-11-19 — End: ?
  Filled 2023-11-19: qty 30, 15d supply, fill #0

## 2023-11-19 MED ORDER — RESTORE BARRIER CREA
2.0000 g | TOPICAL_CREAM | Freq: Two times a day (BID) | 2 refills | Status: AC
  Filled 2023-11-19: qty 120, fill #0

## 2023-11-19 MED ORDER — NYSTATIN 100000 UNIT/GM EX CREA
TOPICAL_CREAM | Freq: Two times a day (BID) | CUTANEOUS | Status: DC
  Filled 2023-11-19: qty 30

## 2023-11-19 MED ORDER — NYSTATIN 100000 UNIT/GM EX CREA
TOPICAL_CREAM | Freq: Two times a day (BID) | CUTANEOUS | Status: DC
  Administered 2023-11-19: 1 via TOPICAL
  Filled 2023-11-19: qty 30

## 2023-11-19 MED ORDER — FLUCONAZOLE 200 MG PO TABS
200.0000 mg | ORAL_TABLET | Freq: Once | ORAL | Status: AC
  Administered 2023-11-19: 200 mg via ORAL
  Filled 2023-11-19: qty 1

## 2023-11-19 NOTE — ED Triage Notes (Signed)
 Patient to ED by EMS from [facility] fro groin pain.Per EMS area has been red and painful for 1-2 weeks denies urinary issues.

## 2023-11-19 NOTE — Discharge Instructions (Addendum)
 It will be very important for you to keep the area dry and change the diaper frequently to avoid the wetness staying on your skin which is also irritating you.  In addition to the yeast medicine you will need to use a barrier cream in that area

## 2023-11-19 NOTE — ED Provider Notes (Signed)
 [name] Provider Note   CSN: [id number] Arrival date & time: 11/19/23  1703     Patient presents with: Groin Pain   Kenneth Morales is a 58 y.o. male.   Patient is a 58 year old male with a history of CAD, hypertension, hyperlipidemia who lives in a facility and presenting today due to a painful rash in his groin that has now been present for a week.  He reports at the place where he is now stating they are putting something on it but it is not helping.  He does wear a diaper and sometimes sits on it for longer than he would like.  He has no issues urinating.  Denies having this issue in the past.  Reports it is very painful.  The history is provided by the patient.  Groin Pain       Prior to Admission medications   Medication Sig Start Date End Date Taking? Authorizing Provider  nystatin  cream (MYCOSTATIN ) Apply to affected area 2 times daily 11/19/23  Yes Karizma Cheek, Benton, MD  protective barrier (RESTORE) CREA Apply 2 g topically 2 (two) times daily. 11/19/23  Yes Doretha Benton, MD  acetaminophen  (TYLENOL ) 325 MG tablet Take 2 tablets (650 mg total) by mouth every 4 (four) hours as needed for mild pain (or temp > 37.5 C (99.5 F)). 07/14/18   Angiulli, Toribio PARAS, PA-C  amLODipine  (NORVASC ) 5 MG tablet Take 1 tablet (5 mg total) by mouth daily. 06/30/23   Kennyth Domino, FNP  aspirin  81 MG chewable tablet Chew 1 tablet (81 mg total) by mouth daily. 06/30/23   Kennyth Domino, FNP  atorvastatin  (LIPITOR ) 80 MG tablet Take 1 tablet (80 mg total) by mouth daily at 6 PM. 06/30/23   Kennyth Domino, FNP  clopidogrel  (PLAVIX ) 75 MG tablet Take 1 tablet (75 mg total) by mouth daily. 06/30/23   Kennyth Domino, FNP  ezetimibe  (ZETIA ) 10 MG tablet Take 1 tablet (10 mg total) by mouth daily. 06/30/23   Kennyth Domino, FNP  feeding supplement (ENSURE ENLIVE / ENSURE PLUS) LIQD Take 237 mLs by mouth 2 (two) times daily between meals. 06/11/23   Rosario Leatrice FERNS, MD  losartan  (COZAAR ) 100 MG tablet Take 1 tablet (100 mg total) by mouth daily. 06/30/23   Kennyth Domino, FNP  nicotine  (NICODERM CQ  - DOSED IN MG/24 HOURS) 14 mg/24hr patch Place 1 patch (14 mg total) onto the skin daily. 06/30/23   Kennyth Domino, FNP    Allergies: Brilinta  [ticagrelor ] and Penicillins    Review of Systems  Updated Vital Signs BP (!) 134/93 (BP Location: Right Arm)   Pulse 66   Temp 98.4 F (36.9 C) (Oral)   Resp 16   Ht 5' 10 (1.778 m)   Wt 73 kg   SpO2 100%   BMI 23.09 kg/m   Physical Exam Vitals and nursing note reviewed.  HENT:     Head: Normocephalic.  Cardiovascular:     Rate and Rhythm: Normal rate.  Pulmonary:     Effort: Pulmonary effort is normal.  Abdominal:     General: Abdomen is flat.     Palpations: Abdomen is soft.     Tenderness: There is no abdominal tenderness.  Genitourinary:    Comments: Significant papular erythematous rash noted in the entire genital area including the penis, testicles and groin as well as inner thighs with small satellite lesions.  Skin is macerated and slightly weeping.  No induration or fluctuance noted  Skin:    General: Skin is warm.     Findings: Rash present.  Neurological:     Mental Status: He is alert. Mental status is at baseline.     (all labs ordered are listed, but only abnormal results are displayed) Labs Reviewed  CBG MONITORING, ED - Abnormal; Notable for the following components:      Result Value   Glucose-Capillary 112 (*)    All other components within normal limits    EKG: None  Radiology: No results found.   Procedures   Medications Ordered in the ED  barrier cream (non-specified) 1 Application (has no administration in time range)  nystatin  cream (MYCOSTATIN ) (has no administration in time range)  fluconazole  (DIFLUCAN ) tablet 200 mg (has no administration in time range)  HYDROcodone -acetaminophen  (NORCO/VICODIN) 5-325 MG per tablet 1 tablet (1 tablet Oral Given 11/19/23  1824)                                    Medical Decision Making Risk OTC drugs. Prescription drug management.   Patient presenting today with significant candidal infection in the groin area as well as most likely contact skin breakdown from infrequent diaper changing.  Patient does not have prior history of the same.  He is not diabetic.  No findings to suggest Fournier's gangrene or deeper infection.  Patient was given nystatin  cream and a barrier cream.  Blood sugar 112.       Final diagnoses:  Candida infection of genital region    ED Discharge Orders          Ordered    nystatin  cream (MYCOSTATIN )        11/19/23 1937    protective barrier (RESTORE) CREA  2 times daily        11/19/23 1937               Doretha Folks, MD 11/19/23 [phone]

## 2023-11-19 NOTE — ED Notes (Signed)
 [facility] called and transportation arranged at this time

## 2023-11-20 ENCOUNTER — Other Ambulatory Visit: Payer: Self-pay

## 2023-11-21 ENCOUNTER — Other Ambulatory Visit: Payer: Self-pay

## 2023-11-22 ENCOUNTER — Other Ambulatory Visit: Payer: Self-pay

## 2023-12-16 ENCOUNTER — Emergency Department (HOSPITAL_COMMUNITY)
Admission: EM | Admit: 2023-12-16 | Discharge: 2023-12-17 | Disposition: A | Discharge status: Home / Self Care | Attending: Emergency Medicine | Admitting: Emergency Medicine

## 2023-12-16 ENCOUNTER — Emergency Department (HOSPITAL_COMMUNITY)

## 2023-12-16 DIAGNOSIS — R0602 Shortness of breath: Secondary | ICD-10-CM | POA: Insufficient documentation

## 2023-12-16 DIAGNOSIS — Z79899 Other long term (current) drug therapy: Secondary | ICD-10-CM | POA: Diagnosis not present

## 2023-12-16 DIAGNOSIS — Z7982 Long term (current) use of aspirin: Secondary | ICD-10-CM | POA: Diagnosis not present

## 2023-12-16 DIAGNOSIS — I2510 Atherosclerotic heart disease of native coronary artery without angina pectoris: Secondary | ICD-10-CM | POA: Insufficient documentation

## 2023-12-16 DIAGNOSIS — Z7902 Long term (current) use of antithrombotics/antiplatelets: Secondary | ICD-10-CM | POA: Diagnosis not present

## 2023-12-16 DIAGNOSIS — J029 Acute pharyngitis, unspecified: Secondary | ICD-10-CM | POA: Insufficient documentation

## 2023-12-16 DIAGNOSIS — R0981 Nasal congestion: Secondary | ICD-10-CM | POA: Diagnosis not present

## 2023-12-16 DIAGNOSIS — D72829 Elevated white blood cell count, unspecified: Secondary | ICD-10-CM | POA: Insufficient documentation

## 2023-12-16 DIAGNOSIS — R059 Cough, unspecified: Secondary | ICD-10-CM | POA: Insufficient documentation

## 2023-12-16 DIAGNOSIS — Z8616 Personal history of COVID-19: Secondary | ICD-10-CM | POA: Diagnosis not present

## 2023-12-16 LAB — CBC WITH DIFFERENTIAL/PLATELET
Abs Immature Granulocytes: 0.02 K/uL (ref 0.00–0.07)
Basophils Absolute: 0 K/uL (ref 0.0–0.1)
Basophils Relative: 0 %
Eosinophils Absolute: 0.2 K/uL (ref 0.0–0.5)
Eosinophils Relative: 2 %
HCT: 37.3 % — ABNORMAL LOW (ref 39.0–52.0)
Hemoglobin: 12 g/dL — ABNORMAL LOW (ref 13.0–17.0)
Immature Granulocytes: 0 %
Lymphocytes Relative: 23 %
Lymphs Abs: 2.7 K/uL (ref 0.7–4.0)
MCH: 30.2 pg (ref 26.0–34.0)
MCHC: 32.2 g/dL (ref 30.0–36.0)
MCV: 93.7 fL (ref 80.0–100.0)
Monocytes Absolute: 0.9 K/uL (ref 0.1–1.0)
Monocytes Relative: 8 %
Neutro Abs: 7.6 K/uL (ref 1.7–7.7)
Neutrophils Relative %: 67 %
Platelets: 438 K/uL — ABNORMAL HIGH (ref 150–400)
RBC: 3.98 MIL/uL — ABNORMAL LOW (ref 4.22–5.81)
RDW: 12.7 % (ref 11.5–15.5)
WBC: 11.5 K/uL — ABNORMAL HIGH (ref 4.0–10.5)
nRBC: 0 % (ref 0.0–0.2)

## 2023-12-16 LAB — BASIC METABOLIC PANEL WITH GFR
Anion gap: 12 (ref 5–15)
BUN: 32 mg/dL — ABNORMAL HIGH (ref 6–20)
CO2: 25 mmol/L (ref 22–32)
Calcium: 9.3 mg/dL (ref 8.9–10.3)
Chloride: 106 mmol/L (ref 98–111)
Creatinine, Ser: 1.25 mg/dL — ABNORMAL HIGH (ref 0.61–1.24)
GFR, Estimated: >60 mL/min (ref >60)
Glucose, Bld: 99 mg/dL (ref 70–99)
Potassium: 4.1 mmol/L (ref 3.5–5.1)
Sodium: 143 mmol/L (ref 135–145)

## 2023-12-16 LAB — RESP PANEL BY RT-PCR (RSV, FLU A&B, COVID)  RVPGX2
Influenza A by PCR: NEGATIVE
Influenza B by PCR: NEGATIVE
Resp Syncytial Virus by PCR: NEGATIVE
SARS Coronavirus 2 by RT PCR: NEGATIVE

## 2023-12-16 LAB — TROPONIN I (HIGH SENSITIVITY)
Troponin I (High Sensitivity): 22 ng/L — ABNORMAL HIGH (ref <18)
Troponin I (High Sensitivity): 18 ng/L — ABNORMAL HIGH (ref <18)

## 2023-12-16 LAB — MAGNESIUM: Magnesium: 2.9 mg/dL — ABNORMAL HIGH (ref 1.7–2.4)

## 2023-12-16 NOTE — ED Provider Triage Note (Signed)
 Emergency Medicine Provider Triage Evaluation Note  Kenneth Morales , a 58 y.o. male  was evaluated in triage.  Pt complains of shortness of breath.  Recently positive for COVID.Kenneth Morales  Review of Systems  Positive: As above Negative: As above  Physical Exam  BP 103/72   Pulse 85   Temp 99.2 F (37.3 C)   Resp 16   SpO2 97%  Gen:   Awake, no distress   Resp:  Normal effort  MSK:   Moves extremities without difficulty  Other:    Medical Decision Making  Medically screening exam initiated at 7:51 PM.  Appropriate orders placed.  Kenneth Morales was informed that the remainder of the evaluation will be completed by another provider, this initial triage assessment does not replace that evaluation, and the importance of remaining in the ED until their evaluation is complete.    Hildegard Loge, PA-C 12/16/23 [phone]

## 2023-12-16 NOTE — ED Notes (Signed)
 Patient transported to x-ray. ?

## 2023-12-16 NOTE — ED Triage Notes (Signed)
 BIBA from [facility] for throat pain, SHOB for a week. Pt has been taking Paxlovid for one week, COVID positive a week ago, today test was negative. 130/70 bp 80 hr 98% r/a 104 cbg 16 rr 97.6 t

## 2023-12-16 NOTE — ED Provider Notes (Signed)
 [place] EMERGENCY DEPARTMENT AT [facility] Provider Note   CSN: [id number] Arrival date & time: 12/16/23  1919     Patient presents with: Sore Throat and Shortness of Breath   Kenneth Morales is a 58 y.o. male with history of CVA requiring placement in [facility] facility who was diagnosed with COVID-19 1 week ago and is currently on Paxlovid.  I presents today with concern for sore throat and shortness of breath since onset of COVID.  Patient extremely anxious saying that he had to be at hospitalized in 2020 for COVID-19.  Also with history of CAD with NSTEMI in the past.  Ataxia and speech impairment following CVA, at baseline per patient.  Dual endplate therapy on aspirin  and Plavix  daily.   HPI     Prior to Admission medications   Medication Sig Start Date End Date Taking? Authorizing Provider  acetaminophen  (TYLENOL ) 325 MG tablet Take 2 tablets (650 mg total) by mouth every 4 (four) hours as needed for mild pain (or temp > 37.5 C (99.5 F)). 07/14/18   Angiulli, Toribio PARAS, PA-C  amLODipine  (NORVASC ) 5 MG tablet Take 1 tablet (5 mg total) by mouth daily. 06/30/23   Kennyth Domino, FNP  aspirin  81 MG chewable tablet Chew 1 tablet (81 mg total) by mouth daily. 06/30/23   Kennyth Domino, FNP  atorvastatin  (LIPITOR ) 80 MG tablet Take 1 tablet (80 mg total) by mouth daily at 6 PM. 06/30/23   Kennyth Domino, FNP  clopidogrel  (PLAVIX ) 75 MG tablet Take 1 tablet (75 mg total) by mouth daily. 06/30/23   Kennyth Domino, FNP  ezetimibe  (ZETIA ) 10 MG tablet Take 1 tablet (10 mg total) by mouth daily. 06/30/23   Kennyth Domino, FNP  feeding supplement (ENSURE ENLIVE / ENSURE PLUS) LIQD Take 237 mLs by mouth 2 (two) times daily between meals. 06/11/23   Rosario Leatrice FERNS, MD  losartan  (COZAAR ) 100 MG tablet Take 1 tablet (100 mg total) by mouth daily. 06/30/23   Kennyth Domino, FNP  nicotine  (NICODERM CQ  - DOSED IN MG/24 HOURS) 14 mg/24hr patch Place 1 patch (14 mg total) onto the skin daily. 06/30/23    Kennyth Domino, FNP  nystatin  cream (MYCOSTATIN ) Apply to affected area 2 times daily 11/19/23   Doretha Folks, MD  protective barrier (RESTORE) CREA Apply 2 g topically 2 (two) times daily. 11/19/23   Doretha Folks, MD    Allergies: Brilinta  [ticagrelor ] and Penicillins    Review of Systems  Constitutional:  Positive for appetite change, chills and fatigue.  HENT:  Positive for sore throat.   Respiratory:  Positive for cough and shortness of breath. Negative for choking and chest tightness.   Cardiovascular: Negative.   Gastrointestinal: Negative.   Genitourinary: Negative.   Neurological: Negative.     Updated Vital Signs BP (!) 150/84 (BP Location: Left Arm)   Pulse 89   Temp 98.6 F (37 C) (Oral)   Resp 18   SpO2 100%   Physical Exam Vitals and nursing note reviewed.  Constitutional:      Appearance: He is not ill-appearing or toxic-appearing.  HENT:     Head: Normocephalic and atraumatic.     Nose: Congestion present.     Mouth/Throat:     Mouth: Mucous membranes are moist.     Pharynx: Uvula midline. Posterior oropharyngeal erythema present. No oropharyngeal exudate or uvula swelling.  Eyes:     General:        Right eye: No discharge.  Left eye: No discharge.     Extraocular Movements: Extraocular movements intact.     Conjunctiva/sclera: Conjunctivae normal.     Pupils: Pupils are equal, round, and reactive to light.  Cardiovascular:     Rate and Rhythm: Normal rate and regular rhythm.     Pulses: Normal pulses.     Heart sounds: Normal heart sounds. No murmur heard. Pulmonary:     Effort: Pulmonary effort is normal. No respiratory distress.     Breath sounds: Normal breath sounds. No wheezing or rales.  Abdominal:     General: Bowel sounds are normal. There is no distension.     Palpations: Abdomen is soft.     Tenderness: There is no abdominal tenderness. There is no guarding or rebound.  Musculoskeletal:        General: No deformity.      Cervical back: Neck supple.     Right lower leg: No edema.     Left lower leg: No edema.  Skin:    General: Skin is warm and dry.     Capillary Refill: Capillary refill takes less than 2 seconds.  Neurological:     Mental Status: He is alert. Mental status is at baseline.  Psychiatric:        Mood and Affect: Mood normal.     (all labs ordered are listed, but only abnormal results are displayed) Labs Reviewed  CBC WITH DIFFERENTIAL/PLATELET - Abnormal; Notable for the following components:      Result Value   WBC 11.5 (*)    RBC 3.98 (*)    Hemoglobin 12.0 (*)    HCT 37.3 (*)    Platelets 438 (*)    All other components within normal limits  BASIC METABOLIC PANEL WITH GFR - Abnormal; Notable for the following components:   BUN 32 (*)    Creatinine, Ser 1.25 (*)    All other components within normal limits  MAGNESIUM - Abnormal; Notable for the following components:   Magnesium 2.9 (*)    All other components within normal limits  TROPONIN I (HIGH SENSITIVITY) - Abnormal; Notable for the following components:   Troponin I (High Sensitivity) 22 (*)    All other components within normal limits  TROPONIN I (HIGH SENSITIVITY) - Abnormal; Notable for the following components:   Troponin I (High Sensitivity) 18 (*)    All other components within normal limits  RESP PANEL BY RT-PCR (RSV, FLU A&B, COVID)  [id number]    EKG: EKG Interpretation Date/Time:  Friday December 16 2023 20:06:46 EDT Ventricular Rate:  89 PR Interval:  154 QRS Duration:  95 QT Interval:  351 QTC Calculation: 427 R Axis:   -48  Text Interpretation: Sinus rhythm Left anterior fascicular block Nonspecific ST abnormality Confirmed by Francesca Fallow ([id number]) on 12/16/2023 11:17:44 PM  Radiology: [facility] Chest 2 View Result Date: 12/16/2023 CLINICAL DATA:  Shortness of breath for 1 week, history of COVID positivity 1 week ago EXAM: CHEST - 2 VIEW COMPARISON:  07/03/2023 FINDINGS: Cardiac shadow is within normal  limits. The lungs are well aerated bilaterally. No focal infiltrate or sizable effusion is seen. No bony abnormality is noted. IMPRESSION: No active cardiopulmonary disease. Electronically Signed   By: Oneil Devonshire M.D.   On: 12/16/2023 20:31     Procedures   Medications Ordered in the ED - No data to display  Medical Decision Making 58 year old male with known COVID-19 infection presenting from facility for sore throat, cough and shortness of breath.  Vitals on normal intake.  Cardiopulmonary is unremarkable, abdominal exam is benign.  Patient is at neurologic baseline per patient and per chart review.  Posterior pharyngeal erythema, no exudate.  No tonsillar enlargement.  Amount and/or Complexity of Data Reviewed Labs:     Details:   CBC with mild leukocytosis of 11.5, mildly anemic with hemoglobin of 12.  Elevated platelets to 438.  BMP with creatinine 1.25 similar to baseline.  Troponin 22 then downtrending to 18 near patient's baseline.  RVP negative today. ECG/medicine tests:     Details: EKG with sinus rhythm without STEMI.     Clinical picture most consistent with previously diagnosed COVID-19 infection.  Overall patient is well-appearing, clinical concern for emergent early condition that would warrant further ED workup and patient management is exceedingly low.  Feel he safe to be discharged back to his facility at this time.  Kazuki voiced understanding of his medical evaluation and treatment plan. Each of their questions answered to their expressed satisfaction.  Return precautions were given.  Patient is well-appearing, stable, and was discharged in good condition.  This chart was dictated using voice recognition software, Dragon. Despite the best efforts of this provider to proofread and correct errors, errors may still occur which can change documentation meaning.      Final diagnoses:  Shortness of breath    ED Discharge Orders      None          Bobette Pleasant JONELLE DEVONNA 12/16/23 2326    Francesca Elsie CROME, MD 12/17/23 [phone]

## 2023-12-16 NOTE — Discharge Instructions (Signed)
 Kenneth Morales's workup in the ER is very reassuring. There is no evidence of pneumonia and his vitals have been normal in the ER. Follow up in the outpatient setting and return to the ER with any new severe symptoms.

## 2023-12-20 ENCOUNTER — Encounter (HOSPITAL_COMMUNITY): Payer: Self-pay | Admitting: Emergency Medicine

## 2023-12-20 ENCOUNTER — Emergency Department (HOSPITAL_COMMUNITY)

## 2023-12-20 ENCOUNTER — Emergency Department (HOSPITAL_COMMUNITY)
Admission: EM | Admit: 2023-12-20 | Discharge: 2023-12-21 | Disposition: A | Discharge status: Home / Self Care | Attending: Emergency Medicine | Admitting: Emergency Medicine

## 2023-12-20 ENCOUNTER — Other Ambulatory Visit: Payer: Self-pay

## 2023-12-20 DIAGNOSIS — R5383 Other fatigue: Secondary | ICD-10-CM | POA: Diagnosis not present

## 2023-12-20 DIAGNOSIS — I2510 Atherosclerotic heart disease of native coronary artery without angina pectoris: Secondary | ICD-10-CM | POA: Diagnosis not present

## 2023-12-20 DIAGNOSIS — Z7982 Long term (current) use of aspirin: Secondary | ICD-10-CM | POA: Insufficient documentation

## 2023-12-20 DIAGNOSIS — I10 Essential (primary) hypertension: Secondary | ICD-10-CM | POA: Insufficient documentation

## 2023-12-20 DIAGNOSIS — R519 Headache, unspecified: Secondary | ICD-10-CM | POA: Diagnosis not present

## 2023-12-20 DIAGNOSIS — Z7901 Long term (current) use of anticoagulants: Secondary | ICD-10-CM | POA: Insufficient documentation

## 2023-12-20 DIAGNOSIS — R0602 Shortness of breath: Secondary | ICD-10-CM | POA: Diagnosis present

## 2023-12-20 DIAGNOSIS — Z79899 Other long term (current) drug therapy: Secondary | ICD-10-CM | POA: Insufficient documentation

## 2023-12-20 LAB — COMPREHENSIVE METABOLIC PANEL WITH GFR
ALT: 15 U/L (ref 0–44)
AST: 19 U/L (ref 15–41)
Albumin: 3.2 g/dL — ABNORMAL LOW (ref 3.5–5.0)
Alkaline Phosphatase: 77 U/L (ref 38–126)
Anion gap: 13 (ref 5–15)
BUN: 27 mg/dL — ABNORMAL HIGH (ref 6–20)
CO2: 20 mmol/L — ABNORMAL LOW (ref 22–32)
Calcium: 9.1 mg/dL (ref 8.9–10.3)
Chloride: 102 mmol/L (ref 98–111)
Creatinine, Ser: 1.45 mg/dL — ABNORMAL HIGH (ref 0.61–1.24)
GFR, Estimated: 55 mL/min — ABNORMAL LOW (ref >60)
Glucose, Bld: 97 mg/dL (ref 70–99)
Potassium: 4.3 mmol/L (ref 3.5–5.1)
Sodium: 135 mmol/L (ref 135–145)
Total Bilirubin: 0.6 mg/dL (ref 0.0–1.2)
Total Protein: 7.6 g/dL (ref 6.5–8.1)

## 2023-12-20 LAB — BRAIN NATRIURETIC PEPTIDE: B Natriuretic Peptide: 24.3 pg/mL (ref 0.0–100.0)

## 2023-12-20 LAB — CBC
HCT: 38 % — ABNORMAL LOW (ref 39.0–52.0)
Hemoglobin: 12.4 g/dL — ABNORMAL LOW (ref 13.0–17.0)
MCH: 30.7 pg (ref 26.0–34.0)
MCHC: 32.6 g/dL (ref 30.0–36.0)
MCV: 94.1 fL (ref 80.0–100.0)
Platelets: 520 K/uL — ABNORMAL HIGH (ref 150–400)
RBC: 4.04 MIL/uL — ABNORMAL LOW (ref 4.22–5.81)
RDW: 12.7 % (ref 11.5–15.5)
WBC: 8.5 K/uL (ref 4.0–10.5)
nRBC: 0 % (ref 0.0–0.2)

## 2023-12-20 LAB — CBG MONITORING, ED: Glucose-Capillary: 98 mg/dL (ref 70–99)

## 2023-12-20 LAB — URINALYSIS, ROUTINE W REFLEX MICROSCOPIC
Bilirubin Urine: NEGATIVE
Glucose, UA: NEGATIVE mg/dL
Hgb urine dipstick: NEGATIVE
Ketones, ur: NEGATIVE mg/dL
Leukocytes,Ua: NEGATIVE
Nitrite: NEGATIVE
Protein, ur: NEGATIVE mg/dL
Specific Gravity, Urine: 1.029 (ref 1.005–1.030)
pH: 5 (ref 5.0–8.0)

## 2023-12-20 LAB — TROPONIN I (HIGH SENSITIVITY)
Troponin I (High Sensitivity): 19 ng/L — ABNORMAL HIGH (ref <18)
Troponin I (High Sensitivity): 21 ng/L — ABNORMAL HIGH (ref <18)

## 2023-12-20 LAB — MAGNESIUM: Magnesium: 2.4 mg/dL (ref 1.7–2.4)

## 2023-12-20 MED ORDER — ACETAMINOPHEN 325 MG PO TABS
650.0000 mg | ORAL_TABLET | Freq: Once | ORAL | Status: AC
  Administered 2023-12-20: 650 mg via ORAL
  Filled 2023-12-20: qty 2

## 2023-12-20 MED ORDER — PREDNISONE 10 MG PO TABS
40.0000 mg | ORAL_TABLET | Freq: Every day | ORAL | 0 refills | Status: AC
Start: 2023-12-20 — End: 2023-12-25
  Filled 2023-12-20: qty 16, 4d supply, fill #0

## 2023-12-20 MED ORDER — LACTATED RINGERS IV BOLUS
1000.0000 mL | Freq: Once | INTRAVENOUS | Status: AC
  Administered 2023-12-20: 1000 mL via INTRAVENOUS

## 2023-12-20 MED ORDER — ALBUTEROL SULFATE HFA 108 (90 BASE) MCG/ACT IN AERS
2.0000 | INHALATION_SPRAY | Freq: Once | RESPIRATORY_TRACT | Status: AC
  Administered 2023-12-20: 2 via RESPIRATORY_TRACT
  Filled 2023-12-20: qty 6.7

## 2023-12-20 MED ORDER — IOHEXOL 350 MG/ML SOLN
75.0000 mL | Freq: Once | INTRAVENOUS | Status: AC | PRN
Start: 2023-12-20 — End: 2023-12-20
  Administered 2023-12-20: 75 mL via INTRAVENOUS

## 2023-12-20 MED ORDER — IPRATROPIUM-ALBUTEROL 0.5-2.5 (3) MG/3ML IN SOLN
3.0000 mL | Freq: Once | RESPIRATORY_TRACT | Status: AC
  Administered 2023-12-20: 3 mL via RESPIRATORY_TRACT
  Filled 2023-12-20: qty 3

## 2023-12-20 MED ORDER — PREDNISONE 20 MG PO TABS
60.0000 mg | ORAL_TABLET | Freq: Once | ORAL | Status: AC
  Administered 2023-12-20: 60 mg via ORAL
  Filled 2023-12-20: qty 3

## 2023-12-20 NOTE — ED Provider Triage Note (Signed)
 Emergency Medicine Provider Triage Evaluation Note  Kenneth Morales , a 58 y.o. male  was evaluated in triage.  Pt complains of fatigue.  Review of Systems  Positive: Fatigue, cough, shortness of breath Negative: Fevers, chills, chest pain  Physical Exam  BP 110/80 (BP Location: Left Arm)   Pulse 82   Temp 98.5 F (36.9 C)   Resp 18   SpO2 97%  Gen:   Awake, no distress   Resp:  Normal effort  MSK:   Moves extremities without difficulty   Medical Decision Making  Medically screening exam initiated at 5:22 PM.  Appropriate orders placed.  EDGER Kenneth Morales was informed that the remainder of the evaluation will be completed by another provider, this initial triage assessment does not replace that evaluation, and the importance of remaining in the ED until their evaluation is complete.  This patient reports being diagnosed with COVID-19 last week and presents to the ED for ongoing shortness of breath, cough, fatigue.  Per chart review, he was seen in the ED 4 days ago and had a negative COVID test at that time.  Patient states that he has had a productive cough but is unable to describe phlegm.  Current breathing is unlabored.  Lungs are clear to auscultation.   Kenneth Motto, MD 12/20/23 [id number]

## 2023-12-20 NOTE — ED Provider Notes (Signed)
 [place] EMERGENCY DEPARTMENT AT [facility] Provider Note   CSN: [id number] Arrival date & time: 12/20/23  1423     Patient presents with: Fatigue   Kenneth Morales is a 58 y.o. male.   HPI Patient presents for cough, shortness of breath, fatigue.  Medical history includes substance abuse, CAD, depression, HLD, HTN, CVA.  He was seen at [facility] 4 days ago.  This was 1 week after being diagnosed with COVID-19.  At that time, he endorsed sore throat and ongoing shortness of breath.  His workup then was reassuring and he was discharged home.  Today, he endorses ongoing productive cough and shortness of breath.  He is unable to describe the sputum.  He arrives today via EMS.  While in EMS triage, he went to use the bathroom and had a unwitnessed fall.  He does endorse striking the back of his head on the wall.  He has since had headache and neck pain.    Prior to Admission medications   Medication Sig Start Date End Date Taking? Authorizing Provider  predniSONE  (DELTASONE ) 10 MG tablet Take 4 tablets (40 mg total) by mouth daily for 4 days. 12/20/23 12/24/23 Yes [name], MD  acetaminophen  (TYLENOL ) 325 MG tablet Take 2 tablets (650 mg total) by mouth every 4 (four) hours as needed for mild pain (or temp > 37.5 C (99.5 F)). 07/14/18   Angiulli, Toribio PARAS, PA-C  amLODipine  (NORVASC ) 5 MG tablet Take 1 tablet (5 mg total) by mouth daily. 06/30/23   Kennyth Domino, FNP  aspirin  81 MG chewable tablet Chew 1 tablet (81 mg total) by mouth daily. 06/30/23   Kennyth Domino, FNP  atorvastatin  (LIPITOR ) 80 MG tablet Take 1 tablet (80 mg total) by mouth daily at 6 PM. 06/30/23   Kennyth Domino, FNP  clopidogrel  (PLAVIX ) 75 MG tablet Take 1 tablet (75 mg total) by mouth daily. 06/30/23   Kennyth Domino, FNP  ezetimibe  (ZETIA ) 10 MG tablet Take 1 tablet (10 mg total) by mouth daily. 06/30/23   Kennyth Domino, FNP  feeding supplement (ENSURE ENLIVE / ENSURE PLUS) LIQD Take 237 mLs by mouth 2 (two) times  daily between meals. 06/11/23   Rosario Leatrice FERNS, MD  losartan  (COZAAR ) 100 MG tablet Take 1 tablet (100 mg total) by mouth daily. 06/30/23   Kennyth Domino, FNP  nicotine  (NICODERM CQ  - DOSED IN MG/24 HOURS) 14 mg/24hr patch Place 1 patch (14 mg total) onto the skin daily. 06/30/23   Kennyth Domino, FNP  nystatin  cream (MYCOSTATIN ) Apply to affected area 2 times daily 11/19/23   Doretha Folks, MD  protective barrier (RESTORE) CREA Apply 2 g topically 2 (two) times daily. 11/19/23   Doretha Folks, MD    Allergies: Brilinta  [ticagrelor ] and Penicillins    Review of Systems  Constitutional:  Positive for fatigue.  Respiratory:  Positive for cough and shortness of breath.   Musculoskeletal:  Positive for neck pain.  Neurological:  Positive for headaches.  All other systems reviewed and are negative.   Updated Vital Signs BP (!) 140/117 (BP Location: Left Arm)   Pulse 81   Temp 98.2 F (36.8 C) (Oral)   Resp 20   Ht 5' 10 (1.778 m)   Wt 73 kg   SpO2 97%   BMI 23.09 kg/m   Physical Exam Vitals and nursing note reviewed.  Constitutional:      General: He is not in acute distress.    Appearance: Normal appearance. He is  well-developed. He is not ill-appearing, toxic-appearing or diaphoretic.  HENT:     Head: Normocephalic and atraumatic.     Right Ear: External ear normal.     Left Ear: External ear normal.     Nose: Nose normal.     Mouth/Throat:     Mouth: Mucous membranes are moist.  Eyes:     Extraocular Movements: Extraocular movements intact.     Conjunctiva/sclera: Conjunctivae normal.  Cardiovascular:     Rate and Rhythm: Normal rate and regular rhythm.  Pulmonary:     Effort: Pulmonary effort is normal. No respiratory distress.     Breath sounds: Normal breath sounds. No wheezing or rales.  Abdominal:     General: There is no distension.     Palpations: Abdomen is soft.     Tenderness: There is no abdominal tenderness.  Musculoskeletal:        General: No  swelling or deformity.     Cervical back: Normal range of motion and neck supple.  Skin:    General: Skin is warm and dry.     Coloration: Skin is not jaundiced or pale.  Neurological:     Mental Status: He is alert and oriented to person, place, and time. Mental status is at baseline.  Psychiatric:        Mood and Affect: Mood normal.        Behavior: Behavior normal.     (all labs ordered are listed, but only abnormal results are displayed) Labs Reviewed  COMPREHENSIVE METABOLIC PANEL WITH GFR - Abnormal; Notable for the following components:      Result Value   CO2 20 (*)    BUN 27 (*)    Creatinine, Ser 1.45 (*)    Albumin 3.2 (*)    GFR, Estimated 55 (*)    All other components within normal limits  CBC - Abnormal; Notable for the following components:   RBC 4.04 (*)    Hemoglobin 12.4 (*)    HCT 38.0 (*)    Platelets 520 (*)    All other components within normal limits  URINALYSIS, ROUTINE W REFLEX MICROSCOPIC - Abnormal; Notable for the following components:   APPearance HAZY (*)    All other components within normal limits  TROPONIN I (HIGH SENSITIVITY) - Abnormal; Notable for the following components:   Troponin I (High Sensitivity) 19 (*)    All other components within normal limits  BRAIN NATRIURETIC PEPTIDE  MAGNESIUM  CBG MONITORING, ED  TROPONIN I (HIGH SENSITIVITY)    EKG: EKG Interpretation Date/Time:  Tuesday December 20 2023 14:54:01 EDT Ventricular Rate:  76 PR Interval:  156 QRS Duration:  104 QT Interval:  388 QTC Calculation: 436 R Axis:   -36  Text Interpretation: Normal sinus rhythm Left axis deviation Nonspecific T wave abnormality Abnormal ECG Confirmed by [name] ([id number]) on 12/20/2023 8:40:34 PM  Radiology: CT Angio Chest PE W and/or Wo Contrast Result Date: 12/20/2023 CLINICAL DATA:  Pulmonary embolus suspected with high probability. Fatigue. EXAM: CT ANGIOGRAPHY CHEST WITH CONTRAST TECHNIQUE: Multidetector CT imaging of the chest was  performed using the standard protocol during bolus administration of intravenous contrast. Multiplanar CT image reconstructions and MIPs were obtained to evaluate the vascular anatomy. RADIATION DOSE REDUCTION: This exam was performed according to the departmental dose-optimization program which includes automated exposure control, adjustment of the mA and/or kV according to patient size and/or use of iterative reconstruction technique. CONTRAST:  75mL OMNIPAQUE  IOHEXOL  350 MG/ML SOLN COMPARISON:  Chest  radiograph 12/20/2023.  CT chest 08/31/2021 FINDINGS: Cardiovascular: Technically adequate study with good opacification of the central and segmental pulmonary arteries. Mild motion artifact. No focal filling defects. No evidence of significant pulmonary embolus. Normal heart size. No pericardial effusions. Normal caliber thoracic aorta. Calcification and noncalcified plaque formation. Mediastinum/Nodes: No enlarged mediastinal, hilar, or axillary lymph nodes. Thyroid  gland, trachea, and esophagus demonstrate no significant findings. Lungs/Pleura: Emphysematous changes throughout the lungs. No airspace disease or consolidation. No pleural effusion or pneumothorax. Motion artifact versus mild dependent changes in the lung bases. Upper Abdomen: No acute abnormalities demonstrated. Musculoskeletal: Degenerative changes in the cervical spine. No acute bony abnormalities. Review of the MIP images confirms the above findings. IMPRESSION: 1. No evidence of significant pulmonary embolus. 2. Mild aortic atherosclerosis. 3. Emphysematous changes in the lungs. No evidence of active pulmonary disease. Electronically Signed   By: Elsie Gravely M.D.   On: 12/20/2023 21:45   CT Cervical Spine Wo Contrast Result Date: 12/20/2023 CLINICAL DATA:  Recent fall with neck pain, initial encounter EXAM: CT CERVICAL SPINE WITHOUT CONTRAST TECHNIQUE: Multidetector CT imaging of the cervical spine was performed without intravenous  contrast. Multiplanar CT image reconstructions were also generated. RADIATION DOSE REDUCTION: This exam was performed according to the departmental dose-optimization program which includes automated exposure control, adjustment of the mA and/or kV according to patient size and/or use of iterative reconstruction technique. COMPARISON:  None Available. FINDINGS: Alignment: Mild straightening of the normal cervical lordosis is noted. Skull base and vertebrae: 7 cervical segments are well visualized. Multilevel disc space narrowing is noted as well as osteophytic change throughout the cervical spine. Mild facet hypertrophic changes are noted. No acute fracture or dislocation is noted. The odontoid is within normal limits. Soft tissues and spinal canal: Surrounding soft tissue structures are unremarkable. Upper chest: Visualized lung apices are within normal limits. Other: None IMPRESSION: Multilevel degenerative change without acute abnormality. Electronically Signed   By: Oneil Devonshire M.D.   On: 12/20/2023 19:48   CT Head Wo Contrast Result Date: 12/20/2023 CLINICAL DATA:  Status post fall. EXAM: CT HEAD WITHOUT CONTRAST TECHNIQUE: Contiguous axial images were obtained from the base of the skull through the vertex without intravenous contrast. RADIATION DOSE REDUCTION: This exam was performed according to the departmental dose-optimization program which includes automated exposure control, adjustment of the mA and/or kV according to patient size and/or use of iterative reconstruction technique. COMPARISON:  July 03, 2023 FINDINGS: Brain: There is generalized cerebral atrophy with widening of the extra-axial spaces and ventricular dilatation. There are areas of decreased attenuation within the white matter tracts of the supratentorial brain, consistent with microvascular disease changes. A chronic right parietooccipital infarct is seen. Small, chronic right basal ganglia lacunar infarcts are also noted. Vascular: No  hyperdense vessel or unexpected calcification. Skull: Normal. Negative for fracture or focal lesion. Sinuses/Orbits: No acute finding. Other: None. IMPRESSION: 1. Generalized cerebral atrophy and microvascular disease changes of the supratentorial brain. 2. Chronic right parietooccipital infarct. 3. Small, chronic right basal ganglia lacunar infarcts. 4. No acute intracranial abnormality. Electronically Signed   By: Suzen Dials M.D.   On: 12/20/2023 19:45   DG Hip Unilat W or Wo Pelvis 2-3 Views Left Result Date: 12/20/2023 CLINICAL DATA:  Status post fall. EXAM: DG HIP (WITH OR WITHOUT PELVIS) 2-3V LEFT COMPARISON:  None Available. FINDINGS: There is no evidence of hip fracture or dislocation. There is no evidence of arthropathy or other focal bone abnormality. IMPRESSION: Negative. Electronically Signed   By: Suzen  Houston M.D.   On: 12/20/2023 19:33   DG Chest 2 View Result Date: 12/20/2023 CLINICAL DATA:  Cough EXAM: CHEST - 2 VIEW COMPARISON:  None Available.  Chest radiograph December 16, 2023 FINDINGS: The heart size and mediastinal contours are within normal limits. Low lung volumes. Both lungs are clear. The visualized skeletal structures are unremarkable. IMPRESSION: No active cardiopulmonary disease.  No new consolidation. Electronically Signed   By: Megan  Zare M.D.   On: 12/20/2023 19:31     Procedures   Medications Ordered in the ED  predniSONE  (DELTASONE ) tablet 60 mg (has no administration in time range)  albuterol  (VENTOLIN  HFA) 108 (90 Base) MCG/ACT inhaler 2 puff (has no administration in time range)  lactated ringers  bolus 1,000 mL (0 mLs Intravenous Stopped 12/20/23 2226)  acetaminophen  (TYLENOL ) tablet 650 mg (650 mg Oral Given 12/20/23 2037)  iohexol  (OMNIPAQUE ) 350 MG/ML injection 75 mL (75 mLs Intravenous Contrast Given 12/20/23 2123)  ipratropium-albuterol  (DUONEB) 0.5-2.5 (3) MG/3ML nebulizer solution 3 mL (3 mLs Nebulization Given 12/20/23 2224)                                     Medical Decision Making Amount and/or Complexity of Data Reviewed Labs: ordered. Radiology: ordered.  Risk OTC drugs. Prescription drug management.   This patient presents to the ED for concern of shortness of breath, this involves an extensive number of treatment options, and is a complaint that carries with it a high risk of complications and morbidity.  The differential diagnosis includes residual symptoms from prior viral illness, pneumonia, PE, CHF, anemia, acidosis, other metabolic derangements   Co morbidities / Chronic conditions that complicate the patient evaluation  substance abuse, CAD, depression, HLD, HTN, CVA   Additional history obtained:  Additional history obtained from EMR External records from outside source obtained and reviewed including N/A   Lab Tests:  I Ordered, and personally interpreted labs.  The pertinent results include:  creatinine is slightly increased in baseline, electrolytes are normal.  No leukocytosis is present.   Imaging Studies ordered:  I ordered imaging studies including x-ray of chest and left hip; CT of head, cervical spine, CTA chest I independently visualized and interpreted imaging which showed no acute findings.  Chronic emphysematous changes in the lungs. I agree with the radiologist interpretation   Cardiac Monitoring: / EKG:  The patient was maintained on a cardiac monitor.  I personally viewed and interpreted the cardiac monitored which showed an underlying rhythm of: Sinus rhythm   Problem List / ED Course / Critical interventions / Medication management  Patient initially presenting to the ED for persistent shortness of breath and cough.  This reportedly follows a recent COVID infection, however, he did test negative for COVID 4 days ago during prior ED visit for the same complaints.  On arrival in the ED, he is well-appearing.  Breathing is unlabored.  Lungs are clear to auscultation.  While in EMS  triage hallway, he went to use the bathroom.  While in bathroom, he had an unwitnessed fall.  This was due to loss of balance.  Additional imaging studies were ordered.  Tylenol  was ordered for analgesia.  Patient's lab work notable for slight increase in creatinine from baseline.  IV fluids were ordered for hydration.  Although imaging studies did not show any acute findings.  Patient does have chronic emphysematous changes of his lungs.  He was given a  DuoNeb and his shortness of breath did improve.  I suspect post-COVID exacerbation of reactive airway disease.  Patient was given albuterol  inhaler with spacer.  Patient to be prescribed burst prednisone .  He was discharged in stable condition. I ordered medication including IV fluids for hydration, Tylenol  for analgesia, prednisone  and DuoNeb for reactive airway disease Reevaluation of the patient after these medicines showed that the patient improved I have reviewed the patients home medicines and have made adjustments as needed  Social Determinants of Health:  Resides in facility     Final diagnoses:  Fatigue, unspecified type  Shortness of breath    ED Discharge Orders          Ordered    predniSONE  (DELTASONE ) 10 MG tablet  Daily        12/20/23 2305               [name], MD 12/20/23 2306

## 2023-12-20 NOTE — ED Notes (Signed)
 C-collar ok to be removed per verbal order from physician.PT given a urinal to produce a sample.

## 2023-12-20 NOTE — ED Notes (Signed)
 Pt has been taken to the bathroom several times today to try to pee.  He would stand in front of wheelchair between wheelchair and toilet and sit back into chair and let us  know when he was done.  PT uses a walker at his facility. He has also been observed trying to get up out of his wheel chair without rails or anything in front of him and has been corrected to sit.  At approx. 1800 he was trying to use the bathroom again and had an unobserved fall. He fell to the floor and states that he hit the left side of his head on the wall.  He complains of scalp and neck soreness.  His right leg began shaking uncontrollably a few minutes after we (Paige-NT and myself) got him up as well.  Dr. Melvenia and the charge nurse have been notified and he is in a C-collar.  We are working on a room to continue his care.

## 2023-12-20 NOTE — ED Triage Notes (Signed)
 Pt BIB [facility] for malaise beginning today.  NO NVD,. Covid 1 week ago.  122/76 HR 60100% RA T97.8

## 2023-12-20 NOTE — ED Notes (Signed)
 Fall Huddle and Safety zone complete

## 2023-12-20 NOTE — ED Notes (Signed)
Patient transported to X-ray & CT °

## 2023-12-20 NOTE — ED Notes (Signed)
 Patient transported to CT

## 2023-12-20 NOTE — ED Notes (Signed)
 PT has slurred speech, I do not know if this is baseline.

## 2023-12-20 NOTE — ED Notes (Addendum)
 Pt attempted twice to provide urine sample, was not able to.

## 2023-12-20 NOTE — ED Notes (Signed)
 Pt is requiring multiple redirects not to stand up from wheelchair.  He says he doesn't want to fall and seems to understand but continues to try to stand up.

## 2023-12-20 NOTE — Discharge Instructions (Addendum)
 Your test results today were reassuring.  Use inhaler as needed for cough and shortness of breath.  A prescription for prednisone  was sent to your pharmacy.  Take this as prescribed for the next 4 days.  Return to the emergency department for any new or worsening symptoms of concern.

## 2023-12-20 NOTE — ED Triage Notes (Signed)
 Pt states he feels depressed about where he is staying. No SI/HI.

## 2023-12-21 ENCOUNTER — Other Ambulatory Visit: Payer: Self-pay

## 2024-01-02 ENCOUNTER — Other Ambulatory Visit: Payer: Self-pay
